# Patient Record
Sex: Female | Born: 1960
Health system: Southern US, Community
[De-identification: ages and names within clinical notes are randomized; demographics above are authoritative.]

## PROBLEM LIST (undated history)

## (undated) DIAGNOSIS — T7840XA Allergy, unspecified, initial encounter: Secondary | ICD-10-CM

## (undated) DIAGNOSIS — K589 Irritable bowel syndrome without diarrhea: Secondary | ICD-10-CM

## (undated) DIAGNOSIS — J3089 Other allergic rhinitis: Secondary | ICD-10-CM

## (undated) DIAGNOSIS — E039 Hypothyroidism, unspecified: Secondary | ICD-10-CM

## (undated) DIAGNOSIS — R002 Palpitations: Secondary | ICD-10-CM

## (undated) DIAGNOSIS — K219 Gastro-esophageal reflux disease without esophagitis: Secondary | ICD-10-CM

## (undated) DIAGNOSIS — E785 Hyperlipidemia, unspecified: Secondary | ICD-10-CM

## (undated) DIAGNOSIS — I2510 Atherosclerotic heart disease of native coronary artery without angina pectoris: Secondary | ICD-10-CM

## (undated) DIAGNOSIS — I10 Essential (primary) hypertension: Secondary | ICD-10-CM

## (undated) DIAGNOSIS — I201 Angina pectoris with documented spasm: Secondary | ICD-10-CM

## (undated) DIAGNOSIS — N83201 Unspecified ovarian cyst, right side: Secondary | ICD-10-CM

## (undated) HISTORY — DX: Hyperlipidemia, unspecified: E78.5

## (undated) HISTORY — PX: BREAST EXCISIONAL BIOPSY: SUR124

## (undated) HISTORY — DX: Palpitations: R00.2

## (undated) HISTORY — DX: Atherosclerotic heart disease of native coronary artery without angina pectoris: I25.10

## (undated) HISTORY — DX: Other allergic rhinitis: J30.89

## (undated) HISTORY — DX: Gastro-esophageal reflux disease without esophagitis: K21.9

## (undated) HISTORY — DX: Allergy, unspecified, initial encounter: T78.40XA

## (undated) HISTORY — PX: INTRACAPSULAR CATARACT EXTRACTION: SHX361

---
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
## 1980-08-30 HISTORY — PX: CHOLECYSTECTOMY: SHX55

## 2000-08-30 HISTORY — PX: BREAST SURGERY: SHX581

## 2008-09-23 LAB — HM PAP SMEAR: HM Pap smear: NEGATIVE

## 2009-08-30 HISTORY — PX: CARDIAC CATHETERIZATION: SHX172

## 2010-02-25 LAB — HM COLONOSCOPY: HM Colonoscopy: NORMAL

## 2010-03-27 LAB — HM MAMMOGRAPHY: HM Mammogram: NEGATIVE

## 2010-06-03 ENCOUNTER — Ambulatory Visit: Payer: Self-pay | Admitting: Cardiology

## 2010-06-03 ENCOUNTER — Encounter: Admission: RE | Admit: 2010-06-03 | Discharge: 2010-06-03 | Payer: Self-pay | Admitting: Cardiology

## 2010-10-14 ENCOUNTER — Ambulatory Visit (INDEPENDENT_AMBULATORY_CARE_PROVIDER_SITE_OTHER): Payer: PRIVATE HEALTH INSURANCE | Admitting: Cardiology

## 2010-10-14 DIAGNOSIS — R059 Cough, unspecified: Secondary | ICD-10-CM

## 2010-10-14 DIAGNOSIS — Z79899 Other long term (current) drug therapy: Secondary | ICD-10-CM

## 2010-10-14 DIAGNOSIS — R05 Cough: Secondary | ICD-10-CM

## 2010-10-14 DIAGNOSIS — E7800 Pure hypercholesterolemia, unspecified: Secondary | ICD-10-CM

## 2010-11-26 ENCOUNTER — Encounter: Payer: Self-pay | Admitting: Family Medicine

## 2010-11-26 ENCOUNTER — Ambulatory Visit (INDEPENDENT_AMBULATORY_CARE_PROVIDER_SITE_OTHER): Payer: PRIVATE HEALTH INSURANCE | Admitting: Family Medicine

## 2010-11-26 VITALS — BP 120/80 | HR 100 | Temp 98.6°F | Resp 12 | Ht 63.0 in | Wt 163.0 lb

## 2010-11-26 DIAGNOSIS — R1013 Epigastric pain: Secondary | ICD-10-CM

## 2010-11-26 NOTE — Progress Notes (Signed)
  Subjective:    Patient ID: Caroline [name], female    DOB: [birth year], 50 y.o.   MRN: [id number]  HPI New patient to establish care. The family just moved here from [place], [place] area. Past medical history significant for GERD and hyperlipidemia. She has already established with local cardiologist.  She had atypical type chest discomfort last summer and ended up with heart catheterization which showed no critical obstructive lesions. Initiated on Crestor but apparently had side effects. Recently placed on Lipitor. Recent lipids reportedly by cardiologist normal. Patient has ongoing nicotine use. Her mother reportedly had heart disease.  Patient complains of some chronic dyspepsia and intermittent midepigastric pains. She was placed on 2 concommitant proton pump inhibitors by previous primary care.  Has some continued midepigastric pain and substernal discomfort with omeprazole and Protonix. This is nonexertional. Location is substernal. No radiation. Symptoms occasionally exacerbated by spicy foods or acidic foods. She has not had any melena, appetite, or weight changes. No odynophagia or dysphagia. She gives some vague history of possible remote peptic ulcer many years ago. She's never had any gastrointestinal evaluation previously.  She has had previous cholecystectomy.  Patient complains of some intermittent myalgias. These preceded her being placed on statin medication. She has not had a complete physical in some time and needs to schedule.   Review of Systems  Constitutional: Positive for fatigue. Negative for fever, chills, activity change, appetite change and unexpected weight change.  Respiratory: Negative for cough, shortness of breath and wheezing.   Cardiovascular: Negative for chest pain.  Gastrointestinal: Positive for abdominal pain and abdominal distention. Negative for vomiting, diarrhea, constipation and blood in stool.  Genitourinary: Negative for dysuria.    Musculoskeletal: Negative for back pain.  Skin: Negative for rash.  Neurological: Negative for dizziness and syncope.  Psychiatric/Behavioral: Negative for dysphoric mood.       Objective:   Physical Exam  Constitutional: She is oriented to person, place, and time. She appears well-developed and well-nourished. No distress.  HENT:  Head: Normocephalic and atraumatic.  Right Ear: External ear normal.  Left Ear: External ear normal.  Mouth/Throat: No oropharyngeal exudate.  Eyes: Pupils are equal, round, and reactive to light.  Neck: Neck supple.  Cardiovascular: Normal rate, regular rhythm and normal heart sounds.   No murmur heard. Pulmonary/Chest: Effort normal and breath sounds normal. She has no wheezes. She has no rales.  Abdominal: Soft. Bowel sounds are normal. There is tenderness.       Minimal midepigastric tenderness to palpation. No hepatosplenomegaly noted no guarding or rebound. No masses.  Musculoskeletal: She exhibits no edema.  Lymphadenopathy:    She has no cervical adenopathy.  Neurological: She is alert and oriented to person, place, and time.  Skin: No rash noted.  Psychiatric: She has a normal mood and affect.          Assessment & Plan:  #1 chronic dyspepsia and intermittent midepigastric pains in a patient on 2 separate proton pump inhibitors. Recommend gastroenterology assessment. She does not take any nonsteroidals other than baby aspirin which she was apparently placed on by cardiologist #2 hyperlipidemia #3 history of reported GERD #4 ongoing nicotine use

## 2010-11-27 ENCOUNTER — Encounter (INDEPENDENT_AMBULATORY_CARE_PROVIDER_SITE_OTHER): Payer: Self-pay | Admitting: *Deleted

## 2010-12-01 NOTE — Letter (Signed)
Summary: New Patient letter  [facility]  [address]   [place], [place] [address]   Phone: [phone]  Fax: [phone]       11/27/2010 MRN: [id number]  [facility] [address] [place], [place]  [address]  [place]  Dear Ms. Osentoski,  Welcome to the Gastroenterology Division at [facility].    You are scheduled to see Dr.  Leone Payor on 12/03/2010 at 2:45 on the 3rd floor at [facility], [address]  We ask that you try to arrive at our office 15 minutes prior to your appointment time to allow for check-in.  We would like you to complete the enclosed self-administered evaluation form prior to your visit and bring it with you on the day of your appointment.  We will review it with you.  Also, please bring a complete list of all your medications or, if you prefer, bring the medication bottles and we will list them.  Please bring your insurance card so that we may make a copy of it.  If your insurance requires a referral to see a specialist, please bring your referral form from your primary care physician.  Co-payments are due at the time of your visit and may be paid by cash, check or credit card.     Your office visit will consist of a consult with your physician (includes a physical exam), any laboratory testing he/she may order, scheduling of any necessary diagnostic testing (e.g. x-ray, ultrasound, CT-scan), and scheduling of a procedure (e.g. Endoscopy, Colonoscopy) if required.  Please allow enough time on your schedule to allow for any/all of these possibilities.    If you cannot keep your appointment, please call [phone] to cancel or reschedule prior to your appointment date.  This allows [place] the opportunity to schedule an appointment for another patient in need of care.  If you do not cancel or reschedule by 5 p.m. the business day prior to your appointment date, you will be charged a $50.00 late cancellation/no-show fee.    Thank you for  choosing [facility] for your medical needs.  We appreciate the opportunity to care for you.  Please visit [place] at our website  to learn more about our practice.                     Sincerely,                                                             The Gastroenterology Division

## 2010-12-03 ENCOUNTER — Other Ambulatory Visit (INDEPENDENT_AMBULATORY_CARE_PROVIDER_SITE_OTHER): Payer: PRIVATE HEALTH INSURANCE

## 2010-12-03 ENCOUNTER — Ambulatory Visit (INDEPENDENT_AMBULATORY_CARE_PROVIDER_SITE_OTHER): Payer: PRIVATE HEALTH INSURANCE | Admitting: Internal Medicine

## 2010-12-03 ENCOUNTER — Encounter: Payer: Self-pay | Admitting: Internal Medicine

## 2010-12-03 VITALS — BP 112/68 | HR 100 | Ht 64.0 in | Wt 164.0 lb

## 2010-12-03 DIAGNOSIS — R1013 Epigastric pain: Secondary | ICD-10-CM

## 2010-12-03 DIAGNOSIS — R197 Diarrhea, unspecified: Secondary | ICD-10-CM

## 2010-12-03 LAB — CBC WITH DIFFERENTIAL/PLATELET
Basophils Relative: 0.6 % (ref 0.0–3.0)
Eosinophils Relative: 6 % — ABNORMAL HIGH (ref 0.0–5.0)
Hemoglobin: 14.9 g/dL (ref 12.0–15.0)
Lymphocytes Relative: 33.9 % (ref 12.0–46.0)
MCHC: 34.2 g/dL (ref 30.0–36.0)
Neutro Abs: 4.2 10*3/uL (ref 1.4–7.7)
Neutrophils Relative %: 51.5 % (ref 43.0–77.0)
RBC: 4.48 Mil/uL (ref 3.87–5.11)
WBC: 8.2 10*3/uL (ref 4.5–10.5)

## 2010-12-03 LAB — COMPREHENSIVE METABOLIC PANEL
ALT: 28 U/L (ref 0–35)
AST: 26 U/L (ref 0–37)
BUN: 20 mg/dL (ref 6–23)
CO2: 30 mEq/L (ref 19–32)
Calcium: 8.7 mg/dL (ref 8.4–10.5)
Chloride: 102 mEq/L (ref 96–112)
Creatinine, Ser: 0.7 mg/dL (ref 0.4–1.2)
GFR: 98.5 mL/min (ref >60.00)
Total Bilirubin: 0.7 mg/dL (ref 0.3–1.2)

## 2010-12-03 NOTE — Patient Instructions (Signed)
You have been scheduled for a CT scan of the abdomen. Separate instruction sheet given. Get your labs drawn today in the basement.  We will obtain records from [facility].

## 2010-12-04 ENCOUNTER — Encounter: Payer: Self-pay | Admitting: Internal Medicine

## 2010-12-04 DIAGNOSIS — R197 Diarrhea, unspecified: Secondary | ICD-10-CM | POA: Insufficient documentation

## 2010-12-04 DIAGNOSIS — R1013 Epigastric pain: Secondary | ICD-10-CM | POA: Insufficient documentation

## 2010-12-04 NOTE — Assessment & Plan Note (Signed)
This is not seen chronic but relatively transient. Alcohol could do this. She had some limited rectal bleeding after multiple loose stools. On rectal exam today there were no blue is no blood though there were some obvious external hemorrhoids seen. Suspect she had some hemorrhoidal bleeding. We will monitor the diarrhea and await the lab and CT studies and review the colonoscopy report.

## 2010-12-04 NOTE — Progress Notes (Signed)
50 year old married white woman with intermittent but persistent and now frequent epigastric pain for about a year. He describes a very intense pain that may occur after eating with an overall dull pain in the background. She has taken PPI therapy with pantoprazole and omeprazole taking twice a day and 3 times a day dosing regimens without relief. She previously lived in [place] and was seen by her PCP there and underwent cardiac workup with negative cardiac catheterization and was placed on lipid therapy. She has had persistent problems since moving to this area. She has never had an endoscopy that she relates a colonoscopy for screening in 2010 there was apparently unremarkable.  She also describes a fair amount of bloating and gas and belching but she is not vomiting. She thinks she might have IBS. Lately she's had some spells of loose stools and after a protracted episode started to see some rectal bleeding on the toilet paper and in the bowl. Ingestion of salads and some fatty foods will cause diarrhea. She notes no incontinence no weight loss. She is not describing classic heartburn necessarily is more of an epigastric pain as described above. He has used TUMS without relief as well. The remainder of her GI review of systems is negative. She uses Aleve perhaps a couple times a month but no regular NSAID or aspirin use outside the 81 mg dose of aspirin. Past Medical History  Diagnosis Date  . GERD (gastroesophageal reflux disease)   . Hyperlipidemia    Past Surgical History  Procedure Date  . Cholecystectomy 1982  . Appendectomy 1976  . Breast surgery 2002    biopsy  . Cardiac catheterization 2011    ok, in [place]    reports that she has been smoking Cigarettes.  She has a 15 pack-year smoking history. She has never used smokeless tobacco. She reports that she drinks alcohol. She reports that she does not use illicit drugs. family history includes Breast cancer (age of onset:36) in her  mother; Colon cancer in an unspecified family member; Diabetes in her mother; Heart disease in her father and mother; Hyperlipidemia in her father; and Prostate cancer in her father. No Known Allergies    Current outpatient prescriptions:aspirin 81 MG tablet, Take 81 mg by mouth daily.  , Disp: , Rfl: ;  atorvastatin (LIPITOR) 10 MG tablet, Take 10 mg by mouth daily.  , Disp: , Rfl: ;  naproxen sodium (ALEVE) 220 MG tablet, Take 220 mg by mouth. Once a week as needed for headaches and will take 400 mg , Disp: , Rfl: ;  omeprazole (PRILOSEC) 20 MG capsule, Take 20 mg by mouth daily. Will take once a day and sometimes twice, Disp: , Rfl:  pantoprazole (PROTONIX) 40 MG tablet, Take 40 mg by mouth daily.  , Disp: , Rfl:   ROS: Insomnia night sweats fatigue are noted all other systems negative  PE: Overweight middle-age white woman no acute distress Eyes are anicteric pupils round and reactive to light  mouth posterior pharynx are free of lesions Neck is supple without thyromegaly or mass Lungs are clear Heart S1-S2 no rubs murmurs or gallops are heard no jugular venous distention The abdomen is soft mildly obese bowel sounds are present, there is moderate tenderness with some guarding in the epigastric area without fullness or mass no hepatosplenomegaly or hernia detected Extremities free of edema no cyanosis or clubbing Skin warm and dry no acute rash Neuro alert and oriented x3 grossly nonfocal Psych talkative somewhat anxious perhaps  otherwise appropriate mood and affect   Lab Results  Component Value Date   WBC 8.2 12/03/2010   HGB 14.9 12/03/2010   HCT 43.6 12/03/2010   MCV 97.3 12/03/2010   PLT 209.0 12/03/2010   Lab Results  Component Value Date   AMYLASE 53 12/03/2010   Lab Results  Component Value Date   LIPASE 43.0 12/03/2010     Chemistry      Component Value Date/Time   NA 141 12/03/2010 1614   K 4.7 12/03/2010 1614   CL 102 12/03/2010 1614   CO2 30 12/03/2010 1614   BUN 20 12/03/2010  1614   CREATININE 0.7 12/03/2010 1614      Component Value Date/Time   CALCIUM 8.7 12/03/2010 1614   ALKPHOS 66 12/03/2010 1614   AST 26 12/03/2010 1614   ALT 28 12/03/2010 1614   BILITOT 0.7 12/03/2010 1614

## 2010-12-04 NOTE — Assessment & Plan Note (Signed)
One-year history of epigastric pain not responding to PPI therapy. Etiology not clear, but it does not seem to be cardiac. She is tender in the epigastrium as well. Given all that we'll start with CT abdomen and laboratory studies. My staff indicated the patient smelled of alcohol but I did not appreciate today. She reported 4-5 drinks per week. This in mind as we reviewed things. Depending upon what the CT shows, upper GI endoscopy could be needed. The patient is aware of this including the risks benefits indications.

## 2010-12-04 NOTE — Progress Notes (Signed)
Quick Note:  Labs are fine, will report when CT results in ______

## 2010-12-09 ENCOUNTER — Ambulatory Visit (INDEPENDENT_AMBULATORY_CARE_PROVIDER_SITE_OTHER)
Admission: RE | Admit: 2010-12-09 | Discharge: 2010-12-09 | Disposition: A | Discharge status: Home / Self Care | Payer: PRIVATE HEALTH INSURANCE | Source: Ambulatory Visit | Attending: Internal Medicine | Admitting: Internal Medicine

## 2010-12-09 DIAGNOSIS — R1013 Epigastric pain: Secondary | ICD-10-CM

## 2010-12-09 MED ORDER — IOHEXOL 300 MG/ML  SOLN
80.0000 mL | Freq: Once | INTRAMUSCULAR | Status: AC | PRN
  Administered 2010-12-09: 80 mL via INTRAVENOUS

## 2010-12-10 ENCOUNTER — Other Ambulatory Visit (INDEPENDENT_AMBULATORY_CARE_PROVIDER_SITE_OTHER): Payer: PRIVATE HEALTH INSURANCE | Admitting: Family Medicine

## 2010-12-10 DIAGNOSIS — Z0000 Encounter for general adult medical examination without abnormal findings: Secondary | ICD-10-CM

## 2010-12-10 LAB — LIPID PANEL
Cholesterol: 192 mg/dL (ref 0–200)
HDL: 80.3 mg/dL (ref >39.00)
LDL Cholesterol: 101 mg/dL — ABNORMAL HIGH (ref 0–99)
Total CHOL/HDL Ratio: 2
Triglycerides: 55 mg/dL (ref 0.0–149.0)
VLDL: 11 mg/dL (ref 0.0–40.0)

## 2010-12-10 LAB — POCT URINALYSIS DIPSTICK
Blood, UA: NEGATIVE
Nitrite, UA: NEGATIVE
Protein, UA: NEGATIVE
Spec Grav, UA: 1.02
Urobilinogen, UA: 0.2
pH, UA: 6.5

## 2010-12-11 ENCOUNTER — Telehealth: Payer: Self-pay | Admitting: Internal Medicine

## 2010-12-11 NOTE — Telephone Encounter (Signed)
Patient calling for CT scan results from 12/09/10.

## 2010-12-12 NOTE — Progress Notes (Signed)
Quick Note:  Ct is ok Will arrange egd See telephone note ______

## 2010-12-12 NOTE — Telephone Encounter (Signed)
Ct is normal Next step is egd for the epigastric pain

## 2010-12-14 ENCOUNTER — Other Ambulatory Visit: Payer: Self-pay | Admitting: *Deleted

## 2010-12-14 ENCOUNTER — Encounter: Payer: Self-pay | Admitting: Internal Medicine

## 2010-12-14 ENCOUNTER — Encounter: Payer: Self-pay | Admitting: *Deleted

## 2010-12-14 DIAGNOSIS — R1013 Epigastric pain: Secondary | ICD-10-CM

## 2010-12-14 NOTE — Telephone Encounter (Signed)
Patient notified of results. Scheduled EGD on 5/412 at 2:30 PM with 1:30 PM arrival. Pre visit scheduled on 12/22/10 at 8:30 AM.

## 2010-12-22 ENCOUNTER — Ambulatory Visit (AMBULATORY_SURGERY_CENTER): Payer: PRIVATE HEALTH INSURANCE | Admitting: *Deleted

## 2010-12-22 VITALS — Ht 64.5 in | Wt 167.6 lb

## 2010-12-22 DIAGNOSIS — R1013 Epigastric pain: Secondary | ICD-10-CM

## 2010-12-23 ENCOUNTER — Encounter: Payer: Self-pay | Admitting: Family Medicine

## 2010-12-23 ENCOUNTER — Ambulatory Visit (INDEPENDENT_AMBULATORY_CARE_PROVIDER_SITE_OTHER): Payer: PRIVATE HEALTH INSURANCE | Admitting: Family Medicine

## 2010-12-23 VITALS — BP 130/78 | HR 80 | Temp 97.9°F | Resp 12 | Ht 62.5 in | Wt 166.0 lb

## 2010-12-23 DIAGNOSIS — F17200 Nicotine dependence, unspecified, uncomplicated: Secondary | ICD-10-CM

## 2010-12-23 DIAGNOSIS — Z559 Problems related to education and literacy, unspecified: Secondary | ICD-10-CM

## 2010-12-23 DIAGNOSIS — Z0000 Encounter for general adult medical examination without abnormal findings: Secondary | ICD-10-CM

## 2010-12-23 MED ORDER — TETANUS-DIPHTH-ACELL PERTUSSIS 5-2.5-18.5 LF-MCG/0.5 IM SUSP: 0.5000 mL | Freq: Once | INTRAMUSCULAR | Status: DC

## 2010-12-23 NOTE — Patient Instructions (Signed)
Smoking Cessation This document explains the best ways for you to quit smoking and new treatments to help. It lists new medicines that can double or triple your chances of quitting and quitting for good. It also considers ways to avoid relapses and concerns you may have about quitting, including weight gain. NICOTINE: A POWERFUL ADDICTION If you have tried to quit smoking, you know how hard it can be. It is hard because nicotine is a very addictive drug. For some people, it can be as addictive as heroin or cocaine. Usually, people make 2 or 3 tries, or more, before finally being able to quit. Each time you try to quit, you can learn about what helps and what hurts. Quitting takes hard work and a lot of effort, but you can quit smoking. QUITTING SMOKING IS ONE OF THE MOST IMPORTANT THINGS YOU WILL EVER DO:  You will live longer, feel better, and live better.   The impact on your body of quitting smoking is felt almost immediately:   Within 20 minutes, blood pressure decreases. Pulse returns to its normal level.   After 8 hours, carbon monoxide levels in the blood return to normal. Oxygen level increases.   After 24 hours, chance of heart attack starts to decrease. Breath, hair, and body stop smelling like smoke.   After 48 hours, damaged nerve endings begin to recover. Sense of taste and smell improve.   After 72 hours, the body is virtually free of nicotine. Bronchial tubes relax and breathing becomes easier.   After 2 to 12 weeks, lungs can hold more air. Exercise becomes easier and circulation improves.   Quitting will lower your chance of having a heart attack, stroke, cancer, or lung disease:   After 1 year, the risk of coronary heart disease is cut in half.   After 5 years, the risk of stroke falls to the same as a nonsmoker.   After 10 years, the risk of lung cancer is cut in half and the risk of other cancers decreases significantly.   After 15 years, the risk of coronary heart  disease drops, usually to the level of a nonsmoker.   If you are pregnant, quitting smoking will improve your chances of having a healthy baby.   The people you live with, especially your children, will be healthier.   You will have extra money to spend on things other than cigarettes.  FIVE KEYS TO QUITTING Studies have shown that these 5 steps will help you quit smoking and quit for good. You have the best chances of quitting if you use them together: 1. Get ready.  2. Get support and encouragement.  3. Learn new skills and behaviors.  4. Get medicine to reduce your nicotine addiction and use it correctly.  5. Be prepared for relapse or difficult situations. Be determined to continue trying to quit, even if you do not succeed at first.  1. GET READY  Set a quit date.   Change your environment.   Get rid of ALL cigarettes, ashtrays, matches, and lighters in your home, car, and place of work.   Do not let people smoke in your home.   Review your past attempts to quit. Think about what worked and what did not.   Once you quit, do not smoke. NOT EVEN A PUFF!  2. GET SUPPORT AND ENCOURAGEMENT Studies have shown that you have a better chance of being successful if you have help. You can get support in many ways.  Tell  your family, friends, and coworkers that you are going to quit and need their support. Ask them not to smoke around you.   Talk to your caregivers (doctor, dentist, nurse, pharmacist, psychologist, and/or smoking counselor).   Get individual, group, or telephone counseling and support. The more counseling you have, the better your chances are of quitting. Programs are available at local hospitals and health centers. Call your local health department for information about programs in your area.   Spiritual beliefs and practices may help some smokers quit.   Quit meters are small computer programs online or downloadable that keep track of quit statistics, such as amount  of "quit-time," cigarettes not smoked, and money saved.   Many smokers find one or more of the many self-help books available useful in helping them quit and stay off tobacco.  3. LEARN NEW SKILLS AND BEHAVIORS  Try to distract yourself from urges to smoke. Talk to someone, go for a walk, or occupy your time with a task.   When you first try to quit, change your routine. Take a different route to work. Drink tea instead of coffee. Eat breakfast in a different place.   Do something to reduce your stress. Take a hot bath, exercise, or read a book.   Plan something enjoyable to do every day. Reward yourself for not smoking.   Explore interactive web-based programs that specialize in helping you quit.  4. GET MEDICINE AND USE IT CORRECTLY Medicines can help you stop smoking and decrease the urge to smoke. Combining medicine with the above behavioral methods and support can quadruple your chances of successfully quitting smoking. The U.S. Food and Drug Administration (FDA) has approved 7 medicines to help you quit smoking. These medicines fall into 3 categories.  Nicotine replacement therapy (delivers nicotine to your body without the negative effects and risks of smoking):   Nicotine gum: Available over-the-counter.   Nicotine lozenges: Available over-the-counter.   Nicotine inhaler: Available by prescription.   Nicotine nasal spray: Available by prescription.   Nicotine skin patches (transdermal): Available by prescription and over-the-counter.   Antidepressant medicine (helps people abstain from smoking, but how this works is unknown):   Bupropion sustained-release (SR) tablets: Available by prescription.   Nicotinic receptor partial agonist (simulates the effect of nicotine in your brain):   Varenicline tartrate tablets: Available by prescription.   Ask your caregiver for advice about which medicines to use and how to use them. Carefully read the information on the package.    Everyone who is trying to quit may benefit from using a medicine. If you are pregnant or trying to become pregnant, nursing an infant, you are under age 18, or you smoke fewer than 10 cigarettes per day, talk to your caregiver before taking any nicotine replacement medicines.   You should stop using a nicotine replacement product and call your caregiver if you experience nausea, dizziness, weakness, vomiting, fast or irregular heartbeat, mouth problems with the lozenge or gum, or redness or swelling of the skin around the patch that does not go away.   Do not use any other product containing nicotine while using a nicotine replacement product.   Talk to your caregiver before using these products if you have diabetes, heart disease, asthma, stomach ulcers, you had a recent heart attack, you have high blood pressure that is not controlled with medicine, a history of irregular heartbeat, or you have been prescribed medicine to help you quit smoking.  5. BE PREPARED FOR RELAPSE OR   DIFFICULT SITUATIONS  Most relapses occur within the first 3 months after quitting. Do not be discouraged if you start smoking again. Remember, most people try several times before they finally quit.   You may have symptoms of withdrawal because your body is used to nicotine. You may crave cigarettes, be irritable, feel very hungry, cough often, get headaches, or have difficulty concentrating.   The withdrawal symptoms are only temporary. They are strongest when you first quit, but they will go away within 10 to 14 days.  Here are some difficult situations to watch for:  Alcohol. Avoid drinking alcohol. Drinking lowers your chances of successfully quitting.   Caffeine. Try to reduce the amount of caffeine you consume. It also lowers your chances of successfully quitting.   Other smokers. Being around smoking can make you want to smoke. Avoid smokers.   Weight gain. Many smokers will gain weight when they quit, usually  less than 10 pounds. Eat a healthy diet and stay active. Do not let weight gain distract you from your main goal, quitting smoking. Some medicines that help you quit smoking may also help delay weight gain. You can always lose the weight gained after you quit.   Bad mood or depression. There are a lot of ways to improve your mood other than smoking.  If you are having problems with any of these situations, talk to your caregiver. SPECIAL SITUATIONS OR CONDITIONS Studies suggest that everyone can quit smoking. Your situation or condition can give you a special reason to quit.  Pregnant women/New mothers: By quitting, you protect your baby's health and your own.   Hospitalized patients: By quitting, you reduce health problems and help healing.   Heart attack patients: By quitting, you reduce your risk of a second heart attack.   Lung, head, and neck cancer patients: By quitting, you reduce your chance of a second cancer.   Parents of children and adolescents: By quitting, you protect your children from illnesses caused by secondhand smoke.  QUESTIONS TO THINK ABOUT Think about the following questions before you try to stop smoking. You may want to talk about your answers with your caregiver.  Why do you want to quit?   If you tried to quit in the past, what helped and what did not?   What will be the most difficult situations for you after you quit? How will you plan to handle them?   Who can help you through the tough times? Your family? Friends? Caregiver?   What pleasures do you get from smoking? What ways can you still get pleasure if you quit?  Here are some questions to ask your caregiver:  How can you help me to be successful at quitting?   What medicine do you think would be best for me and how should I take it?   What should I do if I need more help?   What is smoking withdrawal like? How can I get information on withdrawal?  Quitting takes hard work and a lot of effort,  but you can quit smoking. FOR MORE INFORMATION Smokefree.gov (http://www.davis-sullivan.com/) provides free, accurate, evidence-based information and professional assistance to help support the immediate and long-term needs of people trying to quit smoking. Document Released: 08/10/2001 Document Re-Released: 02/03/2010 [facility] Patient Information 2011 [place], [place].  Remember yearly mammogram and set up with Gyn for pap smear.

## 2010-12-23 NOTE — Progress Notes (Signed)
  Subjective:    Patient ID: Caroline [name], female    DOB: 02-Feb-1961, 50 y.o.   MRN: [id number]  HPI Pt here for CPE.  She plans to set up appt with gyn. Last tetanus unknown. Patient had colonoscopy last year. Has been scheduled for EGD given her continued epigastric pains off and on. Recent CT scan unremarkable. Patient still smokes but is trying to quit and has fairly high motivation for quitting   Review of Systems  Constitutional: Positive for appetite change. Negative for fever, activity change and fatigue.  HENT: Negative for hearing loss, ear pain, sore throat and trouble swallowing.   Eyes: Negative for visual disturbance.  Respiratory: Negative for cough and shortness of breath.   Cardiovascular: Negative for chest pain and palpitations.  Gastrointestinal: Positive for abdominal pain and blood in stool. Negative for nausea, vomiting, diarrhea, constipation and rectal pain.  Genitourinary: Negative for dysuria and hematuria.  Musculoskeletal: Negative for myalgias, back pain and arthralgias.  Skin: Negative for rash.  Neurological: Negative for dizziness, syncope and headaches.  Hematological: Negative for adenopathy.  Psychiatric/Behavioral: Negative for confusion and dysphoric mood.       Objective:   Physical Exam  Constitutional: She is oriented to person, place, and time. She appears well-developed and well-nourished.  HENT:  Head: Normocephalic and atraumatic.  Eyes: EOM are normal. Pupils are equal, round, and reactive to light.  Neck: Normal range of motion. Neck supple. No thyromegaly present.  Cardiovascular: Normal rate, regular rhythm and normal heart sounds.   No murmur heard. Pulmonary/Chest: Breath sounds normal. No respiratory distress. She has no wheezes. She has no rales.  Abdominal: Soft. Bowel sounds are normal. She exhibits no distension and no mass. There is no tenderness. There is no rebound and no guarding.  Musculoskeletal: Normal range of motion.  She exhibits no edema.  Lymphadenopathy:    She has no cervical adenopathy.  Neurological: She is alert and oriented to person, place, and time. She displays normal reflexes. No cranial nerve deficit.  Skin: No rash noted.  Psychiatric: She has a normal mood and affect. Her behavior is normal. Judgment and thought content normal.          Assessment & Plan:  Complete physical examination. Labs reviewed with patient. Tetanus booster given. She'll establish with local gynecologist. Continue yearly mammogram. Discussed smoking cessation strategies. She plans to quit on her own at this time.

## 2011-01-01 ENCOUNTER — Other Ambulatory Visit: Payer: PRIVATE HEALTH INSURANCE | Admitting: Internal Medicine

## 2011-01-08 ENCOUNTER — Encounter: Payer: Self-pay | Admitting: Internal Medicine

## 2011-01-11 ENCOUNTER — Ambulatory Visit (AMBULATORY_SURGERY_CENTER): Payer: PRIVATE HEALTH INSURANCE | Admitting: Internal Medicine

## 2011-01-11 ENCOUNTER — Encounter: Payer: Self-pay | Admitting: Internal Medicine

## 2011-01-11 DIAGNOSIS — D131 Benign neoplasm of stomach: Secondary | ICD-10-CM

## 2011-01-11 DIAGNOSIS — K209 Esophagitis, unspecified without bleeding: Secondary | ICD-10-CM

## 2011-01-11 DIAGNOSIS — R933 Abnormal findings on diagnostic imaging of other parts of digestive tract: Secondary | ICD-10-CM

## 2011-01-11 DIAGNOSIS — K219 Gastro-esophageal reflux disease without esophagitis: Secondary | ICD-10-CM

## 2011-01-11 DIAGNOSIS — K2970 Gastritis, unspecified, without bleeding: Secondary | ICD-10-CM

## 2011-01-11 DIAGNOSIS — R1013 Epigastric pain: Secondary | ICD-10-CM

## 2011-01-11 HISTORY — PX: UPPER GASTROINTESTINAL ENDOSCOPY: SHX188

## 2011-01-11 MED ORDER — SODIUM CHLORIDE 0.9 % IV SOLN: 500.0000 mL | INTRAVENOUS | Status: DC

## 2011-01-11 MED ORDER — DEXLANSOPRAZOLE 60 MG PO CPDR: 60.0000 mg | DELAYED_RELEASE_CAPSULE | Freq: Every day | ORAL | Status: DC

## 2011-01-11 NOTE — Patient Instructions (Signed)
[employer] reviewed with patient and care partner.  [employer] discharge instructions signed by care partner.  Impressions/Findings:  Esophagitis (handouts given)  Gastritis (handouts given)  Stop pantoprazole and Prilosec and try Dexilant 60 mg every am 30 minutes before breakfast.

## 2011-01-12 ENCOUNTER — Telehealth: Payer: Self-pay

## 2011-01-12 NOTE — Telephone Encounter (Signed)
I called the pt's home # which was the same # for her cell #.  No message left, no ID on the recording. MAW

## 2011-01-19 NOTE — Progress Notes (Signed)
Quick Note:  Stay on Dexilant for GERD (esophagitis but no gastritis on bxs) and see me July 2012 No recall Letter created ______

## 2011-01-20 ENCOUNTER — Telehealth: Payer: Self-pay | Admitting: Internal Medicine

## 2011-01-20 NOTE — Telephone Encounter (Signed)
Patient says Dexilant is too expensive.  I have sent her some discount cards in the mail.  She should be able to get for a $20 co pay.  She will call back for any further problems.

## 2011-05-01 HISTORY — PX: CARDIAC CATHETERIZATION: SHX172

## 2011-05-13 ENCOUNTER — Emergency Department (HOSPITAL_COMMUNITY): Payer: PRIVATE HEALTH INSURANCE

## 2011-05-13 ENCOUNTER — Observation Stay (HOSPITAL_COMMUNITY)
Admission: EM | Admit: 2011-05-13 | Discharge: 2011-05-14 | Disposition: A | Discharge status: Home / Self Care | Payer: PRIVATE HEALTH INSURANCE | Source: Ambulatory Visit | Attending: Cardiology | Admitting: Cardiology

## 2011-05-13 DIAGNOSIS — I2510 Atherosclerotic heart disease of native coronary artery without angina pectoris: Principal | ICD-10-CM | POA: Insufficient documentation

## 2011-05-13 DIAGNOSIS — I200 Unstable angina: Secondary | ICD-10-CM | POA: Insufficient documentation

## 2011-05-13 DIAGNOSIS — R110 Nausea: Secondary | ICD-10-CM | POA: Insufficient documentation

## 2011-05-13 DIAGNOSIS — R079 Chest pain, unspecified: Secondary | ICD-10-CM

## 2011-05-13 DIAGNOSIS — R61 Generalized hyperhidrosis: Secondary | ICD-10-CM | POA: Insufficient documentation

## 2011-05-13 DIAGNOSIS — E785 Hyperlipidemia, unspecified: Secondary | ICD-10-CM | POA: Insufficient documentation

## 2011-05-13 DIAGNOSIS — F17200 Nicotine dependence, unspecified, uncomplicated: Secondary | ICD-10-CM | POA: Insufficient documentation

## 2011-05-13 DIAGNOSIS — R0602 Shortness of breath: Secondary | ICD-10-CM | POA: Insufficient documentation

## 2011-05-13 LAB — CBC
Hemoglobin: 15.1 g/dL — ABNORMAL HIGH (ref 12.0–15.0)
MCH: 34.2 pg — ABNORMAL HIGH (ref 26.0–34.0)
MCH: 33.5 pg (ref 26.0–34.0)
MCHC: 35.3 g/dL (ref 30.0–36.0)
MCV: 95.1 fL (ref 78.0–100.0)
MCV: 94.9 fL (ref 78.0–100.0)
Platelets: 170 10*3/uL (ref 150–400)
Platelets: 167 10*3/uL (ref 150–400)
RBC: 4.51 MIL/uL (ref 3.87–5.11)
RDW: 13.1 % (ref 11.5–15.5)

## 2011-05-13 LAB — DIFFERENTIAL
Eosinophils Relative: 11 % — ABNORMAL HIGH (ref 0–5)
Lymphocytes Relative: 49 % — ABNORMAL HIGH (ref 12–46)
Neutro Abs: 2.2 10*3/uL (ref 1.7–7.7)

## 2011-05-13 LAB — HEMOGLOBIN A1C
Hgb A1c MFr Bld: 5.7 % — ABNORMAL HIGH (ref <5.7)
Mean Plasma Glucose: 117 mg/dL — ABNORMAL HIGH (ref <117)

## 2011-05-13 LAB — CARDIAC PANEL(CRET KIN+CKTOT+MB+TROPI)
CK, MB: 1.6 ng/mL (ref 0.3–4.0)
Total CK: 86 U/L (ref 7–177)
Troponin I: <0.3 ng/mL (ref <0.30)

## 2011-05-13 LAB — LIPID PANEL
Cholesterol: 201 mg/dL — ABNORMAL HIGH (ref 0–200)
HDL: 69 mg/dL (ref >39)
Total CHOL/HDL Ratio: 2.9 RATIO
VLDL: 18 mg/dL (ref 0–40)

## 2011-05-13 LAB — COMPREHENSIVE METABOLIC PANEL
ALT: 25 U/L (ref 0–35)
CO2: 27 mEq/L (ref 19–32)
Calcium: 9.2 mg/dL (ref 8.4–10.5)
Creatinine, Ser: 0.66 mg/dL (ref 0.50–1.10)
GFR calc Af Amer: >60 mL/min (ref >60)
GFR calc non Af Amer: >60 mL/min (ref >60)
Glucose, Bld: 116 mg/dL — ABNORMAL HIGH (ref 70–99)

## 2011-05-13 LAB — MAGNESIUM: Magnesium: 2 mg/dL (ref 1.5–2.5)

## 2011-05-14 DIAGNOSIS — I200 Unstable angina: Secondary | ICD-10-CM

## 2011-05-14 LAB — CBC
HCT: 38.9 % (ref 36.0–46.0)
Hemoglobin: 13.5 g/dL (ref 12.0–15.0)
MCH: 33.5 pg (ref 26.0–34.0)
MCV: 96.5 fL (ref 78.0–100.0)
RBC: 4.03 MIL/uL (ref 3.87–5.11)

## 2011-05-14 LAB — BASIC METABOLIC PANEL
BUN: 13 mg/dL (ref 6–23)
CO2: 25 mEq/L (ref 19–32)
Calcium: 8.1 mg/dL — ABNORMAL LOW (ref 8.4–10.5)
Creatinine, Ser: 0.66 mg/dL (ref 0.50–1.10)
Glucose, Bld: 93 mg/dL (ref 70–99)
Sodium: 139 mEq/L (ref 135–145)

## 2011-05-15 NOTE — H&P (Signed)
NAME:  Caroline [name] NO.:  [id number]  MEDICAL RECORD NO.:  [id number]  LOCATION:  [facility]                         FACILITY:  [facility]  PHYSICIAN:  Lenon Oms, MD  DATE OF BIRTH:  [birth year]  DATE OF ADMISSION:  05/13/2011 DATE OF DISCHARGE:                             HISTORY & PHYSICAL   CARDIOLOGIST:  Cassell Clement, M.D.  CHIEF COMPLAINT:  Chest pain.  HISTORY OF PRESENT ILLNESS:  Caroline [name] is a 50 year old Caucasian female with past medical history significant for nonobstructive coronary artery disease who presents to the emergency department with a chief complaint of chest pain.  The patient stated that her chest pain began suddenly around 10:00 p.m. at night while getting ready for bed.  The patient reported her pain was a sharp 8-9/10 midsternal pain radiating into her left arm and shoulder.  Chest pain was associated with shortness of breath, diaphoresis, and nausea.  The patient did have nitroglycerin.  However, did not take any at home.  As her symptoms did not improve and was significantly worse than prior episodes of chest pain, she had her husband bring her into the emergency department for further evaluation and management.  She otherwise reports that she has had recent dyspnea on exertion with walking up stairs over the last several weeks, and she did have an appointment to follow-up with Dr. Patty Sermons in July of this year; however, did miss that appointment.  She does continue to smoke tobacco apparently.  PAST MEDICAL HISTORY:  Reported coronary angiography in July 2011 at [facility] by Dr.  Angeline Slim.  I do not have report of this at this time. No angioplasty or stenting was done at that time.  ALLERGIES:  NO KNOWN DRUG ALLERGIES.  MEDICATIONS:  The patient reports that she currently takes: 1. Aspirin 81 mg daily. 2. Lipitor 40 mg daily.  SOCIAL HISTORY:  The patient lives in [place] with her husband and two children.   She works in [employer].  She smokes one half- pack per day for the past 30 years.  She does drink six alcoholic drinks a week.  She denies any illicit drug use.  FAMILY HISTORY:  Her mother died at 27, she had COPD.  Father had coronary disease and CABG.  Her siblings, has a brother who is 43, who had his first coronary disease at 35.  REVIEW OF SYSTEMS:  Other than stated in HPI, all other systems are reviewed and is negative.  PHYSICAL EXAMINATION:  VITAL SIGNS: Temperature 97.8, blood pressure 112/76, pulse 89, respiratory rate 16,  the patient was 97% on room air. GENERAL: She is a pleasant Caucasian female in no acute distress. HEENT: Normocephalic, atraumatic.  Pupils equally round and reactive to light.  Extraocular movements intact.  Anicteric sclera.  NECK:  Supple. No JVD. LUNGS:  Clear to auscultation bilaterally. HEART:  Regular rate and rhythm.  Normal S1-S2. ABDOMEN:  Normoactive bowel sounds, soft, nontender, nondistended. NEUROLOGIC:  Awake, alert, oriented x3.  No focal deficits.  RADIOLOGY:  The patient had a chest x-ray, which revealed bibasilar interstitial prominence without focal consolidation or effusion or pneumothorax.    EKG revealed sinus rhythm with heart  rate of 83, cannot rule out anterior septal infarct.  LABORATORY DATA:  The patient had a CBC profile, which revealed a white count of 7.4, hemoglobin of 16.1, hematocrit of 44.8, platelets of 170. Comprehensive metabolic profile revealed a sodium of 141, potassium 3.6, BUN of 4, creatinine is 0.66, AST is 24, ALT of 25, alk. phos is 75. Troponin 0.  ASSESSMENT/PLAN:  This is a 50 year old Caucasian female with past medical history significant for reported nonobstructive coronary artery disease who presents to emergency department with a chief complaint of chest pain x1 days.  At this time, the patient has a negative troponin. We will admit to telemetry unit and we will monitor serial  cardiac enzymes and rule out for acute myocardial infarction.  We will make n.p.o. at midnight.  Medical management will include aspirin 325 of beta- blocker, statin, and nitro p.r.n.  We will check her lipids, hemoglobin A1c, and TSH.  I have counseled regarding tobacco cessation.  At this time, I did not have report of her prior cardiac catheterization done 1 year prior, however, the patient reports that Dr. Patty Sermons does have these records and is familiar with the patient.  Further management likely to be determined after reassessment in the a.m.  I have discussed this plan in detail with the patient.  I have answered all her questions at this time.          ______________________________ Lenon Oms, MD     PB/MEDQ  D:  05/13/2011  T:  05/13/2011  Job:  [id number]  Electronically Signed by Lenon Oms MD on 05/15/2011 06:01:34 PM

## 2011-05-17 ENCOUNTER — Ambulatory Visit (INDEPENDENT_AMBULATORY_CARE_PROVIDER_SITE_OTHER): Payer: PRIVATE HEALTH INSURANCE | Admitting: Family Medicine

## 2011-05-17 ENCOUNTER — Encounter: Payer: Self-pay | Admitting: Family Medicine

## 2011-05-17 VITALS — BP 140/70 | Temp 98.1°F | Wt 166.0 lb

## 2011-05-17 DIAGNOSIS — E785 Hyperlipidemia, unspecified: Secondary | ICD-10-CM

## 2011-05-17 DIAGNOSIS — H60549 Acute eczematoid otitis externa, unspecified ear: Secondary | ICD-10-CM

## 2011-05-17 DIAGNOSIS — H60509 Unspecified acute noninfective otitis externa, unspecified ear: Secondary | ICD-10-CM

## 2011-05-17 DIAGNOSIS — R079 Chest pain, unspecified: Secondary | ICD-10-CM

## 2011-05-17 MED ORDER — MOMETASONE FUROATE 0.1 % EX SOLN: Freq: Every day | CUTANEOUS | Status: DC

## 2011-05-17 NOTE — Progress Notes (Signed)
  Subjective:    Patient ID: Caroline [name], female    DOB: 1961/06/12, 50 y.o.   MRN: [id number]  HPI Patient seen for hospital followup. She was admitted last Wednesday with chest discomfort. She related substernal chest pressure and had nausea shortness of breath and radiation of pain to the left arm. Symptoms lasted about 15 minutes. Previous catheterization last year in [facility] which showed some plaque but no critical obstructive lesions. Patient ruled out for MI. Her catheterization reportedly did not show any progressive lesions. Consideration of possible coronary vasospasm. Addition of diltiazem and isosorbide.  No chest pain at this time. She has had some headaches since discharge but these are relatively mild. She also complains of some intermittent right hand numbness and no weakness. No neck pain. Mild ache right knee but no leg swelling and no hematoma from catheterization.    Intermittent eczematous rash right external ear canal. Pruritic. No drainage. No hearing changes. Has not tried any prescription medications   Review of Systems  Constitutional: Negative for appetite change and unexpected weight change.  Respiratory: Negative for cough and shortness of breath.   Cardiovascular: Negative for chest pain, palpitations and leg swelling.  Gastrointestinal: Negative for abdominal pain.  Neurological: Positive for headaches. Negative for dizziness and weakness.       Objective:   Physical Exam  Constitutional: She is oriented to person, place, and time. She appears well-developed and well-nourished.  HENT:  Mouth/Throat: Oropharynx is clear and moist.       Left external ear canal is clear. She has dry scaly eczematous rash right canal. Eardrums are normal  Eyes: Pupils are equal, round, and reactive to light.  Neck: Neck supple. No thyromegaly present.  Cardiovascular: Normal rate and regular rhythm.   Pulmonary/Chest: Effort normal and breath sounds normal. No  respiratory distress. She has no wheezes. She has no rales.  Musculoskeletal: She exhibits no edema.  Lymphadenopathy:    She has no cervical adenopathy.  Neurological: She is alert and oriented to person, place, and time.  Psychiatric: She has a normal mood and affect. Her behavior is normal.          Assessment & Plan:  #1 recent chest pain with catheterization. Question of coronary vasospasm. Patient has mild headache with isosorbide and we explained this may reduce after a few days. She has scheduled followup with cardiologist.  She will notify cardiologist if headache not improving next few days. #2 hyperlipidemia. Lipitor increased to 20 mg during hospitalization. Schedule followup lipids and hepatic in 2 months #3 eczema right ear canal. Elocon lotion once daily as needed

## 2011-05-21 NOTE — Discharge Summary (Signed)
NAME:  Caroline [name], CLECKLEY NO.:  [id number]  MEDICAL RECORD NO.:  [id number]  LOCATION:  3731                         FACILITY:  [facility]  PHYSICIAN:  Cassell Clement, M.D. DATE OF BIRTH:  10/05/1960  DATE OF ADMISSION:  05/13/2011 DATE OF DISCHARGE:  05/14/2011                              DISCHARGE SUMMARY   PRIMARY CARDIOLOGIST:  Cassell Clement, MD  PRIMARY CARE PROVIDER:  Evelena Peat, MD  DISCHARGE DIAGNOSIS:  Unstable angina with documented coronary artery vasospasm.  SECONDARY DIAGNOSES: 1. Nonobstructive coronary artery disease by catheterization this     admission. 2. Hyperlipidemia. 3. Ongoing tobacco abuse.  ALLERGIES:  No known drug allergies.  PROCEDURES:  Left heart cardiac catheterization initially revealing 70% proximal stenosis in the LAD, 80% proximal stenosis in the left circumflex, 70%-80% proximal stenosis in the first diagonal, 30% calcified stenoses in the right coronary artery with an EF of 60%.  The patient was treated with intracoronary nitroglycerin with reduction of proximal LAD stenosis to 40%, and reduction of proximal circumflex stenosis to 30%, no change in the diagonal or right coronary artery stenoses.  The patient was placed on medical management for coronary arterial vasospasm.  HISTORY OF PRESENT ILLNESS:  A 50 year old female with prior history of nonobstructive CAD and chest pains with catheterization performed in [facility] in July 2011, revealing nonobstructive disease.  The patient was in her usual state of health until approximately 10 p.m. on the evening of May 12, 2011, when while getting ready for bed, she had sharp 8- 9/10 midsternal chest pain radiating to her left arm and shoulder associated with dyspnea, diaphoresis, and nausea.  Once symptoms did not improve immediately, she was taken to the ED by her husband where she was treated with nitrates and made pain free.  The patient also reported some  exertional chest discomfort over the past few weeks.  She was admitted for evaluation.  HOSPITAL COURSE:  The patient ruled out for MI and her ECG remained nonacute.  Given symptoms, decision was made to pursue diagnostic cardiac catheterization which took place on May 13, 2011, initially revealing moderate obstructive disease involving the LAD diagonal and circumflex and nonobstructive calcified disease in the proximal mid right coronary artery.  EF was greater than 60%.  The patient was treated with intracoronary nitroglycerin resulting in reduction of proximal LAD and circumflex stenoses.  In the setting of documented coronary artery vasospasm, medical therapy was recommended and oral nitrates and calcium channel blocker were initiated.  Following catheterization, the patient was ambulating without recurrent symptoms or limitations.  We plan to discharge her home today in good condition and she has been counseled on importance of smoking cessation.  DISCHARGE LABORATORY DATA:  Hemoglobin 13.5, hematocrit 38.9, WBC 9.0, platelets 140.  Sodium 139, potassium 2.4, chloride 107, CO2 of 25, BUN 13, creatinine 0.66, glucose 93.  Total bilirubin 0.2, alkaline phosphatase 75, AST 24, ALT 25, total protein 7.8, albumin 4.4, calcium 8.1, magnesium 2.0.  Hemoglobin A1c 5.7, CK 81, MB 1.4, troponin I less than 0.30, total cholesterol 201, triglycerides 88, HDL 69, LDL 114.  DISPOSITION:  The patient will be discharged home today in good condition.  FOLLOWUP PLANS  AND APPOINTMENTS:  The patient will follow up with Norma Fredrickson, nurse practitioner on May 28, 2011, at 10:30 a.m.  The patient will follow up with Dr. Caryl Never as previously scheduled.  DISCHARGE MEDICATIONS: 1. Diltiazem CD 120 mg daily. 2. Imdur 30 mg daily. 3. Nitroglycerin 0.4 mg subcu p.r.n. chest pain. 4. Aspirin 81 mg daily. 5. Lipitor 20 mg at bedtime.  OUTSTANDING LABS AND STUDIES:  None.  DURATION OF  DISCHARGE ENCOUNTER:  Thirty five minutes including physician time.     [name], ANP   ______________________________ Cassell Clement, M.D.    CB/MEDQ  D:  05/14/2011  T:  05/14/2011  Job:  [id number]  cc:   Evelena Peat, M.D.  Electronically Signed by [name] ANP on 05/20/2011 03:50:08 PM Electronically Signed by Cassell Clement M.D. on 05/21/2011 12:52:53 PM

## 2011-05-28 ENCOUNTER — Encounter: Payer: Self-pay | Admitting: Nurse Practitioner

## 2011-05-28 ENCOUNTER — Ambulatory Visit (INDEPENDENT_AMBULATORY_CARE_PROVIDER_SITE_OTHER): Payer: PRIVATE HEALTH INSURANCE | Admitting: Nurse Practitioner

## 2011-05-28 DIAGNOSIS — F17200 Nicotine dependence, unspecified, uncomplicated: Secondary | ICD-10-CM

## 2011-05-28 DIAGNOSIS — I2510 Atherosclerotic heart disease of native coronary artery without angina pectoris: Secondary | ICD-10-CM | POA: Insufficient documentation

## 2011-05-28 DIAGNOSIS — Z720 Tobacco use: Secondary | ICD-10-CM

## 2011-05-28 NOTE — Progress Notes (Signed)
[name] Date of Birth: 03/09/1961   History of Present Illness: Caroline [name] is seen back today for a post hospital visit. She is seen for Dr. Patty Sermons. She has had a bout of angina and had cardiac cath. She had documented vasospasm. She was given IC NTG and still had CAD but to a lesser degree. She is managed medically. She has stopped smoking. She has developed a URI and has been using pseudophed. No recurrent chest pain. No problems with her groin.   Current Outpatient Prescriptions on File Prior to Visit  Medication Sig Dispense Refill  . aspirin 81 MG tablet Take 81 mg by mouth daily.        Marland Kitchen atorvastatin (LIPITOR) 20 MG tablet Take 20 mg by mouth daily.        Marland Kitchen diltiazem (CARDIZEM) 120 MG tablet Take 120 mg by mouth daily.        . isosorbide dinitrate (ISORDIL) 30 MG tablet Take 30 mg by mouth daily.        . naproxen sodium (ALEVE) 220 MG tablet Take 220 mg by mouth. Once a week as needed for headaches and will take 400 mg       . nitroGLYCERIN (NITROSTAT) 0.4 MG SL tablet Place 0.4 mg under the tongue every 5 (five) minutes as needed.        Marland Kitchen dexlansoprazole (DEXILANT) 60 MG capsule Take 1 capsule (60 mg total) by mouth daily.  30 capsule  2   Current Facility-Administered Medications on File Prior to Visit  Medication Dose Route Frequency Provider Last Rate Last Dose  . DISCONTD: 0.9 %  sodium chloride infusion  500 mL Intravenous Continuous Iva Boop, MD        No Known Allergies  Past Medical History  Diagnosis Date  . GERD (gastroesophageal reflux disease)   . Hyperlipidemia   . Hx of cholecystectomy   . Hx of appendectomy   . CAD (coronary artery disease)     has documented coronary vasospasm but with underlying 40% prox LAD, 30% prox LCX and 70 to 80% proximal 1st DX and 30% RCA  . S/P cardiac cath Sept 2012    Past Surgical History  Procedure Date  . Cholecystectomy 1982  . Appendectomy 1976  . Breast surgery 2002    biopsy  . Cardiac  catheterization 2011  . Upper gastrointestinal endoscopy 01/11/2011    ?esophagitis, gastritis  . Cardiac catheterization Sept 2012    With documented vasospasm but with residual disease; managed medically    History  Smoking status  . Former Smoker -- 0.2 packs/day for 30 years  . Types: Cigarettes  . Quit date: 05/12/2011  Smokeless tobacco  . Never Used    History  Alcohol Use  . 1.0 oz/week  . 2 drink(s) per week    4-5 per week     Family History  Problem Relation Age of Onset  . Breast cancer Mother 48  . Heart disease Mother   . Diabetes Mother   . Prostate cancer Father   . Hyperlipidemia Father   . Heart disease Father   . Colon cancer      First cousin on dads side     Review of Systems: The review of systems is positive for recent URI that is resolving. No chest pain She has had some mild headache with her nitrate therapy but it is improving.  All other systems were reviewed and are negative.  Physical Exam: BP 106/70  Pulse 84  Ht 5' 4.5" (1.638 m)  Wt 160 lb 12.8 oz (72.938 kg)  BMI 27.17 kg/m2 Patient is very pleasant and in no acute distress. Skin is warm and dry. Color is normal.  HEENT is unremarkable. Normocephalic/atraumatic. PERRL. Sclera are nonicteric. Neck is supple. No masses. No JVD. Lungs are clear. Cardiac exam shows a regular rate and rhythm. Abdomen is soft. Extremities are without edema. Gait and ROM are intact. No gross neurologic deficits noted.   LABORATORY DATA:   Assessment / Plan:

## 2011-05-28 NOTE — Patient Instructions (Signed)
Congratulations for not smoking No psuedophed! Go and get Coricidin HBP for any cold symptoms.  Plain Mucinex will help with your cough OK to exercise. Goal is 45 minutes per day. I want to see you in about 6 weeks.

## 2011-05-28 NOTE — Assessment & Plan Note (Signed)
She has stopped smoking as of September 12th. 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

## 2011-05-28 NOTE — Assessment & Plan Note (Signed)
She has had recent cath that demonstrated vasospasm. She was given IC NTG. She has residual CAD and is managed medically. She has stopped smoking. I have asked her to stop the pseudophed and use Coricidin HBP instead for cold symptoms. I will see her back in about 6 weeks. Regular exercise is encouraged. Patient is agreeable to this plan and will call if any problems develop in the interim.

## 2011-06-01 ENCOUNTER — Observation Stay (HOSPITAL_COMMUNITY)
Admission: EM | Admit: 2011-06-01 | Discharge: 2011-06-02 | Disposition: A | Discharge status: Home / Self Care | Payer: PRIVATE HEALTH INSURANCE | Source: Ambulatory Visit | Attending: Cardiology | Admitting: Cardiology

## 2011-06-01 ENCOUNTER — Emergency Department (HOSPITAL_COMMUNITY): Payer: PRIVATE HEALTH INSURANCE

## 2011-06-01 DIAGNOSIS — R0602 Shortness of breath: Secondary | ICD-10-CM | POA: Insufficient documentation

## 2011-06-01 DIAGNOSIS — I2510 Atherosclerotic heart disease of native coronary artery without angina pectoris: Secondary | ICD-10-CM | POA: Insufficient documentation

## 2011-06-01 DIAGNOSIS — R079 Chest pain, unspecified: Secondary | ICD-10-CM

## 2011-06-01 DIAGNOSIS — E785 Hyperlipidemia, unspecified: Secondary | ICD-10-CM | POA: Insufficient documentation

## 2011-06-01 DIAGNOSIS — Z87891 Personal history of nicotine dependence: Secondary | ICD-10-CM | POA: Insufficient documentation

## 2011-06-01 LAB — CBC
HCT: 44.6 % (ref 36.0–46.0)
Hemoglobin: 16 g/dL — ABNORMAL HIGH (ref 12.0–15.0)
MCV: 95.5 fL (ref 78.0–100.0)
Platelets: 197 10*3/uL (ref 150–400)
RBC: 4.67 MIL/uL (ref 3.87–5.11)
WBC: 8.3 10*3/uL (ref 4.0–10.5)

## 2011-06-01 LAB — DIFFERENTIAL
Basophils Relative: 1 % (ref 0–1)
Monocytes Absolute: 0.6 10*3/uL (ref 0.1–1.0)
Monocytes Relative: 7 % (ref 3–12)
Neutro Abs: 4.4 10*3/uL (ref 1.7–7.7)

## 2011-06-01 LAB — HEPATIC FUNCTION PANEL
AST: 20 U/L (ref 0–37)
Albumin: 4 g/dL (ref 3.5–5.2)
Alkaline Phosphatase: 73 U/L (ref 39–117)
Total Protein: 7.3 g/dL (ref 6.0–8.3)

## 2011-06-01 LAB — CK TOTAL AND CKMB (NOT AT ARMC)
CK, MB: 2 ng/mL (ref 0.3–4.0)
Total CK: 84 U/L (ref 7–177)

## 2011-06-01 LAB — POCT I-STAT, CHEM 8
BUN: 19 mg/dL (ref 6–23)
Calcium, Ion: 1.15 mmol/L (ref 1.12–1.32)
Chloride: 104 mEq/L (ref 96–112)
Creatinine, Ser: 0.7 mg/dL (ref 0.50–1.10)
Glucose, Bld: 110 mg/dL — ABNORMAL HIGH (ref 70–99)

## 2011-06-01 LAB — POCT I-STAT TROPONIN I: Troponin i, poc: 0 ng/mL (ref 0.00–0.08)

## 2011-06-01 LAB — TROPONIN I: Troponin I: <0.3 ng/mL (ref <0.30)

## 2011-06-02 LAB — CBC
HCT: 43.9 % (ref 36.0–46.0)
Platelets: 182 10*3/uL (ref 150–400)
RDW: 13.3 % (ref 11.5–15.5)
WBC: 6.3 10*3/uL (ref 4.0–10.5)

## 2011-06-02 LAB — BASIC METABOLIC PANEL
BUN: 15 mg/dL (ref 6–23)
Creatinine, Ser: 0.76 mg/dL (ref 0.50–1.10)
GFR calc Af Amer: >90 mL/min (ref >90)
GFR calc non Af Amer: >90 mL/min (ref >90)

## 2011-06-02 LAB — CARDIAC PANEL(CRET KIN+CKTOT+MB+TROPI)
CK, MB: 1.8 ng/mL (ref 0.3–4.0)
Troponin I: <0.3 ng/mL (ref <0.30)

## 2011-06-02 NOTE — H&P (Addendum)
NAMETENEA, SENS NO.:  [id number]  MEDICAL RECORD NO.:  [id number]  LOCATION:  2037                         FACILITY:  [facility]  PHYSICIAN:  Arturo Morton. Riley Kill, MD, FACCDATE OF BIRTH:  16-Dec-1960  DATE OF ADMISSION:  06/01/2011 DATE OF DISCHARGE:                             HISTORY & PHYSICAL   PRIMARY CARDIOLOGIST:  Cassell Clement, MD  PRIMARY MEDICAL DOCTOR:  Evelena Peat, MD  CHIEF COMPLAINT:  Chest pain.  HISTORY OF PRESENT ILLNESS:  Ms. Oetken is a 50 year old lady with a history of hyperlipidemia, tobacco abuse who recently quit, and documented coronary vasospasm by cath May 13, 2011, who presents with chest pain.  She was at work making coffee, when she felt to abrupt stabbing chest pains, back to back, followed by cold sweat, questionable sensation, palpitations, and nausea.  Her coworker brought her purse. At that point in time, she was no longer having chest pain since it lasted only seconds.  She took one sublingual nitro under tongue and began shaking.  She may have had some shortness of breath as well, reported as mild.  She sent from the ER, she has had two episodes where she has felt palpitations, shortness of breath, and feeling somewhat disoriented.  No loss of consciousness.  One of the episodes, she called for the nurse who noted no altered mental status but did happen to see that on telemetry the patient's heart rate was up in the 90s.  The second time it showed on x-ray.  She is currently feeling well without symptoms.  Troponin is negative x1 and EKG is without acute changes from prior.  PAST MEDICAL HISTORY: 1. Nonobstructive CAD by cath May 13, 2011, but with documented     coronary vasospasm in the setting of unstable angina. 2. A catheterization initially revealed 70% proximal LAD, 80%     circumflex, 70%-80% first diagonal, 30% RCA.  After IV     nitroglycerin, she had a reduction and LAD stenosis of 40%,    reduction and circumflex stenosis 30%, and first diagonal RCA     remain the same. 3. Hyperlipidemia. 4. Tobacco abuse. 5. Normal LV function by cath May 13, 2011.  SURGICAL HISTORY:  Appendectomy, cholecystectomy.  MEDICATIONS: 1. Diltiazem 120 mg daily. 2. Imdur 30 mg daily. 3. Nitro sublingual p.r.n. 4. Aspirin 81 mg daily. 5. Lipitor 20 mg daily.  ALLERGIES:  No known drug allergies.  SOCIAL HISTORY:  Ms. Rossa lives in [place].  She is married with 2 children.  She works for [employer].  She has a positive history of tobacco abuse, but quit after her last admission.  She was congragulated on that.  She drinks approximately 6 alcohol drinks per week.  She denies any illicit drug use.  FAMILY HISTORY:  Mother is died at 66 of COPD.  Father had CAD and CABG. She has one brother who is 39 who was diagnosed with CAD of 70.  REVIEW OF SYSTEMS:  No fevers, chills, edema, cough, syncope, melena, hematemesis.  All other system has been, otherwise negative.  LABORATORY DATA:  WBC 8.3, hemoglobin 16, hematocrit 44, platelet count 197.  Sodium 137, potassium 4.2, chloride  104, glucose 110, BUN 19, creatinine 0.7.  Troponin negative x1.  EKG:  Normal sinus rhythm without acute changes.  T-wave inversion aVL, unchanged from prior.  RADIOLOGIC STUDIES:  Chest x-ray showed stable mild chronic lung disease.  No acute cardiopulmonary disease.  PHYSICAL EXAMINATION:  VITAL SIGNS:  Temperature 98.1, pulse 81, respirations 16, blood pressure 126/80, 99% on room air. GENERAL: A pleasant white female, in no acute distress, who is comfortable. HEENT:  Normocephalic, atraumatic with extraocular movements intact. Clear sclerae.  Nares are without discharge. NECK:  Supple without carotid bruit or JVD. HEART:  Auscultation of the heart reveals regular rate and rhythm with S1-S2 without murmurs, rubs, or gallops.  LUNGS: Auscultation bilaterally without wheezes,  rales or rhonchi. ABDOMEN:  Soft, nontender, and nondistended.  Positive bowel sounds. EXTREMITIES: Warm, dry and without edema. NEUROLOGIC:  She is alert and oriented x3, responds questions appropriately with a normal affect.  ASSESSMENT:  The patient was seen and examined by Dr. Riley Kill and myself.  This is a 50 year old lady with a history of prior tobacco abuse, nonobstructive coronary artery disease with documented coronary vasospasm by cath May 13, 2011, who presents with 2 brief episodes of stabbing chest pain associated diaphoresis, nausea.  She has also had episodes of elevated heart rate, palpitations, sensation of disorientation while in the ED.  Telemetry has revealed only increase in his heart rates to the 90s, sinus rhythm with no ventricular arrhythmias.  EKG is unchanged and prior enzymes were negative x1.  At that time, we will admit the patient to the hospital and cycle cardiac enzymes.  We will continue her current home medicines, with the exception of increasing her diltiazem in the event that this is coronary vasospasm.  We will check a D-dimer given her recent procedure and if this is positive, we will proceed with CT angio. We will give her clear liquid breakfast in the morning, then n.p.o. after midnight, Dr. Patty Sermons was seen her in the morning to determine if further invasive studies are needed.     Dayna Dunn, P.A.C.   ______________________________ Arturo Morton Riley Kill, MD, [facility]    DD/MEDQ  D:  06/01/2011  T:  06/02/2011  Job:  [id number]  cc:   Cassell Clement, M.D. Evelena Peat, M.D.  Electronically Signed by Ronie Spies  on 06/02/2011 09:32:53 PM Electronically Signed by Shawnie Pons MD [facility] on 06/10/2011 05:42:35 AM

## 2011-06-09 NOTE — Discharge Summary (Signed)
NAMEMarland Kitchen  KARRAH, MANGINI NO.:  [id number]  MEDICAL RECORD NO.:  [id number]  LOCATION:  2037                         FACILITY:  [facility]  PHYSICIAN:  Cassell Clement, M.D. DATE OF BIRTH:  07/13/1961  DATE OF ADMISSION:  06/01/2011 DATE OF DISCHARGE:  06/02/2011                              DISCHARGE SUMMARY   PRIMARY CARDIOLOGIST:  Cassell Clement, MD  PRIMARY CARE PROVIDER:  Evelena Peat, MD  DISCHARGE DIAGNOSIS:  Chest pain ruling out for myocardial infarction this admission, felt to be secondary to coronary spasms, which were documented on May 13, 2011 versus esophageal spasms.  SECONDARY DIAGNOSES: 1. Nonobstructive coronary artery disease by cardiac catheterization     on May 13, 2011. 2. Hyperlipidemia. 3. Recent tobacco cessation.  ALLERGIES:  No known drug allergies.  PROCEDURE/DIAGNOSTICS PERFORMED DURING HOSPITALIZATION: 1. Chest x-ray on June 01, 2011:  Stable mild chronic lung disease     with no acute cardiopulmonary process. 2. EKG showing normal sinus rhythm without acute changes.  HISTORY OF PRESENT ILLNESS:  This is a 50 year old female with the above- stated problem list and is specifically nonobstructive coronary artery disease by cardiac catheterization on May 13, 2011 with documented coronary vasospasm, presented to the emergency department with acute onset of stabbing chest pain followed by cold sweat as well as nausea. Chest pain lasted only seconds.  After this episode, she took a sublingual nitroglycerin where she began shaking and had right complaint of palpitations and questionable disorientation, but no loss consciousness.  In the emergency department, the patient's troponin was negative x1 and EKG was without acute changes.  The patient was admitted for further observation.  HOSPITAL COURSE:  The patient was admitted to the telemetry unit where she had no further complaints of chest pain.  Her rhythm  remained stable without episodes on telemetry.  She ruled out for myocardial infarction with cardiac enzymes being negative x3.  The D-dimer was checked to rule out for pulmonary embolus and this was negative.  This patient has documented coronary vasospasm, her diltiazem was increased during hospitalization, she tolerated this well.  She will be continued on this at discharge.  Dr. Patty Sermons felt the patient's chest pain after ruling out for myocardial infarction was secondary to her known coronary spasm versus gastroesophageal reflux disease versus esophageal spasm; therefore, he added empiric omeprazole, a prescription has been given. She has had some complaints of cough.  Therefore, we have also recommended trying Mucinex.  On day of discharge, Dr. Patty Sermons evaluated the patient and noted her stable for home.  She was able to ambulate in the halls without difficulty.  Again, she had no further complaints of chest pain.  She had ruled out for myocardial infarction.  DISCHARGE LABS:  Cardiac enzymes negative x3.  Sodium 139, potassium 4.3, BUN 15, creatinine 0.76.  Hemoglobin 15, hematocrit 43.9.  DISCHARGE MEDICATIONS: 1. Diltiazem 120 mg CD/ER two tablets daily. 2. Omeprazole 40 mg one tablet daily. 3. Guaifenesin 600 mg one tablet twice daily. 4. Aspirin 81 mg one tablet daily. 5. Imdur 30 mg one tablet daily. 6. Lipitor 20 mg one tablet daily. 7. Nitroglycerin sublingual 0.4 mg one tablet under the tongue as  the     onset of chest pain, may repeat every 5 minutes up to three doses     as needed.  FOLLOWUP PLANS AND INSTRUCTIONS: 1. The patient will follow up with Norma Fredrickson, nurse practitioner     for Dr. Patty Sermons on July 07, 2011 at 3:30 p.m. 2. The patient is to call the office in the interim for any problems     or concerns. 3. The patient is to increase activities as tolerated. 4. The patient is to continue low-sodium and heart-healthy diet. 5. The patient is  to stop any acute that causes chest pain or     shortness of breath.  DURATION OF DISCHARGE:  Greater than 30 minutes with physician and physician extender time.     Leonette Monarch, PA-C   ______________________________ Cassell Clement, M.D.    NB/MEDQ  D:  06/02/2011  T:  06/02/2011  Job:  [id number]  cc:   Evelena Peat, M.D. Cassell Clement, M.D.  Electronically Signed by Alen Blew P.A. on 06/03/2011 03:11:28 PM Electronically Signed by Cassell Clement M.D. on 06/09/2011 09:00:48 AM

## 2011-06-10 NOTE — Cardiovascular Report (Signed)
[name], [name] NO.:  [id number]  MEDICAL RECORD NO.:  [id number]  LOCATION:  3731                         FACILITY:  [facility]  PHYSICIAN:  Arturo Morton. Riley Kill, MD, FACCDATE OF BIRTH:  Sep 05, 1960  DATE OF PROCEDURE:  05/13/2011 DATE OF DISCHARGE:                           CARDIAC CATHETERIZATION   INDICATIONS:  Caroline [name] was admitted with chest pain.  The patient has a prior history of undergoing intravascular ultrasound for proximal LAD disease in [place] about a year ago.  Her enzymes are negative and she has no electrocardiographic abnormalities, but her symptoms are worrisome for unstable angina.  The patient is a smoker.  She was seen by Dr. Shirlee Latch and then Dr. Patty Sermons who recommended cardiac catheterization.  We discussed the risks with her in the catheterization laboratory.  PROCEDURE: 1. Left heart catheterization. 2. Selective coronary arteriography. 3. Selective left ventriculography. 4. Intracoronary nitroglycerin administration.  DESCRIPTION OF PROCEDURE:  The patient was brought to the cath lab and prepped and draped in usual fashion.  Allen test was attempted on several occasions but they could never really get a good signal question due to spasm.  Following this, we elected to use the femoral area.  The right femoral artery was used and 4-French sheath was utilized.  Views of the left and right coronary arteries were obtained in multiple angiographic projections.  Intracoronary nitroglycerin was administered into the left coronary artery, there was a dramatic change based on the intracoronary administration.  Following this, central aortic and left ventricular pressures were measured with pigtail and ventriculography was performed in the RAO projection.  I then showed the pictures to the patient.  I subsequently showed them to her husband and reviewed them in detail.  She tolerated the procedure well and there were no  major complications.  She was taken to the holding area for sheath removal.  HEMODYNAMIC FINDINGS: 1. Central aortic pressure 125/75, mean 97. 2. LV pressure 127/17. 3. No gradient or pullback across the aortic valve.  ANGIOGRAPHIC DATA: 1. It is of note that there was marked change in vessel diameter after     the intracoronary administration of 100 and 200 mcg of     nitroglycerin. 2. There is diffuse calcification of coronary vessels. 3. Left ventriculography in the RAO projection reveals preserved     global systolic function with ejection fraction in excess of 60%. 4. The left main is free of critical disease. 5. The LAD has about 70% ostial narrowing best seen both in the LAO     cranials and the LAO caudal views.  Importantly, after the     administration of intracoronary nitroglycerin, there was dramatic     increase in the vessel diameter coursing all the way down to the     apex.  There did remain an eccentric shelf of plaque, and this is     consistent with the underlying calcium, however, in most views it     did not appear to be high-grade or significant.  There is a     diagonal branch which is modest in size which has an ostial lesion     which remains after the administration of  nitroglycerin. 6. The circumflex is a very large-caliber vessel.  Importantly, there     was a change in diameter throughout the case.  The proximal portion     demonstrated up to 80% narrowing in the LAO caudal views.  After     intracoronary nitroglycerin administration, there was dramatic     increase in the vessel diameter throughout.  There was less than     30% narrowing after the administration of nitroglycerin. 7. The right coronary artery was injected after the intracoronary     nitroglycerin have been given into the left system.  The right     coronary artery was a large-caliber vessel with some calcified     plaque.  There were tandem areas of about 30% narrowing but no high-      grade areas of focal stenosis.  There was an RV branch, acute     marginal branch, posterior descending, and posterolateral system     and the vessel distribution is fairly large.  CONCLUSIONS: 1. Preserved left ventricular systolic function. 2. Diffuse coronary calcification. 3. High-grade stenoses of the ostium of the left anterior descending     artery and proximal circumflex vessel which were relieved     substantially with intracoronary nitroglycerin administration. 4. Continued residual high-grade disease of the small to moderate     diagonal branch.  DISPOSITION:  I reviewed the films with the patient and subsequently with her husband.  She absolutely must stop smoking.  We will add nitrates and calcium channel antagonists to her regimen and continue her aspirin as well as statins.  I have called Dr. Patty Sermons, and we will continue to keep her in the hospital perhaps for another 36 hours in which she can be stabilized.  She absolutely must stop smoking, however.     Arturo Morton. Riley Kill, MD, [facility]     TDS/MEDQ  D:  05/13/2011  T:  05/14/2011  Job:  [id number]  cc:   CV Laboratory Cassell Clement, M.D.  Electronically Signed by Shawnie Pons MD [facility] on 06/10/2011 05:42:32 AM

## 2011-06-24 ENCOUNTER — Telehealth: Payer: Self-pay | Admitting: Cardiology

## 2011-06-24 MED ORDER — DILTIAZEM HCL 120 MG PO TABS: 120.0000 mg | ORAL_TABLET | Freq: Two times a day (BID) | ORAL | Status: DC

## 2011-06-24 NOTE — Telephone Encounter (Signed)
This is Dr Yevonne Pax patient. I will forward message to [facility].

## 2011-06-24 NOTE — Telephone Encounter (Signed)
Sent Rx to pharmacy.  Did review last hospital discharge and diltiazem was increased to twice daily

## 2011-06-24 NOTE — Telephone Encounter (Signed)
Pt's diltiazem was increased from 120 mg to 220mg  so is taking twice as many, when she went to refill med she had to pay full price because refill was not due yet, filled yesterday, so will need authorization for new refills with new dosage called to [facility] and would like refills

## 2011-07-07 ENCOUNTER — Ambulatory Visit: Payer: PRIVATE HEALTH INSURANCE | Admitting: Nurse Practitioner

## 2011-07-10 ENCOUNTER — Telehealth: Payer: Self-pay | Admitting: Adult Health

## 2011-07-10 NOTE — Telephone Encounter (Signed)
agree with advice given

## 2011-07-10 NOTE — Telephone Encounter (Signed)
Patient is confused about dosing of medication. She is on Cardizem CD 120 mg BID instead of taking it as 2 tablets once daily.  She is feeling bad with BID dosing. I have recommended that she take 2 tablets in the am as this has been better tolerated for her.

## 2011-07-12 ENCOUNTER — Ambulatory Visit (INDEPENDENT_AMBULATORY_CARE_PROVIDER_SITE_OTHER)
Admission: RE | Admit: 2011-07-12 | Discharge: 2011-07-12 | Disposition: A | Discharge status: Home / Self Care | Payer: PRIVATE HEALTH INSURANCE | Source: Ambulatory Visit | Attending: Nurse Practitioner | Admitting: Nurse Practitioner

## 2011-07-12 ENCOUNTER — Encounter: Payer: Self-pay | Admitting: Nurse Practitioner

## 2011-07-12 ENCOUNTER — Telehealth: Payer: Self-pay | Admitting: *Deleted

## 2011-07-12 ENCOUNTER — Telehealth: Payer: Self-pay | Admitting: Cardiology

## 2011-07-12 ENCOUNTER — Ambulatory Visit (INDEPENDENT_AMBULATORY_CARE_PROVIDER_SITE_OTHER): Payer: PRIVATE HEALTH INSURANCE | Admitting: Nurse Practitioner

## 2011-07-12 DIAGNOSIS — I2510 Atherosclerotic heart disease of native coronary artery without angina pectoris: Secondary | ICD-10-CM

## 2011-07-12 DIAGNOSIS — R002 Palpitations: Secondary | ICD-10-CM

## 2011-07-12 DIAGNOSIS — F17200 Nicotine dependence, unspecified, uncomplicated: Secondary | ICD-10-CM

## 2011-07-12 DIAGNOSIS — Z720 Tobacco use: Secondary | ICD-10-CM

## 2011-07-12 DIAGNOSIS — M549 Dorsalgia, unspecified: Secondary | ICD-10-CM

## 2011-07-12 DIAGNOSIS — R079 Chest pain, unspecified: Secondary | ICD-10-CM

## 2011-07-12 MED ORDER — METOPROLOL TARTRATE 25 MG PO TABS: 25.0000 mg | ORAL_TABLET | Freq: Two times a day (BID) | ORAL | Status: DC

## 2011-07-12 MED ORDER — IOHEXOL 300 MG/ML  SOLN
80.0000 mL | Freq: Once | INTRAMUSCULAR | Status: AC | PRN
  Administered 2011-07-12: 80 mL via INTRAVENOUS

## 2011-07-12 NOTE — Telephone Encounter (Signed)
Patient states her heart has bee racing, sweating , shills, dizzy, also a feeling sensation like she is going to pass out. She does not know if it is her medication. Diltiazem dose was changed to 120 mg twice a day instead of taken the medication 2 tablet in the AM, like she was taken it before.

## 2011-07-12 NOTE — Assessment & Plan Note (Signed)
She does have nonobstructive CAD with documented vasospasm. Her medicines are adjusted today. Lopressor 25 mg BID was added. She is not smoking.

## 2011-07-12 NOTE — Progress Notes (Signed)
Caroline [name] Date of Birth: [birth year] Medical Record #[id number]  History of Present Illness: Caroline [name] is seen today for a work in visit. She is seen for Dr. Patty Sermons. She has had recent cardiac cath back in September of this year for chest pain. Had documented vasospasm. Was given IC NTG during the cath and had residual CAD but to a lesser degree. She is managed medically.   She returns today for a work in appointment. Since her last visit, she has been back to the hospital for an overnight stay at the first of October. It was felt that her symptoms were either from coronary vasospasm or esophageal spasm. Her Cardizem was doubled and Omeprazole was added.   She is now having spells of back pain radiating to her left arm that makes her feel presyncopal and sweaty. She has had multiple spells over the past 3 days. It occurs with and without exertion. She feels like her heart is racing. These spells make her feel sweaty. She is very anxious and upset. Blood pressure remains up at home. She is not smoking.   Current Outpatient Prescriptions on File Prior to Visit  Medication Sig Dispense Refill  . aspirin 81 MG tablet Take 81 mg by mouth daily.        Marland Kitchen atorvastatin (LIPITOR) 20 MG tablet Take 20 mg by mouth daily.        Marland Kitchen dexlansoprazole (DEXILANT) 60 MG capsule Take 60 mg by mouth daily.        Marland Kitchen diltiazem (CARDIZEM) 120 MG tablet Take 1 tablet (120 mg total) by mouth 2 (two) times daily.  180 tablet  3  . isosorbide dinitrate (ISORDIL) 30 MG tablet Take 30 mg by mouth daily.        . naproxen sodium (ALEVE) 220 MG tablet Take 220 mg by mouth. Once a week as needed for headaches and will take 400 mg       . nitroGLYCERIN (NITROSTAT) 0.4 MG SL tablet Place 0.4 mg under the tongue every 5 (five) minutes as needed.        Marland Kitchen dexlansoprazole (DEXILANT) 60 MG capsule Take 1 capsule (60 mg total) by mouth daily.  30 capsule  2   No current facility-administered medications on file prior to visit.      No Known Allergies  Past Medical History  Diagnosis Date  . GERD (gastroesophageal reflux disease)   . Hyperlipidemia   . Hx of cholecystectomy   . Hx of appendectomy   . CAD (coronary artery disease)     has documented coronary vasospasm but with underlying 40% prox LAD, 30% prox LCX and 70 to 80% proximal 1st DX and 30% RCA  . S/P cardiac cath Sept 2012    has CAD with documented vasospasm. CAD was nonobstructive.     Past Surgical History  Procedure Date  . Cholecystectomy 1982  . Appendectomy 1976  . Breast surgery 2002    biopsy  . Cardiac catheterization 2011  . Upper gastrointestinal endoscopy 01/11/2011    ?esophagitis, gastritis  . Cardiac catheterization Sept 2012    With documented vasospasm but with residual disease; managed medically    History  Smoking status  . Former Smoker -- 0.2 packs/day for 30 years  . Types: Cigarettes  . Quit date: 05/12/2011  Smokeless tobacco  . Never Used    History  Alcohol Use  . 1.0 oz/week  . 2 drink(s) per week    4-5 per week  Family History  Problem Relation Age of Onset  . Breast cancer Mother 35  . Heart disease Mother   . Diabetes Mother   . Prostate cancer Father   . Hyperlipidemia Father   . Heart disease Father   . Colon cancer      First cousin on dads side     Review of Systems: The review of systems is per the HPI.  All other systems were reviewed and are negative.  Physical Exam: BP 158/88  Pulse 75  Ht 5\' 4"  (1.626 m)  Wt 168 lb 12.8 oz (76.567 kg)  BMI 28.97 kg/m2 Patient is quite anxious, tearful but in no acute distress. Skin is warm and dry. Color is normal.  HEENT is unremarkable. Normocephalic/atraumatic. PERRL. Sclera are nonicteric. Neck is supple. No masses. No JVD. Lungs are clear. Cardiac exam shows a regular rate and rhythm. Abdomen is soft. Extremities are without edema. Gait and ROM are intact. No gross neurologic deficits noted.   LABORATORY DATA: EKG shows sinus  rhythm, 1st degree AV block, septal Q's. Tracing is unchanged from prior EKG and is reviewed with Dr. Patty Sermons.   Assessment / Plan:

## 2011-07-12 NOTE — Telephone Encounter (Signed)
Advised of results

## 2011-07-12 NOTE — Telephone Encounter (Signed)
Message copied by Burnell Blanks on Mon Jul 12, 2011  6:15 PM ------      Message from: Rosalio Macadamia      Created: Mon Jul 12, 2011  4:20 PM       Ok to report. CT is ok. She has a 24 hour monitor in place. Lopressor 25 mg BID was added at her visit today.

## 2011-07-12 NOTE — Patient Instructions (Signed)
We are going to put a monitor on you to look at your rhythm for the next 24 hours.  We are going to get a CT scan of your chest   We are adding Lopressor 25 mg two times a day to help with your blood pressure, heart racing and blood pressure.   Keep next appointment.

## 2011-07-12 NOTE — Assessment & Plan Note (Signed)
Her case is reviewed with Dr. Patty Sermons. We will proceed on with CT angiogram to rule out dissection as well as other abnormalities. We will place a 24 Holter to rule out arrhythmia as the etiology for her presyncope. Lopressor 25 mg BID is added to her current regimen. Will need to monitor her since she does have this first degree AV block. She is reassured. We will see her back at her regular appointment later this month. Patient is agreeable to this plan and will call if any problems develop in the interim.

## 2011-07-12 NOTE — Assessment & Plan Note (Signed)
She is not smoking. She is congratulated.  

## 2011-07-12 NOTE — Telephone Encounter (Signed)
Spoke with patient and she has been scheduled to see Lawson Fiscal NP today

## 2011-07-12 NOTE — Telephone Encounter (Signed)
Pt calling re having heart racing, "PINCHING" FEELING IN CHEST, SWEATING, FEELS SENSATION OF POSSIBLY GOING TO PASS OUT

## 2011-07-13 ENCOUNTER — Ambulatory Visit: Payer: PRIVATE HEALTH INSURANCE | Admitting: Nurse Practitioner

## 2011-07-20 ENCOUNTER — Other Ambulatory Visit: Payer: PRIVATE HEALTH INSURANCE

## 2011-07-21 ENCOUNTER — Telehealth: Payer: Self-pay | Admitting: *Deleted

## 2011-07-21 ENCOUNTER — Other Ambulatory Visit: Payer: Self-pay | Admitting: Cardiology

## 2011-07-21 NOTE — Telephone Encounter (Signed)
Refilled generic lipitor 

## 2011-07-21 NOTE — Telephone Encounter (Signed)
Adv patient of monitor results

## 2011-07-28 ENCOUNTER — Encounter: Payer: Self-pay | Admitting: Nurse Practitioner

## 2011-07-28 ENCOUNTER — Ambulatory Visit (INDEPENDENT_AMBULATORY_CARE_PROVIDER_SITE_OTHER): Payer: PRIVATE HEALTH INSURANCE | Admitting: Family Medicine

## 2011-07-28 ENCOUNTER — Ambulatory Visit (INDEPENDENT_AMBULATORY_CARE_PROVIDER_SITE_OTHER): Payer: PRIVATE HEALTH INSURANCE | Admitting: Nurse Practitioner

## 2011-07-28 ENCOUNTER — Encounter: Payer: Self-pay | Admitting: Family Medicine

## 2011-07-28 VITALS — BP 112/70 | HR 70 | Ht 64.0 in | Wt 169.8 lb

## 2011-07-28 DIAGNOSIS — R079 Chest pain, unspecified: Secondary | ICD-10-CM

## 2011-07-28 DIAGNOSIS — F17200 Nicotine dependence, unspecified, uncomplicated: Secondary | ICD-10-CM

## 2011-07-28 DIAGNOSIS — I2510 Atherosclerotic heart disease of native coronary artery without angina pectoris: Secondary | ICD-10-CM

## 2011-07-28 DIAGNOSIS — Z720 Tobacco use: Secondary | ICD-10-CM

## 2011-07-28 DIAGNOSIS — Z8719 Personal history of other diseases of the digestive system: Secondary | ICD-10-CM | POA: Insufficient documentation

## 2011-07-28 DIAGNOSIS — R000 Tachycardia, unspecified: Secondary | ICD-10-CM

## 2011-07-28 NOTE — Patient Instructions (Signed)
Use only plain Mucinex or Coricidin HBP for your cold symptoms.  We will see you back in 4 months.  Here are my tips to lose weight:  1. Drink only water. You do not need milk, juice, tea, soda or diet soda.  2. Do not eat anything "white". This includes white bread, potatoes, rice or mayo  3. Stay away from fried foods and sweets  4. Your portion should be the size of the palm of your hand.  5. Know what your weaknesses are and avoid.  6. Find an exercise you like and do it every day for 45 to 60 minutes.  Call the [facility] office at [phone] if you have any questions, problems or concerns.

## 2011-07-28 NOTE — Assessment & Plan Note (Addendum)
She is doing very well. No change in her medicines. She is not smoking. We will see her back in 4 months. She is encouraged to work on her diet and weight.  Patient is agreeable to this plan and will call if any problems develop in the interim.

## 2011-07-28 NOTE — Progress Notes (Signed)
Subjective:    Patient ID: Caroline [name], female    DOB: [birth year], 50 y.o.   MRN: [id number]  HPI  Medical followup. Seen by cardiology earlier today. Recent atypical chest pain and palpitations. Patient has had 2 recent hospital evaluations and extensive workup. Back in September cardiac catheterization with no critical coronary obstruction. Subsequently admitted in October with recurrent chest pain and CT scan revealing no aneurysm. Holter monitor no significant arrhythmias. Addition of low-dose beta blocker and no further palpitations since then.  History of gastritis and esophagitis by EGD last in May. Has been on Protonix and omeprazole but still has some intermittent epigastric discomfort and reflux symptoms. Has previously been on Dexilant which seemed to be more effective but she had problems with insurance coverage. No dysphagia. No appetite or weight changes. No exertional chest pain. Recently quit smoking.  Tries to stick with bland diet.  Desires to lose weight. Has made some dietary changes but no consistent exercise. Recent labs done for additional health insurance and she is asked that those be forwarded to [place].    Past Medical History  Diagnosis Date  . GERD (gastroesophageal reflux disease)   . Hyperlipidemia   . Hx of cholecystectomy   . Hx of appendectomy   . CAD (coronary artery disease)     has documented coronary vasospasm but with underlying 40% prox LAD, 30% prox LCX and 70 to 80% proximal 1st DX and 30% RCA  . S/P cardiac cath Sept 2012    has CAD with documented vasospasm. CAD was nonobstructive.   . Palpitations     tachycardia on holter; treated with beta blockers   Past Surgical History  Procedure Date  . Cholecystectomy 1982  . Appendectomy 1976  . Breast surgery 2002    biopsy  . Cardiac catheterization 2011  . Upper gastrointestinal endoscopy 01/11/2011    ?esophagitis, gastritis  . Cardiac catheterization Sept 2012    With documented vasospasm but  with residual disease; managed medically    reports that she quit smoking about 2 months ago. Her smoking use included Cigarettes. She has a 7.5 pack-year smoking history. She has never used smokeless tobacco. She reports that she drinks about one ounce of alcohol per week. She reports that she does not use illicit drugs. family history includes Breast cancer (age of onset:36) in her mother; Colon cancer in an unspecified family member; Diabetes in her mother; Heart disease in her father and mother; Hyperlipidemia in her father; and Prostate cancer in her father. No Known Allergies    Review of Systems  Constitutional: Negative for appetite change and unexpected weight change.  HENT: Negative for trouble swallowing.   Respiratory: Negative for cough and shortness of breath.   Cardiovascular: Negative for chest pain, palpitations and leg swelling.  Gastrointestinal: Negative for nausea, vomiting, diarrhea and blood in stool.  Neurological: Negative for weakness.       Objective:   Physical Exam  Constitutional: She appears well-developed and well-nourished. No distress.  Neck: Neck supple. No thyromegaly present.  Cardiovascular: Normal rate, regular rhythm and normal heart sounds.   Pulmonary/Chest: Effort normal and breath sounds normal. No respiratory distress. She has no wheezes. She has no rales.  Abdominal: Soft. She exhibits no distension and no mass. There is no rebound and no guarding.       Minimally tender epigastric region  Lymphadenopathy:    She has no cervical adenopathy.          Assessment & Plan:  #  1 history of GERD and gastritis. ? Whether this may have contributed to recent atypical chest pain symptoms. Get back on Dexilant with samples given and if not any additional improvement or resolution of symptoms in one month followup with gastroenterology. Dietary factors discussed. She uses minimal caffeine. No consistent alcohol. #2 recent chest pain resolved with  extensive workup as above. Palpitations improved with Lopressor #3 history of nicotine use. Still smokes occasional cigarette. She is encouraged to quit altogether

## 2011-07-28 NOTE — Assessment & Plan Note (Signed)
No further episodes since the addition of Lopressor.

## 2011-07-28 NOTE — Progress Notes (Signed)
[name] Date of Birth: 11-Nov-1960 Medical Record #[id number]  History of Present Illness: Caroline [name] is seen back today for a follow up visit. It is a 6 week visit. She is seen for Dr. Patty Sermons. She had recent cardiac cath in September and had documented vasospasm. With IC NTG her spasm was resolved but she does have residual CAD but to a lesser degree. She is managed medically.   6 weeks ago she had had back pain and palpitations. We did a CT here in the office which was negative for dissection. A holter was placed. She did have some tachycardia but no significant arrhythmia. I added some beta blocker.  She comes in today. She is doing great! Not smoking. No chest pain. Trying to walk and work on her weight. Has had some cold symptoms.   Current Outpatient Prescriptions on File Prior to Visit  Medication Sig Dispense Refill  . aspirin 81 MG tablet Take 81 mg by mouth daily.        Marland Kitchen atorvastatin (LIPITOR) 20 MG tablet Take 1 tablet (20 mg total) by mouth daily.  30 tablet  11  . diltiazem (CARDIZEM) 120 MG tablet Take 1 tablet (120 mg total) by mouth 2 (two) times daily.  180 tablet  3  . isosorbide dinitrate (ISORDIL) 30 MG tablet Take 30 mg by mouth daily.        . metoprolol tartrate (LOPRESSOR) 25 MG tablet Take 1 tablet (25 mg total) by mouth 2 (two) times daily.  60 tablet  11  . naproxen sodium (ALEVE) 220 MG tablet Take 220 mg by mouth. Once a week as needed for headaches and will take 400 mg       . nitroGLYCERIN (NITROSTAT) 0.4 MG SL tablet Place 0.4 mg under the tongue every 5 (five) minutes as needed.        Marland Kitchen dexlansoprazole (DEXILANT) 60 MG capsule Take 1 capsule (60 mg total) by mouth daily.  30 capsule  2  . DISCONTD: atorvastatin (LIPITOR) 20 MG tablet Take 20 mg by mouth daily.        Marland Kitchen DISCONTD: dexlansoprazole (DEXILANT) 60 MG capsule Take 60 mg by mouth daily.          No Known Allergies  Past Medical History  Diagnosis Date  . GERD (gastroesophageal reflux  disease)   . Hyperlipidemia   . Hx of cholecystectomy   . Hx of appendectomy   . CAD (coronary artery disease)     has documented coronary vasospasm but with underlying 40% prox LAD, 30% prox LCX and 70 to 80% proximal 1st DX and 30% RCA  . S/P cardiac cath Sept 2012    has CAD with documented vasospasm. CAD was nonobstructive.     Past Surgical History  Procedure Date  . Cholecystectomy 1982  . Appendectomy 1976  . Breast surgery 2002    biopsy  . Cardiac catheterization 2011  . Upper gastrointestinal endoscopy 01/11/2011    ?esophagitis, gastritis  . Cardiac catheterization Sept 2012    With documented vasospasm but with residual disease; managed medically    History  Smoking status  . Former Smoker -- 0.2 packs/day for 30 years  . Types: Cigarettes  . Quit date: 05/12/2011  Smokeless tobacco  . Never Used    History  Alcohol Use  . 1.0 oz/week  . 2 drink(s) per week    4-5 per week     Family History  Problem Relation Age of Onset  . Breast  cancer Mother 65  . Heart disease Mother   . Diabetes Mother   . Prostate cancer Father   . Hyperlipidemia Father   . Heart disease Father   . Colon cancer      First cousin on dads side     Review of Systems: The review of systems is per the HPI.  All other systems were reviewed and are negative.  Physical Exam: BP 112/70  Pulse 70  Ht 5\' 4"  (1.626 m)  Wt 169 lb 12.8 oz (77.021 kg)  BMI 29.15 kg/m2 Patient is very pleasant and in no acute distress. She looks like she is feeling better. Skin is warm and dry. Color is normal.  HEENT is unremarkable. Normocephalic/atraumatic. PERRL. Sclera are nonicteric. Neck is supple. No masses. No JVD. Lungs are clear. Cardiac exam shows a regular rate and rhythm. Abdomen is soft. Extremities are without edema. Gait and ROM are intact. No gross neurologic deficits noted.   LABORATORY DATA:   Assessment / Plan:

## 2011-07-28 NOTE — Assessment & Plan Note (Signed)
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

## 2011-08-04 ENCOUNTER — Encounter: Payer: Self-pay | Admitting: Family Medicine

## 2011-08-04 ENCOUNTER — Telehealth: Payer: Self-pay | Admitting: Family Medicine

## 2011-08-04 ENCOUNTER — Ambulatory Visit (INDEPENDENT_AMBULATORY_CARE_PROVIDER_SITE_OTHER): Payer: PRIVATE HEALTH INSURANCE | Admitting: Family Medicine

## 2011-08-04 VITALS — BP 120/70 | Temp 98.9°F | Wt 168.0 lb

## 2011-08-04 DIAGNOSIS — B349 Viral infection, unspecified: Secondary | ICD-10-CM

## 2011-08-04 DIAGNOSIS — J029 Acute pharyngitis, unspecified: Secondary | ICD-10-CM

## 2011-08-04 DIAGNOSIS — B9789 Other viral agents as the cause of diseases classified elsewhere: Secondary | ICD-10-CM

## 2011-08-04 MED ORDER — AZITHROMYCIN 250 MG PO TABS: ORAL_TABLET | ORAL | Status: AC

## 2011-08-04 NOTE — Progress Notes (Signed)
  Subjective:    Patient ID: Caroline [name], female    DOB: 1961/05/14, 50 y.o.   MRN: [id number]  HPI  Acute visit. Son recently diagnosed strep pharyngitis. Patient presents with couple day history of probable fever, chills, sore throat, headaches. Minimal nasal congestion and rare cough. No skin rash. Denies any nausea, vomiting, or diarrhea. Temperature not taken   Review of Systems  Constitutional: Positive for chills.  HENT: Positive for sore throat. Negative for ear pain, congestion and neck stiffness.   Respiratory: Positive for cough. Negative for shortness of breath and wheezing.        Objective:   Physical Exam  Constitutional: She appears well-developed and well-nourished.  HENT:  Right Ear: External ear normal.  Left Ear: External ear normal.       Minimal posterior pharynx erythema. No exudate  Neck: Neck supple.  Cardiovascular: Normal rate and regular rhythm.   Pulmonary/Chest: Effort normal and breath sounds normal. No respiratory distress. She has no wheezes. She has no rales.  Lymphadenopathy:    She has no cervical adenopathy.          Assessment & Plan:  Probable viral syndrome. Rapid strep negative. Recommend observation for now. Start Zithromax if she has any persistent fever and absence of cough and nasal congestion with recent strep exposure

## 2011-08-04 NOTE — Telephone Encounter (Signed)
Pt experiencing Sore throat, stiff neck,fever, chills, loss of appetite, head ache and body ache. Pt is hoping to either get worked in at the end of the day or have an rx called in please contact pt

## 2011-08-04 NOTE — Patient Instructions (Signed)
Consider starting Zithromax if you have any persistent fever, especially in absence of nasal symptoms or cough

## 2011-08-04 NOTE — Telephone Encounter (Signed)
OK to double book 1:15, have her come around 1pm.  Thanks

## 2011-09-06 ENCOUNTER — Telehealth: Payer: Self-pay | Admitting: Family Medicine

## 2011-09-06 ENCOUNTER — Encounter: Payer: Self-pay | Admitting: Internal Medicine

## 2011-09-06 NOTE — Telephone Encounter (Signed)
Terri, Will you see if this pt can be seen sooner.  Thanks

## 2011-09-06 NOTE — Telephone Encounter (Signed)
She really needs to see GI and see if they can get her worked in sooner.

## 2011-09-06 NOTE — Telephone Encounter (Signed)
Pt is still having gi issues and is unable to get in with Dr Leone Payor until next month. Dr Caryl Never put her on Dexilant samples. They are no longer working. She is passing bright red blood on/off for 3 weeks, but stated not every bowel moment causes the blood. I recommended her coming in, she refused my offer. Please advise patient, because she is unable to eat. Thanks.

## 2011-09-13 ENCOUNTER — Telehealth: Payer: Self-pay | Admitting: Internal Medicine

## 2011-09-13 NOTE — Telephone Encounter (Signed)
I have left a message for Caroline [name] to call back

## 2011-09-13 NOTE — Telephone Encounter (Signed)
Patient is rescheduled to see Willette Cluster RNP tomorrow at 9:00

## 2011-09-14 ENCOUNTER — Other Ambulatory Visit: Payer: Self-pay

## 2011-09-14 ENCOUNTER — Inpatient Hospital Stay (HOSPITAL_COMMUNITY)
Admission: EM | Admit: 2011-09-14 | Discharge: 2011-09-18 | DRG: 282 | Disposition: A | Discharge status: Home / Self Care | Payer: Managed Care, Other (non HMO) | Source: Ambulatory Visit | Attending: Cardiovascular Disease | Admitting: Cardiovascular Disease

## 2011-09-14 ENCOUNTER — Encounter (HOSPITAL_COMMUNITY): Payer: Self-pay | Admitting: Emergency Medicine

## 2011-09-14 ENCOUNTER — Encounter (HOSPITAL_COMMUNITY)
Admission: EM | Disposition: A | Discharge status: Home / Self Care | Payer: Self-pay | Source: Ambulatory Visit | Attending: Cardiovascular Disease

## 2011-09-14 ENCOUNTER — Ambulatory Visit: Payer: PRIVATE HEALTH INSURANCE | Admitting: Nurse Practitioner

## 2011-09-14 DIAGNOSIS — Z803 Family history of malignant neoplasm of breast: Secondary | ICD-10-CM

## 2011-09-14 DIAGNOSIS — Z800 Family history of malignant neoplasm of digestive organs: Secondary | ICD-10-CM

## 2011-09-14 DIAGNOSIS — E785 Hyperlipidemia, unspecified: Secondary | ICD-10-CM | POA: Diagnosis present

## 2011-09-14 DIAGNOSIS — Z87891 Personal history of nicotine dependence: Secondary | ICD-10-CM

## 2011-09-14 DIAGNOSIS — Z8719 Personal history of other diseases of the digestive system: Secondary | ICD-10-CM | POA: Insufficient documentation

## 2011-09-14 DIAGNOSIS — I2129 ST elevation (STEMI) myocardial infarction involving other sites: Secondary | ICD-10-CM

## 2011-09-14 DIAGNOSIS — Z8249 Family history of ischemic heart disease and other diseases of the circulatory system: Secondary | ICD-10-CM

## 2011-09-14 DIAGNOSIS — E669 Obesity, unspecified: Secondary | ICD-10-CM | POA: Diagnosis present

## 2011-09-14 DIAGNOSIS — I2510 Atherosclerotic heart disease of native coronary artery without angina pectoris: Secondary | ICD-10-CM | POA: Insufficient documentation

## 2011-09-14 DIAGNOSIS — Z8042 Family history of malignant neoplasm of prostate: Secondary | ICD-10-CM

## 2011-09-14 DIAGNOSIS — K589 Irritable bowel syndrome without diarrhea: Secondary | ICD-10-CM | POA: Diagnosis present

## 2011-09-14 DIAGNOSIS — I201 Angina pectoris with documented spasm: Principal | ICD-10-CM | POA: Insufficient documentation

## 2011-09-14 DIAGNOSIS — K5900 Constipation, unspecified: Secondary | ICD-10-CM | POA: Diagnosis not present

## 2011-09-14 DIAGNOSIS — R7401 Elevation of levels of liver transaminase levels: Secondary | ICD-10-CM

## 2011-09-14 DIAGNOSIS — R1013 Epigastric pain: Secondary | ICD-10-CM | POA: Insufficient documentation

## 2011-09-14 DIAGNOSIS — R079 Chest pain, unspecified: Secondary | ICD-10-CM

## 2011-09-14 DIAGNOSIS — Z23 Encounter for immunization: Secondary | ICD-10-CM

## 2011-09-14 DIAGNOSIS — Z720 Tobacco use: Secondary | ICD-10-CM | POA: Insufficient documentation

## 2011-09-14 DIAGNOSIS — Z7982 Long term (current) use of aspirin: Secondary | ICD-10-CM

## 2011-09-14 DIAGNOSIS — I214 Non-ST elevation (NSTEMI) myocardial infarction: Secondary | ICD-10-CM | POA: Diagnosis present

## 2011-09-14 DIAGNOSIS — Z833 Family history of diabetes mellitus: Secondary | ICD-10-CM

## 2011-09-14 DIAGNOSIS — R7402 Elevation of levels of lactic acid dehydrogenase (LDH): Secondary | ICD-10-CM | POA: Diagnosis not present

## 2011-09-14 DIAGNOSIS — Z79899 Other long term (current) drug therapy: Secondary | ICD-10-CM

## 2011-09-14 DIAGNOSIS — K219 Gastro-esophageal reflux disease without esophagitis: Secondary | ICD-10-CM | POA: Diagnosis present

## 2011-09-14 HISTORY — DX: Angina pectoris with documented spasm: I20.1

## 2011-09-14 HISTORY — DX: Irritable bowel syndrome, unspecified: K58.9

## 2011-09-14 HISTORY — PX: LEFT HEART CATHETERIZATION WITH CORONARY ANGIOGRAM: SHX5451

## 2011-09-14 LAB — PROTIME-INR
INR: 1.04 (ref 0.00–1.49)
Prothrombin Time: 13.8 seconds (ref 11.6–15.2)

## 2011-09-14 LAB — CARDIAC PANEL(CRET KIN+CKTOT+MB+TROPI)
CK, MB: 5.1 ng/mL — ABNORMAL HIGH (ref 0.3–4.0)
CK, MB: 8.9 ng/mL (ref 0.3–4.0)
Relative Index: 5.9 — ABNORMAL HIGH (ref 0.0–2.5)
Troponin I: 1.33 ng/mL (ref <0.30)

## 2011-09-14 LAB — COMPREHENSIVE METABOLIC PANEL
Alkaline Phosphatase: 80 U/L (ref 39–117)
BUN: 20 mg/dL (ref 6–23)
Calcium: 8.6 mg/dL (ref 8.4–10.5)
GFR calc Af Amer: >90 mL/min (ref >90)
Glucose, Bld: 86 mg/dL (ref 70–99)
Total Protein: 7 g/dL (ref 6.0–8.3)

## 2011-09-14 LAB — CBC
HCT: 41.8 % (ref 36.0–46.0)
Hemoglobin: 13.7 g/dL (ref 12.0–15.0)
MCH: 31.6 pg (ref 26.0–34.0)
MCHC: 32.8 g/dL (ref 30.0–36.0)
MCV: 96.3 fL (ref 78.0–100.0)

## 2011-09-14 SURGERY — LEFT HEART CATHETERIZATION WITH CORONARY ANGIOGRAM
Anesthesia: LOCAL

## 2011-09-14 MED ORDER — DILTIAZEM HCL ER COATED BEADS 240 MG PO CP24
240.0000 mg | ORAL_CAPSULE | Freq: Every day | ORAL | Status: DC
  Administered 2011-09-14: 240 mg via ORAL
  Filled 2011-09-14 (×2): qty 1

## 2011-09-14 MED ORDER — ASPIRIN 81 MG PO CHEW
324.0000 mg | CHEWABLE_TABLET | Freq: Once | ORAL | Status: AC
  Administered 2011-09-14: 324 mg via ORAL
  Filled 2011-09-14: qty 4

## 2011-09-14 MED ORDER — MORPHINE SULFATE 4 MG/ML IJ SOLN
INTRAMUSCULAR | Status: AC
  Filled 2011-09-14: qty 1

## 2011-09-14 MED ORDER — SODIUM CHLORIDE 0.9 % IJ SOLN
3.0000 mL | Freq: Two times a day (BID) | INTRAMUSCULAR | Status: DC
  Administered 2011-09-14 – 2011-09-15 (×2): 3 mL via INTRAVENOUS
  Administered 2011-09-16: 10:00:00 via INTRAVENOUS
  Administered 2011-09-16 – 2011-09-17 (×3): 3 mL via INTRAVENOUS

## 2011-09-14 MED ORDER — NITROGLYCERIN 0.4 MG SL SUBL
0.4000 mg | SUBLINGUAL_TABLET | SUBLINGUAL | Status: DC | PRN
  Administered 2011-09-17 – 2011-09-18 (×2): 0.4 mg via SUBLINGUAL
  Filled 2011-09-14: qty 25

## 2011-09-14 MED ORDER — MORPHINE SULFATE 4 MG/ML IJ SOLN
4.0000 mg | INTRAMUSCULAR | Status: DC | PRN
  Administered 2011-09-14: 4 mg via INTRAVENOUS

## 2011-09-14 MED ORDER — SODIUM CHLORIDE 0.9 % IV SOLN
INTRAVENOUS | Status: AC
  Administered 2011-09-14: 10 mL/h via INTRAVENOUS

## 2011-09-14 MED ORDER — LIDOCAINE HCL (PF) 1 % IJ SOLN
INTRAMUSCULAR | Status: AC
  Filled 2011-09-14: qty 30

## 2011-09-14 MED ORDER — NITROGLYCERIN 0.2 MG/ML ON CALL CATH LAB
INTRAVENOUS | Status: AC
  Filled 2011-09-14: qty 1

## 2011-09-14 MED ORDER — ACETAMINOPHEN 325 MG PO TABS: 650.0000 mg | ORAL_TABLET | ORAL | Status: DC | PRN

## 2011-09-14 MED ORDER — PANTOPRAZOLE SODIUM 40 MG PO TBEC
40.0000 mg | DELAYED_RELEASE_TABLET | Freq: Every day | ORAL | Status: DC
  Administered 2011-09-14 – 2011-09-18 (×5): 40 mg via ORAL
  Filled 2011-09-14 (×5): qty 1

## 2011-09-14 MED ORDER — MORPHINE SULFATE 2 MG/ML IJ SOLN
2.0000 mg | INTRAMUSCULAR | Status: DC | PRN
  Administered 2011-09-14 – 2011-09-18 (×3): 2 mg via INTRAVENOUS
  Filled 2011-09-14 (×3): qty 1

## 2011-09-14 MED ORDER — ONDANSETRON HCL 4 MG/2ML IJ SOLN
INTRAMUSCULAR | Status: AC
  Filled 2011-09-14: qty 2

## 2011-09-14 MED ORDER — ALPRAZOLAM 0.25 MG PO TABS: 0.2500 mg | ORAL_TABLET | Freq: Two times a day (BID) | ORAL | Status: DC | PRN

## 2011-09-14 MED ORDER — HEPARIN (PORCINE) IN NACL 2-0.9 UNIT/ML-% IJ SOLN
INTRAMUSCULAR | Status: AC
  Filled 2011-09-14: qty 2000

## 2011-09-14 MED ORDER — ACETAMINOPHEN 325 MG PO TABS
650.0000 mg | ORAL_TABLET | ORAL | Status: DC | PRN
  Administered 2011-09-15: 650 mg via ORAL
  Filled 2011-09-14: qty 2

## 2011-09-14 MED ORDER — ONDANSETRON HCL 4 MG/2ML IJ SOLN
INTRAMUSCULAR | Status: AC
  Administered 2011-09-14: 06:00:00
  Filled 2011-09-14: qty 2

## 2011-09-14 MED ORDER — SIMVASTATIN 40 MG PO TABS: 40.0000 mg | ORAL_TABLET | Freq: Every day | ORAL | Status: DC

## 2011-09-14 MED ORDER — NITROGLYCERIN IN D5W 200-5 MCG/ML-% IV SOLN: 5.0000 ug/min | INTRAVENOUS | Status: DC

## 2011-09-14 MED ORDER — SODIUM CHLORIDE 0.9 % IV SOLN: 250.0000 mL | INTRAVENOUS | Status: DC | PRN

## 2011-09-14 MED ORDER — NITROGLYCERIN IN D5W 200-5 MCG/ML-% IV SOLN
INTRAVENOUS | Status: AC
  Filled 2011-09-14: qty 250

## 2011-09-14 MED ORDER — FENTANYL CITRATE 0.05 MG/ML IJ SOLN
INTRAMUSCULAR | Status: AC
  Filled 2011-09-14: qty 2

## 2011-09-14 MED ORDER — ZOLPIDEM TARTRATE 5 MG PO TABS
10.0000 mg | ORAL_TABLET | Freq: Every evening | ORAL | Status: DC | PRN
  Administered 2011-09-16: 10 mg via ORAL
  Filled 2011-09-14: qty 2

## 2011-09-14 MED ORDER — NITROGLYCERIN 0.4 MG SL SUBL: 0.4000 mg | SUBLINGUAL_TABLET | SUBLINGUAL | Status: DC | PRN

## 2011-09-14 MED ORDER — ONDANSETRON HCL 4 MG/2ML IJ SOLN
4.0000 mg | Freq: Four times a day (QID) | INTRAMUSCULAR | Status: DC | PRN
  Administered 2011-09-14: 4 mg via INTRAVENOUS
  Filled 2011-09-14: qty 2

## 2011-09-14 MED ORDER — MORPHINE SULFATE 4 MG/ML IJ SOLN
4.0000 mg | Freq: Once | INTRAMUSCULAR | Status: AC
  Administered 2011-09-14: 4 mg via INTRAVENOUS
  Filled 2011-09-14: qty 1

## 2011-09-14 MED ORDER — SODIUM CHLORIDE 0.9 % IJ SOLN: 3.0000 mL | INTRAMUSCULAR | Status: DC | PRN

## 2011-09-14 MED ORDER — MORPHINE SULFATE 2 MG/ML IJ SOLN
INTRAMUSCULAR | Status: AC
  Administered 2011-09-14: 2 mg via INTRAVENOUS
  Filled 2011-09-14: qty 1

## 2011-09-14 MED ORDER — INFLUENZA VIRUS VACC SPLIT PF IM SUSP
0.5000 mL | INTRAMUSCULAR | Status: AC
  Administered 2011-09-15: 0.5 mL via INTRAMUSCULAR
  Filled 2011-09-14: qty 0.5

## 2011-09-14 MED ORDER — OXYCODONE-ACETAMINOPHEN 5-325 MG PO TABS
2.0000 | ORAL_TABLET | ORAL | Status: DC | PRN
  Administered 2011-09-14 – 2011-09-16 (×8): 2 via ORAL
  Filled 2011-09-14 (×4): qty 2
  Filled 2011-09-14: qty 1
  Filled 2011-09-14 (×4): qty 2

## 2011-09-14 MED ORDER — ROSUVASTATIN CALCIUM 10 MG PO TABS
10.0000 mg | ORAL_TABLET | Freq: Every day | ORAL | Status: DC
  Administered 2011-09-14 – 2011-09-17 (×4): 10 mg via ORAL
  Filled 2011-09-14 (×6): qty 1

## 2011-09-14 MED ORDER — ONDANSETRON HCL 4 MG/2ML IJ SOLN: 4.0000 mg | Freq: Four times a day (QID) | INTRAMUSCULAR | Status: DC | PRN

## 2011-09-14 MED ORDER — ASPIRIN EC 81 MG PO TBEC
81.0000 mg | DELAYED_RELEASE_TABLET | Freq: Every day | ORAL | Status: DC
  Administered 2011-09-14 – 2011-09-18 (×5): 81 mg via ORAL
  Filled 2011-09-14 (×5): qty 1

## 2011-09-14 MED ORDER — MIDAZOLAM HCL 2 MG/2ML IJ SOLN
INTRAMUSCULAR | Status: AC
  Filled 2011-09-14: qty 2

## 2011-09-14 NOTE — ED Notes (Signed)
Patient received 4mg  zofran prior to arrival  And approx 400cc NS

## 2011-09-14 NOTE — Progress Notes (Signed)
Chaplain Note. Paged for Code STEMI. Spoke w/ Cath Lab staff. Pt in treatment. Spoke w/ pt husband in short stay waiting. Refer to day chaplain.

## 2011-09-14 NOTE — Progress Notes (Signed)
Lab reported with critical value CK-MB 8.9. Consistent with previously reported cardiac enzymes. Pt stable post-cath. Caroline [name]

## 2011-09-14 NOTE — H&P (Signed)
Patient ID: Caroline [name] MRN: [id number], DOB/AGE: Apr 13, 1961   Admit date: 09/14/2011   Primary Physician: Kristian Covey, MD Primary Cardiologist: Wylene Simmer  Pt. Profile:   51 y/o female w/ h/o coronary vasospasm on chronic nitrates and ccb who presented to the ED w/ epigastric pain, nausea, vomiting and later developed chest and bilat arm pain w/ intermittent st elevation.  Problem List: Past Medical History  Diagnosis Date  . GERD (gastroesophageal reflux disease)   . Hyperlipidemia   . Hx of cholecystectomy   . Hx of appendectomy   . CAD (coronary artery disease)     has documented coronary vasospasm but with underlying 40% prox LAD, 30% prox LCX and 70 to 80% proximal 1st DX and 30% RCA  . Palpitations     tachycardia on holter; treated with beta blockers  . Coronary vasospasm   . Gastritis   . Esophagitis     Past Surgical History  Procedure Date  . Cholecystectomy 1982  . Appendectomy 1976  . Breast surgery 2002    biopsy  . Cardiac catheterization 2011  . Upper gastrointestinal endoscopy 01/11/2011    ?esophagitis, gastritis  . Cardiac catheterization Sept 2012    With documented vasospasm but with residual disease; managed medically     Allergies: No Known Allergies  HPI:   51 y/o female with the above problem list.  Pt has a h/o coronary vasospasm as outlined above, which was well documented on cath in 05/2011.  She has been on oral nitrate and ccb since.  Recently, she has been experiencing a fair amt of epigastric discomfort and had been scheduled to see Dr. Leone Payor today.  Unfortunately, pt awoke this am (3a) with severe epigastric discomfort, n, and v.  She did not initially have chest pain or dyspnea.  After about 2 hrs of discomfort, pt presented to the ED and was initially worked up for GI etiology of Ss.  Around 7a, pt began to c/o chest pain and bilat arm pain, similar to prior angina.  ECG was performed, during c/p, @ 7:23 am and this showed  inflat st elevation along with ant st dep and twi.  Repeat ECG 3 mins later showed improvement of these changes and Ss had also improved.  We were called to eval and upon interview, pt reported recurrent c/p.  Decision was made to pursue cath.   Home Medications Prior to Admission medications   Medication Sig Start Date End Date Taking? Authorizing Provider  aspirin 81 MG tablet Take 81 mg by mouth daily.     Yes Historical Provider, MD  atorvastatin (LIPITOR) 20 MG tablet Take 1 tablet (20 mg total) by mouth daily. 07/21/11 07/20/12 Yes Cassell Clement, MD  diltiazem (CARDIZEM) 120 MG tablet Take 1 tablet (120 mg total) by mouth 2 (two) times daily. 06/24/11  Yes Cassell Clement, MD  isosorbide dinitrate (ISORDIL) 30 MG tablet Take 30 mg by mouth daily.     Yes Historical Provider, MD  metoprolol tartrate (LOPRESSOR) 25 MG tablet Take 1 tablet (25 mg total) by mouth 2 (two) times daily. 07/12/11 07/11/12 Yes Rosalio Macadamia, NP  naproxen sodium (ALEVE) 220 MG tablet Take 220 mg by mouth as needed.    Yes Historical Provider, MD  nitroGLYCERIN (NITROSTAT) 0.4 MG SL tablet Place 0.4 mg under the tongue every 5 (five) minutes as needed.     Yes Historical Provider, MD  pantoprazole (PROTONIX) 40 MG tablet Take 40 mg by mouth daily.  Yes Historical Provider, MD     Family History  Problem Relation Age of Onset  . Breast cancer Mother 27  . Heart disease Mother     died CHF 17  . Diabetes Mother   . Prostate cancer Father   . Hyperlipidemia Father   . Heart disease Father     s/p cabg - alive  . Colon cancer      First cousin on dads side      History   Social History  . Marital Status: Married    Spouse Name: N/A    Number of Children: N/A  . Years of Education: N/A   Occupational History  . Not on file.   Social History Main Topics  . Smoking status: Former Smoker -- 0.2 packs/day for 30 years    Types: Cigarettes    Quit date: 05/12/2011  . Smokeless tobacco: Never  Used  . Alcohol Use: No     prev 4-5 per week.  Currently not drinking (08/2011)  . Drug Use: No  . Sexually Active: Not on file   Other Topics Concern  . Not on file   Social History Narrative   Caffeine drinks 2 daily      Review of Systems: General: negative for chills, fever, night sweats or weight changes.  Cardiovascular: +++ for chest pain.  No dyspnea on exertion, edema, orthopnea, palpitations, paroxysmal nocturnal dyspnea. Dermatological: negative for rash Respiratory: negative for cough or wheezing Urologic: negative for hematuria Abdominal: +++ for nausea, vomiting.  Also epigastric tenderness.  No diarrhea, bright red blood per rectum, melena, or hematemesis Neurologic: negative for visual changes, syncope, or dizziness All other systems reviewed and are otherwise negative except as noted above.  Physical Exam: Blood pressure 102/75, pulse 76, temperature 97.6 F (36.4 C), temperature source Oral, resp. rate 19, SpO2 99.00%.  General: Well developed, well nourished, in no acute distress. Head: Normocephalic, atraumatic, sclera non-icteric, no xanthomas, nares are without discharge.  Neck: Supple without bruits or JVD. Lungs:  Resp regular and unlabored, diminished breath sounds bilat. Heart: RRR no s3, s4, or murmurs. Abdomen: Soft with epigastric tenderness, non-distended, BS + x 4.  Msk:  Strength and tone appears normal for age. Extremities: No clubbing, cyanosis or edema. DP/PT/Radials 2+ and equal bilaterally. Neuro: Alert and oriented X 3. Moves all extremities spontaneously. Psych: Normal affect.   Labs:   Results for orders placed during the hospital encounter of 09/14/11 (from the past 72 hour(s))  CBC     Status: Abnormal   Collection Time   09/14/11  7:30 AM      Component Value Range Comment   WBC 10.7 (*) 4.0 - 10.5 (K/uL)    RBC 4.34  3.87 - 5.11 (MIL/uL)    Hemoglobin 13.7  12.0 - 15.0 (g/dL)    HCT 16.1  09.6 - 04.5 (%)    MCV 96.3  78.0 -  100.0 (fL)    MCH 31.6  26.0 - 34.0 (pg)    MCHC 32.8  30.0 - 36.0 (g/dL)    RDW 40.9  81.1 - 91.4 (%)    Platelets 180  150 - 400 (K/uL)      Radiology/Studies: No results found.  EKG:  RSR, 87, 1-1.80mm ST elevation in III, aVF, V4-V6.  Deep st dep and twi V1-V3.  ASSESSMENT AND PLAN:   1.  Vasospastic Angina/Coronary Vasospasm:  Urgent cath has again demonstrated significant coronary vasospasm involving the entire left coronary tree - responsive to  IV and IC NTG.  Otw, nonobs dzs.  Will admit to stepdown.  Cycle CE.  Cont IV NTG with plan to titrate oral dose. Cont CCB - titrate.  Of note, according to outpt medlist in epic, pt was taking short acting isordil once daily and short acting dilt twice daily.  Will consolidate both to long-acting daily meds (was d/c on long-acting meds in 05/2011 and again in 05/2011 - not clear if office documentation is accurate).  D/C BB given soft BP and clear indication for titration of other meds.  2.  Tob. Abuse:  Quit in Sept.  Continued cessation encouraged.  3.  GERD/gastritis/esophagitis:  Followed by GI.  Cont PPI.  4.  HL:  Cont statin.  Check lipids/lft's.   Signed, [name], NP 09/14/2011, 8:03 AM  I have personally seen and examined this patient with Ward Givens, NP. I agree with the assessment and plan as outlined above. She presents with chest pain, nausea, epigastric pain, diarrhea. EKG with changes concerning for ischemia. Plans for emergent cath with ongoing chest pain. Pt with h/o vasospasm by cath 9/12. Pt agrees to proceed with procedure.   MCALHANY,CHRISTOPHER 10:44 AM 09/14/2011

## 2011-09-14 NOTE — ED Provider Notes (Signed)
Medical screening examination/treatment/procedure(s) were conducted as a shared visit with non-physician practitioner(s) and myself.  I personally evaluated the patient during the encounter  Flint Melter, MD 09/14/11 [phone]

## 2011-09-14 NOTE — Op Note (Signed)
Cardiac Catheterization Operative Report  Caroline [name] [id number] 1/15/20138:34 AM Kristian Covey, MD  Procedure Performed:  1. Left Heart Catheterization 2. Selective Coronary Angiography 3. Left ventricular angiogram  Operator: Verne Carrow, MD  Indication:    EKG changes concerning for coronary ischemia with ongoing chest pain. History of coronary vasospasm with cath September 2012 with spasm in LAD and circumflex.                                    Procedure Details: The risks, benefits, complications, treatment options, and expected outcomes were discussed with the patient. The patient and/or family concurred with the proposed plan, giving informed consent. The patient was brought to the cath lab after IV hydration was begun and oral premedication was given. The patient was further sedated with Versed and Fentanyl. The right groin was prepped and draped in the usual manner. Using the modified Seldinger access technique, a 6 French sheath was placed in the right femoral artery. Standard diagnostic catheters were used to perform selective coronary angiography. A pigtail catheter was used to perform a left ventricular angiogram. During the case, the patient was found to have severe lesions in the left main, Circumflex and LAD. After IV and IC Nitroglycerin, there was minimal CAD. All of the stenoses responded to NTG. These were large caliber vessels. An Angioseal femoral artery closure device was placed in the right femoral artery.  There were no immediate complications. The patient was taken to the recovery area in stable condition.   Hemodynamic Findings: Central aortic pressure: 98/64 Left ventricular pressure: 98/12/17  Angiographic Findings:  Left main: No obstructive disease. As noted above, pt with intense vasospasm at beginning of case.   Left Anterior Descending Artery: Ostial spasm however after NTG given, there is minimal obstructive disease in the ostium with  mild calcification. The diagonal has ostial 50% stenosis, unchanged from previous cath.   Circumflex Artery: During initial injections, there is a 99% mid stenosis, however, after NTG given this area responded well with minimal 20% stenosis. The proximal portion of the OM 1 also had 30% stenosis.   Right Coronary Artery: Large, dominant vessel with 30% mid stenosis.   Left Ventricular Angiogram: LVEF=55%.   Impression: 1. Coronary vasospasm 2. Mild CAD 3. Preserved LV systolic function  Recommendations: Based on her presentation and intense coronary vasospasm, will adjust medications accordingly. Will use NTG IV today. Change calcium channel blocker to long acting and start Imdur BID tonight.        Complications:  None. The patient tolerated the procedure well.

## 2011-09-14 NOTE — ED Provider Notes (Signed)
History     CSN: [id number]  Arrival date & time 09/14/11  0543   First MD Initiated Contact with Patient 09/14/11 0701      Chief Complaint  Patient presents with  . Emesis    (Consider location/radiation/quality/duration/timing/severity/associated sxs/prior treatment) Patient is a 51 y.o. female presenting with vomiting. The history is provided by the patient.  Emesis  This is a new problem. Episode onset: 3:30 am. The problem occurs continuously. The problem has been gradually worsening. The emesis has an appearance of stomach contents. Associated symptoms include abdominal pain and diarrhea. Pertinent negatives include no chills, no cough, no fever and no headaches.   patient with a history of reflux (for which she has seen Dr Leone Payor) and coronary artery disease (for which she has seen Dr Patty Sermons and Dr Riley Kill) who complains of worsening almost daily morning epigastric pain presents with a sudden onset today at 3:30 AM of similar epigastric pain that woke her from sleep. Pain is waxing and waning, severe, burning. Radiates to the back, assoc with heaviness in bilateral arms. There has been assoc. intermittent lightheadedness as well as some recent diarrhea. Denies fever, chills, syncope, chest pain, shortness of breath. She has taken her protonix with no change in the pain.   Past Medical History  Diagnosis Date  . GERD (gastroesophageal reflux disease)   . Hyperlipidemia   . Hx of cholecystectomy   . Hx of appendectomy   . CAD (coronary artery disease)     has documented coronary vasospasm but with underlying 40% prox LAD, 30% prox LCX and 70 to 80% proximal 1st DX and 30% RCA  . S/P cardiac cath Sept 2012    has CAD with documented vasospasm. CAD was nonobstructive.   . Palpitations     tachycardia on holter; treated with beta blockers    Past Surgical History  Procedure Date  . Cholecystectomy 1982  . Appendectomy 1976  . Breast surgery 2002    biopsy  . Cardiac  catheterization 2011  . Upper gastrointestinal endoscopy 01/11/2011    ?esophagitis, gastritis  . Cardiac catheterization Sept 2012    With documented vasospasm but with residual disease; managed medically    Family History  Problem Relation Age of Onset  . Breast cancer Mother 82  . Heart disease Mother   . Diabetes Mother   . Prostate cancer Father   . Hyperlipidemia Father   . Heart disease Father   . Colon cancer      First cousin on dads side     History  Substance Use Topics  . Smoking status: Former Smoker -- 0.2 packs/day for 30 years    Types: Cigarettes    Quit date: 05/12/2011  . Smokeless tobacco: Never Used  . Alcohol Use: 1.0 oz/week    2 drink(s) per week     4-5 per week      Review of Systems  Constitutional: Positive for diaphoresis. Negative for fever and chills.  HENT: Negative for congestion, sore throat, neck pain and neck stiffness.   Eyes: Negative for pain and visual disturbance.  Respiratory: Negative for cough, chest tightness and shortness of breath.   Cardiovascular: Negative for chest pain and palpitations.  Gastrointestinal: Positive for nausea, vomiting, abdominal pain and diarrhea.  Genitourinary: Negative for dysuria, hematuria and flank pain.  Musculoskeletal: Positive for back pain. Negative for gait problem.  Skin: Negative for rash and wound.  Neurological: Positive for light-headedness. Negative for syncope and headaches.  Psychiatric/Behavioral: Negative for  confusion.    Allergies  Review of patient's allergies indicates no known allergies.  Home Medications   Current Outpatient Rx  Name Route Sig Dispense Refill  . ASPIRIN 81 MG PO TABS Oral Take 81 mg by mouth daily.      . ATORVASTATIN CALCIUM 20 MG PO TABS Oral Take 1 tablet (20 mg total) by mouth daily. 30 tablet 11  . DILTIAZEM HCL 120 MG PO TABS Oral Take 1 tablet (120 mg total) by mouth 2 (two) times daily. 180 tablet 3  . ISOSORBIDE DINITRATE 30 MG PO TABS Oral  Take 30 mg by mouth daily.      Marland Kitchen METOPROLOL TARTRATE 25 MG PO TABS Oral Take 1 tablet (25 mg total) by mouth 2 (two) times daily. 60 tablet 11  . NAPROXEN SODIUM 220 MG PO TABS Oral Take 220 mg by mouth as needed.     Marland Kitchen NITROGLYCERIN 0.4 MG SL SUBL Sublingual Place 0.4 mg under the tongue every 5 (five) minutes as needed.      Marland Kitchen PANTOPRAZOLE SODIUM 40 MG PO TBEC Oral Take 40 mg by mouth daily.        BP 107/75  Pulse 79  Temp(Src) 97.6 F (36.4 C) (Oral)  Resp 17  SpO2 98%  Physical Exam  Nursing note and vitals reviewed. Constitutional: She is oriented to person, place, and time. She appears well-developed and well-nourished.       Uncomfortable appearing  HENT:  Head: Normocephalic and atraumatic.  Right Ear: External ear normal.  Left Ear: External ear normal.  Mouth/Throat: Oropharynx is clear and moist.       Mucus membranes moist  Eyes: Conjunctivae are normal. Pupils are equal, round, and reactive to light.  Neck: Normal range of motion. Neck supple.  Cardiovascular: Normal rate, regular rhythm, normal heart sounds and intact distal pulses.   No murmur heard. Pulmonary/Chest: Effort normal and breath sounds normal. No respiratory distress. She has no wheezes. She exhibits no tenderness.  Abdominal: Soft. Bowel sounds are normal. She exhibits no distension.       Moderate epigastric TTP  Musculoskeletal: She exhibits no edema and no tenderness.  Neurological: She is alert and oriented to person, place, and time. No cranial nerve deficit.  Skin: Skin is warm and dry. No rash noted.  Psychiatric: She has a normal mood and affect.    ED Course  Procedures (including critical care time)  Labs Reviewed  CBC - Abnormal; Notable for the following:    WBC 10.7 (*)    All other components within normal limits  COMPREHENSIVE METABOLIC PANEL  URINALYSIS, ROUTINE W REFLEX MICROSCOPIC  TROPONIN I  PROTIME-INR   No results found.   Dx 1: Posterior MI    MDM  7:03 Pt  seen and evaluated. Epigastric pain, emesis. Labs ordered. Nausea medication given previously. Pain medication also to be ordered.  ECG and Troponin ordered to eval for atypical ACS (pt anginal pain is described as located in the left chest and left shoulder/arm)   7:15 ECG c/w posterior MI. Cardiology paged by attending MD.   7:30 Pt given ASA. Additional labs have been ordered, Cardiology in to see pt.         [address] [place], [place] 09/14/11 [phone]

## 2011-09-14 NOTE — ED Provider Notes (Signed)
Medical screening examination/treatment/procedure(s) were performed by non-physician practitioner and as supervising physician I was immediately available for consultation/collaboration.  Patient presents with epigastric pain and vomiting. EKG was ordered and 0705 hrs. It was consistent with a posterior MI. Code STEMI was called. Posteriorly, EKG was obtained that showed ST depression in Leads 7,8,9. Case was discussed personally with Dr. Excell Seltzer, 07:30, in the emergency department. She will be stabilized in the ED prior to definitive treatment.  CRITICAL CARE Performed by: Flint Melter   Total critical care time: 40  Critical care time was exclusive of separately billable procedures and treating other patients.  Critical care was necessary to treat or prevent imminent or life-threatening deterioration.  Critical care was time spent personally by me on the following activities: development of treatment plan with patient and/or surrogate as well as nursing, discussions with consultants, evaluation of patient's response to treatment, examination of patient, obtaining history from patient or surrogate, ordering and performing treatments and interventions, ordering and review of laboratory studies, ordering and review of radiographic studies, pulse oximetry and re-evaluation of patient's condition.   Date: 09/14/2011- 07:05  Rate: 72  Rhythm: normal sinus rhythm  QRS Axis: normal  Intervals: normal  ST/T Wave abnormalities: ST depressions anteriorly  Conduction Disutrbances:none  Narrative Interpretation: Posterior STEMI  Old EKG Reviewed: changes noted   Date: 09/14/2011- 07:24- modified posterior chest leads (7,8,9 replace 4,5,6)  Rate: 74   Rhythm: normal sinus rhythm  QRS Axis: normal  Intervals: normal  ST/T Wave abnormalities: ST depression in posterior chest leads (7,8,9)  Conduction Disutrbances:none  Narrative Interpretation: Resolved anterior depression  Old EKG Reviewed:  changes noted  Pt sent from ED to Cath Lab.   Flint Melter, MD 09/14/11 [phone]

## 2011-09-15 ENCOUNTER — Encounter (HOSPITAL_COMMUNITY): Payer: Self-pay | Admitting: Internal Medicine

## 2011-09-15 DIAGNOSIS — R7401 Elevation of levels of liver transaminase levels: Secondary | ICD-10-CM | POA: Diagnosis not present

## 2011-09-15 DIAGNOSIS — I2129 ST elevation (STEMI) myocardial infarction involving other sites: Secondary | ICD-10-CM

## 2011-09-15 DIAGNOSIS — K589 Irritable bowel syndrome without diarrhea: Secondary | ICD-10-CM | POA: Diagnosis present

## 2011-09-15 DIAGNOSIS — I201 Angina pectoris with documented spasm: Principal | ICD-10-CM

## 2011-09-15 DIAGNOSIS — Z8719 Personal history of other diseases of the digestive system: Secondary | ICD-10-CM

## 2011-09-15 DIAGNOSIS — R1013 Epigastric pain: Secondary | ICD-10-CM

## 2011-09-15 LAB — URINALYSIS, ROUTINE W REFLEX MICROSCOPIC
Ketones, ur: NEGATIVE mg/dL
Leukocytes, UA: NEGATIVE
Nitrite: NEGATIVE
Specific Gravity, Urine: 1.01 (ref 1.005–1.030)
pH: 6 (ref 5.0–8.0)

## 2011-09-15 LAB — CBC
Hemoglobin: 11.4 g/dL — ABNORMAL LOW (ref 12.0–15.0)
Platelets: 147 10*3/uL — ABNORMAL LOW (ref 150–400)
RBC: 3.54 MIL/uL — ABNORMAL LOW (ref 3.87–5.11)

## 2011-09-15 LAB — LIPID PANEL
Cholesterol: 151 mg/dL (ref 0–200)
HDL: 58 mg/dL (ref >39)
LDL Cholesterol: 78 mg/dL (ref 0–99)
Triglycerides: 76 mg/dL (ref <150)
VLDL: 15 mg/dL (ref 0–40)

## 2011-09-15 LAB — CARDIAC PANEL(CRET KIN+CKTOT+MB+TROPI)
CK, MB: 7.6 ng/mL (ref 0.3–4.0)
Troponin I: 1.3 ng/mL (ref <0.30)

## 2011-09-15 LAB — BASIC METABOLIC PANEL
CO2: 24 mEq/L (ref 19–32)
GFR calc non Af Amer: >90 mL/min (ref >90)
Glucose, Bld: 112 mg/dL — ABNORMAL HIGH (ref 70–99)
Potassium: 3.6 mEq/L (ref 3.5–5.1)
Sodium: 139 mEq/L (ref 135–145)

## 2011-09-15 MED ORDER — ISOSORBIDE MONONITRATE ER 60 MG PO TB24
60.0000 mg | ORAL_TABLET | Freq: Every day | ORAL | Status: DC
  Administered 2011-09-15 – 2011-09-17 (×3): 60 mg via ORAL
  Filled 2011-09-15 (×4): qty 1

## 2011-09-15 MED ORDER — DILTIAZEM HCL ER COATED BEADS 360 MG PO CP24
360.0000 mg | ORAL_CAPSULE | Freq: Every day | ORAL | Status: DC
  Administered 2011-09-15 – 2011-09-18 (×4): 360 mg via ORAL
  Filled 2011-09-15 (×4): qty 1

## 2011-09-15 NOTE — Progress Notes (Signed)
Subjective:  Still slight chest pain.  Cath yesterday showed intense vasospasm as the cause of her chest pain and EKG changes. Also has been having a lot of GI issues and had an appt with Dr. Leone Payor yesterday which she missed because of her acute admission. Objective:  Vital Signs in the last 24 hours: Temp:  [97.9 F (36.6 C)-98.6 F (37 C)] 98.1 F (36.7 C) (01/16 0750) Pulse Rate:  [67-85] 71  (01/16 0750) Resp:  [14-20] 19  (01/16 0750) BP: (100-120)/(60-75) 100/60 mmHg (01/16 0750) SpO2:  [94 %-99 %] 95 % (01/16 0750) Weight:  [175 lb 11.3 oz (79.7 kg)-183 lb 3.2 oz (83.1 kg)] 183 lb 3.2 oz (83.1 kg) (01/16 0600)  Intake/Output from previous day: 01/15 0701 - 01/16 0700 In: 1286 [P.O.:920; I.V.:366] Out: 1850 [Urine:1850] Intake/Output from this shift: Total I/O In: 16 [I.V.:16] Out: -      . aspirin EC  81 mg Oral Daily  . diltiazem  240 mg Oral Daily  . influenza  inactive virus vaccine  0.5 mL Intramuscular Tomorrow-1000  . pantoprazole  40 mg Oral Daily  . rosuvastatin  10 mg Oral q1800  . sodium chloride  3 mL Intravenous Q12H  . DISCONTD: simvastatin  40 mg Oral Daily      . sodium chloride 10 mL/hr (09/14/11 2206)  . nitroGLYCERIN 20 mcg/min (09/14/11 1900)    Physical Exam: The patient appears to be in no distress.  Head and neck exam reveals that the pupils are equal and reactive.  The extraocular movements are full.  There is no scleral icterus.  Mouth and pharynx are benign.  No lymphadenopathy.  No carotid bruits.  The jugular venous pressure is normal.  Thyroid is not enlarged or tender.  Chest is clear to percussion and auscultation.  No rales or rhonchi.  Expansion of the chest is symmetrical.  Heart reveals no abnormal lift or heave.  First and second heart sounds are normal.  There is no murmur gallop rub or click.  The abdomen is soft and nontender.  Bowel sounds are normoactive.  There is no hepatosplenomegaly or mass.  There are no  abdominal bruits.  Extremities reveal no phlebitis or edema.  Pedal pulses are good.  There is no cyanosis or clubbing.  Neurologic exam is normal strength and no lateralizing weakness.  No sensory deficits.  Integument reveals no rash  Lab Results:  Basename 09/15/11 0530 09/14/11 0730  WBC 6.7 10.7*  HGB 11.4* 13.7  PLT 147* 180    Basename 09/15/11 0530 09/14/11 0730  NA 139 142  K 3.6 4.1  CL 107 108  CO2 24 23  GLUCOSE 112* 86  BUN 11 20  CREATININE 0.65 0.74    Basename 09/15/11 0530 09/14/11 2212  TROPONINI 1.30* 1.33*   Hepatic Function Panel  Basename 09/14/11 0730  PROT 7.0  ALBUMIN 3.6  AST 193*  ALT 202*  ALKPHOS 80  BILITOT 0.3  BILIDIR --  IBILI --    Basename 09/15/11 0530  CHOL 151   No results found for this basename: PROTIME in the last 72 hours  Imaging: Imaging results have been reviewed  Cardiac Studies:  Assessment/Plan:  Patient Active Hospital Problem List: Coronary vasospasm ()   Assessment: will stop IV NTG and start Imdur. Will increase calcium blocker dose.   CAD (coronary artery disease) (05/28/2011)   Assessment: Enzymes are slightly elevated.  EKG today shows no ischemic changes.  Tobacco abuse (05/28/2011)   Assessment:  She quit smoking in September 2012   Plan:    History of esophagitis (07/28/2011)   Assessment:Will ask Dr. Leone Payor to see    LOS: 1 day    Caroline [name] 09/15/2011, 8:45 AM

## 2011-09-15 NOTE — Consult Note (Addendum)
[facility] Consultation  Referring Provider: Dr. Patty Sermons Primary Care Physician:  Kristian Covey, MD, MD Primary Gastroenterologist:   Stan Head, MD Reason for Consultation:  GI issues  HPI: Caroline [name] is a 51 y.o. female seen by Dr. Leone Payor in April of this year for epigastric pain, history of GERD, and   diarrhea. A CT scan of abdomen with contrast was ordered and unremarkable. An EGD was pertinent for distal esophagitis and moderate gastritis (normal gastric biopsies). Patient called our office a few days ago, was worked in for appointment but was instead admitted through the ED yesterday by cardiology for epigastric pain, nausea, vomiting, bilateral arm pain and intermittent ST elevation. Patient a history of coronary vasospasm for which she takes nitrates and calcium channel blockers.  An urgent cath yesterday showed significant coronary vasospasm accounting for her chest pain and EKG changes. Since patient missed her appointment with [place] yesterday and complains of ongoing GI problems we were called to see her inpatient. Patient describes intermittent but frequent episodes of epigastric pain radiating through to back and around left side. Pain is not necessarily related to eating, it can occur at anytime of day for unclear reasons. Episodes last anywhere from 15 minutes to an hour. Sometime pain is so intense that is causes nausea. This is same pain for which she saw Dr. Leone Payor in May though it has gotten progressively worse despite daily, and sometimes twice daily PPI . She complains of abdominal bloating as well. Her symptoms upon admission were very much like her chronic symptoms though ten times worse. Pain apparently was so severe patient lost control of her bowels and became hypotensive.  Generally speaking her bowels movements are unchanged and vary depending on diet.  Does have chronic and episodic alternating bowel pattern consistent w/IBS. Was taking Dexilant,  pantoprazole and omeprazole daily in past few weeks Sleeping up right also - to reduce pain attacks Pain not always related to food Iva Boop, MD, [facility]   Past Medical History  Diagnosis Date  . GERD (gastroesophageal reflux disease)   . Hyperlipidemia   . Hx of cholecystectomy   . Hx of appendectomy   . CAD (coronary artery disease)     has documented coronary vasospasm but with underlying 40% prox LAD, 30% prox LCX and 70 to 80% proximal 1st DX and 30% RCA  . Palpitations     tachycardia on holter; treated with beta blockers  . Coronary vasospasm       . Esophagitis     Past Surgical History  Procedure Date  . Cholecystectomy 1982  . Appendectomy 1976  . Breast surgery 2002    biopsy  . Cardiac catheterization 2011  . Upper gastrointestinal endoscopy 01/11/2011    ?esophagitis, gastritis  . Cardiac catheterization Sept 2012    With documented vasospasm but with residual disease; managed medically    Prior to Admission medications   Medication Sig Start Date End Date Taking? Authorizing Provider  aspirin 81 MG tablet Take 81 mg by mouth daily.     Yes Historical Provider, MD  atorvastatin (LIPITOR) 20 MG tablet Take 1 tablet (20 mg total) by mouth daily. 07/21/11 07/20/12 Yes Cassell Clement, MD  diltiazem (CARDIZEM) 120 MG tablet Take 1 tablet (120 mg total) by mouth 2 (two) times daily. 06/24/11  Yes Cassell Clement, MD  isosorbide dinitrate (ISORDIL) 30 MG tablet Take 30 mg by mouth daily.     Yes Historical Provider, MD  metoprolol tartrate (LOPRESSOR) 25 MG tablet  Take 1 tablet (25 mg total) by mouth 2 (two) times daily. 07/12/11 07/11/12 Yes Rosalio Macadamia, NP  naproxen sodium (ALEVE) 220 MG tablet Take 220 mg by mouth as needed.    Yes Historical Provider, MD  nitroGLYCERIN (NITROSTAT) 0.4 MG SL tablet Place 0.4 mg under the tongue every 5 (five) minutes as needed.     Yes Historical Provider, MD  pantoprazole (PROTONIX) 40 MG tablet Take 40 mg by mouth  daily.     Yes Historical Provider, MD    Current Facility-Administered Medications  Medication Dose Route Frequency Provider Last Rate Last Dose  . 0.9 %  sodium chloride infusion  250 mL Intravenous PRN Ok Anis, NP 10 mL/hr at 09/15/11 0100 10 mL at 09/15/11 0100  . 0.9 %  sodium chloride infusion   Intravenous Continuous Verne Carrow, MD 10 mL/hr at 09/14/11 2206 10 mL/hr at 09/14/11 2206  . acetaminophen (TYLENOL) tablet 650 mg  650 mg Oral Q4H PRN Ok Anis, NP      . ALPRAZolam Prudy Feeler) tablet 0.25 mg  0.25 mg Oral BID PRN Ok Anis, NP      . aspirin EC tablet 81 mg  81 mg Oral Daily Ok Anis, NP   81 mg at 09/15/11 1006  . diltiazem (CARDIZEM CD) 24 hr capsule 360 mg  360 mg Oral Daily Cassell Clement, MD   360 mg at 09/15/11 1005  . influenza  inactive virus vaccine (FLUZONE/FLUARIX) injection 0.5 mL  0.5 mL Intramuscular Tomorrow-1000 Micheline Chapman, MD      . isosorbide mononitrate (IMDUR) 24 hr tablet 60 mg  60 mg Oral Daily Cassell Clement, MD   60 mg at 09/15/11 1005  . morphine 2 MG/ML injection 2 mg  2 mg Intravenous Q1H PRN Micheline Chapman, MD   2 mg at 09/15/11 0035  . nitroGLYCERIN (NITROSTAT) SL tablet 0.4 mg  0.4 mg Sublingual Q5 min PRN Ok Anis, NP      . ondansetron [facility]) injection 4 mg  4 mg Intravenous Q6H PRN Ok Anis, NP   4 mg at 09/14/11 1121  . oxyCODONE-acetaminophen (PERCOCET) 5-325 MG per tablet 2 tablet  2 tablet Oral Q4H PRN Micheline Chapman, MD   2 tablet at 09/15/11 [phone]  . pantoprazole (PROTONIX) EC tablet 40 mg  40 mg Oral Daily Ok Anis, NP   40 mg at 09/15/11 1005  . rosuvastatin (CRESTOR) tablet 10 mg  10 mg Oral q1800 Kendra P Hiatt, PHARMD   10 mg at 09/14/11 1707  . sodium chloride 0.9 % injection 3 mL  3 mL Intravenous Q12H Ok Anis, NP   3 mL at 09/14/11 2156  . sodium chloride 0.9 % injection 3 mL  3 mL Intravenous PRN Ok Anis,  NP      . zolpidem (AMBIEN) tablet 10 mg  10 mg Oral QHS PRN Ok Anis, NP      . DISCONTD: acetaminophen (TYLENOL) tablet 650 mg  650 mg Oral Q4H PRN Verne Carrow, MD      . DISCONTD: diltiazem (CARDIZEM CD) 24 hr capsule 240 mg  240 mg Oral Daily Ok Anis, NP   240 mg at 09/14/11 1707  . DISCONTD: morphine 4 MG/ML injection 4 mg  4 mg Intravenous Q4H PRN Verne Carrow, MD   4 mg at 09/14/11 1126  . DISCONTD: nitroGLYCERIN (NITROSTAT) SL tablet 0.4 mg  0.4 mg Sublingual Q5 min PRN Cristal Deer  Rockwell [place], NP      . DISCONTD: nitroGLYCERIN 0.2 mg/mL in dextrose 5 % infusion  5 mcg/min Intravenous Titrated Ok Anis, NP 6 mL/hr at 09/14/11 1900 20 mcg/min at 09/14/11 1900  . DISCONTD: ondansetron (ZOFRAN) injection 4 mg  4 mg Intravenous Q6H PRN Verne Carrow, MD      . DISCONTD: simvastatin (ZOCOR) tablet 40 mg  40 mg Oral Daily Ok Anis, NP        Allergies as of 09/14/2011  . (No Known Allergies)    Family History  Problem Relation Age of Onset  . Breast cancer Mother 8  . Heart disease Mother     died CHF 72  . Diabetes Mother   . Prostate cancer Father   . Hyperlipidemia Father   . Heart disease Father     s/p cabg - alive  . Colon cancer      First cousin on dads side     History   Social History  . Marital Status: Married    Spouse Name: N/A    Number of Children: N/A  . Years of Education: N/A   Occupational History  . Not on file.   Social History Main Topics  . Smoking status: Former Smoker -- 0.2 packs/day for 30 years    Types: Cigarettes    Quit date: 05/12/2011  . Smokeless tobacco: Never Used  . Alcohol Use: No     prev 4-5 per week.  Currently not drinking (08/2011)  . Drug Use: No  . Sexually Active: Not on file    Social History Narrative   Caffeine drinks 2 daily     Review of Systems: All systems reviewed and negative except where noted in HPI  PHYSICAL EXAM: Vital signs in  last 24 hours: Temp:  [97.9 F (36.6 C)-98.6 F (37 C)] 98.1 F (36.7 C) (01/16 0750) Pulse Rate:  [67-85] 71  (01/16 0750) Resp:  [14-20] 19  (01/16 0750) BP: (100-120)/(60-75) 100/60 mmHg (01/16 0750) SpO2:  [94 %-99 %] 95 % (01/16 0750) Weight:  [79.7 kg (175 lb 11.3 oz)-83.1 kg (183 lb 3.2 oz)] 83.1 kg (183 lb 3.2 oz) (01/16 0600)   General:   white female in NAD Head:  Normocephalic and atraumatic. Eyes:   No icterus.   Conjunctiva pink. Ears:  Normal auditory acuity. Neck:  Supple; no masses felt Lungs:  Respirations even and unlabored. Lungs clear to auscultation bilaterally.   No wheezes, crackles, or rhonchi.  Heart:  Regular rate and rhythm Abdomen:  Soft, nondistended, nontender. Normal bowel sounds. No appreciable masses or hepatomegaly.  Rectal:  Not performed.  Msk:  Symmetrical without gross deformities.  Extremities:  Without edema. Neurologic:  Alert and  oriented x4;  grossly normal neurologically. Skin:  Intact without significant lesions or rashes. Cervical Nodes:  No significant cervical adenopathy. Psych:  Alert and cooperative. Normal affect.  No chest wall tenderness, no CVAT, mildly tender mid back to deep palpation Iva Boop, MD, [facility]   LAB RESULTS:  Basename 09/15/11 0530 09/14/11 0730  WBC 6.7 10.7*  HGB 11.4* 13.7  HCT 34.4* 41.8  PLT 147* 180   BMET  Basename 09/15/11 0530 09/14/11 0730  NA 139 142  K 3.6 4.1  CL 107 108  CO2 24 23  GLUCOSE 112* 86  BUN 11 20  CREATININE 0.65 0.74  CALCIUM 8.3* 8.6   LFT  Basename 09/14/11 0730  PROT 7.0  ALBUMIN 3.6  AST 193*  ALT 202*  ALKPHOS 80  BILITOT 0.3  BILIDIR --  IBILI --   PT/INR  Basename 09/14/11 0730  LABPROT 13.8  INR 1.04    PREVIOUS ENDOSCOPIES: EGD May 2012 - see under HPI  IMPRESSION / PLAN: 1. Acute on chronic upper abdominal pain, bloating. PPI doesn't seem to help. CTscan and EGD negative in May 2012 for these same symptoms. Patient takes NSAIDs as  needed but was taking them when we evaluated her in May so doubt PUD. Lipase not done this admit but normal in past and CTscan in past didn't show any pancreatic abnormalities in setting of same symptoms. Pain not necessarily related to meals so ischemia doesn't seem likely. Patient has had a cholecystectomy. She did have elevated transaminases this time but that is probably unrelated to her symptoms as LFTs normal in past with same type pain. See #2.  2.Transaminitis, transaminases were normal in September and October of this year.  Patient reports significant hypotensive episode at home prior to admission but would expect transaminases to be higher if this were shock liver. Pattern atypical for ETOH. Will recheck LFTs. in am.  3. GERD, documented esophagitis on EGD. No significant pyrosis on daily PPI.     Thanks   LOS: 1 day   Willette Cluster  09/15/2011, 12:22 PM   ATTENDING: I have al;so seen and evaluated the patient with hx and PE as noted above  1) LUQ pain - I believe most or all of this is cardiac from vasospasm in origin - she has been on PPI's > 1 year and pain was unresponsive to Dexilant Omeprazole and Pantoprazole (all taken daily lately). EGD did not show much except a few erosions and chest and abd/pelvic CT's in last year are unrevealing. She responded to NTG. She does have some symptoms that are out of proportion to exam and some anxiety associated with this. I think she could taper off the PPI over time. Could have GERDbut the sxs that brought her in seem highly unlikely to be that given what I wrote above. I reassured her that nothing serious going on as far as GI tract though understand symptoms are severe. She denies significant anxiety.  She did not have gastritis on gastric bx - removed from PMH  2) IBS - she has this and not so much of a problem - needs higher fiber diet once through this and as calcium channel blocker is increased.  3) abnormal transaminases - probably a  result of vasospasm as has not had before and says no alcohol lately - will recheck but would not image   So, we know she has vasospasm and I think it could explain most of this. Would adjust meds as you are. Consider adding an intermittent anti-spasmodic agent like Levsin or Bentyl if cardiology thinks it would be ok to try but probably best to adjust the vasospasm treatment and see what that does and get back to only 1 PP daily.   Will see her again.  Iva Boop, MD, [facility] [phone] (pager) 09/15/2011 4:28 PM

## 2011-09-15 NOTE — Progress Notes (Signed)
CARDIAC REHAB PHASE I   PRE:  Rate/Rhythm: 77 SR  BP:  Supine: 104/55  Sitting:   Standing:    SaO2:   MODE:  Ambulation: 250 ft   POST:  Rate/Rhythem: 96 SR  BP:  Supine:   Sitting: 104/54  Standing:    SaO2:  1420-1520  Assisted X 1 to ambulate. Pt became dizzy during walk, had to sit. She felt better and was able to walk back to room. VS stable BP after walk same as before walk. Started discharge education with pt and husband. She agrees to [employer]. [facility] in [place], will send referral.  Beatrix Fetters

## 2011-09-16 DIAGNOSIS — I214 Non-ST elevation (NSTEMI) myocardial infarction: Secondary | ICD-10-CM

## 2011-09-16 LAB — HEPATIC FUNCTION PANEL
AST: 30 U/L (ref 0–37)
Alkaline Phosphatase: 71 U/L (ref 39–117)
Bilirubin, Direct: <0.1 mg/dL (ref 0.0–0.3)
Total Bilirubin: 0.3 mg/dL (ref 0.3–1.2)

## 2011-09-16 MED ORDER — OXYCODONE HCL 5 MG PO TABS
5.0000 mg | ORAL_TABLET | ORAL | Status: DC | PRN
  Administered 2011-09-16 – 2011-09-17 (×4): 5 mg via ORAL
  Filled 2011-09-16 (×4): qty 1

## 2011-09-16 NOTE — Progress Notes (Signed)
Subjective:  Mild, dull pain left lateral chest. Overall feeling better. No dyspnea or other complaints.  Objective:  Vital Signs in the last 24 hours: Temp:  [98.1 F (36.7 C)-99.5 F (37.5 C)] 98.7 F (37.1 C) (01/17 0354) Pulse Rate:  [71-87] 86  (01/16 2006) Resp:  [18-19] 18  (01/17 0012) BP: (100-118)/(53-71) 101/53 mmHg (01/17 0354) SpO2:  [91 %-100 %] 91 % (01/17 0354) Weight:  [80.1 kg (176 lb 9.4 oz)] 80.1 kg (176 lb 9.4 oz) (01/17 0500)  Intake/Output from previous day: 01/16 0701 - 01/17 0700 In: 888 [P.O.:840; I.V.:48] Out: 850 [Urine:850]  Physical Exam: Pt is alert and oriented, overweight woman in NAD HEENT: normal Neck: JVP - normal, carotids 2+= without bruits Lungs: CTA bilaterally CV: RRR without murmur or gallop Abd: soft, NT, Positive BS, no hepatomegaly Ext: no C/C/E, distal pulses intact and equal Skin: warm/dry no rash   Lab Results:  Basename 09/15/11 0530 09/14/11 0730  WBC 6.7 10.7*  HGB 11.4* 13.7  PLT 147* 180    Basename 09/15/11 0530 09/14/11 0730  NA 139 142  K 3.6 4.1  CL 107 108  CO2 24 23  GLUCOSE 112* 86  BUN 11 20  CREATININE 0.65 0.74    Basename 09/15/11 0530 09/14/11 2212  TROPONINI 1.30* 1.33*   Tele: Sinus rhythm  Assessment/Plan:  1. NSTEMI - prinzmetal's angina. Continue current med Rx with ASA, statin, nitrate, and Ca blocker. Transfer tele bed and mobilize today. Suspect residual discomfort is noncardiac.   2. Tobacco - quit Sept 2012. Reviewed importance of continued abstinence from cigarettes in setting vasospasm.  Tonny Bollman, M.D. 09/16/2011, 7:24 AM

## 2011-09-16 NOTE — Progress Notes (Signed)
Pt states she quit her tobacco use back in September of 2012. Congratulated and encouraged pt to remain tobacco free. Discussed relapse prevention strategies. Referred pt to 1-800-quit-now for f/u. Reviewed and gave pt education/contact information as well as discussing the e- cigarette.

## 2011-09-16 NOTE — Progress Notes (Signed)
CARDIAC REHAB PHASE I   PRE:  Rate/Rhythm: 71 SR    BP: sitting 100/52    SaO2:   MODE:  Ambulation: 420 ft   POST:  Rate/Rhythm: 90 SR    BP: sitting 110/60     SaO2:   Upon entering pt sts she just awoke with chest tightness. Sts it was 6/10 and went away within 30 sec. Happened again during walk. Also developed HA and lightheadedness during walk. O/w did fine. Gave ex gl for home and discussed NTG. Wants to do [facility]. [id number]  Caroline [name] CES, ACSM

## 2011-09-16 NOTE — Progress Notes (Signed)
     Caroline [name] Daily Rounding Note 09/16/2011, 9:59 AM  SUBJECTIVE: Overall better but still with left chest and upper abdominal soreness, mid back soreness. C/o no bowel movement in 2 days but no abdominal pain, bloating    OBJECTIVE:   General: calmer, NAD  Vital signs in last 24 hours: Temp:  [98.1 F (36.7 C)-99.5 F (37.5 C)] 98.4 F (36.9 C) (01/17 0748) Pulse Rate:  [69-87] 69  (01/17 0748) Resp:  [18-19] 18  (01/17 0748) BP: (101-118)/(53-71) 104/56 mmHg (01/17 0748) SpO2:  [91 %-100 %] 91 % (01/17 0748) Weight:  [176 lb 9.4 oz (80.1 kg)] 176 lb 9.4 oz (80.1 kg) (01/17 0500)    Heart: S1S2 no murmur Chest: clear Abdomen: soft and nontender, obese  Extremities: no edema Neuro/Psych:  Calmer, alert, appropriate affect  Intake/Output from previous day: 01/16 0701 - 01/17 0700 In: 888 [P.O.:840; I.V.:48] Out: 850 [Urine:850]   Lab Results:  Basename 09/15/11 0530 09/14/11 0730  WBC 6.7 10.7*  HGB 11.4* 13.7  HCT 34.4* 41.8  PLT 147* 180   BMET  Basename 09/15/11 0530 09/14/11 0730  NA 139 142  K 3.6 4.1  CL 107 108  CO2 24 23  GLUCOSE 112* 86  BUN 11 20  CREATININE 0.65 0.74  CALCIUM 8.3* 8.6   LFT  Basename 09/16/11 0525  PROT 6.5  ALBUMIN 3.4*  AST 30  ALT 81*  ALKPHOS 71  BILITOT 0.3  BILIDIR <0.1  IBILI NOT CALCULATED   PT/INR  Basename 09/14/11 0730  LABPROT 13.8  INR 1.04     ASSESMENT:  1) Upper abdominal pain - I still think that most of this is/was her vasospastic angina. Some residual symptoms are partly musculoskeletal. She was not taking her meds for angina correctly - took 2 120 mg diltiazem at once in AM instead of bid, claiming that she felt worse at night if she took it in evening (second dose). ? Some side effects mixed in with her angina overall.    2) Abnormal transaminases - improved and I think related to angina/ischemia - not significant   3) IBS - always in the background of things    PLAN:  1) Stay  on daily PPI for now as probably has some GERD and see how she does with new cardiac meds 2) Follow-up LFT's in a few days or as outpatient ok 3) I can see her as an outpatient also - we will check her again   LOS: 2 days   Stan Head  09/16/2011, 9:59 AM  Pager [phone]

## 2011-09-17 ENCOUNTER — Other Ambulatory Visit: Payer: Self-pay

## 2011-09-17 DIAGNOSIS — I214 Non-ST elevation (NSTEMI) myocardial infarction: Secondary | ICD-10-CM

## 2011-09-17 DIAGNOSIS — K5900 Constipation, unspecified: Secondary | ICD-10-CM

## 2011-09-17 LAB — CARDIAC PANEL(CRET KIN+CKTOT+MB+TROPI): CK, MB: 1.5 ng/mL (ref 0.3–4.0)

## 2011-09-17 MED ORDER — KETOROLAC TROMETHAMINE 30 MG/ML IJ SOLN
INTRAMUSCULAR | Status: AC
  Administered 2011-09-17: 30 mg via INTRAVENOUS
  Filled 2011-09-17: qty 1

## 2011-09-17 MED ORDER — KETOROLAC TROMETHAMINE 30 MG/ML IJ SOLN
30.0000 mg | Freq: Three times a day (TID) | INTRAMUSCULAR | Status: AC
  Administered 2011-09-17 – 2011-09-18 (×3): 30 mg via INTRAVENOUS
  Filled 2011-09-17 (×2): qty 1

## 2011-09-17 MED ORDER — MAGNESIUM HYDROXIDE 400 MG/5ML PO SUSP
30.0000 mL | Freq: Once | ORAL | Status: AC
  Administered 2011-09-17: 30 mL via ORAL
  Filled 2011-09-17: qty 30

## 2011-09-17 MED ORDER — NAPROXEN 375 MG PO TABS
375.0000 mg | ORAL_TABLET | Freq: Two times a day (BID) | ORAL | Status: DC
  Administered 2011-09-17 – 2011-09-18 (×3): 375 mg via ORAL
  Filled 2011-09-17 (×6): qty 1

## 2011-09-17 NOTE — Progress Notes (Addendum)
     [facility] Daily Rounding Note 09/17/2011, 10:02 AM  SUBJECTIVE: Last BM was Monday, 5 days ago.  Normally daily BMs.  Left chest, radiating to left axilla pain continues, Naproxen started this AM.  No nausea.  Appetite decreased.  OBJECTIVE:   General: Looks tired and unwell.  Vital signs in last 24 hours: Temp:  [97.9 F (36.6 C)-98.6 F (37 C)] 98.6 F (37 C) (01/18 0500) Pulse Rate:  [68-86] 86  (01/18 0500) Resp:  [16-19] 18  (01/18 0500) BP: (107-127)/(57-77) 110/70 mmHg (01/18 0940) SpO2:  [94 %-96 %] 94 % (01/18 0500) Weight:  [79.153 kg (174 lb 8 oz)] 79.153 kg (174 lb 8 oz) (01/18 0500) Last BM Date: 09/13/11  Heart: RRR Chest: Clear.  Not SOB.  Tender in left chest. Abdomen: Soft, NT, ND.  Active BS  Extremities: no edema Neuro/Psych:  Looks depressed.  Affect flat.  Not confused.  Agree - same Iva Boop, MD, FACG   Intake/Output from previous day: 01/17 0701 - 01/18 0700 In: 240 [P.O.:240] Out: 400 [Urine:400]  ASSESMENT: 1.  Left chest/axillary/back pain.  Musculoskeletal.   2.  Coronary vasospasm with rise in cardiac enzymes.  3.   Elevated LFTs, normalizing.  Likely secondary to ischemia of vasospasm. GB is out. 4.  Constipation secondary to narcotics, inactivity, hospital environment.  Also has IBS  PLAN: 1.  Give milk of mag as she wants med for the constipation. 2.  Ok to discharge home today.  GI follow up PRN.  Continue daily Protonix at home and while inpt.   LOS: 3 days   Jennye Moccasin  09/17/2011, 10:02 AM Pager: [phone]   I have also seen and evaluated the patient. Agree with history (reviewed) and physical as above. Milk of magnesia "starting to work" Naproxen started for musculoskeletal symptoms left chest and axilla.  I am going to add some IV Toradol also - may work faster than naproxen - ? Short term "boost" to NSAID.  Call us if further ?'s  Thanks  Iva Boop, MD, [facility] [phone]  (pager) 09/17/2011 2:30 PM

## 2011-09-17 NOTE — Progress Notes (Signed)
At 21:00, patient began complaining of left sided chest pain, sharp, radiating across left chest from above to below breast.  Chest pain was associated with shortness of breath, light-headedness and nausea.  Vitals obtained were:  P 67, BP 139/72, R 18, SPO2 98% on room air.  EKG obtained; no ST-elevation or depression (sinus rhythm with first-degree AV block, otherwise normal EKG).  Nitroglycerin 0.4mg  SL was given x 1 with relief of chest pain and symptoms at 5 minutes.  Post-event vitals were:  P 68, BP 107/67, R 16, SPO2 98% on room air.  Noted for MD follow-up.

## 2011-09-17 NOTE — Progress Notes (Signed)
Patient Name: Caroline [name] Date of Encounter: 09/17/2011  Principal Problem:  *Coronary vasospasm Active Problems:  Abdominal pain, epigastric  CAD (coronary artery disease)  Tobacco abuse  Chest pain  History of esophagitis  IBS (irritable bowel syndrome)  Nonspecific elevation of levels of transaminase or lactic acid dehydrogenase (LDH)    SUBJECTIVE: Bad night, had left axillary pain and tenderness, still going on, very uncomfortable with it, no BM in 5 days. Generally feels bad.   OBJECTIVE  Filed Vitals:   09/16/11 1023 09/16/11 1700 09/16/11 2100 09/17/11 0500  BP:  107/57 117/66 127/77  Pulse: 68 82 79 86  Temp: 97.9 F (36.6 C) 98.2 F (36.8 C) 98.3 F (36.8 C) 98.6 F (37 C)  TempSrc:   Oral Oral  Resp: 16 16 19 18   Height:      Weight:    174 lb 8 oz (79.153 kg)  SpO2: 95% 96% 94% 94%    Intake/Output Summary (Last 24 hours) at 09/17/11 1610 Last data filed at 09/17/11 0700  Gross per 24 hour  Intake    240 ml  Output    400 ml  Net   -160 ml   Weight change: -2 lb 1.4 oz (-0.947 kg)  PHYSICAL EXAM  General: Well developed, well nourished, in mild distress. Head: Normocephalic, atraumatic.  Neck: Supple without bruits, no JVD. Lungs:  Resp regular and unlabored, few rales, no crackles. Heart: RRR no s3, s4, or murmurs. Abdomen: Soft, non-tender, non-distended, BS + x 4.  Extremities: No clubbing, cyanosis, edema.  MS: chest wall tenderness upper, lateral area near axilla Neuro: Alert and oriented X 3. Moves all extremities spontaneously. Psych: Normal affect.  LABS:  CBC: Basename 09/15/11 0530  WBC 6.7  NEUTROABS --  HGB 11.4*  HCT 34.4*  MCV 97.2  PLT 147*   Basic Metabolic Panel:  Basename 09/15/11 0530  NA 139  K 3.6  CL 107  CO2 24  GLUCOSE 112*  BUN 11  CREATININE 0.65  CALCIUM 8.3*  MG --  PHOS --   Liver Function Tests:  [facility] 09/16/11 0525  AST 30  ALT 81*  ALKPHOS 71  BILITOT 0.3  PROT 6.5  ALBUMIN  3.4*    Cardiac Enzymes:  Basename 09/17/11 0510 09/15/11 0530 09/14/11 2212  CKTOTAL 92 152 152  CKMB 1.5 7.6* 8.9*  CKMBINDEX -- -- --  TROPONINI 0.50* 1.30* 1.33*   Fasting Lipid Panel: Basename 09/15/11 0530  CHOL 151  HDL 58  LDLCALC 78  TRIG 76  CHOLHDL 2.6  LDLDIRECT --    TELE: SR Current Medications:   . aspirin EC  81 mg Oral Daily  . diltiazem  360 mg Oral Daily  . isosorbide mononitrate  60 mg Oral Daily  . pantoprazole  40 mg Oral Daily  . rosuvastatin  10 mg Oral q1800  . sodium chloride  3 mL Intravenous Q12H    ASSESSMENT AND PLAN: Patient Active Problem List  Diagnoses  . Abdominal pain, epigastric  . NSTEMI - ? Secondary to spasm, no ischemic sx, continue current Rx  . CAD (coronary artery disease)  . Tobacco abuse  . Chest pain - ? Costochondritis - NSAIDS.  Marland Kitchen Tachycardia  . History of esophagitis  . Coronary vasospasm  . IBS (irritable bowel syndrome)  . Nonspecific elevation of levels of transaminase or lactic acid dehydrogenase (LDH)       Signed, Theodore Demark , PA-C  Patient seen, examined. Available data reviewed. Agree with  findings, assessment, and plan as outlined by Theodore Demark, PA-C.  The patient continues to have pain over the left axillary region and chest. This pain is constant despite cardiac markers trending down. She also has chest wall tenderness reproducing her pain. Will try an NSAID and I suspect she will need a short course on discharge. Continue diltiazem and isosorbide for coronary vasospasm.  Tonny Bollman, M.D.

## 2011-09-17 NOTE — Progress Notes (Signed)
CARDIAC REHAB PHASE I   PRE:  Rate/Rhythm: 74 SR    BP: sitting 110/60    SaO2: 95 RA  MODE:  Ambulation: 760 ft   POST:  Rate/Rhythm: 76    BP: sitting 112/76     SaO2: 96 RA  C/o slight lightheadedness which she sts is much improved from 2 days ago. No pain while walking except due to constipation.  [id number]  Harriet Masson CES, ACSM

## 2011-09-18 DIAGNOSIS — R079 Chest pain, unspecified: Secondary | ICD-10-CM

## 2011-09-18 MED ORDER — ISOSORBIDE DINITRATE 30 MG PO TABS: 90.0000 mg | ORAL_TABLET | Freq: Every day | ORAL | Status: DC

## 2011-09-18 MED ORDER — DILTIAZEM HCL ER COATED BEADS 360 MG PO CP24: 360.0000 mg | ORAL_CAPSULE | Freq: Every day | ORAL | Status: DC

## 2011-09-18 MED ORDER — ISOSORBIDE MONONITRATE ER 60 MG PO TB24
90.0000 mg | ORAL_TABLET | Freq: Every day | ORAL | Status: DC
  Administered 2011-09-18: 90 mg via ORAL
  Filled 2011-09-18: qty 1

## 2011-09-18 NOTE — Progress Notes (Signed)
   SUBJECTIVE:  Chest pain last night.  Nursing notes reviewed. Currently no acute pain but a dull ache mild left upper chest  PHYSICAL EXAM Filed Vitals:   09/17/11 0940 09/17/11 1300 09/17/11 1500 09/18/11 0651  BP: 110/70 96/60 101/64 123/82  Pulse:  77  65  Temp:  97.7 F (36.5 C)  97.8 F (36.6 C)  TempSrc:    Oral  Resp:  16  20  Height:      Weight:    78.3 kg (172 lb 9.9 oz)  SpO2:  95%  96%   General:  No distress Lungs:  Clear Heart:  RRR, no rub, no murmur Abdomen:  Positive bowel sounds, no rebound no guarding Extremities:  No edema.  LABS: Lab Results  Component Value Date   CKTOTAL 92 09/17/2011   CKMB 1.5 09/17/2011   TROPONINI 0.50* 09/17/2011   No results found for this or any previous visit (from the past 24 hour(s)).  Intake/Output Summary (Last 24 hours) at 09/18/11 0942 Last data filed at 09/18/11 0900  Gross per 24 hour  Intake    600 ml  Output      0 ml  Net    600 ml    EKG:  09/17/11  21:06:22  NSR, rate 74, no acute change from previous.  ASSESSMENT AND PLAN:   1)  Coronary vasospasm:  Pain last night but enzymes continue to trend down.  I will increase the Imdur.  She might want to go home later this afternoon if she is ambulating and feeling OK.  She should have the SL NTG to take at home.   2)   Abdominal pain, epigastric/IBS/Constipation:  The patient was seen by Dr. Leone Payor.  No further in patient work up is planned they have signed off.     Fayrene Fearing [facility] 09/18/2011 9:42 AM

## 2011-09-18 NOTE — Discharge Summary (Signed)
Discharge Summary   Patient ID: Caroline [name] MRN: [id number], DOB/AGE: Jun 02, 1961 51 y.o. Admit date: 09/14/2011 D/C date:     09/18/2011   Primary Discharge Diagnoses:  1) Coronary vasospasm      - Imdur increased 2) NSTEMI       - secondary to #1      - troponin trending down  3) Abdominal pain/Gerd      - Continue protonix 4) Abnormal transaminases     - resolving prior to discharge  Secondary Discharge Diagnoses:  1) History of tobacco abuse, quit Sept 2012 2) Hyperlipidemia   Diagnostics: (please see reports for full details) 1)  Left Heart Catheterization: Coronary vasospasm, Mild CAD, Preserved LV systolic function  Hospital Course:   Caroline [name] is a 50 y.o. female with history of coronary vasospasm documented on cath in 05/2011, HTN, and HL.  She presented to the ER with abdominal discomfort that awoke her from sleep, N and V.  In the ER she began to experience chest pain associated with bilateral arm pain that was similar to prior angina.  ECG showed infralateral ST elevation along with anterior ST depression and TWI.  With continued chest pain she was taken for urgent catheterization.  This showed 99% mid stenosis in the circumflex, however, after NTG given this area responded well with minimal 20% stenosis. She had mild CAD otherwise.  Her medications have been altered, metoprolol was discontinued and Imdur increased as well as diltiazem increased.  Troponin did peak to 1.33 but was on a downward trend by discharge.  She tolerated these changes.  She was ambulating in the hall on day of discharge without chest discomfort.    She has had intermittent epigastric pain that she had been doubling up on her PPI at home.  Dr. Leone Payor was consulted while she was in the hospital he felt most of her abdominal pain was secondary to her vasospasms.  LFTs were noted to be elevated on initial labs but Dr. Leone Payor felt this was related to vasospastic angina and they were resolving by  discharge.  (AST: 193 to 30 and ALT 202 to 81) He recommended daily PPI with further follow up in the outpatient setting.    On day of discharge, Dr. Antoine Poche evaluated the patient and noted her stable for home if she had no further chest pain.  She was ambulating in the halls without difficulty.  Her medications changes were discussed in full with her and her husband.   Discharge Vitals: Blood pressure 99/62, pulse 77, temperature 98.1 F (36.7 C), temperature source Oral, resp. rate 18, height 5\' 4"  (1.626 m), weight 172 lb 9.9 oz (78.3 kg), SpO2 97.00%.  Labs: Lab Results  Component Value Date   WBC 6.7 09/15/2011   HGB 11.4* 09/15/2011   HCT 34.4* 09/15/2011   MCV 97.2 09/15/2011   PLT 147* 09/15/2011     Lab 09/16/11 0525 09/15/11 0530  NA -- 139  K -- 3.6  CL -- 107  CO2 -- 24  BUN -- 11  CREATININE -- 0.65  CALCIUM -- 8.3*  PROT 6.5 --  BILITOT 0.3 --  ALKPHOS 71 --  ALT 81* --  AST 30 --  GLUCOSE -- 112*    Basename 09/17/11 0510  CKTOTAL 92  CKMB 1.5  TROPONINI 0.50*   Lab Results  Component Value Date   CHOL 151 09/15/2011   HDL 58 09/15/2011   LDLCALC 78 09/15/2011   TRIG 76 09/15/2011   Lab  Results  Component Value Date   DDIMER 0.35 06/01/2011    Discharge Medications   Current Discharge Medication List    START taking these medications   Details  diltiazem (CARDIZEM CD) 360 MG 24 hr capsule Take 1 capsule (360 mg total) by mouth daily. Qty: 30 capsule, Refills: 6      CONTINUE these medications which have CHANGED   Details  isosorbide dinitrate (ISORDIL) 30 MG tablet Take 3 tablets (90 mg total) by mouth daily. Qty: 90 tablet, Refills: 6      CONTINUE these medications which have NOT CHANGED   Details  aspirin 81 MG tablet Take 81 mg by mouth daily.      atorvastatin (LIPITOR) 20 MG tablet Take 1 tablet (20 mg total) by mouth daily. Qty: 30 tablet, Refills: 11    naproxen sodium (ALEVE) 220 MG tablet Take 220 mg by mouth as needed.       nitroGLYCERIN (NITROSTAT) 0.4 MG SL tablet Place 0.4 mg under the tongue every 5 (five) minutes as needed.      pantoprazole (PROTONIX) 40 MG tablet Take 40 mg by mouth daily.        STOP taking these medications     diltiazem (CARDIZEM) 120 MG tablet      metoprolol tartrate (LOPRESSOR) 25 MG tablet         Disposition   The patient will be discharged in stable condition to home. Discharge Orders    Future Appointments: Provider: Department: Dept Phone: Center:   10/01/2011 9:00 AM Rosalio Macadamia, NP [facility] [phone] None   11/18/2011 3:30 PM Cassell Clement, MD [facility] [phone] None     Future Orders Please Complete By Expires   Diet - low sodium heart healthy      Increase activity slowly      Call MD for:  persistant dizziness or light-headedness        Follow-up Information    Follow up with Kristian Covey, MD. (as scheduled)       Follow up with Stan Head, MD. (As needed)    Contact information:   [address] [facility] [address] [place] [address] [phone]       Follow up with Norma Fredrickson, NP on 10/01/2011. (9:00)    Contact information:   [address] [place] [address] [phone]            Duration of Discharge Encounter: Greater than 30 minutes including physician and PA time.  Signed, Robbi Garter PA-C 09/18/2011, 4:17 PM  Patient seen and examined.  Plan as discussed in my rounding note for today and outlined above. Fayrene Fearing [facility]  09/20/2011  9:54 AM

## 2011-09-18 NOTE — Progress Notes (Signed)
Patient awoke from sleep with 7/10 chest pain radiating to back and left shoulder.  Called RN to room.  Vitals obtained were:  P 73, BP 112/79, SPO2 94% on room air.  Gave NTG 0.4mg  SL x 1 and Morphine 2mg  IV with relief at 5 minutes.  Post-event vitals were:  P 70, BP 107/67, SPO2 93% on room air.  Paged Dr. Margo Aye, cardiology on-call for suggestions given that patient is continuing to experience chest pain despite current prescribed medications and interventions.  MD chart review in process.  Continuing to monitor closely.

## 2011-09-18 NOTE — Progress Notes (Signed)
CARDIAC REHAB PHASE I   PRE:  Rate/Rhythm:   BP:  Supine:   Sitting:   Standing:    SaO2:   MODE:  Ambulation:  ft   POST:  Rate/Rhythem:   BP:  Supine:   Sitting:   Standing:    SaO2:   1500  Patient ambulating in hallway independently  Jackey Loge

## 2011-09-28 ENCOUNTER — Telehealth: Payer: Self-pay | Admitting: Cardiology

## 2011-09-28 DIAGNOSIS — IMO0001 Reserved for inherently not codable concepts without codable children: Secondary | ICD-10-CM

## 2011-09-28 MED ORDER — AZITHROMYCIN 250 MG PO TABS: ORAL_TABLET | ORAL | Status: AC

## 2011-09-28 NOTE — Telephone Encounter (Signed)
Went to pharmacy and did get plain Mucinex but she states the cough is very intense.  States that it actually triggered chest pains and had to use NTG.  Is coughing up colored sputum at times and in am blowing out colored as well.  Should just use Mucinex or Rx antibiotic?

## 2011-09-28 NOTE — Telephone Encounter (Signed)
New Problem:     Patient called in wondering what she could take, that won't conflict with her current medications, to help with the cough and congestion that she has. Please call back.

## 2011-09-28 NOTE — Telephone Encounter (Signed)
F/U   Patient had heart attack a couple weeks ago, currently has upper respiratory infection and wants to know what she can take.  Patient needs recommendation so she can pick up something on her lunch hour, she can be reached on  [phone] mobile #

## 2011-09-28 NOTE — Telephone Encounter (Signed)
Continue Mucinex and add Z-Pak

## 2011-09-28 NOTE — Telephone Encounter (Signed)
Advised patient

## 2011-09-29 ENCOUNTER — Encounter: Payer: Self-pay | Admitting: Cardiology

## 2011-09-29 ENCOUNTER — Ambulatory Visit (INDEPENDENT_AMBULATORY_CARE_PROVIDER_SITE_OTHER): Payer: Managed Care, Other (non HMO) | Admitting: Cardiology

## 2011-09-29 ENCOUNTER — Encounter: Payer: Self-pay | Admitting: *Deleted

## 2011-09-29 ENCOUNTER — Telehealth: Payer: Self-pay | Admitting: Cardiology

## 2011-09-29 VITALS — BP 110/70 | HR 88 | Ht 64.0 in | Wt 165.0 lb

## 2011-09-29 DIAGNOSIS — R51 Headache: Secondary | ICD-10-CM

## 2011-09-29 DIAGNOSIS — I201 Angina pectoris with documented spasm: Secondary | ICD-10-CM

## 2011-09-29 DIAGNOSIS — J40 Bronchitis, not specified as acute or chronic: Secondary | ICD-10-CM

## 2011-09-29 NOTE — Assessment & Plan Note (Signed)
He continues to have intermittent substernal chest pressure relieved by sublingual nitroglycerin consistent with her known coronary vasospasm.  Will continue same medication.  She is still a nonsmoker.

## 2011-09-29 NOTE — Telephone Encounter (Signed)
New Msg: pt calling wanting to speak with MD/nurse regarding pt current health condition. Pt c/o severe headache and sweating when pt gets up. Pt said she was sick starting Monday and pt got a z-pak called in; pt said she is also taking musinex. Pt said when she woke up this morning she was in a cold sweat. Pt said every time she stands up she breaks out in a sweat like she is going to pass out. Pt wants to know if she should see MD. Please return pt call to discuss further.

## 2011-09-29 NOTE — Telephone Encounter (Signed)
Scheduled appointment to see  Dr. Patty Sermons today

## 2011-09-29 NOTE — Telephone Encounter (Signed)
Seen for office visit and check orthostatic BPs and an EKG.

## 2011-09-29 NOTE — Patient Instructions (Addendum)
Rx for Tussionex called into [facility] pharmacy See Lawson Fiscal for follow up in 2 weeks  IF WORSE GO TO THE EMERGENCY ROOM

## 2011-09-29 NOTE — Assessment & Plan Note (Signed)
The patient has been running a low-grade fever until today.  Her sputum has been dark but has lightened up today.  She's had a lot of chest wall soreness from hard coughing.  Her cough is keeping her awake at night and we will give her a prescription for Tussionex.  She should finish out her course of Zithromax and continue on Mucinex.

## 2011-09-29 NOTE — Progress Notes (Signed)
[name] Date of Birth:  1961/02/25 [facility] [address] [place], [place]  [address] [phone]  Fax   [phone]  HPI: This pleasant 51 year old woman is seen for a work in the office visit.  She has had a severe head and chest cold for the past several days.  She has been coughing up yellow sputum.  She has been on Zithromax for 2 days.  She is been having severe headaches and also chest pain secondary to heart coughing.  She has a past history of Prinzmetal angina and did have to take a sublingual nitroglycerin yesterday which helped.  Her cough is severe and has been keeping her awake at night.  She has felt dizzy and has felt like she is about to black out when she stands up.  Current Outpatient Prescriptions  Medication Sig Dispense Refill  . aspirin 81 MG tablet Take 81 mg by mouth daily.        Marland Kitchen atorvastatin (LIPITOR) 20 MG tablet Take 1 tablet (20 mg total) by mouth daily.  30 tablet  11  . azithromycin (ZITHROMAX) 250 MG tablet Take 2 tablets (500 mg) on  Day 1,  followed by 1 tablet (250 mg) once daily on Days 2 through 5.  6 each  0  . diltiazem (CARDIZEM CD) 360 MG 24 hr capsule Take 1 capsule (360 mg total) by mouth daily.  30 capsule  6  . isosorbide dinitrate (ISORDIL) 30 MG tablet Take 3 tablets (90 mg total) by mouth daily.  90 tablet  6  . naproxen sodium (ALEVE) 220 MG tablet Take 220 mg by mouth as needed.       . nitroGLYCERIN (NITROSTAT) 0.4 MG SL tablet Place 0.4 mg under the tongue every 5 (five) minutes as needed.        . pantoprazole (PROTONIX) 40 MG tablet Take 40 mg by mouth daily.          No Known Allergies  Patient Active Problem List  Diagnoses  . Abdominal pain, epigastric  . Diarrhea  . CAD (coronary artery disease)  . Tobacco abuse  . Chest pain  . Tachycardia  . History of esophagitis  . Coronary vasospasm  . IBS (irritable bowel syndrome)  . Nonspecific elevation of levels of transaminase or lactic acid  dehydrogenase (LDH)  . Constipation, acute    History  Smoking status  . Former Smoker -- 0.2 packs/day for 30 years  . Types: Cigarettes  . Quit date: 05/12/2011  Smokeless tobacco  . Never Used    History  Alcohol Use No    prev 4-5 per week.  Currently not drinking (08/2011)    Family History  Problem Relation Age of Onset  . Breast cancer Mother 109  . Heart disease Mother     died CHF 30  . Diabetes Mother   . Prostate cancer Father   . Hyperlipidemia Father   . Heart disease Father     s/p cabg - alive  . Colon cancer      First cousin on dads side     Review of Systems: The patient denies any heat or cold intolerance.  No weight gain or weight loss.  The patient denies headaches or blurry vision.  There is no cough or sputum production.  The patient denies dizziness.  There is no hematuria or hematochezia.  The patient denies any muscle aches or arthritis.  The patient denies any rash.  The patient denies frequent falling  or instability.  There is no history of depression or anxiety.  All other systems were reviewed and are negative.   Physical Exam: Filed Vitals:   09/29/11 1547  BP: 110/70  Pulse: 88   General appearance reveals a well-developed well-nourished woman who is in moderate distress and is coughing repeatedly.Pupils equal and reactive.   Extraocular Movements are full.  There is no scleral icterus.  The mouth and pharynx are normal.  The neck is supple.  The carotids reveal no bruits.  The jugular venous pressure is normal.  The thyroid is not enlarged.  There is no lymphadenopathy.  Her blood pressure is 100/60 sitting and standing.  The chest reveals scattered rhonchi and expiratory wheezes throughout both lung fields.The precordium is quiet.  The first heart sound is normal.  The second heart sound is physiologically split.  There is no murmur gallop rub or click.  There is no abnormal lift or heave.  The abdomen is soft and nontender. Bowel sounds  are normal. The liver and spleen are not enlarged. There Are no abdominal masses. There are no bruits.  The pedal pulses are good.  There is no phlebitis or edema.  There is no cyanosis or clubbing. Strength is normal and symmetrical in all extremities.  There is no lateralizing weakness.  There are no sensory deficits.     Assessment / Plan: Patient is to continue her Zithromax and her Mucinex.  We are adding Tussionex 5 cc every 12 hours when necessary.  She is to stay out of work the rest of this week and return to work on Monday, 10/04/2011.  She will cancel her appointment with Lawson Fiscal  February 1 and we plan to have pleurisy her in about 2 weeks

## 2011-10-01 ENCOUNTER — Ambulatory Visit: Payer: Managed Care, Other (non HMO) | Admitting: Nurse Practitioner

## 2011-10-05 ENCOUNTER — Ambulatory Visit: Payer: PRIVATE HEALTH INSURANCE | Admitting: Internal Medicine

## 2011-10-13 ENCOUNTER — Encounter: Payer: Self-pay | Admitting: Nurse Practitioner

## 2011-10-13 ENCOUNTER — Ambulatory Visit (INDEPENDENT_AMBULATORY_CARE_PROVIDER_SITE_OTHER): Payer: Managed Care, Other (non HMO) | Admitting: Nurse Practitioner

## 2011-10-13 VITALS — BP 90/72 | HR 107 | Ht 64.0 in | Wt 164.0 lb

## 2011-10-13 DIAGNOSIS — R7401 Elevation of levels of liver transaminase levels: Secondary | ICD-10-CM

## 2011-10-13 DIAGNOSIS — F17200 Nicotine dependence, unspecified, uncomplicated: Secondary | ICD-10-CM

## 2011-10-13 DIAGNOSIS — I201 Angina pectoris with documented spasm: Secondary | ICD-10-CM

## 2011-10-13 DIAGNOSIS — Z720 Tobacco use: Secondary | ICD-10-CM

## 2011-10-13 DIAGNOSIS — R7402 Elevation of levels of lactic acid dehydrogenase (LDH): Secondary | ICD-10-CM

## 2011-10-13 DIAGNOSIS — R079 Chest pain, unspecified: Secondary | ICD-10-CM

## 2011-10-13 MED ORDER — ISOSORBIDE DINITRATE 30 MG PO TABS: 30.0000 mg | ORAL_TABLET | Freq: Three times a day (TID) | ORAL | Status: DC

## 2011-10-13 NOTE — Assessment & Plan Note (Signed)
Will need to recheck on return if not done by GI.  

## 2011-10-13 NOTE — Patient Instructions (Signed)
Take the Isordil 3 times a day.  I will see you in about 1 week.  Call the [facility] office at [phone] if you have any questions, problems or concerns.

## 2011-10-13 NOTE — Assessment & Plan Note (Signed)
She has documented vasospasm. She continues with early morning chest discomfort responsive to NTG. I have changed the Isordil to 30 mg TID. I will see her back in a week. May need to consider Ranexa and will get Dr. Yevonne Pax input. She is not smoking. She is not using caffeine. Patient is agreeable to this plan and will call if any problems develop in the interim.

## 2011-10-13 NOTE — Progress Notes (Signed)
Caroline [name] Date of Birth: [birth year] Medical Record #[id number]  History of Present Illness: Caroline [name] is seen back today for a post hospital visit. She is seen for Caroline [name]. She has documented coronary vasospasm. She has had another significant spell last month. She had positive troponin. Emergent cath showed 99% LCX stenosis. She ws given IC NTG this resolved and was noted to only have a 20% narrowing. Her medicines were changed. Metoprolol was stopped. Imdur and diltiazem were increased. She is not smoking.   She comes in today. She is doing ok. Still with chest discomfort in the early morning hours. She is actually on 90 mg of Isordil and taking it once a day. She is using NTG almost every morning. Blood pressure is on the low side. Still recovering from a URI.   Current Outpatient Prescriptions on File Prior to Visit  Medication Sig Dispense Refill  . aspirin 81 MG tablet Take 81 mg by mouth daily.        Marland Kitchen atorvastatin (LIPITOR) 20 MG tablet Take 1 tablet (20 mg total) by mouth daily.  30 tablet  11  . diltiazem (CARDIZEM CD) 360 MG 24 hr capsule Take 1 capsule (360 mg total) by mouth daily.  30 capsule  6  . naproxen sodium (ALEVE) 220 MG tablet Take 220 mg by mouth as needed.       . nitroGLYCERIN (NITROSTAT) 0.4 MG SL tablet Place 0.4 mg under the tongue every 5 (five) minutes as needed.        . pantoprazole (PROTONIX) 40 MG tablet Take 40 mg by mouth daily.        Marland Kitchen DISCONTD: isosorbide dinitrate (ISORDIL) 30 MG tablet Take 3 tablets (90 mg total) by mouth daily.  90 tablet  6    No Known Allergies  Past Medical History  Diagnosis Date  . GERD (gastroesophageal reflux disease)   . Hyperlipidemia   . Hx of cholecystectomy   . Hx of appendectomy   . CAD (coronary artery disease)     has documented coronary vasospasm but with underlying 40% prox LAD, 30% prox LCX and 70 to 80% proximal 1st DX and 30% RCA; recurrent vasospasm with subsequent cath in Jan 2013.   Marland Kitchen  Palpitations     tachycardia on holter; treated with beta blockers  . Coronary vasospasm   . Esophagitis   . IBS (irritable bowel syndrome)     Past Surgical History  Procedure Date  . Cholecystectomy 1982  . Appendectomy 1976  . Breast surgery 2002    biopsy  . Cardiac catheterization 2011  . Upper gastrointestinal endoscopy 01/11/2011    esophagitis  . Cardiac catheterization Sept 2012    With documented vasospasm but with residual disease; managed medically    History  Smoking status  . Former Smoker -- 0.2 packs/day for 30 years  . Types: Cigarettes  . Quit date: 05/12/2011  Smokeless tobacco  . Never Used    History  Alcohol Use No    prev 4-5 per week.  Currently not drinking (08/2011)    Family History  Problem Relation Age of Onset  . Breast cancer Mother 72  . Heart disease Mother     died CHF 40  . Diabetes Mother   . Prostate cancer Father   . Hyperlipidemia Father   . Heart disease Father     s/p cabg - alive  . Colon cancer      First cousin on dads side  Review of Systems: The review of systems is positive for early morning angina that is responsive to NTG.  All other systems were reviewed and are negative.  Physical Exam: BP 90/72  Pulse 107  Ht 5\' 4"  (1.626 m)  Wt 164 lb (74.39 kg)  BMI 28.15 kg/m2 Patient is very pleasant and in no acute distress. Skin is warm and dry. Color is normal.  HEENT is unremarkable. Normocephalic/atraumatic. PERRL. Sclera are nonicteric. Neck is supple. No masses. No JVD. Lungs are clear. Cardiac exam shows a regular rate and rhythm. Abdomen is soft. Extremities are without edema. Gait and ROM are intact. No gross neurologic deficits noted.  LABORATORY DATA: EKG with sinus tachycardia   Assessment / Plan:

## 2011-10-13 NOTE — Assessment & Plan Note (Signed)
Not smoking.  

## 2011-10-20 ENCOUNTER — Ambulatory Visit (INDEPENDENT_AMBULATORY_CARE_PROVIDER_SITE_OTHER): Payer: Managed Care, Other (non HMO) | Admitting: Nurse Practitioner

## 2011-10-20 ENCOUNTER — Encounter: Payer: Self-pay | Admitting: Nurse Practitioner

## 2011-10-20 VITALS — BP 110/78 | HR 82 | Ht 64.0 in | Wt 164.0 lb

## 2011-10-20 DIAGNOSIS — Z720 Tobacco use: Secondary | ICD-10-CM

## 2011-10-20 DIAGNOSIS — R7402 Elevation of levels of lactic acid dehydrogenase (LDH): Secondary | ICD-10-CM

## 2011-10-20 DIAGNOSIS — R7401 Elevation of levels of liver transaminase levels: Secondary | ICD-10-CM

## 2011-10-20 DIAGNOSIS — I201 Angina pectoris with documented spasm: Secondary | ICD-10-CM

## 2011-10-20 DIAGNOSIS — F17200 Nicotine dependence, unspecified, uncomplicated: Secondary | ICD-10-CM

## 2011-10-20 NOTE — Assessment & Plan Note (Signed)
Not smoking.  

## 2011-10-20 NOTE — Patient Instructions (Addendum)
Stay on your current medicines.  We will see you next month.   Call the [facility] office at [phone] if you have any questions, problems or concerns.

## 2011-10-20 NOTE — Progress Notes (Signed)
[name] Date of Birth: 1961-06-06 Medical Record #[id number]  History of Present Illness: Caroline [name] is seen back today for a one week check. She is seen for Dr. Patty Sermons. She has documented coronary vasospasm and has had recurrent hospitalization with emergent cath. She continues with medical management. She had been taking all of her Isordil and one time and was still waking up early each morning with chest pain. We split dosed her medicine.   She comes back today. She is doing much better. Has only used sl NTG x 2 which is a significant improvement. She feels much better on the TID dosing of her Isordil.   Current Outpatient Prescriptions on File Prior to Visit  Medication Sig Dispense Refill  . aspirin 81 MG tablet Take 81 mg by mouth daily.        Marland Kitchen atorvastatin (LIPITOR) 20 MG tablet Take 1 tablet (20 mg total) by mouth daily.  30 tablet  11  . diltiazem (CARDIZEM CD) 360 MG 24 hr capsule Take 1 capsule (360 mg total) by mouth daily.  30 capsule  6  . isosorbide dinitrate (ISORDIL) 30 MG tablet Take 1 tablet (30 mg total) by mouth 3 (three) times daily.  90 tablet  6  . naproxen sodium (ALEVE) 220 MG tablet Take 220 mg by mouth as needed.       . nitroGLYCERIN (NITROSTAT) 0.4 MG SL tablet Place 0.4 mg under the tongue every 5 (five) minutes as needed.        . pantoprazole (PROTONIX) 40 MG tablet Take 40 mg by mouth daily.          No Known Allergies  Past Medical History  Diagnosis Date  . GERD (gastroesophageal reflux disease)   . Hyperlipidemia   . Hx of cholecystectomy   . Hx of appendectomy   . CAD (coronary artery disease)     has documented coronary vasospasm but with underlying 40% prox LAD, 30% prox LCX and 70 to 80% proximal 1st DX and 30% RCA; recurrent vasospasm with subsequent cath in Jan 2013.   Marland Kitchen Palpitations     tachycardia on holter; treated with beta blockers  . Coronary vasospasm   . Esophagitis   . IBS (irritable bowel syndrome)     Past Surgical  History  Procedure Date  . Cholecystectomy 1982  . Appendectomy 1976  . Breast surgery 2002    biopsy  . Cardiac catheterization 2011  . Upper gastrointestinal endoscopy 01/11/2011    esophagitis  . Cardiac catheterization Sept 2012    With documented vasospasm but with residual disease; managed medically    History  Smoking status  . Former Smoker -- 0.2 packs/day for 30 years  . Types: Cigarettes  . Quit date: 05/12/2011  Smokeless tobacco  . Never Used    History  Alcohol Use No    prev 4-5 per week.  Currently not drinking (08/2011)    Family History  Problem Relation Age of Onset  . Breast cancer Mother 33  . Heart disease Mother     died CHF 42  . Diabetes Mother   . Prostate cancer Father   . Hyperlipidemia Father   . Heart disease Father     s/p cabg - alive  . Colon cancer      First cousin on dads side     Review of Systems: The review of systems is per the HPI.  All other systems were reviewed and are negative.  Physical Exam: There were  no vitals taken for this visit. Patient is very pleasant and in no acute distress. Skin is warm and dry. Color is normal.  HEENT is unremarkable. Normocephalic/atraumatic. PERRL. Sclera are nonicteric. Neck is supple. No masses. No JVD. Lungs are clear. Cardiac exam shows a regular rate and rhythm. Abdomen is soft. Extremities are without edema. Gait and ROM are intact. No gross neurologic deficits noted.  LABORATORY DATA: N/A   Assessment / Plan:

## 2011-10-20 NOTE — Assessment & Plan Note (Signed)
She is much better clinically with the TID dosing of her Isordil. Could use Ranexa in the future if needed. Will see her back next month as scheduled. Patient is agreeable to this plan and will call if any problems develop in the interim.

## 2011-10-20 NOTE — Assessment & Plan Note (Signed)
Will need to recheck on return if not done by GI.

## 2011-11-03 ENCOUNTER — Other Ambulatory Visit: Payer: Self-pay | Admitting: Cardiology

## 2011-11-05 ENCOUNTER — Telehealth: Payer: Self-pay | Admitting: Cardiology

## 2011-11-05 MED ORDER — RANOLAZINE ER 500 MG PO TB12: 500.0000 mg | ORAL_TABLET | Freq: Two times a day (BID) | ORAL | Status: DC

## 2011-11-05 NOTE — Telephone Encounter (Signed)
NA  -  Lm for pt to call back about chest pain.

## 2011-11-05 NOTE — Telephone Encounter (Signed)
Pt is reporting sharp left sided chest pain radiating into left arm more often during the past two days.  She had it 6-7 times yesterday and 4 times today.  She gets it both with exertion and with rest.  The pain is accompanied by sob and sweating.  It is relieved by 1-3 nitro.  She had an uri a month ago and was treated with a zpack.

## 2011-11-05 NOTE — Telephone Encounter (Signed)
Pt to start Ranexa per Dr Patty Sermons, she was notified and rx was called to [facility] on [address] per her request.

## 2011-11-05 NOTE — Telephone Encounter (Signed)
Pt has been having off and on chest pain and nausea since yesterday and since it has started she has taken about 5 nitro and she needs to talk to someone

## 2011-11-05 NOTE — Telephone Encounter (Signed)
This has been taken care of by Dr. Patty Sermons. To start Ranexa.

## 2011-11-08 ENCOUNTER — Telehealth: Payer: Self-pay | Admitting: Cardiology

## 2011-11-08 ENCOUNTER — Encounter: Payer: Self-pay | Admitting: Cardiology

## 2011-11-08 ENCOUNTER — Ambulatory Visit: Payer: Managed Care, Other (non HMO) | Admitting: Nurse Practitioner

## 2011-11-08 ENCOUNTER — Ambulatory Visit (INDEPENDENT_AMBULATORY_CARE_PROVIDER_SITE_OTHER): Payer: Managed Care, Other (non HMO) | Admitting: Cardiology

## 2011-11-08 DIAGNOSIS — I2510 Atherosclerotic heart disease of native coronary artery without angina pectoris: Secondary | ICD-10-CM

## 2011-11-08 DIAGNOSIS — R079 Chest pain, unspecified: Secondary | ICD-10-CM

## 2011-11-08 DIAGNOSIS — I201 Angina pectoris with documented spasm: Secondary | ICD-10-CM

## 2011-11-08 NOTE — Telephone Encounter (Signed)
Pt states she is still having the dull pain radiating down left arm and into back.  She started the Ranexa on Friday which helped a little.  The pain is about the same but her sob is worse.

## 2011-11-08 NOTE — Patient Instructions (Signed)
FOLLOW UP WITH DR BRACKBILL AS SCHEDULED

## 2011-11-08 NOTE — Assessment & Plan Note (Signed)
Patient presents with complaints of left upper chest pain. This pain is different than her symptoms when she experienced vasospasm. I am not convinced it is cardiac. Her electrocardiogram shows no ST changes. We will continue with her present dose of Cardizem and nitrates. Ranexa was also recently added to her medical regimen. Hopefully this will help improve symptoms. She will followup with Dr. Patty Sermons as scheduled on March 21.

## 2011-11-08 NOTE — Telephone Encounter (Signed)
Spoke with patient and she is seeing Lawson Fiscal NP this afternoon.  Advise to go to ED if worse

## 2011-11-08 NOTE — Progress Notes (Signed)
HPI: Patient of Dr. Yevonne Pax for FU of CAD. She has documented coronary vasospasm.  Cardiac catheterization in January of 2013 showed a normal left main but intense vasospasm. There was also spasm in the LAD. The diagonal had an ostial 50% lesion unchanged from previous cath. There was a 99% mid circumflex that responded well to nitroglycerin improving to 20%. There was a 30% OM1 and a 30% RCA. Ejection fraction was 55%. Patient is being managed medically. She had Ranexa added to her medical regimen on March 8. She presents today describing pain in her left upper chest. It is described as a dull sensation lasting 5-8 seconds at a time. Nonexertional, pleuritic or positional. Different then her pain in the emergency room when she had ST elevation followed by catheterization demonstrating vasospasm. There is no dyspnea on exertion.  Current Outpatient Prescriptions  Medication Sig Dispense Refill  . aspirin 81 MG tablet Take 81 mg by mouth daily.        Marland Kitchen atorvastatin (LIPITOR) 20 MG tablet Take 1 tablet (20 mg total) by mouth daily.  30 tablet  11  . diltiazem (CARDIZEM CD) 360 MG 24 hr capsule Take 1 capsule (360 mg total) by mouth daily.  30 capsule  6  . isosorbide dinitrate (ISORDIL) 30 MG tablet Take 1 tablet (30 mg total) by mouth 3 (three) times daily.  90 tablet  6  . naproxen sodium (ALEVE) 220 MG tablet Take 220 mg by mouth as needed.       . nitroGLYCERIN (NITROSTAT) 0.4 MG SL tablet Place 0.4 mg under the tongue every 5 (five) minutes as needed.        . pantoprazole (PROTONIX) 40 MG tablet TAKE ONE TABLET BY MOUTH EVERY DAY  30 tablet  8  . ranolazine (RANEXA) 500 MG 12 hr tablet Take 1 tablet (500 mg total) by mouth 2 (two) times daily.  28 tablet  1     Past Medical History  Diagnosis Date  . GERD (gastroesophageal reflux disease)   . Hyperlipidemia   . Hx of cholecystectomy   . Hx of appendectomy   . CAD (coronary artery disease)     has documented coronary vasospasm but  with underlying 40% prox LAD, 30% prox LCX and 70 to 80% proximal 1st DX and 30% RCA; recurrent vasospasm with subsequent cath in Jan 2013.   Marland Kitchen Palpitations     tachycardia on holter; treated with beta blockers  . Coronary vasospasm   . Esophagitis   . IBS (irritable bowel syndrome)     Past Surgical History  Procedure Date  . Cholecystectomy 1982  . Appendectomy 1976  . Breast surgery 2002    biopsy  . Cardiac catheterization 2011  . Upper gastrointestinal endoscopy 01/11/2011    esophagitis  . Cardiac catheterization Sept 2012    With documented vasospasm but with residual disease; managed medically    History   Social History  . Marital Status: Married    Spouse Name: N/A    Number of Children: N/A  . Years of Education: N/A   Occupational History  . Not on file.   Social History Main Topics  . Smoking status: Former Smoker -- 0.2 packs/day for 30 years    Types: Cigarettes    Quit date: 05/12/2011  . Smokeless tobacco: Never Used  . Alcohol Use: No     prev 4-5 per week.  Currently not drinking (08/2011)  . Drug Use: No  . Sexually Active: Yes  Other Topics Concern  . Not on file   Social History Narrative   Caffeine drinks 2 daily     ROS: no fevers or chills, productive cough, hemoptysis, dysphasia, odynophagia, melena, hematochezia, dysuria, hematuria, rash, seizure activity, orthopnea, PND, pedal edema, claudication. Remaining systems are negative.  Physical Exam: Well-developed well-nourished in no acute distress.  Skin is warm and dry.  HEENT is normal.  Neck is supple. No thyromegaly.  Chest is clear to auscultation with normal expansion.  Cardiovascular exam is regular rate and rhythm.  Abdominal exam nontender or distended. No masses palpated. Extremities show no edema. neuro grossly intact  ECG sinus rhythm at a rate of 87. First degree AV block. Cannot rule out prior septal infarct.

## 2011-11-08 NOTE — Telephone Encounter (Signed)
New msg Pt said she is having chest pain/back pain

## 2011-11-08 NOTE — Telephone Encounter (Signed)
I spoke with the patient. She states she was started on Ranexa on Friday and today she had left sided CP that radiates under her left arm. She also reports dull back pain, SOB, and numbness to her finger tips. She is quite concerned over her symptoms. I explained to her I would review with Dr. Patty Sermons and call her back. Sherri Rad, RN, BSN   Reviewed with Dr. Patty Sermons. He would like the patient to see Lawson Fiscal, NP this afternoon. Per Juliette Alcide, she will call the patient back. I will forward the message to [place]. Sherri Rad, RN, BSN

## 2011-11-08 NOTE — Assessment & Plan Note (Signed)
Continue aspirin and statin. 

## 2011-11-18 ENCOUNTER — Ambulatory Visit (INDEPENDENT_AMBULATORY_CARE_PROVIDER_SITE_OTHER): Payer: Managed Care, Other (non HMO) | Admitting: Cardiology

## 2011-11-18 ENCOUNTER — Encounter: Payer: Self-pay | Admitting: Cardiology

## 2011-11-18 ENCOUNTER — Encounter: Payer: Self-pay | Admitting: *Deleted

## 2011-11-18 DIAGNOSIS — R079 Chest pain, unspecified: Secondary | ICD-10-CM

## 2011-11-18 DIAGNOSIS — I201 Angina pectoris with documented spasm: Secondary | ICD-10-CM

## 2011-11-18 DIAGNOSIS — J40 Bronchitis, not specified as acute or chronic: Secondary | ICD-10-CM

## 2011-11-18 DIAGNOSIS — E7800 Pure hypercholesterolemia, unspecified: Secondary | ICD-10-CM

## 2011-11-18 NOTE — Assessment & Plan Note (Addendum)
The patient has had a good response to the combination of aspirin, diltiazem, Isordil, and Ranexa.  She has been able to cut way back on her use of sublingual nitroglycerin.  He is starting to embark on a regular exercise program in an effort to lose weight.  Since last visit she has actually gained 4 pounds but hopes to lose the weight soon.

## 2011-11-18 NOTE — Patient Instructions (Signed)
Your physician recommends that you continue on your current medications as directed. Please refer to the Current Medication list given to you today. Your physician recommends that you schedule a follow-up appointment in: 3 months for office visit and EKG  

## 2011-11-18 NOTE — Progress Notes (Signed)
[name] Date of Birth:  Dec 19, 1960 [facility] [address] [place], [place]  [address] [phone]  Fax   [phone]  HPI: This pleasant 51 year old woman is seen for a scheduled followup office visit.  She has a history of Prinzmetal angina.  Since last visit she has been feeling better.  She has felt a beneficial response since we added Ranexa to her regimen.  He is having only occasional chest pain now and has to take an occasional sublingual nitroglycerin.  She has known nonobstructive coronary artery disease by catheterization.  She has previous tobacco abuse and she has a history of hyperlipidemia.  Current Outpatient Prescriptions  Medication Sig Dispense Refill  . aspirin 81 MG tablet Take 81 mg by mouth daily.        Marland Kitchen atorvastatin (LIPITOR) 20 MG tablet Take 1 tablet (20 mg total) by mouth daily.  30 tablet  11  . diltiazem (CARDIZEM CD) 360 MG 24 hr capsule Take 1 capsule (360 mg total) by mouth daily.  30 capsule  6  . isosorbide dinitrate (ISORDIL) 30 MG tablet Take 1 tablet (30 mg total) by mouth 3 (three) times daily.  90 tablet  6  . naproxen sodium (ALEVE) 220 MG tablet Take 220 mg by mouth as needed.       . nitroGLYCERIN (NITROSTAT) 0.4 MG SL tablet Place 0.4 mg under the tongue every 5 (five) minutes as needed.        . pantoprazole (PROTONIX) 40 MG tablet TAKE ONE TABLET BY MOUTH EVERY DAY  30 tablet  8  . ranolazine (RANEXA) 500 MG 12 hr tablet Take 1 tablet (500 mg total) by mouth 2 (two) times daily.  28 tablet  1    No Known Allergies  Patient Active Problem List  Diagnoses  . Abdominal pain, epigastric  . Diarrhea  . CAD (coronary artery disease)  . Tobacco abuse  . Chest pain  . Tachycardia  . History of esophagitis  . Coronary vasospasm  . IBS (irritable bowel syndrome)  . Nonspecific elevation of levels of transaminase or lactic acid dehydrogenase (LDH)  . Constipation, acute  . Bronchitis    History  Smoking  status  . Former Smoker -- 0.2 packs/day for 30 years  . Types: Cigarettes  . Quit date: 05/12/2011  Smokeless tobacco  . Never Used    History  Alcohol Use No    prev 4-5 per week.  Currently not drinking (08/2011)    Family History  Problem Relation Age of Onset  . Breast cancer Mother 24  . Heart disease Mother     died CHF 7  . Diabetes Mother   . Prostate cancer Father   . Hyperlipidemia Father   . Heart disease Father     s/p cabg - alive  . Colon cancer      First cousin on dads side     Review of Systems: The patient denies any heat or cold intolerance.  No weight gain or weight loss.  The patient denies headaches or blurry vision.  There is no cough or sputum production.  The patient denies dizziness.  There is no hematuria or hematochezia.  The patient denies any muscle aches or arthritis.  The patient denies any rash.  The patient denies frequent falling or instability.  There is no history of depression or anxiety.  All other systems were reviewed and are negative.   Physical Exam: Filed Vitals:   11/18/11 1547  BP:  118/70  Pulse: 80   the general appearance reveals a well-developed well-nourished woman in no distress.The head and neck exam reveals pupils equal and reactive.  Extraocular movements are full.  There is no scleral icterus.  The mouth and pharynx are normal.  The neck is supple.  The carotids reveal no bruits.  The jugular venous pressure is normal.  The  thyroid is not enlarged.  There is no lymphadenopathy.  The chest is clear to percussion and auscultation.  There are no rales or rhonchi.  Expansion of the chest is symmetrical.  The precordium is quiet.  The first heart sound is normal.  The second heart sound is physiologically split.  There is no murmur gallop rub or click.  There is no abnormal lift or heave.  The abdomen is soft and nontender.  The bowel sounds are normal.  The liver and spleen are not enlarged.  There are no abdominal masses.   There are no abdominal bruits.  Extremities reveal good pedal pulses.  There is no phlebitis or edema.  There is no cyanosis or clubbing.  Strength is normal and symmetrical in all extremities.  There is no lateralizing weakness.  There are no sensory deficits.  The skin is warm and dry.  There is no rash.      Assessment / Plan: Continue same medication.  Recheck in 3 months for followup office visit and EKG.  Continue low-cholesterol diet and increase exercise as tolerated.

## 2011-11-18 NOTE — Assessment & Plan Note (Signed)
The patient had a recent bronchitis which responded to Zithromax.  She still has a dry nonproductive cough and she will continue to take plain Mucinex.  She is not on an ACE inhibitor

## 2011-11-29 ENCOUNTER — Other Ambulatory Visit: Payer: Self-pay | Admitting: Cardiology

## 2011-11-30 ENCOUNTER — Telehealth: Payer: Self-pay | Admitting: Cardiology

## 2011-11-30 MED ORDER — RANOLAZINE ER 500 MG PO TB12: 500.0000 mg | ORAL_TABLET | Freq: Two times a day (BID) | ORAL | Status: DC

## 2011-11-30 NOTE — Telephone Encounter (Signed)
Pt going out of town and only has three pills left needs refill of renexa called in asap, she didn't know that she had no refills left, uses [facility], pls call when done [phone]

## 2012-02-04 ENCOUNTER — Ambulatory Visit: Payer: Managed Care, Other (non HMO) | Admitting: Cardiology

## 2012-02-07 ENCOUNTER — Ambulatory Visit: Payer: Managed Care, Other (non HMO) | Admitting: Cardiology

## 2012-02-11 ENCOUNTER — Encounter: Payer: Self-pay | Admitting: Cardiology

## 2012-02-11 ENCOUNTER — Ambulatory Visit (INDEPENDENT_AMBULATORY_CARE_PROVIDER_SITE_OTHER): Payer: Managed Care, Other (non HMO) | Admitting: Cardiology

## 2012-02-11 VITALS — BP 116/78 | Ht 64.0 in | Wt 174.0 lb

## 2012-02-11 DIAGNOSIS — R000 Tachycardia, unspecified: Secondary | ICD-10-CM

## 2012-02-11 DIAGNOSIS — E7800 Pure hypercholesterolemia, unspecified: Secondary | ICD-10-CM

## 2012-02-11 DIAGNOSIS — I2510 Atherosclerotic heart disease of native coronary artery without angina pectoris: Secondary | ICD-10-CM

## 2012-02-11 NOTE — Progress Notes (Signed)
[name] Date of Birth:  Dec 26, 1960 [facility] [address] [place], [place]  [address] [phone]  Fax   [phone]  HPI: This pleasant 51 year old woman is seen for a scheduled followup office visit.  She has a history of Prinzmetal angina.  She has been feeling better since we added Ranexa to her regimen.  He has not been having a recent severe chest pain.  She has been concerned about weight gain.  She is trying to cut back on calories and also avoid added salt.  He had noted some mild ankle edema and some increase in the spider veins in her legs the patient has known nonobstructive coronary artery disease by cardiac catheterization.  Her chest discomfort is responsive to sublingual nitroglycerin which she has to use very infrequently now. Current Outpatient Prescriptions  Medication Sig Dispense Refill  . aspirin 81 MG tablet Take 81 mg by mouth daily.        Marland Kitchen atorvastatin (LIPITOR) 20 MG tablet Take 1 tablet (20 mg total) by mouth daily.  30 tablet  11  . diltiazem (CARDIZEM CD) 360 MG 24 hr capsule Take 1 capsule (360 mg total) by mouth daily.  30 capsule  6  . isosorbide dinitrate (ISORDIL) 30 MG tablet Take 1 tablet (30 mg total) by mouth 3 (three) times daily.  90 tablet  6  . naproxen sodium (ALEVE) 220 MG tablet Take 220 mg by mouth as needed.       . nitroGLYCERIN (NITROSTAT) 0.4 MG SL tablet Place 0.4 mg under the tongue every 5 (five) minutes as needed.        . pantoprazole (PROTONIX) 40 MG tablet TAKE ONE TABLET BY MOUTH EVERY DAY  30 tablet  8  . ranolazine (RANEXA) 500 MG 12 hr tablet Take 1 tablet (500 mg total) by mouth 2 (two) times daily.  60 tablet  7    No Known Allergies  Patient Active Problem List  Diagnosis  . Abdominal pain, epigastric  . Diarrhea  . CAD (coronary artery disease)  . Tobacco abuse  . Chest pain  . Tachycardia  . History of esophagitis  . Coronary vasospasm  . IBS (irritable bowel syndrome)  .  Nonspecific elevation of levels of transaminase or lactic acid dehydrogenase (LDH)  . Constipation, acute  . Bronchitis    History  Smoking status  . Former Smoker -- 0.2 packs/day for 30 years  . Types: Cigarettes  . Quit date: 05/12/2011  Smokeless tobacco  . Never Used    History  Alcohol Use No    prev 4-5 per week.  Currently not drinking (08/2011)    Family History  Problem Relation Age of Onset  . Breast cancer Mother 71  . Heart disease Mother     died CHF 4  . Diabetes Mother   . Prostate cancer Father   . Hyperlipidemia Father   . Heart disease Father     s/p cabg - alive  . Colon cancer      First cousin on dads side     Review of Systems: The patient denies any heat or cold intolerance.  No weight gain or weight loss.  The patient denies headaches or blurry vision.  There is no cough or sputum production.  The patient denies dizziness.  There is no hematuria or hematochezia.  The patient denies any muscle aches or arthritis.  The patient denies any rash.  The patient denies frequent falling or instability.  There  is no history of depression or anxiety.  All other systems were reviewed and are negative.   Physical Exam: Filed Vitals:   02/11/12 1618  BP: 116/78   the general appearance reveals a well-developed well-nourished woman in no distress.The head and neck exam reveals pupils equal and reactive.  Extraocular movements are full.  There is no scleral icterus.  The mouth and pharynx are normal.  The neck is supple.  The carotids reveal no bruits.  The jugular venous pressure is normal.  The  thyroid is not enlarged.  There is no lymphadenopathy.  The chest is clear to percussion and auscultation.  There are no rales or rhonchi.  Expansion of the chest is symmetrical.  The precordium is quiet.  The first heart sound is normal.  The second heart sound is physiologically split.  There is no murmur gallop rub or click.  There is no abnormal lift or heave.  The  abdomen is soft and nontender.  The bowel sounds are normal.  The liver and spleen are not enlarged.  There are no abdominal masses.  There are no abdominal bruits.  Extremities reveal good pedal pulses.  There is no phlebitis but there is mild ankle edema. There is no cyanosis or clubbing.  Strength is normal and symmetrical in all extremities.  There is no lateralizing weakness.  There are no sensory deficits.  The skin is warm and dry.  There is no rash.      Assessment / Plan: Continue same medication.  Recheck in 4 months for followup office visit and check fasting lipids than.  She is on statin therapy for her hypercholesterolemia

## 2012-02-11 NOTE — Assessment & Plan Note (Signed)
The patient has not been aware of any recent tachycardia or palpitations

## 2012-02-11 NOTE — Patient Instructions (Addendum)
Your physician wants you to follow-up in: 4 months with fasting labs You will receive a reminder letter in the mail two months in advance. If you don't receive a letter, please call our office to schedule the follow-up appointment.  Your physician recommends that you continue on your current medications as directed. Please refer to the Current Medication list given to you today.

## 2012-02-11 NOTE — Assessment & Plan Note (Signed)
The patient has not been expressing any recent severe chest pain.  She has been pleased with her current medical regimen.  The right axilla is expensive for her in terms of its co-pay but we were able to find her some samples

## 2012-03-22 ENCOUNTER — Telehealth: Payer: Self-pay | Admitting: Cardiology

## 2012-03-22 ENCOUNTER — Telehealth: Payer: Self-pay | Admitting: Family Medicine

## 2012-03-22 NOTE — Telephone Encounter (Signed)
Caller: Caroline [name]/Patient; PCP: Evelena Peat; CB#: [phone]; ; ; Call regarding Swelling to both ankles getting worse.  Onset :approx. one month ago and has gotten worse and becoming painful to walk. Some SOB noted today when shopping this pm- no SOB presently . All emergent sxs. ruled out per Edema, Atraumatic with exception " new onset or worsening SOB or diff. breathing". Advised caller should be seen in ED for sxs. described and heart disease hx.  Caller declined and states she will call her Cariologist to advise further.

## 2012-03-22 NOTE — Telephone Encounter (Signed)
Swelling in ankles and shortness of breath. Has been using NTG recently. Scheduled appointment for tomorrow to see  Dr. Patty Sermons

## 2012-03-22 NOTE — Telephone Encounter (Signed)
Please return call to patient at [phone] regarding swelling

## 2012-03-23 ENCOUNTER — Encounter: Payer: Self-pay | Admitting: Cardiology

## 2012-03-23 ENCOUNTER — Ambulatory Visit (INDEPENDENT_AMBULATORY_CARE_PROVIDER_SITE_OTHER): Payer: Managed Care, Other (non HMO) | Admitting: Cardiology

## 2012-03-23 VITALS — BP 128/80 | HR 77 | Ht 64.0 in | Wt 178.0 lb

## 2012-03-23 DIAGNOSIS — R609 Edema, unspecified: Secondary | ICD-10-CM

## 2012-03-23 DIAGNOSIS — R0789 Other chest pain: Secondary | ICD-10-CM

## 2012-03-23 MED ORDER — FUROSEMIDE 20 MG PO TABS: ORAL_TABLET | ORAL | Status: DC

## 2012-03-23 NOTE — Patient Instructions (Signed)
Add Lasix 20 mg every other day as needed, Rx sent to [facility]  Will schedule your October recall appointment

## 2012-03-23 NOTE — Progress Notes (Signed)
Caroline [name] Date of Birth:  03-19-1961 [facility] [address] [place], [place]  [address] [phone]  Fax   [phone]  HPI: This pleasant 51 year old woman is seen for a work in the office visit.  She has a history of Prinzmetal angina.  She has been doing better since we added Ranexa to her regimen.  Over the past several weeks she has had a little more shortness of breath.  She has noted some intermittent peripheral edema.  Yesterday her feet were very swollen in the hot weather and she had difficulty putting her shoes on.  Today her edema is improved.  However her weight is up 4 pounds since last visit.  She did have some dull left-sided chest discomfort with radiation behind the left scapula yesterday and did try a sublingual nitroglycerin which helped.  He also took a 500 mg Tylenol.  He has not been taking any recent nonsteroidal anti-inflammatories.  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  She sits at a desk but is up and about the office intermittently during the day.  She has not been experiencing any calf pain to suggest deep vein thrombosis.  Current Outpatient Prescriptions  Medication Sig Dispense Refill  . aspirin 81 MG tablet Take 81 mg by mouth daily.        Marland Kitchen atorvastatin (LIPITOR) 20 MG tablet Take 1 tablet (20 mg total) by mouth daily.  30 tablet  11  . diltiazem (CARDIZEM CD) 360 MG 24 hr capsule Take 1 capsule (360 mg total) by mouth daily.  30 capsule  6  . isosorbide dinitrate (ISORDIL) 30 MG tablet Take 1 tablet (30 mg total) by mouth 3 (three) times daily.  90 tablet  6  . naproxen sodium (ALEVE) 220 MG tablet Take 220 mg by mouth as needed.       . nitroGLYCERIN (NITROSTAT) 0.4 MG SL tablet Place 0.4 mg under the tongue every 5 (five) minutes as needed.        . pantoprazole (PROTONIX) 40 MG tablet TAKE ONE TABLET BY MOUTH EVERY DAY  30 tablet  8  . ranolazine (RANEXA) 500 MG 12 hr tablet Take 1  tablet (500 mg total) by mouth 2 (two) times daily.  60 tablet  7  . furosemide (LASIX) 20 MG tablet Every other day as needed  90 tablet  1    No Known Allergies  Patient Active Problem List  Diagnosis  . Abdominal pain, epigastric  . Diarrhea  . CAD (coronary artery disease)  . Tobacco abuse  . Chest pain  . Tachycardia  . History of esophagitis  . Coronary vasospasm  . IBS (irritable bowel syndrome)  . Nonspecific elevation of levels of transaminase or lactic acid dehydrogenase (LDH)  . Constipation, acute  . Bronchitis    History  Smoking status  . Former Smoker -- 0.2 packs/day for 30 years  . Types: Cigarettes  . Quit date: 05/12/2011  Smokeless tobacco  . Never Used    History  Alcohol Use No    prev 4-5 per week.  Currently not drinking (08/2011)    Family History  Problem Relation Age of Onset  . Breast cancer Mother 13  . Heart disease Mother     died CHF 71  . Diabetes Mother   . Prostate cancer Father   . Hyperlipidemia Father   . Heart disease Father     s/p cabg - alive  .  Colon cancer      First cousin on dads side     Review of Systems: The patient denies any heat or cold intolerance.  No weight gain or weight loss.  The patient denies headaches or blurry vision.  There is no cough or sputum production.  The patient denies dizziness.  There is no hematuria or hematochezia.  The patient denies any muscle aches or arthritis.  The patient denies any rash.  The patient denies frequent falling or instability.  There is no history of depression or anxiety.  All other systems were reviewed and are negative.   Physical Exam: Filed Vitals:   03/23/12 1436  BP: 128/80  Pulse: 77   the general appearance reveals a well-developed well-nourished woman in no distress.The head and neck exam reveals pupils equal and reactive.  Extraocular movements are full.  There is no scleral icterus.  The mouth and pharynx are normal.  The neck is supple.  The carotids  reveal no bruits.  The jugular venous pressure is normal.  The  thyroid is not enlarged.  There is no lymphadenopathy.  The chest is clear to percussion and auscultation.  There are no rales or rhonchi.  Expansion of the chest is symmetrical.  The precordium is quiet.  The first heart sound is normal.  The second heart sound is physiologically split.  There is no murmur gallop rub or click.  There is no abnormal lift or heave.  The abdomen is soft and nontender.  The bowel sounds are normal.  The liver and spleen are not enlarged.  There are no abdominal masses.  There are no abdominal bruits.  Extremities reveal good pedal pulses.  There is no phlebitis but there is trace ankle edema today. There is no cyanosis or clubbing.  Strength is normal and symmetrical in all extremities.  There is no lateralizing weakness.  There are no sensory deficits.  The skin is warm and dry.  There is no rash.  EKG shows normal sinus rhythm with first degree AV block and no ischemic ST-T wave changes   Assessment / Plan:  Impression is that the patient is having symptoms of dyspnea and fluid retention and edema.  Her Prinzmetal angina does not appear to be any worse.  We will give her a prescription for furosemide 20 mg which she will take for the next 2 days each day and after that just every other day when necessary for symptomatic fluid retention.  I expect that she will need to use it more often in the very hot weather.  I've encouraged her to continue to get regular aerobic exercise.  Recheck in October for her regularly scheduled visit.

## 2012-03-23 NOTE — Assessment & Plan Note (Signed)
Generally the patient has been having less angina pectoris since the addition of Ranexa.  Yesterday's chest discomfort may have been related to excessive fluid retention.  The very hot weather maybe also playing a role

## 2012-04-05 ENCOUNTER — Emergency Department (HOSPITAL_COMMUNITY): Payer: Managed Care, Other (non HMO)

## 2012-04-05 ENCOUNTER — Encounter (HOSPITAL_COMMUNITY): Payer: Self-pay

## 2012-04-05 ENCOUNTER — Emergency Department (HOSPITAL_COMMUNITY)
Admission: EM | Admit: 2012-04-05 | Discharge: 2012-04-05 | Disposition: A | Discharge status: Home / Self Care | Payer: Managed Care, Other (non HMO) | Attending: Emergency Medicine | Admitting: Emergency Medicine

## 2012-04-05 DIAGNOSIS — R079 Chest pain, unspecified: Secondary | ICD-10-CM

## 2012-04-05 DIAGNOSIS — Z7982 Long term (current) use of aspirin: Secondary | ICD-10-CM | POA: Insufficient documentation

## 2012-04-05 DIAGNOSIS — K219 Gastro-esophageal reflux disease without esophagitis: Secondary | ICD-10-CM | POA: Insufficient documentation

## 2012-04-05 DIAGNOSIS — E785 Hyperlipidemia, unspecified: Secondary | ICD-10-CM | POA: Insufficient documentation

## 2012-04-05 DIAGNOSIS — K589 Irritable bowel syndrome without diarrhea: Secondary | ICD-10-CM | POA: Insufficient documentation

## 2012-04-05 DIAGNOSIS — Z87891 Personal history of nicotine dependence: Secondary | ICD-10-CM | POA: Insufficient documentation

## 2012-04-05 DIAGNOSIS — I2510 Atherosclerotic heart disease of native coronary artery without angina pectoris: Secondary | ICD-10-CM | POA: Insufficient documentation

## 2012-04-05 DIAGNOSIS — Z9089 Acquired absence of other organs: Secondary | ICD-10-CM | POA: Insufficient documentation

## 2012-04-05 DIAGNOSIS — Z79899 Other long term (current) drug therapy: Secondary | ICD-10-CM | POA: Insufficient documentation

## 2012-04-05 LAB — CBC WITH DIFFERENTIAL/PLATELET
Basophils Absolute: 0 10*3/uL (ref 0.0–0.1)
Basophils Relative: 0 % (ref 0–1)
Eosinophils Absolute: 0.5 10*3/uL (ref 0.0–0.7)
Eosinophils Relative: 6 % — ABNORMAL HIGH (ref 0–5)
MCH: 33.6 pg (ref 26.0–34.0)
MCV: 95.8 fL (ref 78.0–100.0)
Neutrophils Relative %: 57 % (ref 43–77)
Platelets: 197 10*3/uL (ref 150–400)
RBC: 4.53 MIL/uL (ref 3.87–5.11)
RDW: 13.9 % (ref 11.5–15.5)

## 2012-04-05 LAB — COMPREHENSIVE METABOLIC PANEL
ALT: 22 U/L (ref 0–35)
AST: 23 U/L (ref 0–37)
Albumin: 4.2 g/dL (ref 3.5–5.2)
Alkaline Phosphatase: 90 U/L (ref 39–117)
Calcium: 9.5 mg/dL (ref 8.4–10.5)
GFR calc Af Amer: >90 mL/min (ref >90)
Potassium: 3.8 mEq/L (ref 3.5–5.1)
Sodium: 139 mEq/L (ref 135–145)
Total Protein: 8.1 g/dL (ref 6.0–8.3)

## 2012-04-05 MED ORDER — ASPIRIN 81 MG PO CHEW
324.0000 mg | CHEWABLE_TABLET | Freq: Once | ORAL | Status: AC
  Administered 2012-04-05: 324 mg via ORAL
  Filled 2012-04-05: qty 4

## 2012-04-05 MED ORDER — NITROGLYCERIN 2 % TD OINT
1.0000 [in_us] | TOPICAL_OINTMENT | Freq: Once | TRANSDERMAL | Status: AC
  Administered 2012-04-05: 1 [in_us] via TOPICAL
  Filled 2012-04-05: qty 1

## 2012-04-05 NOTE — ED Provider Notes (Signed)
See prior note   Ward Givens, MD 04/05/12 4098

## 2012-04-05 NOTE — ED Notes (Signed)
Pt reports sudden onset of (L)side chest pain radiating to (L) shoulder and down to back of (L) arm, pt reports taking nitro sl @ 1245 w/decrease in pain and 1330 w/no change, pt also reports lightheadedness and nausea

## 2012-04-05 NOTE — ED Provider Notes (Signed)
Patient reports she was diagnosed with Prinzmetal's angina earlier this year when she was admitted for NSTEMI and had spasms seen during her cath which showed no blockages, she relates today she started having chest pain off and on but was not exertional. She relates she's taken a total of 2 nitroglycerin sublingual which helped however the pain returned. She reports pain got worse about 12:30 this afternoon with nausea and feeling lightheaded. She relates her pain is more in the left upper chest and left shoulder region rather than a central area of her chest and is in the same location as to when she had the pain before.  Medical screening examination/treatment/procedure(s) were conducted as a shared visit with non-physician practitioner(s) and myself.  I personally evaluated the patient during the encounter  Devoria Albe, MD, Armando GangWard Givens, MD 04/05/12 [phone]

## 2012-04-05 NOTE — ED Notes (Signed)
Patient in radiology at present.  Family at bedside.

## 2012-04-05 NOTE — Consult Note (Signed)
Reason for Consult:Chest pain Referring Physician: Dr. Lynelle Doctor, ER  Caroline [name] is an 51 y.o. female.  HPI: This patient has known Prinzmetal angina and comes in today with chest pain. Pain is similar to her prior episodes.  She is on calcium channel blockers, nitrates, and Ranexa.  In the ER her cardiac enzymes are normal and her EKG is normal and shows no ischemia. Chest xray also normal except for bronchitic changes which are chronic.  Past Medical History  Diagnosis Date  . GERD (gastroesophageal reflux disease)   . Hyperlipidemia   . Hx of cholecystectomy   . Hx of appendectomy   . CAD (coronary artery disease)     has documented coronary vasospasm but with underlying 40% prox LAD, 30% prox LCX and 70 to 80% proximal 1st DX and 30% RCA; recurrent vasospasm with subsequent cath in Jan 2013.   Marland Kitchen Palpitations     tachycardia on holter; treated with beta blockers  . Coronary vasospasm   . Esophagitis   . IBS (irritable bowel syndrome)     Past Surgical History  Procedure Date  . Cholecystectomy 1982  . Appendectomy 1976  . Breast surgery 2002    biopsy  . Cardiac catheterization 2011  . Upper gastrointestinal endoscopy 01/11/2011    esophagitis  . Cardiac catheterization Sept 2012    With documented vasospasm but with residual disease; managed medically    Family History  Problem Relation Age of Onset  . Breast cancer Mother 95  . Heart disease Mother     died CHF 87  . Diabetes Mother   . Prostate cancer Father   . Hyperlipidemia Father   . Heart disease Father     s/p cabg - alive  . Colon cancer      First cousin on dads side     Social History:  reports that she quit smoking about 10 months ago. Her smoking use included Cigarettes. She has a 7.5 pack-year smoking history. She has never used smokeless tobacco. She reports that she does not drink alcohol or use illicit drugs.  Allergies: No Known Allergies  Medications:  Outpatient meds have been reviewed  .  aspirin  324 mg Oral Once  . nitroGLYCERIN  1 inch Topical Once    Results for orders placed during the hospital encounter of 04/05/12 (from the past 48 hour(s))  CBC WITH DIFFERENTIAL     Status: Abnormal   Collection Time   04/05/12  2:49 PM      Component Value Range Comment   WBC 8.4  4.0 - 10.5 K/uL    RBC 4.53  3.87 - 5.11 MIL/uL    Hemoglobin 15.2 (*) 12.0 - 15.0 g/dL    HCT 86.5  78.4 - 69.6 %    MCV 95.8  78.0 - 100.0 fL    MCH 33.6  26.0 - 34.0 pg    MCHC 35.0  30.0 - 36.0 g/dL    RDW 29.5  28.4 - 13.2 %    Platelets 197  150 - 400 K/uL    Neutrophils Relative 57  43 - 77 %    Neutro Abs 4.8  1.7 - 7.7 K/uL    Lymphocytes Relative 29  12 - 46 %    Lymphs Abs 2.5  0.7 - 4.0 K/uL    Monocytes Relative 8  3 - 12 %    Monocytes Absolute 0.7  0.1 - 1.0 K/uL    Eosinophils Relative 6 (*) 0 - 5 %  Eosinophils Absolute 0.5  0.0 - 0.7 K/uL    Basophils Relative 0  0 - 1 %    Basophils Absolute 0.0  0.0 - 0.1 K/uL   COMPREHENSIVE METABOLIC PANEL     Status: Abnormal   Collection Time   04/05/12  2:49 PM      Component Value Range Comment   Sodium 139  135 - 145 mEq/L    Potassium 3.8  3.5 - 5.1 mEq/L    Chloride 100  96 - 112 mEq/L    CO2 26  19 - 32 mEq/L    Glucose, Bld 116 (*) 70 - 99 mg/dL    BUN 14  6 - 23 mg/dL    Creatinine, Ser 1.61  0.50 - 1.10 mg/dL    Calcium 9.5  8.4 - 09.6 mg/dL    Total Protein 8.1  6.0 - 8.3 g/dL    Albumin 4.2  3.5 - 5.2 g/dL    AST 23  0 - 37 U/L    ALT 22  0 - 35 U/L    Alkaline Phosphatase 90  39 - 117 U/L    Total Bilirubin 0.4  0.3 - 1.2 mg/dL    GFR calc non Af Amer >90  >90 mL/min    GFR calc Af Amer >90  >90 mL/min   POCT I-STAT TROPONIN I     Status: Normal   Collection Time   04/05/12  3:48 PM      Component Value Range Comment   Troponin i, poc 0.00  0.00 - 0.08 ng/mL    Comment 3              Dg Chest 2 View  04/05/2012  *RADIOLOGY REPORT*  Clinical Data: Chest pain  CHEST - 2 VIEW  Comparison: 06/01/2011  Findings: Mild  chronic central bronchitic changes.  No focal pneumonia, edema, collapse, consolidation, effusion or pneumothorax.  Trachea midline.  Normal heart size and vascularity. No significant interval change.  IMPRESSION: Stable mild bronchitic changes.  No superimposed acute process  Original Report Authenticated By: Judie Petit. Ruel Favors, M.D.    ROS: She has been under more stress recently regarding her aging father who lives 2 hours away. Blood pressure 119/70, pulse 70, temperature 97.8 F (36.6 C), temperature source Oral, resp. rate 16, SpO2 95.00%. The patient appears to be in no distress.  Head and neck exam reveals that the pupils are equal and reactive.  The extraocular movements are full.  There is no scleral icterus.  Mouth and pharynx are benign.  No lymphadenopathy.  No carotid bruits.  The jugular venous pressure is normal.  Thyroid is not enlarged or tender.  Chest is clear to percussion and auscultation.  No rales or rhonchi.  Expansion of the chest is symmetrical.  Heart reveals no abnormal lift or heave.  First and second heart sounds are normal.  There is no murmur gallop rub or click.  The abdomen is soft and nontender.  Bowel sounds are normoactive.  There is no hepatosplenomegaly or mass.  There are no abdominal bruits.  Extremities reveal no phlebitis or edema.  Pedal pulses are good.  There is no cyanosis or clubbing.  Neurologic exam is normal strength and no lateralizing weakness.  No sensory deficits.  Integument reveals no rash  EKG is normal.  Labs all okay.  Xray okay  Assessment/Plan: Chest pain secondary to coronary spasm, resolved.  Plan:  Ok to be discharged home from ER tonight.  We are  going to have her increase her Ranexa to 1000 mg BID and she will continue other meds as is. May also benefit from mild anxiolytic agent.  She will call my office tomorrow and speak to [facility] about med changes.  Cassell Clement 04/05/2012, 6:48 PM

## 2012-04-05 NOTE — ED Provider Notes (Signed)
History     CSN: [id number]  Arrival date & time 04/05/12  1436   First MD Initiated Contact with Patient 04/05/12 1535      Chief Complaint  Patient presents with  . Chest Pain    (Consider location/radiation/quality/duration/timing/severity/associated sxs/prior treatment) HPI Comments: Caroline [name] is a 51 y.o. Female, with history of Prinzmetal Angina, here with chest pain. Pt states pain today, about 3 episodes, each one associated with nausea, light headiness, shortness of breath. States pain mainly over left shoulder and scapula. States took nitro each time, with pain relief. Pt is currently CP free. Pain not associated with anything. Pain is described as pressure. Pt states "the feeling I had scares me."  No fever, chills, abdominal pain. Mild swelling in LE but has not changed in last few days. No cough.    Past Medical History  Diagnosis Date  . GERD (gastroesophageal reflux disease)   . Hyperlipidemia   . Hx of cholecystectomy   . Hx of appendectomy   . CAD (coronary artery disease)     has documented coronary vasospasm but with underlying 40% prox LAD, 30% prox LCX and 70 to 80% proximal 1st DX and 30% RCA; recurrent vasospasm with subsequent cath in Jan 2013.   Marland Kitchen Palpitations     tachycardia on holter; treated with beta blockers  . Coronary vasospasm   . Esophagitis   . IBS (irritable bowel syndrome)     Past Surgical History  Procedure Date  . Cholecystectomy 1982  . Appendectomy 1976  . Breast surgery 2002    biopsy  . Cardiac catheterization 2011  . Upper gastrointestinal endoscopy 01/11/2011    esophagitis  . Cardiac catheterization Sept 2012    With documented vasospasm but with residual disease; managed medically    Family History  Problem Relation Age of Onset  . Breast cancer Mother 54  . Heart disease Mother     died CHF 45  . Diabetes Mother   . Prostate cancer Father   . Hyperlipidemia Father   . Heart disease Father     s/p cabg - alive    . Colon cancer      First cousin on dads side     History  Substance Use Topics  . Smoking status: Former Smoker -- 0.2 packs/day for 30 years    Types: Cigarettes    Quit date: 05/12/2011  . Smokeless tobacco: Never Used  . Alcohol Use: No     prev 4-5 per week.  Currently not drinking (08/2011)    OB History    Grav Para Term Preterm Abortions TAB SAB Ect Mult Living                  Review of Systems  Constitutional: Negative for fever and chills.  HENT: Negative for neck pain and neck stiffness.   Respiratory: Positive for chest tightness and shortness of breath. Negative for cough.   Cardiovascular: Positive for chest pain and leg swelling. Negative for palpitations.  Gastrointestinal: Positive for nausea. Negative for vomiting and abdominal pain.  Genitourinary: Negative for dysuria, flank pain, vaginal bleeding and vaginal discharge.  Musculoskeletal: Negative for back pain.  Skin: Negative.   Neurological: Positive for dizziness and light-headedness. Negative for weakness and headaches.    Allergies  Review of patient's allergies indicates no known allergies.  Home Medications   Current Outpatient Rx  Name Route Sig Dispense Refill  . ACETAMINOPHEN 500 MG PO TABS Oral Take 500 mg by mouth  every 6 (six) hours as needed. For pain    . ASPIRIN 81 MG PO TABS Oral Take 81 mg by mouth daily.      . ATORVASTATIN CALCIUM 20 MG PO TABS Oral Take 1 tablet (20 mg total) by mouth daily. 30 tablet 11  . DILTIAZEM HCL ER COATED BEADS 360 MG PO CP24 Oral Take 1 capsule (360 mg total) by mouth daily. 30 capsule 6  . FUROSEMIDE 20 MG PO TABS Oral Take 20 mg by mouth every other day.    . ISOSORBIDE DINITRATE 30 MG PO TABS Oral Take 1 tablet (30 mg total) by mouth 3 (three) times daily. 90 tablet 6  . NITROGLYCERIN 0.4 MG SL SUBL Sublingual Place 0.4 mg under the tongue every 5 (five) minutes as needed. For chest pain    . PANTOPRAZOLE SODIUM 40 MG PO TBEC  TAKE ONE TABLET BY  MOUTH EVERY DAY 30 tablet 8  . RANOLAZINE ER 500 MG PO TB12 Oral Take 1 tablet (500 mg total) by mouth 2 (two) times daily. 60 tablet 7  . TETRAHYDROZOLINE HCL 0.05 % OP SOLN Both Eyes Place 1 drop into both eyes 4 (four) times daily.      BP 153/79  Pulse 88  Temp 97.8 F (36.6 C) (Oral)  Resp 18  SpO2 97%  Physical Exam  Nursing note and vitals reviewed. Constitutional: She is oriented to person, place, and time. She appears well-developed and well-nourished. No distress.  HENT:  Head: Normocephalic.  Eyes: Conjunctivae are normal.  Neck: Neck supple.  Cardiovascular: Normal rate, regular rhythm and normal heart sounds.   Pulmonary/Chest: Effort normal and breath sounds normal. No respiratory distress. She has no wheezes.  Abdominal: Soft. Bowel sounds are normal. She exhibits no distension. There is no tenderness. There is no rebound.  Musculoskeletal:       Trace LE edema bilaterally   Neurological: She is alert and oriented to person, place, and time.  Skin: Skin is warm and dry.  Psychiatric: She has a normal mood and affect.    ED Course  Procedures (including critical care time)   Date: 04/05/2012  Rate: 89  Rhythm: normal sinus rhythm  QRS Axis: normal  Intervals: normal  ST/T Wave abnormalities: normal  Conduction Disutrbances:none  Narrative Interpretation: septal q waves  Old EKG Reviewed: unchanged compared to 09/17/11  Pt currently cp free. Labs and asa and nitro ordered. ECG unremarkable.   Results for orders placed during the hospital encounter of 04/05/12  CBC WITH DIFFERENTIAL      Component Value Range   WBC 8.4  4.0 - 10.5 K/uL   RBC 4.53  3.87 - 5.11 MIL/uL   Hemoglobin 15.2 (*) 12.0 - 15.0 g/dL   HCT 84.6  96.2 - 95.2 %   MCV 95.8  78.0 - 100.0 fL   MCH 33.6  26.0 - 34.0 pg   MCHC 35.0  30.0 - 36.0 g/dL   RDW 84.1  32.4 - 40.1 %   Platelets 197  150 - 400 K/uL   Neutrophils Relative 57  43 - 77 %   Neutro Abs 4.8  1.7 - 7.7 K/uL    Lymphocytes Relative 29  12 - 46 %   Lymphs Abs 2.5  0.7 - 4.0 K/uL   Monocytes Relative 8  3 - 12 %   Monocytes Absolute 0.7  0.1 - 1.0 K/uL   Eosinophils Relative 6 (*) 0 - 5 %   Eosinophils Absolute 0.5  0.0 -  0.7 K/uL   Basophils Relative 0  0 - 1 %   Basophils Absolute 0.0  0.0 - 0.1 K/uL  COMPREHENSIVE METABOLIC PANEL      Component Value Range   Sodium 139  135 - 145 mEq/L   Potassium 3.8  3.5 - 5.1 mEq/L   Chloride 100  96 - 112 mEq/L   CO2 26  19 - 32 mEq/L   Glucose, Bld 116 (*) 70 - 99 mg/dL   BUN 14  6 - 23 mg/dL   Creatinine, Ser 1.61  0.50 - 1.10 mg/dL   Calcium 9.5  8.4 - 09.6 mg/dL   Total Protein 8.1  6.0 - 8.3 g/dL   Albumin 4.2  3.5 - 5.2 g/dL   AST 23  0 - 37 U/L   ALT 22  0 - 35 U/L   Alkaline Phosphatase 90  39 - 117 U/L   Total Bilirubin 0.4  0.3 - 1.2 mg/dL   GFR calc non Af Amer >90  >90 mL/min   GFR calc Af Amer >90  >90 mL/min  POCT I-STAT TROPONIN I      Component Value Range   Troponin i, poc 0.00  0.00 - 0.08 ng/mL   Comment 3            5:21 PM Troponin negative. Pt continues to have no symptoms. I spoke with Dr. Tedra Senegal, will come see pt.   7:08 PM Pt seen and evaluated by cardiology. Pt OK to be discharged home. Pt's Ranexa was doubled by them, close follow up given. Will d/c out of the system.      1. Chest pain       MDM          Lottie Mussel, PA 04/05/12 1909

## 2012-04-06 ENCOUNTER — Other Ambulatory Visit: Payer: Self-pay | Admitting: Cardiology

## 2012-04-06 DIAGNOSIS — F419 Anxiety disorder, unspecified: Secondary | ICD-10-CM

## 2012-04-06 DIAGNOSIS — R079 Chest pain, unspecified: Secondary | ICD-10-CM

## 2012-04-06 MED ORDER — ALPRAZOLAM 0.25 MG PO TABS: 0.2500 mg | ORAL_TABLET | Freq: Every day | ORAL | Status: DC | PRN

## 2012-04-06 MED ORDER — NITROGLYCERIN 0.4 MG SL SUBL: 0.4000 mg | SUBLINGUAL_TABLET | SUBLINGUAL | Status: DC | PRN

## 2012-04-06 MED ORDER — RANOLAZINE ER 1000 MG PO TB12: 1000.0000 mg | ORAL_TABLET | Freq: Two times a day (BID) | ORAL | Status: DC

## 2012-04-06 NOTE — Telephone Encounter (Signed)
Please return call to patient at [phone] to discuss medication changes

## 2012-04-06 NOTE — Telephone Encounter (Signed)
Discussed with  Dr. Patty Sermons, patient in hospital and changing some medications.  Per  Dr. Patty Sermons increase Ranexa to 1000 mg twice a day, add NTG 0.04 mg SL as needed for chest pains, and Xanax 0.25 mg daily as needed.  Advised patient and called to [facility].

## 2012-04-11 ENCOUNTER — Other Ambulatory Visit (HOSPITAL_COMMUNITY): Payer: Self-pay | Admitting: Physician Assistant

## 2012-04-21 ENCOUNTER — Other Ambulatory Visit (HOSPITAL_COMMUNITY): Payer: Self-pay | Admitting: Physician Assistant

## 2012-05-18 ENCOUNTER — Ambulatory Visit (INDEPENDENT_AMBULATORY_CARE_PROVIDER_SITE_OTHER): Payer: Managed Care, Other (non HMO) | Admitting: Family Medicine

## 2012-05-18 ENCOUNTER — Telehealth: Payer: Self-pay | Admitting: Cardiology

## 2012-05-18 ENCOUNTER — Encounter: Payer: Self-pay | Admitting: Family Medicine

## 2012-05-18 VITALS — BP 118/72 | Temp 98.0°F | Wt 175.0 lb

## 2012-05-18 DIAGNOSIS — J209 Acute bronchitis, unspecified: Secondary | ICD-10-CM

## 2012-05-18 MED ORDER — AZITHROMYCIN 250 MG PO TABS: ORAL_TABLET | ORAL | Status: DC

## 2012-05-18 NOTE — Patient Instructions (Addendum)

## 2012-05-18 NOTE — Telephone Encounter (Signed)
New problem:  Can a x-pack be called in .  Haven't seen PCP over a year.   [facility]  on [address].

## 2012-05-18 NOTE — Telephone Encounter (Signed)
Spoke with patient and she requested Zpak be called in.  Did explain these issues should go to PCP and should contact them ( Dr. Patty Sermons out of the office). Stated she would call them and if they couldn't see her she would go to urgent care

## 2012-05-18 NOTE — Progress Notes (Signed)
  Subjective:    Patient ID: Caroline [name], female    DOB: [birth year], 51 y.o.   MRN: [id number]  HPI  Acute illness. Patient presents with URI symptoms of cough head congestion malaise low-grade fever. Cough increasingly productive of green sputum past couple days. Some of similar symptoms. Patient quit smoking September 2012. Denies any headache, nausea, or vomiting.  Patient has history of coronary vasospasm. No recent chest pains. Medications reviewed and compliant with all. Last lipids were done last January.  Past Medical History  Diagnosis Date  . GERD (gastroesophageal reflux disease)   . Hyperlipidemia   . Hx of cholecystectomy   . Hx of appendectomy   . CAD (coronary artery disease)     has documented coronary vasospasm but with underlying 40% prox LAD, 30% prox LCX and 70 to 80% proximal 1st DX and 30% RCA; recurrent vasospasm with subsequent cath in Jan 2013.   Marland Kitchen Palpitations     tachycardia on holter; treated with beta blockers  . Coronary vasospasm   . Esophagitis   . IBS (irritable bowel syndrome)    Past Surgical History  Procedure Date  . Cholecystectomy 1982  . Appendectomy 1976  . Breast surgery 2002    biopsy  . Cardiac catheterization 2011  . Upper gastrointestinal endoscopy 01/11/2011    esophagitis  . Cardiac catheterization Sept 2012    With documented vasospasm but with residual disease; managed medically    reports that she quit smoking about a year ago. Her smoking use included Cigarettes. She has a 7.5 pack-year smoking history. She has never used smokeless tobacco. She reports that she does not drink alcohol or use illicit drugs. family history includes Breast cancer (age of onset:36) in her mother; Colon cancer in an unspecified family member; Diabetes in her mother; Heart disease in her father and mother; Hyperlipidemia in her father; and Prostate cancer in her father. No Known Allergies    Review of Systems  Constitutional: Positive for fever,  chills and fatigue.  HENT: Positive for congestion. Negative for sore throat.   Respiratory: Positive for cough.   Gastrointestinal: Negative for nausea, vomiting and diarrhea.  Genitourinary: Negative for dysuria.       Objective:   Physical Exam  Constitutional: She appears well-developed and well-nourished.  HENT:  Right Ear: External ear normal.  Left Ear: External ear normal.  Mouth/Throat: Oropharynx is clear and moist.  Neck: Neck supple. No thyromegaly present.  Cardiovascular: Normal rate and regular rhythm.   Pulmonary/Chest: Effort normal and breath sounds normal. No respiratory distress. She has no wheezes. She has no rales.  Lymphadenopathy:    She has no cervical adenopathy.          Assessment & Plan:  Acute bronchitis. ?viral though patient's had worsening symptoms past few days. Former smoker. Start Zithromax. Touch base if cough not improving next couple weeks and sooner as needed. Patient will schedule in January for followup and repeat labs

## 2012-05-29 ENCOUNTER — Ambulatory Visit (INDEPENDENT_AMBULATORY_CARE_PROVIDER_SITE_OTHER): Payer: Managed Care, Other (non HMO) | Admitting: Family Medicine

## 2012-05-29 ENCOUNTER — Encounter: Payer: Self-pay | Admitting: Family Medicine

## 2012-05-29 VITALS — BP 120/80 | Temp 98.0°F | Wt 176.0 lb

## 2012-05-29 DIAGNOSIS — R062 Wheezing: Secondary | ICD-10-CM

## 2012-05-29 MED ORDER — HYDROCOD POLST-CHLORPHEN POLST 10-8 MG/5ML PO LQCR: 5.0000 mL | Freq: Two times a day (BID) | ORAL | Status: DC | PRN

## 2012-05-29 MED ORDER — PREDNISONE 10 MG PO TABS: ORAL_TABLET | ORAL | Status: DC

## 2012-05-29 NOTE — Patient Instructions (Addendum)
ProAir 2 puffs every 4 hours as needed.   Touch base by next week if no better.

## 2012-05-29 NOTE — Progress Notes (Signed)
  Subjective:    Patient ID: Caroline [name], female    DOB: 1960/09/08, 51 y.o.   MRN: [id number]  HPI  Persistent coughing. Patient quit smoking last year. Treated with Zithromax on 05/18/2012. Her cough at this point mostly dry. Cough and increased wheezing-worse at night. No dyspnea. No hemoptysis. No pleuritic pain. She had some leftover Tussionex which helps her nighttime coughing. Denies any recent chest pains. No appetite or weight changes. She has no history of asthma.   Review of Systems  Constitutional: Negative for fever and chills.  HENT: Negative for congestion and sinus pressure.   Respiratory: Positive for cough and wheezing. Negative for shortness of breath.   Cardiovascular: Negative for chest pain.       Objective:   Physical Exam  Constitutional: She appears well-developed and well-nourished.  HENT:  Right Ear: External ear normal.  Left Ear: External ear normal.  Mouth/Throat: Oropharynx is clear and moist.  Neck: Neck supple. No thyromegaly present.  Cardiovascular: Normal rate and regular rhythm.   Pulmonary/Chest:       Patient has some diffuse expiratory wheezes. No rales. No retractions.  Musculoskeletal: She exhibits no edema.          Assessment & Plan:  Persistent cough. Reactive airway component. Pro-air inhaler 2 puffs every 4 hours when necessary for wheezing-sample given.. Prednisone taper starting at 40 mg daily. No indication for further antibiotics at this time. Refill Tussionex 1 teaspoon each bedtime when necessary cough 120 mL's with no refill. CXR if no better in 1 week.

## 2012-06-12 ENCOUNTER — Other Ambulatory Visit (INDEPENDENT_AMBULATORY_CARE_PROVIDER_SITE_OTHER): Payer: Managed Care, Other (non HMO)

## 2012-06-12 DIAGNOSIS — I2510 Atherosclerotic heart disease of native coronary artery without angina pectoris: Secondary | ICD-10-CM

## 2012-06-12 DIAGNOSIS — E7800 Pure hypercholesterolemia, unspecified: Secondary | ICD-10-CM

## 2012-06-12 LAB — BASIC METABOLIC PANEL
Calcium: 8.5 mg/dL (ref 8.4–10.5)
Chloride: 104 mEq/L (ref 96–112)
Creatinine, Ser: 0.7 mg/dL (ref 0.4–1.2)
Sodium: 139 mEq/L (ref 135–145)

## 2012-06-12 LAB — LIPID PANEL
Cholesterol: 155 mg/dL (ref 0–200)
HDL: 81.2 mg/dL (ref >39.00)
LDL Cholesterol: 62 mg/dL (ref 0–99)
Total CHOL/HDL Ratio: 2
Triglycerides: 61 mg/dL (ref 0.0–149.0)

## 2012-06-12 LAB — HEPATIC FUNCTION PANEL
Albumin: 3.7 g/dL (ref 3.5–5.2)
Alkaline Phosphatase: 71 U/L (ref 39–117)
Total Protein: 7.1 g/dL (ref 6.0–8.3)

## 2012-06-12 NOTE — Progress Notes (Signed)
Quick Note:  Please make copy of labs for patient visit. ______ 

## 2012-06-15 ENCOUNTER — Other Ambulatory Visit: Payer: Managed Care, Other (non HMO)

## 2012-06-19 ENCOUNTER — Ambulatory Visit (INDEPENDENT_AMBULATORY_CARE_PROVIDER_SITE_OTHER): Payer: Managed Care, Other (non HMO) | Admitting: Cardiology

## 2012-06-19 ENCOUNTER — Encounter: Payer: Self-pay | Admitting: *Deleted

## 2012-06-19 ENCOUNTER — Encounter: Payer: Self-pay | Admitting: Cardiology

## 2012-06-19 VITALS — BP 124/80 | HR 76 | Ht 64.0 in | Wt 178.0 lb

## 2012-06-19 DIAGNOSIS — I201 Angina pectoris with documented spasm: Secondary | ICD-10-CM

## 2012-06-19 DIAGNOSIS — I2510 Atherosclerotic heart disease of native coronary artery without angina pectoris: Secondary | ICD-10-CM

## 2012-06-19 DIAGNOSIS — I259 Chronic ischemic heart disease, unspecified: Secondary | ICD-10-CM

## 2012-06-19 NOTE — Progress Notes (Signed)
Caroline [name] Date of Birth:  1961-05-02 [facility] [address] [place], [place]  [address] [phone]  Fax   [phone]  HPI: This pleasant 51 year old woman is seen for a followup office visit. She has a history of Prinzmetal angina. 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 She sits at a desk but is up and about the office intermittently during the day. She has not been experiencing any calf pain to suggest deep vein thrombosis.  The previous edema that she experienced in the hot weather has resolved.  Since we last saw her she has had a upper respiratory infection treated by her primary care physician with Zithromax and subsequently with a course of prednisone.   Current Outpatient Prescriptions  Medication Sig Dispense Refill  . acetaminophen (TYLENOL) 500 MG tablet Take 500 mg by mouth every 6 (six) hours as needed. For pain      . ALPRAZolam (XANAX) 0.25 MG tablet Take 1 tablet (0.25 mg total) by mouth daily as needed.  30 tablet  3  . aspirin 81 MG tablet Take 81 mg by mouth daily.        Marland Kitchen atorvastatin (LIPITOR) 20 MG tablet Take 1 tablet (20 mg total) by mouth daily.  30 tablet  11  . chlorpheniramine-HYDROcodone (TUSSIONEX PENNKINETIC ER) 10-8 MG/5ML LQCR Take 5 mLs by mouth every 12 (twelve) hours as needed.  120 mL  0  . furosemide (LASIX) 20 MG tablet Take 1 tab if needed      . isosorbide dinitrate (ISORDIL) 30 MG tablet Take 1 tablet (30 mg total) by mouth 3 (three) times daily.  90 tablet  6  . nitroGLYCERIN (NITROSTAT) 0.4 MG SL tablet Place 1 tablet (0.4 mg total) under the tongue every 5 (five) minutes as needed. For chest pain  25 tablet  prn  . pantoprazole (PROTONIX) 40 MG tablet TAKE ONE TABLET BY MOUTH EVERY DAY  30 tablet  8  . ranolazine (RANEXA) 1000 MG SR tablet Take 1 tablet (1,000 mg total) by mouth 2 (two) times daily.  60 tablet  11    . TAZTIA XT 360 MG 24 hr capsule TAKE ONE CAPSULE BY MOUTH EVERY DAY  30 each  6  . DISCONTD: diltiazem (CARDIZEM CD) 360 MG 24 hr capsule Take 1 capsule (360 mg total) by mouth daily.  30 capsule  6  . DISCONTD: isosorbide dinitrate (ISORDIL) 30 MG tablet TAKE THREE TABLETS BY MOUTH EVERY DAY  90 tablet  5    No Known Allergies  Patient Active Problem List  Diagnosis  . Abdominal pain, epigastric  . Diarrhea  . CAD (coronary artery disease)  . Tobacco abuse  . Chest pain  . Tachycardia  . History of esophagitis  . Coronary vasospasm  . IBS (irritable bowel syndrome)  . Nonspecific elevation of levels of transaminase or lactic acid dehydrogenase (LDH)  . Constipation, acute  . Bronchitis    History  Smoking status  . Former Smoker -- 0.2 packs/day for 30 years  . Types: Cigarettes  . Quit date: 05/12/2011  Smokeless tobacco  . Never Used    History  Alcohol Use No    prev 4-5 per week.  Currently not drinking (08/2011)    Family History  Problem Relation Age of Onset  . Breast cancer Mother 34  . Heart disease Mother  died CHF 18  . Diabetes Mother   . Prostate cancer Father   . Hyperlipidemia Father   . Heart disease Father     s/p cabg - alive  . Colon cancer      First cousin on dads side     Review of Systems: The patient denies any heat or cold intolerance.  No weight gain or weight loss.  The patient denies headaches or blurry vision.  There is no cough or sputum production.  The patient denies dizziness.  There is no hematuria or hematochezia.  The patient denies any muscle aches or arthritis.  The patient denies any rash.  The patient denies frequent falling or instability.  There is no history of depression or anxiety.  All other systems were reviewed and are negative.   Physical Exam: Filed Vitals:   06/19/12 1615  BP: 124/80  Pulse: 76   the general appearance reveals a well-developed well-nourished woman in no distress.The head and neck exam  reveals pupils equal and reactive.  Extraocular movements are full.  There is no scleral icterus.  The mouth and pharynx are normal.  The neck is supple.  The carotids reveal no bruits.  The jugular venous pressure is normal.  The  thyroid is not enlarged.  There is no lymphadenopathy.  The chest is clear to percussion and auscultation.  There are no rales or rhonchi.  When she lies down there is mild expiratory wheezing as a residual of her recent bronchitis. Expansion of the chest is symmetrical.  The precordium is quiet.  The first heart sound is normal.  The second heart sound is physiologically split.  There is no murmur gallop rub or click.  There is no abnormal lift or heave.  The abdomen is soft and nontender.  The bowel sounds are normal.  The liver and spleen are not enlarged.  There are no abdominal masses.  There are no abdominal bruits.  Extremities reveal good pedal pulses.  There is no phlebitis or edema.  There is no cyanosis or clubbing.  Strength is normal and symmetrical in all extremities.  There is no lateralizing weakness.  There are no sensory deficits.  The skin is warm and dry.  There is no rash.      Assessment / Plan:  Continue same medication.  Recheck in 4 months for followup office visit and EKG

## 2012-06-19 NOTE — Assessment & Plan Note (Signed)
Patient has a history of coronary artery disease.  She has hypercholesterolemia and is on Lipitor 20 mg daily.  Her recent labs are excellent.

## 2012-06-19 NOTE — Assessment & Plan Note (Signed)
The patient has been doing well in regard to her Prinzmetal angina.  She rarely has any chest discomfort.  She rarely has to take any sublingual nitroglycerin.

## 2012-06-19 NOTE — Patient Instructions (Addendum)
Your physician recommends that you continue on your current medications as directed. Please refer to the Current Medication list given to you today.  Your physician recommends that you schedule a follow-up appointment in: 4 month ov/ekg 

## 2012-06-23 ENCOUNTER — Telehealth: Payer: Self-pay | Admitting: Cardiology

## 2012-06-23 NOTE — Telephone Encounter (Signed)
Pt needs to know if ok to use flonase nasal spray with the meds she is on? Ok to leave message

## 2012-06-25 NOTE — Telephone Encounter (Signed)
Okay to use flonase

## 2012-06-27 NOTE — Telephone Encounter (Signed)
Advised ok to use 

## 2012-06-28 ENCOUNTER — Ambulatory Visit (INDEPENDENT_AMBULATORY_CARE_PROVIDER_SITE_OTHER): Payer: Managed Care, Other (non HMO) | Admitting: Family Medicine

## 2012-06-28 ENCOUNTER — Encounter: Payer: Self-pay | Admitting: Family Medicine

## 2012-06-28 VITALS — BP 130/80 | Temp 98.1°F | Wt 181.0 lb

## 2012-06-28 DIAGNOSIS — R05 Cough: Secondary | ICD-10-CM

## 2012-06-28 DIAGNOSIS — R059 Cough, unspecified: Secondary | ICD-10-CM

## 2012-06-28 MED ORDER — BENZONATATE 200 MG PO CAPS: 200.0000 mg | ORAL_CAPSULE | Freq: Three times a day (TID) | ORAL | Status: DC | PRN

## 2012-06-28 MED ORDER — HYDROCODONE-HOMATROPINE 5-1.5 MG/5ML PO SYRP: 5.0000 mL | ORAL_SOLUTION | Freq: Four times a day (QID) | ORAL | Status: AC | PRN

## 2012-06-28 NOTE — Progress Notes (Signed)
  Subjective:    Patient ID: Caroline [name], female    DOB: 1961-03-28, 51 y.o.   MRN: [id number]  HPI  Recurrent cough. This is separate episode from when she was seen in September. She did improve after that episode. She's had about one week history of cough which is mostly nonproductive. Occasionally productive clear sputum. No dyspnea. No hemoptysis. No fever. Has some mild nasal and ear congestion. Not sleeping secondary to cough. Quit smoking about one year ago. No recent chest pains.   Review of Systems  Constitutional: Positive for fatigue. Negative for fever and chills.  HENT: Positive for congestion.   Respiratory: Positive for cough. Negative for shortness of breath, wheezing and stridor.   Cardiovascular: Negative for chest pain.       Objective:   Physical Exam  Constitutional: She appears well-developed and well-nourished.  HENT:  Right Ear: External ear normal.  Left Ear: External ear normal.  Mouth/Throat: Oropharynx is clear and moist.  Neck: Neck supple.  Cardiovascular: Normal rate and regular rhythm.   Pulmonary/Chest: Effort normal and breath sounds normal. No respiratory distress. She has no wheezes. She has no rales.  Lymphadenopathy:    She has no cervical adenopathy.          Assessment & Plan:  Cough probably secondary to acute viral bronchitis. Hycodan cough syrup for nighttime cough. Tessalon Perles 200 mg every 8 hours as needed for daytime cough. No indication for antibiotics at this time. Followup if symptoms worsen or persist

## 2012-06-28 NOTE — Patient Instructions (Addendum)
Call or follow up for any fever or increased shortness of breath. 

## 2012-07-12 ENCOUNTER — Other Ambulatory Visit: Payer: Self-pay | Admitting: Obstetrics

## 2012-07-12 DIAGNOSIS — N644 Mastodynia: Secondary | ICD-10-CM

## 2012-07-18 ENCOUNTER — Ambulatory Visit
Admission: RE | Admit: 2012-07-18 | Discharge: 2012-07-18 | Disposition: A | Discharge status: Home / Self Care | Payer: Managed Care, Other (non HMO) | Source: Ambulatory Visit | Attending: Obstetrics | Admitting: Obstetrics

## 2012-07-18 DIAGNOSIS — N644 Mastodynia: Secondary | ICD-10-CM

## 2012-07-20 ENCOUNTER — Telehealth: Payer: Self-pay | Admitting: Cardiology

## 2012-07-20 NOTE — Telephone Encounter (Signed)
Plz return call to pt regarding chest discomfort at time, Nitro taken at  1:15 pm today. She can be reached at hm# [phone]

## 2012-07-20 NOTE — Telephone Encounter (Signed)
Patient has had to use NTG for the last several days. Will have her come for office visit tomorrow.  Advised if she gets worse to emergency room

## 2012-07-21 ENCOUNTER — Ambulatory Visit (INDEPENDENT_AMBULATORY_CARE_PROVIDER_SITE_OTHER): Payer: Managed Care, Other (non HMO) | Admitting: Cardiology

## 2012-07-21 ENCOUNTER — Encounter: Payer: Self-pay | Admitting: Cardiology

## 2012-07-21 VITALS — BP 130/70 | HR 84 | Resp 18 | Ht 66.0 in | Wt 179.6 lb

## 2012-07-21 DIAGNOSIS — I2510 Atherosclerotic heart disease of native coronary artery without angina pectoris: Secondary | ICD-10-CM

## 2012-07-21 DIAGNOSIS — J40 Bronchitis, not specified as acute or chronic: Secondary | ICD-10-CM

## 2012-07-21 DIAGNOSIS — R1013 Epigastric pain: Secondary | ICD-10-CM

## 2012-07-21 DIAGNOSIS — I201 Angina pectoris with documented spasm: Secondary | ICD-10-CM

## 2012-07-21 NOTE — Patient Instructions (Signed)
Will get you referred to cardiac rehab, they will call you.  If you do not hear from them call us back  Your physician recommends that you continue on your current medications as directed. Please refer to the Current Medication list given to you today.  Keep your regularly scheduled appointment.

## 2012-07-21 NOTE — Assessment & Plan Note (Signed)
The patient has a mild chronic cough.  She is a former smoker.  She is not running any fever or producing any purulent sputum

## 2012-07-21 NOTE — Progress Notes (Signed)
[name] Date of Birth:  March 10, 1961 [facility] [address] [place], [place]  [address] [phone]         Fax   [phone]  History of Present Illness: This pleasant 51 year old woman is seen for a followup office visit. She has a history of Prinzmetal angina. 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 She sits at a desk but is up and about the office intermittently during the day. She has not been experiencing any calf pain to suggest deep vein thrombosis. The previous edema that she experienced in the hot weather has resolved. Since we last saw her she has had a upper respiratory infection treated by her primary care physician with Zithromax and subsequently with a course of prednisone.  She comes in today for a work in the office visit.  She was planning to go out of town for Thanksgiving and wanted to be checked ahead of time.  She has had some increased angina pectoralis over the past several days.  It does respond to sublingual nitroglycerin   Current Outpatient Prescriptions  Medication Sig Dispense Refill  . acetaminophen (TYLENOL) 500 MG tablet Take 500 mg by mouth every 6 (six) hours as needed. For pain      . aspirin 81 MG tablet Take 81 mg by mouth daily.        . isosorbide dinitrate (ISORDIL) 30 MG tablet Take 1 tablet (30 mg total) by mouth 3 (three) times daily.  90 tablet  6  . nitroGLYCERIN (NITROSTAT) 0.4 MG SL tablet Place 1 tablet (0.4 mg total) under the tongue every 5 (five) minutes as needed. For chest pain  25 tablet  prn  . pantoprazole (PROTONIX) 40 MG tablet TAKE ONE TABLET BY MOUTH EVERY DAY  30 tablet  8  . ranolazine (RANEXA) 1000 MG SR tablet Take 1 tablet (1,000 mg total) by mouth 2 (two) times daily.  60 tablet  11  . TAZTIA XT 360 MG 24 hr capsule TAKE ONE CAPSULE BY MOUTH EVERY DAY  30 each  6  . ALPRAZolam (XANAX) 0.25 MG tablet  Take 1 tablet (0.25 mg total) by mouth daily as needed.  30 tablet  3  . atorvastatin (LIPITOR) 20 MG tablet Take 1 tablet (20 mg total) by mouth daily.  30 tablet  11  . benzonatate (TESSALON) 200 MG capsule Take 1 capsule (200 mg total) by mouth 3 (three) times daily as needed for cough.  30 capsule  0  . furosemide (LASIX) 20 MG tablet Take 1 tab if needed      . HYDROcodone-homatropine (HYCODAN) 5-1.5 MG/5ML syrup       . [DISCONTINUED] diltiazem (CARDIZEM CD) 360 MG 24 hr capsule Take 1 capsule (360 mg total) by mouth daily.  30 capsule  6    No Known Allergies  Patient Active Problem List  Diagnosis  . Abdominal pain, epigastric  . Diarrhea  . CAD (coronary artery disease)  . Tobacco abuse  . Chest pain  . Tachycardia  . History of esophagitis  . Coronary vasospasm  . IBS (irritable bowel syndrome)  . Nonspecific elevation of levels of transaminase or lactic acid dehydrogenase (LDH)  . Constipation, acute  . Bronchitis    History  Smoking status  . Former Smoker -- 0.2 packs/day for 30 years  . Types: Cigarettes  . Quit date: 05/12/2011  Smokeless tobacco  . Never Used    History  Alcohol Use No    Comment: prev 4-5 per week.  Currently not drinking (08/2011)    Family History  Problem Relation Age of Onset  . Breast cancer Mother 58  . Heart disease Mother     died CHF 47  . Diabetes Mother   . Prostate cancer Father   . Hyperlipidemia Father   . Heart disease Father     s/p cabg - alive  . Colon cancer      First cousin on dads side     Review of Systems: Constitutional: no fever chills diaphoresis or fatigue or change in weight.  Head and neck: no hearing loss, no epistaxis, no photophobia or visual disturbance. Respiratory: No cough, shortness of breath or wheezing. Cardiovascular: No chest pain peripheral edema, palpitations. Gastrointestinal: No abdominal distention, no abdominal pain, no change in bowel habits hematochezia or  melena. Genitourinary: No dysuria, no frequency, no urgency, no nocturia. Musculoskeletal:No arthralgias, no back pain, no gait disturbance or myalgias. Neurological: No dizziness, no headaches, no numbness, no seizures, no syncope, no weakness, no tremors. Hematologic: No lymphadenopathy, no easy bruising. Psychiatric: No confusion, no hallucinations, no sleep disturbance.    Physical Exam: Filed Vitals:   07/21/12 1359  BP: 130/70  Pulse: 84  Resp: 18   the general appearance reveals a well-developed well-nourished woman in no distress.The head and neck exam reveals pupils equal and reactive.  Extraocular movements are full.  There is no scleral icterus.  The mouth and pharynx are normal.  The neck is supple.  The carotids reveal no bruits.  The jugular venous pressure is normal.  The  thyroid is not enlarged.  There is no lymphadenopathy.  The chest is clear to percussion and auscultation.  There are no rales or rhonchi.  Expansion of the chest is symmetrical.  The precordium is quiet.  The first heart sound is normal.  The second heart sound is physiologically split.  There is no murmur gallop rub or click.  There is no abnormal lift or heave.  The abdomen is soft and nontender.  The bowel sounds are normal.  The liver and spleen are not enlarged.  There are no abdominal masses.  There are no abdominal bruits.  Extremities reveal good pedal pulses.  There is no phlebitis or edema.  There is no cyanosis or clubbing.  Strength is normal and symmetrical in all extremities.  There is no lateralizing weakness.  There are no sensory deficits.  The skin is warm and dry.  There is no rash.  EKG shows normal sinus rhythm with first degree AV block and no ischemic changes.   Assessment / Plan: The patient is stable with her Prinzmetal angina.  It is okay for her to make the Thanksgiving trip to [place].  She will need to be sure to stay warm since cold weather it could exacerbate her vasospastic  angina.  recheck at her regular visit in February or sooner when necessary

## 2012-07-21 NOTE — Assessment & Plan Note (Signed)
The patient has been experiencing some occasional left upper quadrant discomfort which radiates through to her back.  She wondered if this was related to her heart and I do not think that it is.  She may be having gas pains from splenic flexure syndrome

## 2012-07-21 NOTE — Assessment & Plan Note (Signed)
Patient has a past history of ischemic heart disease with coronary vasospasm and Prinzmetal angina.  Her electrocardiogram today shows no ischemic changes.  She is on Isordil, ranexa, and diltiazem.

## 2012-08-08 ENCOUNTER — Telehealth: Payer: Self-pay | Admitting: *Deleted

## 2012-08-08 DIAGNOSIS — R079 Chest pain, unspecified: Secondary | ICD-10-CM

## 2012-08-08 MED ORDER — RANOLAZINE ER 500 MG PO TB12: 500.0000 mg | ORAL_TABLET | Freq: Two times a day (BID) | ORAL | Status: DC

## 2012-08-08 NOTE — Telephone Encounter (Signed)
Patient taking Ranexa 1000 mg twice a day and received notice recommendation that maximum dose should be 500 mg twice a day while taking Diltiazem.  Discussed with  Dr. Patty Sermons and will have patient decrease her dose to Ranexa 500 mg twice a day. Advised patient and sent new Rx to [facility].

## 2012-08-14 ENCOUNTER — Other Ambulatory Visit: Payer: Self-pay | Admitting: Cardiology

## 2012-08-14 MED ORDER — ATORVASTATIN CALCIUM 20 MG PO TABS: 20.0000 mg | ORAL_TABLET | Freq: Every day | ORAL | Status: DC

## 2012-08-14 MED ORDER — PANTOPRAZOLE SODIUM 40 MG PO TBEC: 40.0000 mg | DELAYED_RELEASE_TABLET | Freq: Every day | ORAL | Status: DC

## 2012-08-15 ENCOUNTER — Other Ambulatory Visit: Payer: Self-pay | Admitting: Obstetrics

## 2012-08-15 DIAGNOSIS — Z803 Family history of malignant neoplasm of breast: Secondary | ICD-10-CM

## 2012-08-29 ENCOUNTER — Telehealth: Payer: Self-pay | Admitting: Family Medicine

## 2012-08-29 NOTE — Telephone Encounter (Signed)
Pt's son was diagnosied positive for the flu (at [facility]) They suggested pt get on Tamaflu. Can you send script or does she need office visit?

## 2012-08-29 NOTE — Telephone Encounter (Signed)
Left voice message, it is not recommended to do the preventative measure at this time. Advised to wash hands good and also wipe down the house.

## 2012-09-07 ENCOUNTER — Encounter: Payer: Self-pay | Admitting: Family Medicine

## 2012-09-07 ENCOUNTER — Ambulatory Visit (INDEPENDENT_AMBULATORY_CARE_PROVIDER_SITE_OTHER): Payer: Managed Care, Other (non HMO) | Admitting: Family Medicine

## 2012-09-07 VITALS — BP 120/70 | Temp 98.6°F | Wt 179.0 lb

## 2012-09-07 DIAGNOSIS — R52 Pain, unspecified: Secondary | ICD-10-CM

## 2012-09-07 DIAGNOSIS — B349 Viral infection, unspecified: Secondary | ICD-10-CM

## 2012-09-07 DIAGNOSIS — B9789 Other viral agents as the cause of diseases classified elsewhere: Secondary | ICD-10-CM

## 2012-09-07 NOTE — Patient Instructions (Addendum)
Viral Syndrome  You or your child has Viral Syndrome. It is the most common infection causing "colds" and infections in the nose, throat, sinuses, and breathing tubes. Sometimes the infection causes nausea, vomiting, or diarrhea. The germ that causes the infection is a virus. No antibiotic or other medicine will kill it. There are medicines that you can take to make you or your child more comfortable.   HOME CARE INSTRUCTIONS    Rest in bed until you start to feel better.   If you have diarrhea or vomiting, eat small amounts of crackers and toast. Soup is helpful.   Do not give aspirin or medicine that contains aspirin to children.   Only take over-the-counter or prescription medicines for pain, discomfort, or fever as directed by your caregiver.  SEEK IMMEDIATE MEDICAL CARE IF:    You or your child has not improved within one week.   You or your child has pain that is not at least partially relieved by over-the-counter medicine.   Thick, colored mucus or blood is coughed up.   Discharge from the nose becomes thick yellow or green.   Diarrhea or vomiting gets worse.   There is any major change in your or your child's condition.   You or your child develops a skin rash, stiff neck, severe headache, or are unable to hold down food or fluid.   You or your child has an oral temperature above 102 F (38.9 C), not controlled by medicine.   Your baby is older than 3 months with a rectal temperature of 102 F (38.9 C) or higher.   Your baby is 3 months old or younger with a rectal temperature of 100.4 F (38 C) or higher.  Document Released: 08/01/2006 Document Revised: 11/08/2011 Document Reviewed: 08/02/2007  [employer] Patient Information 2013 [employer].

## 2012-09-07 NOTE — Progress Notes (Signed)
  Subjective:    Patient ID: Caroline [name], female    DOB: [birth year], 52 y.o.   MRN: [id number]  HPI Acute visit. Patient had onset yesterday of chills, body aches, and fatigue. No sore throat. Rare cough. No significant nasal congestion. Denies any nausea, vomiting, or diarrhea. Does have decreased appetite. Son had flu but this was almost a month ago. Her father was diagnosed with influenza last week. Patient had flu vaccine last Friday.   Review of Systems As per history of present illness    Objective:   Physical Exam  Constitutional: She appears well-developed and well-nourished.  HENT:  Right Ear: External ear normal.  Left Ear: External ear normal.  Mouth/Throat: Oropharynx is clear and moist.  Neck: Neck supple.  Cardiovascular: Normal rate and regular rhythm.   Pulmonary/Chest: Effort normal and breath sounds normal. No respiratory distress. She has no wheezes. She has no rales.  Lymphadenopathy:    She has no cervical adenopathy.  Skin: No rash noted.          Assessment & Plan:  Viral syndrome. Influenza screen negative. Suspect non-influenza virus. Reassurance. Work note written for yesterday and today

## 2012-09-13 ENCOUNTER — Ambulatory Visit: Payer: Managed Care, Other (non HMO) | Admitting: Family Medicine

## 2012-09-18 ENCOUNTER — Telehealth: Payer: Self-pay | Admitting: Cardiology

## 2012-09-18 NOTE — Telephone Encounter (Signed)
Pt rtn call to melinda °

## 2012-09-18 NOTE — Telephone Encounter (Signed)
Lightheaded, short of breath, nausea, a then a little discomfort left shoulder blade.  Feels like heart is pounding NTG did help some.

## 2012-09-18 NOTE — Telephone Encounter (Signed)
When spoke with patient she was trying to eat something to see if she would feel better. She stated that a couple of people in the office had been out with a stomach virus.  Patient at work and no way of checking blood pressure.  Discussed with Lawson Fiscal NP and will just have have patient continue same dose of medications of go to ED if no better.  When called patient back she stated she felt like she was trying to fight off something and again talked about her coworkers having stomach virus.  Patient concerned if she should start to vomit and unable to keep medications down what should she do.  Discussed this with Lawson Fiscal and ok to call in Zofran if she should start with vomiting.  Advised patient.  Did advised if any other problems to call back, verbalized understanding. Did state she thought she was feeling a little better.

## 2012-09-18 NOTE — Telephone Encounter (Signed)
New problem:    C/O  The last hour episode break out in sweaty, nausea, sob,  No chest pain.  Took nitro @ 10:45 .

## 2012-09-18 NOTE — Telephone Encounter (Signed)
Left message to call back  

## 2012-10-20 ENCOUNTER — Ambulatory Visit (INDEPENDENT_AMBULATORY_CARE_PROVIDER_SITE_OTHER): Payer: Managed Care, Other (non HMO) | Admitting: Cardiology

## 2012-10-20 VITALS — BP 112/73 | HR 82 | Ht 64.0 in | Wt 180.0 lb

## 2012-10-20 DIAGNOSIS — R000 Tachycardia, unspecified: Secondary | ICD-10-CM

## 2012-10-20 DIAGNOSIS — I2510 Atherosclerotic heart disease of native coronary artery without angina pectoris: Secondary | ICD-10-CM

## 2012-10-20 DIAGNOSIS — J40 Bronchitis, not specified as acute or chronic: Secondary | ICD-10-CM

## 2012-10-20 DIAGNOSIS — R079 Chest pain, unspecified: Secondary | ICD-10-CM

## 2012-10-20 MED ORDER — ISOSORBIDE DINITRATE 30 MG PO TABS: 30.0000 mg | ORAL_TABLET | Freq: Three times a day (TID) | ORAL | Status: DC

## 2012-10-20 MED ORDER — NITROGLYCERIN 0.4 MG SL SUBL: 0.4000 mg | SUBLINGUAL_TABLET | SUBLINGUAL | Status: DC | PRN

## 2012-10-20 NOTE — Patient Instructions (Signed)
Your physician recommends that you continue on your current medications as directed. Please refer to the Current Medication list given to you today.  Your physician recommends that you schedule a follow-up appointment in: 4 month ov/ekg 

## 2012-10-20 NOTE — Assessment & Plan Note (Signed)
The patient has not been aware of any recent tachycardia or palpitations.  She was trying to walk for exercise when the weather permits.

## 2012-10-20 NOTE — Progress Notes (Signed)
[name] Date of Birth:  27-Jul-1961 [facility] [address] [place], [place]  [address] [phone]         Fax   [phone]  History of Present Illness: This pleasant 52year-old woman is seen for a followup office visit. She has a history of Prinzmetal angina. 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 She sits at a desk but is up and about the office intermittently during the day. She has not been experiencing any calf pain to suggest deep vein thrombosis. The previous edema that she experienced in the hot weather has resolved. Since we last saw her she has had a upper respiratory infection treated by her primary care physician with Zithromax and subsequently with a course of prednisone.  Since last visit she's been doing as well from the cardiac standpoint.   Current Outpatient Prescriptions  Medication Sig Dispense Refill  . ALPRAZolam (XANAX) 0.25 MG tablet Take 1 tablet (0.25 mg total) by mouth daily as needed.  30 tablet  3  . aspirin 81 MG tablet Take 81 mg by mouth daily.        Marland Kitchen atorvastatin (LIPITOR) 20 MG tablet Take 1 tablet (20 mg total) by mouth daily.  30 tablet  11  . benzonatate (TESSALON) 200 MG capsule Take 1 capsule (200 mg total) by mouth 3 (three) times daily as needed for cough.  30 capsule  0  . furosemide (LASIX) 20 MG tablet Take 1 tab if needed      . HYDROcodone-homatropine (HYCODAN) 5-1.5 MG/5ML syrup       . isosorbide dinitrate (ISORDIL) 30 MG tablet Take 1 tablet (30 mg total) by mouth 3 (three) times daily.  90 tablet  11  . nitroGLYCERIN (NITROSTAT) 0.4 MG SL tablet Place 1 tablet (0.4 mg total) under the tongue every 5 (five) minutes as needed. For chest pain  25 tablet  prn  . pantoprazole (PROTONIX) 40 MG tablet Take 1 tablet (40 mg total) by mouth daily.  30 tablet  11  . ranolazine (RANEXA) 500 MG 12 hr tablet Take 1 tablet  (500 mg total) by mouth 2 (two) times daily.  60 tablet  11  . TAZTIA XT 360 MG 24 hr capsule TAKE ONE CAPSULE BY MOUTH EVERY DAY  30 each  6  . acetaminophen (TYLENOL) 500 MG tablet Take 500 mg by mouth every 6 (six) hours as needed. For pain      . [DISCONTINUED] diltiazem (CARDIZEM CD) 360 MG 24 hr capsule Take 1 capsule (360 mg total) by mouth daily.  30 capsule  6   No current facility-administered medications for this visit.    No Known Allergies  Patient Active Problem List  Diagnosis  . Abdominal pain, epigastric  . Diarrhea  . CAD (coronary artery disease)  . Tobacco abuse  . Chest pain  . Tachycardia  . History of esophagitis  . Coronary vasospasm  . IBS (irritable bowel syndrome)  . Nonspecific elevation of levels of transaminase or lactic acid dehydrogenase (LDH)  . Constipation, acute  . Bronchitis    History  Smoking status  . Former Smoker -- 0.25 packs/day for 30 years  . Types: Cigarettes  . Quit date: 05/12/2011  Smokeless tobacco  . Never Used    History  Alcohol Use No    Comment: prev 4-5 per week.  Currently not drinking (  08/2011)    Family History  Problem Relation Age of Onset  . Breast cancer Mother 47  . Heart disease Mother     died CHF 43  . Diabetes Mother   . Prostate cancer Father   . Hyperlipidemia Father   . Heart disease Father     s/p cabg - alive  . Colon cancer      First cousin on dads side     Review of Systems: Constitutional: no fever chills diaphoresis or fatigue or change in weight.  Head and neck: no hearing loss, no epistaxis, no photophobia or visual disturbance. Respiratory: No cough, shortness of breath or wheezing. Cardiovascular: No chest pain peripheral edema, palpitations. Gastrointestinal: No abdominal distention, no abdominal pain, no change in bowel habits hematochezia or melena. Genitourinary: No dysuria, no frequency, no urgency, no nocturia. Musculoskeletal:No arthralgias, no back pain, no gait  disturbance or myalgias. Neurological: No dizziness, no headaches, no numbness, no seizures, no syncope, no weakness, no tremors. Hematologic: No lymphadenopathy, no easy bruising. Psychiatric: No confusion, no hallucinations, no sleep disturbance.    Physical Exam: Filed Vitals:   10/20/12 1419  BP: 112/73  Pulse: 82   the general appearance reveals a well-developed well-nourished woman in no distress.The head and neck exam reveals pupils equal and reactive.  Extraocular movements are full.  There is no scleral icterus.  The mouth and pharynx are normal.  The neck is supple.  The carotids reveal no bruits.  The jugular venous pressure is normal.  The  thyroid is not enlarged.  There is no lymphadenopathy.    There are mild expiratory wheezes bilaterally.  Expansion of the chest is symmetrical.  The precordium is quiet.  The first heart sound is normal.  The second heart sound is physiologically split.  There is no murmur gallop rub or click.  There is no abnormal lift or heave.  The abdomen is soft and nontender.  The bowel sounds are normal.  The liver and spleen are not enlarged.  There are no abdominal masses.  There are no abdominal bruits.  Extremities reveal good pedal pulses.  There is no phlebitis or edema.  There is no cyanosis or clubbing.  Strength is normal and symmetrical in all extremities.  There is no lateralizing weakness.  There are no sensory deficits.  The skin is warm and dry.  There is no rash.     Assessment / Plan: Continue same medication.  We refilled her sublingual nitroglycerin and her isosorbide dinitrate today.  Recheck in 4 months for office visit and EKG

## 2012-10-20 NOTE — Assessment & Plan Note (Signed)
The patient has Prinzmetal angina which has responded to a combination of nitrates and Ranexa.  She rarely has to take any sublingual nitroglycerin

## 2012-10-20 NOTE — Assessment & Plan Note (Signed)
The patient still has had some recent wheezing and cough related to a recent episode of bronchitis which she states is now on the wane.  No recent fever or chills

## 2012-11-03 ENCOUNTER — Emergency Department (HOSPITAL_BASED_OUTPATIENT_CLINIC_OR_DEPARTMENT_OTHER): Payer: Managed Care, Other (non HMO)

## 2012-11-03 ENCOUNTER — Emergency Department (HOSPITAL_BASED_OUTPATIENT_CLINIC_OR_DEPARTMENT_OTHER)
Admission: EM | Admit: 2012-11-03 | Discharge: 2012-11-03 | Disposition: A | Discharge status: Home / Self Care | Payer: Managed Care, Other (non HMO) | Attending: Emergency Medicine | Admitting: Emergency Medicine

## 2012-11-03 ENCOUNTER — Encounter (HOSPITAL_BASED_OUTPATIENT_CLINIC_OR_DEPARTMENT_OTHER): Payer: Self-pay | Admitting: *Deleted

## 2012-11-03 DIAGNOSIS — S42301A Unspecified fracture of shaft of humerus, right arm, initial encounter for closed fracture: Secondary | ICD-10-CM

## 2012-11-03 DIAGNOSIS — Z79899 Other long term (current) drug therapy: Secondary | ICD-10-CM | POA: Insufficient documentation

## 2012-11-03 DIAGNOSIS — Z8679 Personal history of other diseases of the circulatory system: Secondary | ICD-10-CM | POA: Insufficient documentation

## 2012-11-03 DIAGNOSIS — Y929 Unspecified place or not applicable: Secondary | ICD-10-CM | POA: Insufficient documentation

## 2012-11-03 DIAGNOSIS — K219 Gastro-esophageal reflux disease without esophagitis: Secondary | ICD-10-CM | POA: Insufficient documentation

## 2012-11-03 DIAGNOSIS — S42309A Unspecified fracture of shaft of humerus, unspecified arm, initial encounter for closed fracture: Secondary | ICD-10-CM | POA: Insufficient documentation

## 2012-11-03 DIAGNOSIS — W19XXXA Unspecified fall, initial encounter: Secondary | ICD-10-CM

## 2012-11-03 DIAGNOSIS — Z9861 Coronary angioplasty status: Secondary | ICD-10-CM | POA: Insufficient documentation

## 2012-11-03 DIAGNOSIS — Z7982 Long term (current) use of aspirin: Secondary | ICD-10-CM | POA: Insufficient documentation

## 2012-11-03 DIAGNOSIS — W010XXA Fall on same level from slipping, tripping and stumbling without subsequent striking against object, initial encounter: Secondary | ICD-10-CM | POA: Insufficient documentation

## 2012-11-03 DIAGNOSIS — I2510 Atherosclerotic heart disease of native coronary artery without angina pectoris: Secondary | ICD-10-CM | POA: Insufficient documentation

## 2012-11-03 DIAGNOSIS — Y939 Activity, unspecified: Secondary | ICD-10-CM | POA: Insufficient documentation

## 2012-11-03 DIAGNOSIS — IMO0002 Reserved for concepts with insufficient information to code with codable children: Secondary | ICD-10-CM | POA: Insufficient documentation

## 2012-11-03 DIAGNOSIS — Z8719 Personal history of other diseases of the digestive system: Secondary | ICD-10-CM | POA: Insufficient documentation

## 2012-11-03 DIAGNOSIS — E785 Hyperlipidemia, unspecified: Secondary | ICD-10-CM | POA: Insufficient documentation

## 2012-11-03 DIAGNOSIS — Z87891 Personal history of nicotine dependence: Secondary | ICD-10-CM | POA: Insufficient documentation

## 2012-11-03 LAB — CBC
HCT: 42.6 % (ref 36.0–46.0)
Hemoglobin: 14.4 g/dL (ref 12.0–15.0)
MCHC: 33.8 g/dL (ref 30.0–36.0)
MCV: 95.9 fL (ref 78.0–100.0)

## 2012-11-03 LAB — BASIC METABOLIC PANEL
BUN: 19 mg/dL (ref 6–23)
Creatinine, Ser: 0.8 mg/dL (ref 0.50–1.10)
GFR calc non Af Amer: 83 mL/min — ABNORMAL LOW (ref >90)
Glucose, Bld: 134 mg/dL — ABNORMAL HIGH (ref 70–99)
Potassium: 4.4 mEq/L (ref 3.5–5.1)

## 2012-11-03 LAB — PROTIME-INR: INR: 1.03 (ref 0.00–1.49)

## 2012-11-03 MED ORDER — HYDROMORPHONE HCL PF 1 MG/ML IJ SOLN
1.0000 mg | Freq: Once | INTRAMUSCULAR | Status: AC
  Administered 2012-11-03: 1 mg via INTRAMUSCULAR
  Filled 2012-11-03: qty 1

## 2012-11-03 MED ORDER — HYDROCODONE-ACETAMINOPHEN 10-325 MG PO TABS: 1.0000 | ORAL_TABLET | Freq: Four times a day (QID) | ORAL | Status: DC | PRN

## 2012-11-03 MED ORDER — HYDROMORPHONE HCL PF 1 MG/ML IJ SOLN
0.5000 mg | Freq: Once | INTRAMUSCULAR | Status: AC
  Administered 2012-11-03: 0.5 mg via INTRAVENOUS
  Filled 2012-11-03: qty 1

## 2012-11-03 NOTE — ED Provider Notes (Addendum)
History     CSN: [id number]  Arrival date & time 11/03/12  1100   First MD Initiated Contact with Patient 11/03/12 1103      Chief Complaint  Patient presents with  . Arm Pain    (Consider location/radiation/quality/duration/timing/severity/associated sxs/prior treatment) HPI The patient presents immediately after sustaining a fall with significant resultant right arm pain.  She states that she slipped, struck her arm against a wall.  She denies other significant trauma, any loss of consciousness, any subsequent confusion, disorientation, chest pain, dyspnea, or other focal complaints.  She has not been ambulatory secondary to pain in the arm.  No relief with 250 mcg of fentanyl in route.  No dysesthesia or weakness, the patient is unwilling to move the elbow or wrist secondary to pain in the arm. Past Medical History  Diagnosis Date  . GERD (gastroesophageal reflux disease)   . Hyperlipidemia   . Hx of cholecystectomy   . Hx of appendectomy   . CAD (coronary artery disease)     has documented coronary vasospasm but with underlying 40% prox LAD, 30% prox LCX and 70 to 80% proximal 1st DX and 30% RCA; recurrent vasospasm with subsequent cath in Jan 2013.   Marland Kitchen Palpitations     tachycardia on holter; treated with beta blockers  . Coronary vasospasm   . Esophagitis   . IBS (irritable bowel syndrome)     Past Surgical History  Procedure Laterality Date  . Cholecystectomy  1982  . Appendectomy  1976  . Breast surgery  2002    biopsy  . Cardiac catheterization  2011  . Upper gastrointestinal endoscopy  01/11/2011    esophagitis  . Cardiac catheterization  Sept 2012    With documented vasospasm but with residual disease; managed medically    Family History  Problem Relation Age of Onset  . Breast cancer Mother 38  . Heart disease Mother     died CHF 43  . Diabetes Mother   . Prostate cancer Father   . Hyperlipidemia Father   . Heart disease Father     s/p cabg - alive  .  Colon cancer      First cousin on dads side     History  Substance Use Topics  . Smoking status: Former Smoker -- 0.25 packs/day for 30 years    Types: Cigarettes    Quit date: 05/12/2011  . Smokeless tobacco: Never Used  . Alcohol Use: No     Comment: prev 4-5 per week.  Currently not drinking (08/2011)    OB History   Grav Para Term Preterm Abortions TAB SAB Ect Mult Living                  Review of Systems  Constitutional:       Per HPI, otherwise negative  HENT:       Per HPI, otherwise negative  Respiratory:       Per HPI, otherwise negative  Cardiovascular:       Per HPI, otherwise negative  Gastrointestinal: Negative for vomiting.  Endocrine:       Negative aside from HPI  Genitourinary:       Neg aside from HPI   Musculoskeletal:       Per HPI, otherwise negative  Skin: Negative.   Neurological: Negative for syncope.    Allergies  Review of patient's allergies indicates no known allergies.  Home Medications   Current Outpatient Rx  Name  Route  Sig  Dispense  Refill  . acetaminophen (TYLENOL) 500 MG tablet   Oral   Take 500 mg by mouth every 6 (six) hours as needed. For pain         . ALPRAZolam (XANAX) 0.25 MG tablet   Oral   Take 1 tablet (0.25 mg total) by mouth daily as needed.   30 tablet   3   . aspirin 81 MG tablet   Oral   Take 81 mg by mouth daily.           Marland Kitchen atorvastatin (LIPITOR) 20 MG tablet   Oral   Take 1 tablet (20 mg total) by mouth daily.   30 tablet   11   . benzonatate (TESSALON) 200 MG capsule   Oral   Take 1 capsule (200 mg total) by mouth 3 (three) times daily as needed for cough.   30 capsule   0   . furosemide (LASIX) 20 MG tablet      Take 1 tab if needed         . HYDROcodone-homatropine (HYCODAN) 5-1.5 MG/5ML syrup               . isosorbide dinitrate (ISORDIL) 30 MG tablet   Oral   Take 1 tablet (30 mg total) by mouth 3 (three) times daily.   90 tablet   11   . nitroGLYCERIN (NITROSTAT)  0.4 MG SL tablet   Sublingual   Place 1 tablet (0.4 mg total) under the tongue every 5 (five) minutes as needed. For chest pain   25 tablet   prn   . pantoprazole (PROTONIX) 40 MG tablet   Oral   Take 1 tablet (40 mg total) by mouth daily.   30 tablet   11   . ranolazine (RANEXA) 500 MG 12 hr tablet   Oral   Take 1 tablet (500 mg total) by mouth 2 (two) times daily.   60 tablet   11     NEW DOSE   . TAZTIA XT 360 MG 24 hr capsule      TAKE ONE CAPSULE BY MOUTH EVERY DAY   30 each   6     BP 107/73  Pulse 71  Temp(Src) 97.4 F (36.3 C) (Oral)  Resp 20  SpO2 97%  Physical Exam  Nursing note and vitals reviewed. Constitutional: She is oriented to person, place, and time. She appears well-developed and well-nourished. She appears distressed.  HENT:  Head: Normocephalic and atraumatic.  Eyes: Conjunctivae and EOM are normal.  Cardiovascular: Normal rate and regular rhythm.   Pulmonary/Chest: Effort normal and breath sounds normal. No stridor. No respiratory distress.  Abdominal: She exhibits no distension.  Musculoskeletal: She exhibits no edema.  Patient arrived with arm immobilized in a soft splint.  On removal, there is a notable deformity of the proximal humerus, with significant tenderness to palpation. The right elbow and right wrist are grossly normal, though the patient will not move the elbow secondary to pain.  She can flex and extend the wrist minimally.  Grip strength is appropriate.  Sensation is appropriate.  Neurological: She is alert and oriented to person, place, and time. No cranial nerve deficit.  Skin: Skin is warm and dry.  Psychiatric: She has a normal mood and affect.    ED Course  ORTHOPEDIC INJURY TREATMENT Date/Time: 11/03/2012 12:30 PM Performed by: Gerhard Munch Authorized by: Gerhard Munch Consent: Verbal consent obtained. The procedure was performed in an emergent situation. Risks and benefits: risks, benefits and  alternatives  were discussed Consent given by: patient Patient understanding: patient states understanding of the procedure being performed Patient consent: the patient's understanding of the procedure matches consent given Procedure consent: procedure consent matches procedure scheduled Relevant documents: relevant documents present and verified Test results: test results available and properly labeled Site marked: the operative site was marked Imaging studies: imaging studies available Required items: required blood products, implants, devices, and special equipment available Patient identity confirmed: verbally with patient Injury location: upper arm Location details: right upper arm Injury type: fracture Pre-procedure neurovascular assessment: neurovascularly intact Pre-procedure distal perfusion: normal Pre-procedure neurological function: normal Pre-procedure range of motion: reduced Local anesthesia used: no (dilaudid) Patient sedated: no Manipulation performed: yes Skin traction used: no Skeletal traction used: yes Reduction successful: yes Immobilization: splint and sling Splint type: long arm Supplies used: Ortho-Glass Post-procedure neurovascular assessment: post-procedure neurovascularly intact Post-procedure distal perfusion: normal Post-procedure neurological function: normal Post-procedure range of motion: unchanged Patient tolerance: Patient tolerated the procedure well with no immediate complications.   (including critical care time)  Labs Reviewed  PROTIME-INR  CBC  BASIC METABOLIC PANEL   No results found.   1. Fall, initial encounter    I interpreted x-ray, which demonstrates spiral fracture, with multiple pieces.  Additional fracture evaluation required additional imaging.  I then discussed the patient's case with our orthopedist.  After he reviewed the films, the patient was thought to be stable for discharge with close outpatient followup.    Date:  11/03/2012  Rate: 74  Rhythm: normal sinus rhythm  QRS Axis: normal  Intervals: normal  ST/T Wave abnormalities: normal  Conduction Disutrbances:nonspecific intraventricular conduction delay  Narrative Interpretation:   Old EKG Reviewed: none available Unremarkable ecg  MDM  This patient presents after a mechanical fall with right arm pain and gross deformity.  The patient's distally neurovascularly intact.  The patient's x-ray demonstrates the humeral head is appropriately located, and given the patient's intact distal neurovascular status, she was splinted, and after consultation with our orthopedist was discharged with analgesics to follow up with him in the office next week.        Gerhard Munch, MD 11/03/12 1342  Gerhard Munch, MD 11/03/12 [phone]

## 2012-11-03 NOTE — ED Notes (Signed)
At home heard tree falling outside went to see what it was and slipped on hardwood floors states she hit her upper arm on door facing ems splinted arm and have given her of fentanyl pt rates pain 8/10

## 2012-11-10 ENCOUNTER — Other Ambulatory Visit: Payer: Self-pay | Admitting: *Deleted

## 2012-11-10 MED ORDER — DILTIAZEM HCL ER BEADS 360 MG PO CP24: 360.0000 mg | ORAL_CAPSULE | Freq: Every day | ORAL | Status: DC

## 2012-11-10 NOTE — Telephone Encounter (Signed)
Fax Received. Refill Completed. Octavia Chowoe (R.M.A)   

## 2012-12-01 ENCOUNTER — Telehealth: Payer: Self-pay | Admitting: Cardiology

## 2012-12-01 NOTE — Telephone Encounter (Signed)
Okay to add Vit D3   2000 u daily

## 2012-12-01 NOTE — Telephone Encounter (Signed)
New problem    Patient has Questions regarding vitamins  supplement

## 2012-12-01 NOTE — Telephone Encounter (Signed)
Pt.notified

## 2012-12-01 NOTE — Telephone Encounter (Signed)
Pt wants to take Vitamin D but wants Dr. Yevonne Pax ok and dosing instructions prior to starting.

## 2013-01-02 ENCOUNTER — Other Ambulatory Visit: Payer: Self-pay | Admitting: Obstetrics

## 2013-01-02 DIAGNOSIS — Z780 Asymptomatic menopausal state: Secondary | ICD-10-CM

## 2013-01-17 ENCOUNTER — Ambulatory Visit
Admission: RE | Admit: 2013-01-17 | Discharge: 2013-01-17 | Disposition: A | Discharge status: Home / Self Care | Payer: Managed Care, Other (non HMO) | Source: Ambulatory Visit | Attending: Obstetrics | Admitting: Obstetrics

## 2013-01-17 DIAGNOSIS — Z780 Asymptomatic menopausal state: Secondary | ICD-10-CM

## 2013-01-17 LAB — HM DEXA SCAN

## 2013-02-06 ENCOUNTER — Encounter: Payer: Self-pay | Admitting: Cardiology

## 2013-02-06 ENCOUNTER — Ambulatory Visit: Payer: Managed Care, Other (non HMO) | Admitting: Cardiology

## 2013-02-06 ENCOUNTER — Encounter: Payer: Self-pay | Admitting: *Deleted

## 2013-02-06 ENCOUNTER — Ambulatory Visit (INDEPENDENT_AMBULATORY_CARE_PROVIDER_SITE_OTHER): Payer: Managed Care, Other (non HMO) | Admitting: Cardiology

## 2013-02-06 VITALS — BP 122/70 | HR 80 | Ht 63.0 in | Wt 172.4 lb

## 2013-02-06 DIAGNOSIS — S42309D Unspecified fracture of shaft of humerus, unspecified arm, subsequent encounter for fracture with routine healing: Secondary | ICD-10-CM

## 2013-02-06 DIAGNOSIS — I201 Angina pectoris with documented spasm: Secondary | ICD-10-CM

## 2013-02-06 DIAGNOSIS — S42301D Unspecified fracture of shaft of humerus, right arm, subsequent encounter for fracture with routine healing: Secondary | ICD-10-CM

## 2013-02-06 DIAGNOSIS — E7800 Pure hypercholesterolemia, unspecified: Secondary | ICD-10-CM

## 2013-02-06 DIAGNOSIS — R000 Tachycardia, unspecified: Secondary | ICD-10-CM

## 2013-02-06 HISTORY — DX: Unspecified fracture of shaft of humerus, right arm, subsequent encounter for fracture with routine healing: S42.301D

## 2013-02-06 NOTE — Patient Instructions (Addendum)
Your physician recommends that you continue on your current medications as directed. Please refer to the Current Medication list given to you today.  Your physician wants you to follow-up in: 4 months with fasting labs (lp/bmet/hfp) You will receive a reminder letter in the mail two months in advance. If you don't receive a letter, please call our office to schedule the follow-up appointment.  

## 2013-02-06 NOTE — Progress Notes (Signed)
Caroline [name] Date of Birth:  1960/12/18 [facility] [address] [place], [place]  [address] [phone]  Fax   [phone]  HPI: This pleasant 52year-old woman is seen for a followup office visit. She has a history of Prinzmetal angina. 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 She sits at a desk but is up and about the office intermittently during the day. She has not been experiencing any calf pain to suggest deep vein thrombosis.  Since we last saw the patient she had a bad fall in her home on March 7 she shattered her right upper arm in multiple areas.  She was unable to have surgery because of the nature of the great and she has been immobilized in a sling for the past 13 weeks.  Just last week she went back to work. Since last visit the patient has begun to have hot flashes consistent with menopause.  She previously been also having night sweats.  She is going to try some black cohosh.  Current Outpatient Prescriptions  Medication Sig Dispense Refill  . acetaminophen (TYLENOL) 500 MG tablet Take 500 mg by mouth every 6 (six) hours as needed. For pain      . ALPRAZolam (XANAX) 0.25 MG tablet Take 1 tablet (0.25 mg total) by mouth daily as needed.  30 tablet  3  . aspirin 81 MG tablet Take 81 mg by mouth daily.        Marland Kitchen atorvastatin (LIPITOR) 20 MG tablet Take 1 tablet (20 mg total) by mouth daily.  30 tablet  11  . benzonatate (TESSALON) 200 MG capsule Take 1 capsule (200 mg total) by mouth 3 (three) times daily as needed for cough.  30 capsule  0  . diltiazem (TAZTIA XT) 360 MG 24 hr capsule Take 1 capsule (360 mg total) by mouth daily.  30 capsule  5  . furosemide (LASIX) 20 MG tablet Take 1 tab if needed      . HYDROcodone-acetaminophen (NORCO) 10-325 MG per tablet Take 1 tablet by mouth every 6 (six) hours as needed for pain.  20 tablet  0  . isosorbide  dinitrate (ISORDIL) 30 MG tablet Take 1 tablet (30 mg total) by mouth 3 (three) times daily.  90 tablet  11  . nitroGLYCERIN (NITROSTAT) 0.4 MG SL tablet Place 1 tablet (0.4 mg total) under the tongue every 5 (five) minutes as needed. For chest pain  25 tablet  prn  . pantoprazole (PROTONIX) 40 MG tablet Take 1 tablet (40 mg total) by mouth daily.  30 tablet  11  . ranolazine (RANEXA) 500 MG 12 hr tablet Take 1 tablet (500 mg total) by mouth 2 (two) times daily.  60 tablet  11  . [DISCONTINUED] diltiazem (CARDIZEM CD) 360 MG 24 hr capsule Take 1 capsule (360 mg total) by mouth daily.  30 capsule  6   No current facility-administered medications for this visit.    No Known Allergies  Patient Active Problem List   Diagnosis Date Noted  . Bronchitis 09/29/2011  . Constipation, acute 09/17/2011  . IBS (irritable bowel syndrome) 09/15/2011  . Nonspecific elevation of levels of transaminase or lactic acid dehydrogenase (LDH) 09/15/2011  . Coronary vasospasm   . Tachycardia 07/28/2011  . History of esophagitis 07/28/2011  . Chest pain 07/12/2011  . CAD (coronary artery disease) 05/28/2011  . Tobacco abuse 05/28/2011  .  Abdominal pain, epigastric 12/04/2010  . Diarrhea 12/04/2010    History  Smoking status  . Former Smoker -- 0.25 packs/day for 30 years  . Types: Cigarettes  . Quit date: 05/12/2011  Smokeless tobacco  . Never Used    History  Alcohol Use No    Comment: prev 4-5 per week.  Currently not drinking (08/2011)    Family History  Problem Relation Age of Onset  . Breast cancer Mother 59  . Heart disease Mother     died CHF 68  . Diabetes Mother   . Prostate cancer Father   . Hyperlipidemia Father   . Heart disease Father     s/p cabg - alive  . Colon cancer      First cousin on dads side     Review of Systems: The patient denies any heat or cold intolerance.  No weight gain or weight loss.  The patient denies headaches or blurry vision.  There is no cough or  sputum production.  The patient denies dizziness.  There is no hematuria or hematochezia.  The patient denies any muscle aches or arthritis.  The patient denies any rash.  The patient denies frequent falling or instability.  There is no history of depression or anxiety.  All other systems were reviewed and are negative.   Physical Exam: Filed Vitals:   02/06/13 1436  BP: 122/70  Pulse: 80   the general appearance reveals a well-developed well-nourished woman in no distress.The head and neck exam reveals pupils equal and reactive.  Extraocular movements are full.  There is no scleral icterus.  The mouth and pharynx are normal.  The neck is supple.  The carotids reveal no bruits.  The jugular venous pressure is normal.  The  thyroid is not enlarged.  There is no lymphadenopathy.  The chest is clear to percussion and auscultation.  There are no rales or rhonchi.  Expansion of the chest is symmetrical.  The precordium is quiet.  The first heart sound is normal.  The second heart sound is physiologically split.  There is no murmur gallop rub or click.  There is no abnormal lift or heave.  The abdomen is soft and nontender.  The bowel sounds are normal.  The liver and spleen are not enlarged.  There are no abdominal masses.  There are no abdominal bruits.  Extremities reveal good pedal pulses.  There is no phlebitis or edema.  There is no cyanosis or clubbing.  Strength is normal and symmetrical in all extremities.  There is no lateralizing weakness.  There are no sensory deficits.  The skin is warm and dry.  There is no rash.  The right upper arm is immobilized in a tight sling.  EKG shows normal sinus rhythm with first degree AV block and no ischemic changes.    Assessment / Plan:  Continue same medication.  Recheck in 4 months for followup office visit lipid panel hepatic function panel and basal metabolic panel

## 2013-02-06 NOTE — Assessment & Plan Note (Signed)
The patient is doing reasonably well with her right humerus fracture.  She has returned to work.  The fracture has limited her ability to exercise aerobically over the past several months.

## 2013-02-06 NOTE — Assessment & Plan Note (Signed)
The patient has had very infrequent chest discomfort.  She rarely has to take a sublingual nitroglycerin.

## 2013-02-06 NOTE — Assessment & Plan Note (Signed)
The patient has not been experiencing any tachycardia or palpitations

## 2013-02-27 ENCOUNTER — Other Ambulatory Visit: Payer: Self-pay | Admitting: Orthopaedic Surgery

## 2013-02-27 DIAGNOSIS — M898X2 Other specified disorders of bone, upper arm: Secondary | ICD-10-CM

## 2013-02-27 DIAGNOSIS — M79621 Pain in right upper arm: Secondary | ICD-10-CM

## 2013-03-01 ENCOUNTER — Ambulatory Visit
Admission: RE | Admit: 2013-03-01 | Discharge: 2013-03-01 | Disposition: A | Discharge status: Home / Self Care | Payer: Managed Care, Other (non HMO) | Source: Ambulatory Visit | Attending: Orthopaedic Surgery | Admitting: Orthopaedic Surgery

## 2013-03-01 DIAGNOSIS — M898X2 Other specified disorders of bone, upper arm: Secondary | ICD-10-CM

## 2013-03-01 DIAGNOSIS — M79621 Pain in right upper arm: Secondary | ICD-10-CM

## 2013-04-16 ENCOUNTER — Ambulatory Visit (INDEPENDENT_AMBULATORY_CARE_PROVIDER_SITE_OTHER): Payer: Managed Care, Other (non HMO) | Admitting: Family Medicine

## 2013-04-16 ENCOUNTER — Encounter: Payer: Self-pay | Admitting: Family Medicine

## 2013-04-16 VITALS — BP 132/82 | HR 90 | Temp 98.2°F | Wt 172.0 lb

## 2013-04-16 DIAGNOSIS — J019 Acute sinusitis, unspecified: Secondary | ICD-10-CM

## 2013-04-16 MED ORDER — BENZONATATE 200 MG PO CAPS: 200.0000 mg | ORAL_CAPSULE | Freq: Three times a day (TID) | ORAL | Status: DC | PRN

## 2013-04-16 MED ORDER — AZITHROMYCIN 250 MG PO TABS: ORAL_TABLET | ORAL | Status: AC

## 2013-04-16 NOTE — Progress Notes (Signed)
  Subjective:    Patient ID: Caroline [name], female    DOB: 12/05/1960, 52 y.o.   MRN: [id number]  HPI Acute illness. Onset last week.  Has cough, malaise, low grade fever, achy, headaches, and yellowish nasal discharge.   She's had some progressive maxillary facial pain and pressure. She is concerned about developing acute sinusitis She quit smoking back in 2012. She's been taking some Tessalon for cough and requesting refill  Patient had fall last March with a complicated comminuted right humeral fracture. She's had ongoing issues since then and is followed closely by orthopedics  Past Medical History  Diagnosis Date  . GERD (gastroesophageal reflux disease)   . Hyperlipidemia   . Hx of cholecystectomy   . Hx of appendectomy   . CAD (coronary artery disease)     has documented coronary vasospasm but with underlying 40% prox LAD, 30% prox LCX and 70 to 80% proximal 1st DX and 30% RCA; recurrent vasospasm with subsequent cath in Jan 2013.   Marland Kitchen Palpitations     tachycardia on holter; treated with beta blockers  . Coronary vasospasm   . Esophagitis   . IBS (irritable bowel syndrome)    Past Surgical History  Procedure Laterality Date  . Cholecystectomy  1982  . Appendectomy  1976  . Breast surgery  2002    biopsy  . Cardiac catheterization  2011  . Upper gastrointestinal endoscopy  01/11/2011    esophagitis  . Cardiac catheterization  Sept 2012    With documented vasospasm but with residual disease; managed medically    reports that she quit smoking about 23 months ago. Her smoking use included Cigarettes. She has a 7.5 pack-year smoking history. She has never used smokeless tobacco. She reports that she does not drink alcohol or use illicit drugs. family history includes Breast cancer (age of onset: 61) in her mother; Colon cancer in an other family member; Diabetes in her mother; Heart disease in her father and mother; Hyperlipidemia in her father; Prostate cancer in her  father. No Known Allergies    Review of Systems  Constitutional: Positive for fatigue. Negative for fever and chills.  HENT: Positive for congestion, sore throat and sinus pressure.   Respiratory: Positive for cough.        Objective:   Physical Exam  Constitutional: She appears well-developed and well-nourished.  HENT:  Right Ear: External ear normal.  Left Ear: External ear normal.  Mouth/Throat: Oropharynx is clear and moist.  Neck: Neck supple. No thyromegaly present.  Cardiovascular: Normal rate and regular rhythm.   No murmur heard. Pulmonary/Chest: Effort normal and breath sounds normal. No respiratory distress. She has no wheezes. She has no rales.  Lymphadenopathy:    She has no cervical adenopathy.          Assessment & Plan:  Acute sinusitis. Zithromax for 5 days. Refill Tessalon Perles. Followup when necessary

## 2013-04-16 NOTE — Patient Instructions (Addendum)

## 2013-05-09 ENCOUNTER — Other Ambulatory Visit: Payer: Self-pay

## 2013-05-09 MED ORDER — DILTIAZEM HCL ER BEADS 360 MG PO CP24: 360.0000 mg | ORAL_CAPSULE | Freq: Every day | ORAL | Status: DC

## 2013-06-14 ENCOUNTER — Ambulatory Visit (INDEPENDENT_AMBULATORY_CARE_PROVIDER_SITE_OTHER): Payer: Managed Care, Other (non HMO) | Admitting: Cardiology

## 2013-06-14 ENCOUNTER — Encounter: Payer: Self-pay | Admitting: Cardiology

## 2013-06-14 VITALS — BP 124/77 | HR 86 | Ht 64.0 in | Wt 173.0 lb

## 2013-06-14 DIAGNOSIS — I2510 Atherosclerotic heart disease of native coronary artery without angina pectoris: Secondary | ICD-10-CM

## 2013-06-14 DIAGNOSIS — E7800 Pure hypercholesterolemia, unspecified: Secondary | ICD-10-CM

## 2013-06-14 DIAGNOSIS — I201 Angina pectoris with documented spasm: Secondary | ICD-10-CM

## 2013-06-14 DIAGNOSIS — R079 Chest pain, unspecified: Secondary | ICD-10-CM

## 2013-06-14 DIAGNOSIS — Z720 Tobacco use: Secondary | ICD-10-CM

## 2013-06-14 DIAGNOSIS — F17200 Nicotine dependence, unspecified, uncomplicated: Secondary | ICD-10-CM

## 2013-06-14 NOTE — Progress Notes (Signed)
[name] Date of Birth:  June 03, 1961 [address] [place], [place]  [address] [phone]  Fax   [phone]  HPI: This pleasant 52year-old woman is seen for a followup office visit. She has a history of Prinzmetal angina. 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 She sits at a desk but is up and about the office intermittently during the day. She has not been experiencing any calf pain to suggest deep vein thrombosis.  The patient has occasional substernal chest pressure relieved by nitroglycerin.  Today she had to such episodes each of which responded to a single sublingual nitroglycerin and occurred while she was working at her computer.   Current Outpatient Prescriptions  Medication Sig Dispense Refill  . acetaminophen (TYLENOL) 500 MG tablet Take 500 mg by mouth every 6 (six) hours as needed. For pain      . aspirin 81 MG tablet Take 81 mg by mouth daily.        Marland Kitchen atorvastatin (LIPITOR) 20 MG tablet Take 1 tablet (20 mg total) by mouth daily.  30 tablet  11  . benzonatate (TESSALON) 200 MG capsule Take 1 capsule (200 mg total) by mouth 3 (three) times daily as needed for cough.  30 capsule  0  . diltiazem (TAZTIA XT) 360 MG 24 hr capsule Take 1 capsule (360 mg total) by mouth daily.  30 capsule  5  . furosemide (LASIX) 20 MG tablet Take 1 tab if needed      . isosorbide dinitrate (ISORDIL) 30 MG tablet Take 1 tablet (30 mg total) by mouth 3 (three) times daily.  90 tablet  11  . nitroGLYCERIN (NITROSTAT) 0.4 MG SL tablet Place 1 tablet (0.4 mg total) under the tongue every 5 (five) minutes as needed. For chest pain  25 tablet  prn  . pantoprazole (PROTONIX) 40 MG tablet Take 1 tablet (40 mg total) by mouth daily.  30 tablet  11  . ranolazine (RANEXA) 500 MG 12 hr tablet Take 1 tablet (500 mg total) by mouth 2 (two) times daily.  60 tablet  11  . [DISCONTINUED]  diltiazem (CARDIZEM CD) 360 MG 24 hr capsule Take 1 capsule (360 mg total) by mouth daily.  30 capsule  6   No current facility-administered medications for this visit.    No Known Allergies  Patient Active Problem List   Diagnosis Date Noted  . Closed fracture of right humerus with routine healing 02/06/2013  . Bronchitis 09/29/2011  . Constipation, acute 09/17/2011  . IBS (irritable bowel syndrome) 09/15/2011  . Nonspecific elevation of levels of transaminase or lactic acid dehydrogenase (LDH) 09/15/2011  . Coronary vasospasm   . Tachycardia 07/28/2011  . History of esophagitis 07/28/2011  . Chest pain 07/12/2011  . CAD (coronary artery disease) 05/28/2011  . Tobacco abuse 05/28/2011  . Abdominal pain, epigastric 12/04/2010  . Diarrhea 12/04/2010    History  Smoking status  . Former Smoker -- 0.25 packs/day for 30 years  . Types: Cigarettes  . Quit date: 05/12/2011  Smokeless tobacco  . Never Used    History  Alcohol Use No    Comment: prev 4-5 per week.  Currently not drinking (08/2011)    Family History  Problem Relation Age of Onset  . Breast cancer Mother 26  . Heart disease Mother     died CHF 79  .  Diabetes Mother   . Prostate cancer Father   . Hyperlipidemia Father   . Heart disease Father     s/p cabg - alive  . Colon cancer      First cousin on dads side     Review of Systems: The patient denies any heat or cold intolerance.  No weight gain or weight loss.  The patient denies headaches or blurry vision.  There is no cough or sputum production.  The patient denies dizziness.  There is no hematuria or hematochezia.  The patient denies any muscle aches or arthritis.  The patient denies any rash.  The patient denies frequent falling or instability.  There is no history of depression or anxiety.  All other systems were reviewed and are negative.   Physical Exam: Filed Vitals:   06/14/13 1636  BP: 124/77  Pulse: 86   the general appearance reveals a  well-developed well-nourished woman in no distress.The head and neck exam reveals pupils equal and reactive.  Extraocular movements are full.  There is no scleral icterus.  The mouth and pharynx are normal.  The neck is supple.  The carotids reveal no bruits.  The jugular venous pressure is normal.  The  thyroid is not enlarged.  There is no lymphadenopathy.  The chest is clear to percussion and auscultation.  There are no rales or rhonchi.  Expansion of the chest is symmetrical.  The precordium is quiet.  The first heart sound is normal.  The second heart sound is physiologically split.  There is no murmur gallop rub or click.  There is no abnormal lift or heave.  The abdomen is soft and nontender.  The bowel sounds are normal.  The liver and spleen are not enlarged.  There are no abdominal masses.  There are no abdominal bruits.  Extremities reveal good pedal pulses.  There is no phlebitis or edema.  There is no cyanosis or clubbing.  Strength is normal and symmetrical in all extremities.  There is no lateralizing weakness.  There are no sensory deficits.  The skin is warm and dry.  There is no rash.  The right upper arm is immobilized in a tight sling.  EKG shows normal sinus rhythm with first degree AV block and no ischemic changes.    Assessment / Plan:  Continue same medication.  Recheck in 4 months for followup office visit  Return next week for fasting lipid panel hepatic function panel and basal metabolic panel.

## 2013-06-14 NOTE — Assessment & Plan Note (Signed)
The patient has occasional episodes of chest discomfort relieved by sublingual nitroglycerin.  The chest discomfort is often occurring at rest consistent with Prinzmetal angina and not with exertion such as walking.

## 2013-06-14 NOTE — Patient Instructions (Signed)
Your physician recommends that you continue on your current medications as directed. Please refer to the Current Medication list given to you today.  Your physician wants you to follow-up in: 4 month ov You will receive a reminder letter in the mail two months in advance. If you don't receive a letter, please call our office to schedule the follow-up appointment.  Return for fasting labs

## 2013-06-14 NOTE — Assessment & Plan Note (Signed)
The patient states that she is no longer smoking cigarettes

## 2013-06-14 NOTE — Assessment & Plan Note (Signed)
The patient has been on long-term Lipitor 20 mg daily for her ischemic heart disease.  We will have her return soon for fasting lab work.. she is not having any side effects from the Lipitor at this point.

## 2013-06-22 ENCOUNTER — Other Ambulatory Visit (INDEPENDENT_AMBULATORY_CARE_PROVIDER_SITE_OTHER): Payer: Managed Care, Other (non HMO)

## 2013-06-22 DIAGNOSIS — E7800 Pure hypercholesterolemia, unspecified: Secondary | ICD-10-CM

## 2013-06-22 LAB — LIPID PANEL
HDL: 87.9 mg/dL (ref >39.00)
Total CHOL/HDL Ratio: 2
VLDL: 15 mg/dL (ref 0.0–40.0)

## 2013-06-22 LAB — BASIC METABOLIC PANEL
CO2: 28 mEq/L (ref 19–32)
Glucose, Bld: 99 mg/dL (ref 70–99)
Potassium: 3.5 mEq/L (ref 3.5–5.1)
Sodium: 139 mEq/L (ref 135–145)

## 2013-06-22 LAB — HEPATIC FUNCTION PANEL
Bilirubin, Direct: 0 mg/dL (ref 0.0–0.3)
Total Bilirubin: 0.6 mg/dL (ref 0.3–1.2)

## 2013-06-22 NOTE — Progress Notes (Signed)
Quick Note:  Please report to patient. The recent labs are stable. Continue same medication and careful diet. ______ 

## 2013-06-25 ENCOUNTER — Telehealth: Payer: Self-pay | Admitting: *Deleted

## 2013-06-25 NOTE — Telephone Encounter (Signed)
Mailed copy of labs and left message to call if any questions  

## 2013-06-25 NOTE — Telephone Encounter (Signed)
Message copied by Burnell Blanks on Mon Jun 25, 2013  4:08 PM ------      Message from: Cassell Clement      Created: Fri Jun 22, 2013  9:52 PM       Please report to patient.  The recent labs are stable. Continue same medication and careful diet. ------

## 2013-07-11 ENCOUNTER — Other Ambulatory Visit: Payer: Self-pay | Admitting: Orthopaedic Surgery

## 2013-07-11 DIAGNOSIS — M79601 Pain in right arm: Secondary | ICD-10-CM

## 2013-07-13 ENCOUNTER — Ambulatory Visit
Admission: RE | Admit: 2013-07-13 | Discharge: 2013-07-13 | Disposition: A | Discharge status: Home / Self Care | Payer: Managed Care, Other (non HMO) | Source: Ambulatory Visit | Attending: Orthopaedic Surgery | Admitting: Orthopaedic Surgery

## 2013-07-13 DIAGNOSIS — M79601 Pain in right arm: Secondary | ICD-10-CM

## 2013-07-16 ENCOUNTER — Encounter: Payer: Self-pay | Admitting: Family Medicine

## 2013-08-03 ENCOUNTER — Other Ambulatory Visit: Payer: Self-pay

## 2013-08-03 DIAGNOSIS — R079 Chest pain, unspecified: Secondary | ICD-10-CM

## 2013-08-03 MED ORDER — RANOLAZINE ER 500 MG PO TB12: 500.0000 mg | ORAL_TABLET | Freq: Two times a day (BID) | ORAL | Status: DC

## 2013-08-03 MED ORDER — ATORVASTATIN CALCIUM 20 MG PO TABS: 20.0000 mg | ORAL_TABLET | Freq: Every day | ORAL | Status: DC

## 2013-08-03 MED ORDER — PANTOPRAZOLE SODIUM 40 MG PO TBEC: 40.0000 mg | DELAYED_RELEASE_TABLET | Freq: Every day | ORAL | Status: DC

## 2013-09-06 ENCOUNTER — Other Ambulatory Visit: Payer: Self-pay

## 2013-09-06 DIAGNOSIS — Z1231 Encounter for screening mammogram for malignant neoplasm of breast: Secondary | ICD-10-CM

## 2013-09-25 ENCOUNTER — Ambulatory Visit
Admission: RE | Admit: 2013-09-25 | Discharge: 2013-09-25 | Disposition: A | Discharge status: Home / Self Care | Payer: Managed Care, Other (non HMO) | Source: Ambulatory Visit

## 2013-09-25 DIAGNOSIS — Z1231 Encounter for screening mammogram for malignant neoplasm of breast: Secondary | ICD-10-CM

## 2013-10-17 ENCOUNTER — Other Ambulatory Visit: Payer: Self-pay | Admitting: Cardiology

## 2013-10-18 ENCOUNTER — Other Ambulatory Visit: Payer: Self-pay

## 2013-10-18 DIAGNOSIS — R079 Chest pain, unspecified: Secondary | ICD-10-CM

## 2013-10-18 MED ORDER — ISOSORBIDE DINITRATE 30 MG PO TABS: 30.0000 mg | ORAL_TABLET | Freq: Three times a day (TID) | ORAL | Status: DC

## 2013-10-22 ENCOUNTER — Ambulatory Visit (INDEPENDENT_AMBULATORY_CARE_PROVIDER_SITE_OTHER): Payer: Managed Care, Other (non HMO) | Admitting: Cardiology

## 2013-10-22 ENCOUNTER — Encounter: Payer: Self-pay | Admitting: Cardiology

## 2013-10-22 VITALS — BP 126/77 | HR 82 | Ht 64.0 in | Wt 174.0 lb

## 2013-10-22 DIAGNOSIS — J40 Bronchitis, not specified as acute or chronic: Secondary | ICD-10-CM

## 2013-10-22 DIAGNOSIS — I201 Angina pectoris with documented spasm: Secondary | ICD-10-CM

## 2013-10-22 NOTE — Progress Notes (Signed)
Caroline [name] Date of Birth:  06-17-1961 [address] [place], [place]  [address] [phone]  Fax   [phone]  HPI: [name] is seen for a followup office visit. She has a history of Prinzmetal angina. 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 She sits at a desk but is up and about the office intermittently during the day. She has not been experiencing any calf pain to suggest deep vein thrombosis.  The patient has occasional substernal chest pressure relieved by nitroglycerin.  She has a history of a right shoulder injury and sometimes experiences bilateral shoulder pain radiating into the chest which masquerades as her chest pain.   Current Outpatient Prescriptions  Medication Sig Dispense Refill  . acetaminophen (TYLENOL) 500 MG tablet Take 500 mg by mouth every 6 (six) hours as needed. For pain      . aspirin 81 MG tablet Take 81 mg by mouth daily.        Marland Kitchen atorvastatin (LIPITOR) 20 MG tablet Take 1 tablet (20 mg total) by mouth daily.  30 tablet  6  . benzonatate (TESSALON) 200 MG capsule Take 1 capsule (200 mg total) by mouth 3 (three) times daily as needed for cough.  30 capsule  0  . diltiazem (TAZTIA XT) 360 MG 24 hr capsule Take 1 capsule (360 mg total) by mouth daily.  30 capsule  5  . furosemide (LASIX) 20 MG tablet Take 1 tab if needed      . isosorbide dinitrate (ISORDIL) 30 MG tablet Take 1 tablet (30 mg total) by mouth 3 (three) times daily.  90 tablet  6  . nitroGLYCERIN (NITROSTAT) 0.4 MG SL tablet Place 1 tablet (0.4 mg total) under the tongue every 5 (five) minutes as needed. For chest pain  25 tablet  prn  . pantoprazole (PROTONIX) 40 MG tablet Take 1 tablet (40 mg total) by mouth daily.  30 tablet  6  . ranolazine (RANEXA) 500 MG 12 hr tablet Take 1 tablet (500 mg total) by mouth 2 (two) times daily.  60 tablet  6  .  [DISCONTINUED] diltiazem (CARDIZEM CD) 360 MG 24 hr capsule Take 1 capsule (360 mg total) by mouth daily.  30 capsule  6   No current facility-administered medications for this visit.    No Known Allergies  Patient Active Problem List   Diagnosis Date Noted  . Closed fracture of right humerus with routine healing 02/06/2013  . Bronchitis 09/29/2011  . Constipation, acute 09/17/2011  . IBS (irritable bowel syndrome) 09/15/2011  . Nonspecific elevation of levels of transaminase or lactic acid dehydrogenase (LDH) 09/15/2011  . Coronary vasospasm   . Tachycardia 07/28/2011  . History of esophagitis 07/28/2011  . Chest pain 07/12/2011  . CAD (coronary artery disease) 05/28/2011  . Tobacco abuse 05/28/2011  . Abdominal pain, epigastric 12/04/2010  . Diarrhea 12/04/2010    History  Smoking status  . Former Smoker -- 0.25 packs/day for 30 years  . Types: Cigarettes  . Quit date: 05/12/2011  Smokeless tobacco  . Never Used    History  Alcohol Use No    Comment: prev 4-5 per week.  Currently not drinking (08/2011)    Family History  Problem Relation Age of Onset  . Breast cancer Mother 22  . Heart disease Mother     died CHF  85  . Diabetes Mother   . Prostate cancer Father   . Hyperlipidemia Father   . Heart disease Father     s/p cabg - alive  . Colon cancer      First cousin on dads side     Review of Systems: The patient denies any heat or cold intolerance.  No weight gain or weight loss.  The patient denies headaches or blurry vision.  There is no cough or sputum production.  The patient denies dizziness.  There is no hematuria or hematochezia.  The patient denies any muscle aches or arthritis.  The patient denies any rash.  The patient denies frequent falling or instability.  There is no history of depression or anxiety.  All other systems were reviewed and are negative.   Physical Exam: Filed Vitals:   10/22/13 1632  BP: 126/77  Pulse: 82   the general  appearance reveals a well-developed well-nourished woman in no distress.The head and neck exam reveals pupils equal and reactive.  Extraocular movements are full.  There is no scleral icterus.  The mouth and pharynx are normal.  The neck is supple.  The carotids reveal no bruits.  The jugular venous pressure is normal.  The  thyroid is not enlarged.  There is no lymphadenopathy.  The chest is clear to percussion and auscultation.  There are no rales or rhonchi.  Expansion of the chest is symmetrical.  The precordium is quiet.  The first heart sound is normal.  The second heart sound is physiologically split.  There is no murmur gallop rub or click.  There is no abnormal lift or heave.  The abdomen is soft and nontender.  The bowel sounds are normal.  The liver and spleen are not enlarged.  There are no abdominal masses.  There are no abdominal bruits.  Extremities reveal good pedal pulses.  There is no phlebitis or edema.  There is no cyanosis or clubbing.  Strength is normal and symmetrical in all extremities.  There is no lateralizing weakness.  There are no sensory deficits.  The skin is warm and dry.  There is no rash.  The right upper arm is immobilized in a tight sling.      Assessment / Plan:  Continue same medication.  Recheck in 4 months office visit and EKG.

## 2013-10-22 NOTE — Assessment & Plan Note (Signed)
Her coronary artery spasm is responsive to sublingual nitroglycerin.

## 2013-10-22 NOTE — Patient Instructions (Signed)
Your physician recommends that you continue on your current medications as directed. Please refer to the Current Medication list given to you today.  Your physician recommends that you schedule a follow-up appointment in: 4 month ov/ekg 

## 2013-10-22 NOTE — Assessment & Plan Note (Signed)
She is getting over a bout of sinusitis with bronchitis.  Her cough has improved

## 2013-11-10 ENCOUNTER — Other Ambulatory Visit: Payer: Self-pay | Admitting: Cardiology

## 2013-11-21 ENCOUNTER — Ambulatory Visit (INDEPENDENT_AMBULATORY_CARE_PROVIDER_SITE_OTHER): Payer: Managed Care, Other (non HMO) | Admitting: Family Medicine

## 2013-11-21 ENCOUNTER — Encounter: Payer: Self-pay | Admitting: Family Medicine

## 2013-11-21 VITALS — BP 120/72 | HR 80 | Temp 97.9°F | Wt 172.0 lb

## 2013-11-21 DIAGNOSIS — F321 Major depressive disorder, single episode, moderate: Secondary | ICD-10-CM

## 2013-11-21 MED ORDER — SERTRALINE HCL 50 MG PO TABS: 50.0000 mg | ORAL_TABLET | Freq: Every day | ORAL | Status: DC

## 2013-11-21 NOTE — Progress Notes (Signed)
   Subjective:    Patient ID: Caroline [name], female    DOB: 24-May-1961, 53 y.o.   MRN: [id number]  HPI Patient seen with increased stress issues. She has extremely stressful job has had some difficulties interacting with her boss. There's been some question of her having difficulties remembering details at work though she has not particularly noticed this. Her family has not noticed any consistent concerns. She's not having any difficulties focusing for the most part. She does feel somewhat depressed recently. Decreased mood. Early-morning awakening. Some feelings of hopelessness. Decreased energy. No suicidal ideation. No prior history of depression.  Past Medical History  Diagnosis Date  . GERD (gastroesophageal reflux disease)   . Hyperlipidemia   . Hx of cholecystectomy   . Hx of appendectomy   . CAD (coronary artery disease)     has documented coronary vasospasm but with underlying 40% prox LAD, 30% prox LCX and 70 to 80% proximal 1st DX and 30% RCA; recurrent vasospasm with subsequent cath in Jan 2013.   Marland Kitchen Palpitations     tachycardia on holter; treated with beta blockers  . Coronary vasospasm   . Esophagitis   . IBS (irritable bowel syndrome)    Past Surgical History  Procedure Laterality Date  . Cholecystectomy  1982  . Appendectomy  1976  . Breast surgery  2002    biopsy  . Cardiac catheterization  2011  . Upper gastrointestinal endoscopy  01/11/2011    esophagitis  . Cardiac catheterization  Sept 2012    With documented vasospasm but with residual disease; managed medically    reports that she quit smoking about 2 years ago. Her smoking use included Cigarettes. She has a 7.5 pack-year smoking history. She has never used smokeless tobacco. She reports that she does not drink alcohol or use illicit drugs. family history includes Breast cancer (age of onset: 77) in her mother; Colon cancer in an other family member; Diabetes in her mother; Heart disease in her father and  mother; Hyperlipidemia in her father; Prostate cancer in her father. No Known Allergies    Review of Systems  Constitutional: Positive for fatigue. Negative for appetite change and unexpected weight change.  Respiratory: Negative for cough and shortness of breath.   Cardiovascular: Negative for chest pain.  Psychiatric/Behavioral: Positive for sleep disturbance and dysphoric mood. Negative for suicidal ideas, confusion and agitation. The patient is nervous/anxious.        Objective:   Physical Exam  Constitutional: She is oriented to person, place, and time. She appears well-developed and well-nourished.  Cardiovascular: Normal rate and regular rhythm.   Pulmonary/Chest: Effort normal and breath sounds normal. No respiratory distress. She has no wheezes. She has no rales.  Neurological: She is alert and oriented to person, place, and time.  Psychiatric: Judgment and thought content normal.  Somewhat depressed mood          Assessment & Plan:   Maj. depressive episode. Patient has a score of 9 on PHQ-9. We discussed treatment options. We discussed counseling at this point she wishes to wait. Initiate sertraline 50 mg once daily. Reviewed possible side effects. Reassess one month.

## 2013-11-21 NOTE — Progress Notes (Signed)
Pre visit review using our clinic review tool, if applicable. No additional management support is needed unless otherwise documented below in the visit note. 

## 2013-11-23 ENCOUNTER — Telehealth: Payer: Self-pay

## 2013-11-23 NOTE — Telephone Encounter (Signed)
Pt called and said that sertraline (ZOLOFT) 50 MG tablet was making her sick , nausea, feels like bp is off

## 2013-11-23 NOTE — Telephone Encounter (Signed)
Pt informed

## 2013-11-23 NOTE — Telephone Encounter (Signed)
Try one half tablet daily for 3-4 days, then try to go up to one.  Nausea almost always goes away after a few days.

## 2014-01-21 ENCOUNTER — Emergency Department (HOSPITAL_BASED_OUTPATIENT_CLINIC_OR_DEPARTMENT_OTHER)
Admission: EM | Admit: 2014-01-21 | Discharge: 2014-01-22 | Disposition: A | Discharge status: Home / Self Care | Payer: Managed Care, Other (non HMO) | Attending: Emergency Medicine | Admitting: Emergency Medicine

## 2014-01-21 ENCOUNTER — Encounter (HOSPITAL_BASED_OUTPATIENT_CLINIC_OR_DEPARTMENT_OTHER): Payer: Self-pay | Admitting: Emergency Medicine

## 2014-01-21 DIAGNOSIS — Z7982 Long term (current) use of aspirin: Secondary | ICD-10-CM | POA: Insufficient documentation

## 2014-01-21 DIAGNOSIS — I2510 Atherosclerotic heart disease of native coronary artery without angina pectoris: Secondary | ICD-10-CM | POA: Insufficient documentation

## 2014-01-21 DIAGNOSIS — S01309A Unspecified open wound of unspecified ear, initial encounter: Secondary | ICD-10-CM | POA: Insufficient documentation

## 2014-01-21 DIAGNOSIS — Y929 Unspecified place or not applicable: Secondary | ICD-10-CM | POA: Insufficient documentation

## 2014-01-21 DIAGNOSIS — S01312A Laceration without foreign body of left ear, initial encounter: Secondary | ICD-10-CM

## 2014-01-21 DIAGNOSIS — Y9389 Activity, other specified: Secondary | ICD-10-CM | POA: Insufficient documentation

## 2014-01-21 DIAGNOSIS — Z9889 Other specified postprocedural states: Secondary | ICD-10-CM | POA: Insufficient documentation

## 2014-01-21 DIAGNOSIS — R296 Repeated falls: Secondary | ICD-10-CM | POA: Insufficient documentation

## 2014-01-21 DIAGNOSIS — Z79899 Other long term (current) drug therapy: Secondary | ICD-10-CM | POA: Insufficient documentation

## 2014-01-21 DIAGNOSIS — K219 Gastro-esophageal reflux disease without esophagitis: Secondary | ICD-10-CM | POA: Insufficient documentation

## 2014-01-21 DIAGNOSIS — F17200 Nicotine dependence, unspecified, uncomplicated: Secondary | ICD-10-CM | POA: Insufficient documentation

## 2014-01-21 DIAGNOSIS — E785 Hyperlipidemia, unspecified: Secondary | ICD-10-CM | POA: Insufficient documentation

## 2014-01-21 NOTE — ED Notes (Signed)
Pt reports fell in shower, unsure what she fell on but sustained a laceration to left ear.  No loc.

## 2014-01-21 NOTE — ED Provider Notes (Signed)
CSN: [id number]     Arrival date & time 01/21/14  2345 History  This chart was scribed for Wynetta Fines, MD by Ladene Artist, ED Scribe. The patient was seen in room [facility]. Patient's care was started at 11:53 PM.   Chief Complaint  Patient presents with  . Ear Laceration   The history is provided by the patient. No language interpreter was used.   HPI Comments: Caroline [name] is a 53 y.o. female who presents to the Emergency Department complaining of L ear laceration sustained approximately 1 hour ago. Pt states that she slipped and fell in the shower. Pt is not sure what she hit her ear on. She reports moderate ear pain, worse with palpation. She denies neck pain, back pain, extremity pain. Last tetanus within the last 5 years.   Past Medical History  Diagnosis Date  . GERD (gastroesophageal reflux disease)   . Hyperlipidemia   . Hx of cholecystectomy   . Hx of appendectomy   . CAD (coronary artery disease)     has documented coronary vasospasm but with underlying 40% prox LAD, 30% prox LCX and 70 to 80% proximal 1st DX and 30% RCA; recurrent vasospasm with subsequent cath in Jan 2013.   Marland Kitchen Palpitations     tachycardia on holter; treated with beta blockers  . Coronary vasospasm   . Esophagitis   . IBS (irritable bowel syndrome)    Past Surgical History  Procedure Laterality Date  . Cholecystectomy  1982  . Appendectomy  1976  . Breast surgery  2002    biopsy  . Cardiac catheterization  2011  . Upper gastrointestinal endoscopy  01/11/2011    esophagitis  . Cardiac catheterization  Sept 2012    With documented vasospasm but with residual disease; managed medically   Family History  Problem Relation Age of Onset  . Breast cancer Mother 28  . Heart disease Mother     died CHF 32  . Diabetes Mother   . Prostate cancer Father   . Hyperlipidemia Father   . Heart disease Father     s/p cabg - alive  . Colon cancer      First cousin on dads side    History  Substance Use  Topics  . Smoking status: Light Tobacco Smoker -- 0.25 packs/day for 30 years    Types: Cigarettes    Last Attempt to Quit: 05/12/2011  . Smokeless tobacco: Never Used  . Alcohol Use: 1.0 oz/week    2 drink(s) per week     Comment: occ   OB History   Grav Para Term Preterm Abortions TAB SAB Ect Mult Living                 Review of Systems  HENT: Positive for ear pain.   Musculoskeletal: Negative for back pain, myalgias and neck pain.  Skin: Positive for wound.  All other systems reviewed and are negative.  Allergies  Review of patient's allergies indicates no known allergies.  Home Medications   Prior to Admission medications   Medication Sig Start Date End Date Taking? Authorizing Provider  acetaminophen (TYLENOL) 500 MG tablet Take 500 mg by mouth every 6 (six) hours as needed. For pain    Historical Provider, MD  aspirin 81 MG tablet Take 81 mg by mouth daily.      Historical Provider, MD  atorvastatin (LIPITOR) 20 MG tablet Take 1 tablet (20 mg total) by mouth daily. 08/03/13 08/03/14  Darlin Coco, MD  furosemide (LASIX) 20 MG tablet Take 1 tab if needed    Historical Provider, MD  isosorbide dinitrate (ISORDIL) 30 MG tablet Take 1 tablet (30 mg total) by mouth 3 (three) times daily. 10/18/13   Darlin Coco, MD  nitroGLYCERIN (NITROSTAT) 0.4 MG SL tablet Place 1 tablet (0.4 mg total) under the tongue every 5 (five) minutes as needed. For chest pain 10/20/12   Darlin Coco, MD  pantoprazole (PROTONIX) 40 MG tablet Take 1 tablet (40 mg total) by mouth daily. 08/03/13   Darlin Coco, MD  ranolazine (RANEXA) 500 MG 12 hr tablet Take 1 tablet (500 mg total) by mouth 2 (two) times daily. 08/03/13 08/03/14  Darlin Coco, MD  sertraline (ZOLOFT) 50 MG tablet Take 1 tablet (50 mg total) by mouth daily. 11/21/13   Eulas Post, MD  TAZTIA XT 360 MG 24 hr capsule TAKE ONE CAPSULE BY MOUTH ONCE DAILY    Darlin Coco, MD   Triage Vitals: BP 129/77  Pulse 104   Temp(Src) 97.7 F (36.5 C) (Oral)  Resp 21  Ht 5\' 4"  (1.626 m)  Wt 158 lb (71.668 kg)  BMI 27.11 kg/m2  SpO2 96% Physical Exam  Nursing note and vitals reviewed. General: Well-developed, well-nourished female in no acute distress; appearance consistent with age of record HENT: normocephalic; full thickness laceration of the L ear from the lateral edge of the antitragus to the margin of the ear:  Eyes: pupils equal, round and reactive to light; extraocular muscles intact Neck: supple, nontender Heart: regular rate and rhythm Lungs: clear to auscultation bilaterally Abdomen: soft; nondistended; nontender; no masses or hepatosplenomegaly; bowel sounds present Extremities: No deformity; full range of motion; pulses normal Neurologic: Awake, alert and oriented; motor function intact in all extremities and symmetric; no facial droop Skin: Warm and dry Psychiatric: Normal mood and affect  ED Course  Procedures (including critical care time) DIAGNOSTIC STUDIES: Oxygen Saturation is 96% on RA, adequate by my interpretation.    COORDINATION OF CARE: 12:01 AM Discussed treatment plan with pt at bedside and pt agreed to plan.  MDM  Wound repaired by Michele Mcalpine, PA-C   Pressure dressing applied to prevent hematoma. Patient advised to keep it in place 48 hours. Sutures are to be removed in 5 days.  I personally performed the services described in this documentation, which was scribed in my presence. The recorded information has been reviewed and is accurate.    Wynetta Fines, MD 01/22/14 [phone]

## 2014-01-21 NOTE — ED Notes (Signed)
MD at bedside. 

## 2014-01-22 NOTE — ED Provider Notes (Signed)
Medical screening examination/treatment/procedure(s) were conducted as a shared visit with non-physician practitioner(s) and myself.  I personally evaluated the patient during the encounter.      Wynetta Fines, MD 01/22/14 [phone]

## 2014-01-22 NOTE — ED Provider Notes (Signed)
LACERATION REPAIR Performed by: Illene Labrador Authorized by: Illene Labrador Consent: Verbal consent obtained. Risks and benefits: risks, benefits and alternatives were discussed Consent given by: patient Patient identity confirmed: provided demographic data Prepped and Draped in normal sterile fashion Wound explored  Laceration Location: left ear  Laceration Length: 5 cm  No Foreign Bodies seen or palpated  Anesthesia: local infiltration  Local anesthetic: lidocaine 2% without epinephrine  Anesthetic total: 3 ml  Irrigation method: syringe Amount of cleaning: standard  Skin closure: 6-0 prolene  Number of sutures: 16  Technique: simple interrupted  Patient tolerance: Patient tolerated the procedure well with no immediate complications.   Illene Labrador, PA-C 01/22/14 [phone]

## 2014-01-23 ENCOUNTER — Encounter (HOSPITAL_BASED_OUTPATIENT_CLINIC_OR_DEPARTMENT_OTHER): Payer: Self-pay | Admitting: Emergency Medicine

## 2014-01-23 ENCOUNTER — Emergency Department (HOSPITAL_BASED_OUTPATIENT_CLINIC_OR_DEPARTMENT_OTHER)
Admission: EM | Admit: 2014-01-23 | Discharge: 2014-01-23 | Disposition: A | Discharge status: Home / Self Care | Payer: Managed Care, Other (non HMO) | Attending: Emergency Medicine | Admitting: Emergency Medicine

## 2014-01-23 ENCOUNTER — Emergency Department (HOSPITAL_BASED_OUTPATIENT_CLINIC_OR_DEPARTMENT_OTHER): Payer: Managed Care, Other (non HMO)

## 2014-01-23 DIAGNOSIS — F17200 Nicotine dependence, unspecified, uncomplicated: Secondary | ICD-10-CM | POA: Insufficient documentation

## 2014-01-23 DIAGNOSIS — Z9889 Other specified postprocedural states: Secondary | ICD-10-CM | POA: Insufficient documentation

## 2014-01-23 DIAGNOSIS — K209 Esophagitis, unspecified without bleeding: Secondary | ICD-10-CM | POA: Insufficient documentation

## 2014-01-23 DIAGNOSIS — R000 Tachycardia, unspecified: Secondary | ICD-10-CM | POA: Insufficient documentation

## 2014-01-23 DIAGNOSIS — Y929 Unspecified place or not applicable: Secondary | ICD-10-CM | POA: Insufficient documentation

## 2014-01-23 DIAGNOSIS — Z79899 Other long term (current) drug therapy: Secondary | ICD-10-CM | POA: Insufficient documentation

## 2014-01-23 DIAGNOSIS — Y93E1 Activity, personal bathing and showering: Secondary | ICD-10-CM | POA: Insufficient documentation

## 2014-01-23 DIAGNOSIS — Z7982 Long term (current) use of aspirin: Secondary | ICD-10-CM | POA: Insufficient documentation

## 2014-01-23 DIAGNOSIS — R296 Repeated falls: Secondary | ICD-10-CM | POA: Insufficient documentation

## 2014-01-23 DIAGNOSIS — K219 Gastro-esophageal reflux disease without esophagitis: Secondary | ICD-10-CM | POA: Insufficient documentation

## 2014-01-23 DIAGNOSIS — E785 Hyperlipidemia, unspecified: Secondary | ICD-10-CM | POA: Insufficient documentation

## 2014-01-23 DIAGNOSIS — I2510 Atherosclerotic heart disease of native coronary artery without angina pectoris: Secondary | ICD-10-CM | POA: Insufficient documentation

## 2014-01-23 DIAGNOSIS — S0990XA Unspecified injury of head, initial encounter: Secondary | ICD-10-CM

## 2014-01-23 MED ORDER — HYDROCODONE-ACETAMINOPHEN 5-325 MG PO TABS: 1.0000 | ORAL_TABLET | Freq: Four times a day (QID) | ORAL | Status: DC | PRN

## 2014-01-23 NOTE — Discharge Instructions (Signed)
Head Injury, Adult  You have received a head injury. It does not appear serious at this time. Headaches and vomiting are common following head injury. It should be easy to awaken from sleeping. Sometimes it is necessary for you to stay in the emergency department for a while for observation. Sometimes admission to the hospital may be needed. After injuries such as yours, most problems occur within the first 24 hours, but side effects may occur up to 7 10 days after the injury. It is important for you to carefully monitor your condition and contact your health care provider or seek immediate medical care if there is a change in your condition.  WHAT ARE THE TYPES OF HEAD INJURIES?  Head injuries can be as minor as a bump. Some head injuries can be more severe. More severe head injuries include:  · A jarring injury to the brain (concussion).  · A bruise of the brain (contusion). This mean there is bleeding in the brain that can cause swelling.  · A cracked skull (skull fracture).  · Bleeding in the brain that collects, clots, and forms a bump (hematoma).  WHAT CAUSES A HEAD INJURY?  A serious head injury is most likely to happen to someone who is in a car wreck and is not wearing a seat belt. Other causes of major head injuries include bicycle or motorcycle accidents, sports injuries, and falls.  HOW ARE HEAD INJURIES DIAGNOSED?  A complete history of the event leading to the injury and your current symptoms will be helpful in diagnosing head injuries. Many times, pictures of the brain, such as CT or MRI are needed to see the extent of the injury. Often, an overnight hospital stay is necessary for observation.   WHEN SHOULD I SEEK IMMEDIATE MEDICAL CARE?   You should get help right away if:  · You have confusion or drowsiness.  · You feel sick to your stomach (nauseous) or have continued, forceful vomiting.  · You have dizziness or unsteadiness that is getting worse.  · You have severe, continued headaches not  relieved by medicine. Only take over-the-counter or prescription medicines for pain, fever, or discomfort as directed by your health care provider.  · You do not have normal function of the arms or legs or are unable to walk.  · You notice changes in the black spots in the center of the colored part of your eye (pupil).  · You have a clear or bloody fluid coming from your nose or ears.  · You have a loss of vision.  During the next 24 hours after the injury, you must stay with someone who can watch you for the warning signs. This person should contact local emergency services (911 in the [place] if you have seizures, you become unconscious, or you are unable to wake up.  HOW CAN I PREVENT A HEAD INJURY IN THE FUTURE?  The most important factor for preventing major head injuries is avoiding motor vehicle accidents.  To minimize the potential for damage to your head, it is crucial to wear seat belts while riding in motor vehicles. Wearing helmets while bike riding and playing collision sports (like football) is also helpful. Also, avoiding dangerous activities around the house will further help reduce your risk of head injury.   WHEN CAN I RETURN TO NORMAL ACTIVITIES AND ATHLETICS?  You should be reevaluated by your health care provider before returning to these activities. If you have any of the following symptoms, you should not return   to activities or contact sports until 1 week after the symptoms have stopped:  · Persistent headache.  · Dizziness or vertigo.  · Poor attention and concentration.  · Confusion.  · Memory problems.  · Nausea or vomiting.  · Fatigue or tire easily.  · Irritability.  · Intolerant of bright lights or loud noises.  · Anxiety or depression.  · Disturbed sleep.  MAKE SURE YOU:   · Understand these instructions.  · Will watch your condition.  · Will get help right away if you are not doing well or get worse.  Document Released: 08/16/2005 Document Revised: 06/06/2013 Document Reviewed:  04/23/2013  [employer]® Patient Information ©2014 [employer].

## 2014-01-23 NOTE — ED Provider Notes (Signed)
Medical screening examination/treatment/procedure(s) were performed by non-physician practitioner and as supervising physician I was immediately available for consultation/collaboration.   EKG Interpretation None        Blanchie Dessert, MD 01/23/14 2326

## 2014-01-23 NOTE — ED Provider Notes (Signed)
CSN: [id number]     Arrival date & time 01/23/14  1638 History   First MD Initiated Contact with Patient 01/23/14 1816     Chief Complaint  Patient presents with  . Head Injury     (Consider location/radiation/quality/duration/timing/severity/associated sxs/prior Treatment) HPI Comments: Pt states that after a fall out of the shower 2 days ago she has continued to have a horrible headache to the left posterior scalp. No loc with fall. Not on blood thinners. Pt states that she has had blurred vision in both eyes. Denies numbness or weakness. Has taken an friends hydrocodone with relief;pt was seen in the er the night of the fall to have the laceration repaired  The history is provided by the patient. No language interpreter was used.    Past Medical History  Diagnosis Date  . GERD (gastroesophageal reflux disease)   . Hyperlipidemia   . Hx of cholecystectomy   . Hx of appendectomy   . CAD (coronary artery disease)     has documented coronary vasospasm but with underlying 40% prox LAD, 30% prox LCX and 70 to 80% proximal 1st DX and 30% RCA; recurrent vasospasm with subsequent cath in Jan 2013.   Marland Kitchen Palpitations     tachycardia on holter; treated with beta blockers  . Coronary vasospasm   . Esophagitis   . IBS (irritable bowel syndrome)    Past Surgical History  Procedure Laterality Date  . Cholecystectomy  1982  . Appendectomy  1976  . Breast surgery  2002    biopsy  . Cardiac catheterization  2011  . Upper gastrointestinal endoscopy  01/11/2011    esophagitis  . Cardiac catheterization  Sept 2012    With documented vasospasm but with residual disease; managed medically   Family History  Problem Relation Age of Onset  . Breast cancer Mother 56  . Heart disease Mother     died CHF 24  . Diabetes Mother   . Prostate cancer Father   . Hyperlipidemia Father   . Heart disease Father     s/p cabg - alive  . Colon cancer      First cousin on dads side    History  Substance  Use Topics  . Smoking status: Light Tobacco Smoker -- 0.25 packs/day for 30 years    Types: Cigarettes    Last Attempt to Quit: 05/12/2011  . Smokeless tobacco: Never Used  . Alcohol Use: 1.0 oz/week    2 drink(s) per week     Comment: occ   OB History   Grav Para Term Preterm Abortions TAB SAB Ect Mult Living                 Review of Systems  Constitutional: Negative.   Respiratory: Negative.   Cardiovascular: Negative.       Allergies  Review of patient's allergies indicates no known allergies.  Home Medications   Prior to Admission medications   Medication Sig Start Date End Date Taking? Authorizing Provider  acetaminophen (TYLENOL) 500 MG tablet Take 500 mg by mouth every 6 (six) hours as needed. For pain    Historical Provider, MD  aspirin 81 MG tablet Take 81 mg by mouth daily.      Historical Provider, MD  atorvastatin (LIPITOR) 20 MG tablet Take 1 tablet (20 mg total) by mouth daily. 08/03/13 08/03/14  Darlin Coco, MD  furosemide (LASIX) 20 MG tablet Take 1 tab if needed    Historical Provider, MD  isosorbide dinitrate (ISORDIL)  30 MG tablet Take 1 tablet (30 mg total) by mouth 3 (three) times daily. 10/18/13   Darlin Coco, MD  nitroGLYCERIN (NITROSTAT) 0.4 MG SL tablet Place 1 tablet (0.4 mg total) under the tongue every 5 (five) minutes as needed. For chest pain 10/20/12   Darlin Coco, MD  pantoprazole (PROTONIX) 40 MG tablet Take 1 tablet (40 mg total) by mouth daily. 08/03/13   Darlin Coco, MD  ranolazine (RANEXA) 500 MG 12 hr tablet Take 1 tablet (500 mg total) by mouth 2 (two) times daily. 08/03/13 08/03/14  Darlin Coco, MD  sertraline (ZOLOFT) 50 MG tablet Take 1 tablet (50 mg total) by mouth daily. 11/21/13   Eulas Post, MD  TAZTIA XT 360 MG 24 hr capsule TAKE ONE CAPSULE BY MOUTH ONCE DAILY    Darlin Coco, MD   BP 115/65  Pulse 83  Temp(Src) 97.8 F (36.6 C) (Oral)  Resp 20  Ht 5\' 4"  (1.626 m)  Wt 158 lb (71.668 kg)  BMI  27.11 kg/m2  SpO2 96% Physical Exam  Vitals reviewed. Constitutional: She is oriented to person, place, and time. She appears well-developed and well-nourished.  HENT:  Head: Normocephalic and atraumatic.  Cardiovascular: Normal rate and regular rhythm.   Pulmonary/Chest: Effort normal and breath sounds normal.  Abdominal: Soft. Bowel sounds are normal. There is no tenderness.  Musculoskeletal: Normal range of motion.       Cervical back: Normal.  Neurological: She is alert and oriented to person, place, and time. Coordination normal.  Skin:  Well healing wound to the left ear    ED Course  Procedures (including critical care time) Labs Review Labs Reviewed - No data to display  Imaging Review Ct Head Wo Contrast  01/23/2014   CLINICAL DATA:  Posterior headache after falling 2 days ago.  EXAM: CT HEAD WITHOUT CONTRAST  TECHNIQUE: Contiguous axial images were obtained from the base of the skull through the vertex without intravenous contrast.  COMPARISON:  None.  FINDINGS: There is no evidence of acute intracranial hemorrhage, mass lesion, brain edema or extra-axial fluid collection. The ventricles and subarachnoid spaces are appropriately sized for age. There is no CT evidence of acute cortical infarction.  The visualized paranasal sinuses, mastoid air cells and middle ears are clear. The calvarium is intact.  IMPRESSION: Unremarkable noncontrast head CT. No acute intracranial or calvarial findings.   Electronically Signed   By: Camie Patience M.D.   On: 01/23/2014 19:37     EKG Interpretation None      MDM   Final diagnoses:  Head injury    Ct negative. Not having neuro deficits. Will treat with hydrocodone for pain    Glendell Docker, NP 01/23/14 2010

## 2014-01-23 NOTE — ED Notes (Signed)
Patient transported to CT 

## 2014-01-23 NOTE — ED Notes (Signed)
Coban and kling removed-xeroform gauze intact to left ear

## 2014-01-23 NOTE — ED Notes (Signed)
Pt states she was seen 2 days ago for fall out of shower-states she has cont'd to have a HA-pt has coban/kling wrap to head for sutures to left ear and states she was advised to leave in place x 48 hours

## 2014-02-05 ENCOUNTER — Other Ambulatory Visit: Payer: Self-pay

## 2014-02-05 DIAGNOSIS — R079 Chest pain, unspecified: Secondary | ICD-10-CM

## 2014-02-05 MED ORDER — NITROGLYCERIN 0.4 MG SL SUBL: 0.4000 mg | SUBLINGUAL_TABLET | SUBLINGUAL | Status: DC | PRN

## 2014-02-05 MED ORDER — DILTIAZEM HCL ER BEADS 360 MG PO CP24: ORAL_CAPSULE | ORAL | Status: DC

## 2014-02-07 ENCOUNTER — Other Ambulatory Visit: Payer: Self-pay | Admitting: Cardiology

## 2014-02-13 ENCOUNTER — Encounter: Payer: Self-pay | Admitting: Cardiology

## 2014-02-13 ENCOUNTER — Ambulatory Visit (INDEPENDENT_AMBULATORY_CARE_PROVIDER_SITE_OTHER): Payer: Managed Care, Other (non HMO) | Admitting: Cardiology

## 2014-02-13 VITALS — BP 132/75 | HR 81 | Ht 64.0 in | Wt 173.0 lb

## 2014-02-13 DIAGNOSIS — R1013 Epigastric pain: Secondary | ICD-10-CM

## 2014-02-13 DIAGNOSIS — E785 Hyperlipidemia, unspecified: Secondary | ICD-10-CM

## 2014-02-13 DIAGNOSIS — I2510 Atherosclerotic heart disease of native coronary artery without angina pectoris: Secondary | ICD-10-CM

## 2014-02-13 DIAGNOSIS — I201 Angina pectoris with documented spasm: Secondary | ICD-10-CM

## 2014-02-13 DIAGNOSIS — R079 Chest pain, unspecified: Secondary | ICD-10-CM

## 2014-02-13 DIAGNOSIS — E7800 Pure hypercholesterolemia, unspecified: Secondary | ICD-10-CM

## 2014-02-13 MED ORDER — DILTIAZEM HCL ER BEADS 360 MG PO CP24: 360.0000 mg | ORAL_CAPSULE | Freq: Every day | ORAL | Status: DC

## 2014-02-13 MED ORDER — FUROSEMIDE 20 MG PO TABS: 20.0000 mg | ORAL_TABLET | Freq: Every day | ORAL | Status: DC

## 2014-02-13 MED ORDER — ISOSORBIDE DINITRATE 30 MG PO TABS: 30.0000 mg | ORAL_TABLET | Freq: Three times a day (TID) | ORAL | Status: DC

## 2014-02-13 MED ORDER — RANOLAZINE ER 500 MG PO TB12: 500.0000 mg | ORAL_TABLET | Freq: Two times a day (BID) | ORAL | Status: DC

## 2014-02-13 MED ORDER — ATORVASTATIN CALCIUM 20 MG PO TABS: 20.0000 mg | ORAL_TABLET | Freq: Every day | ORAL | Status: DC

## 2014-02-13 MED ORDER — NITROGLYCERIN 0.4 MG SL SUBL: 0.4000 mg | SUBLINGUAL_TABLET | SUBLINGUAL | Status: DC | PRN

## 2014-02-13 MED ORDER — PANTOPRAZOLE SODIUM 40 MG PO TBEC: 40.0000 mg | DELAYED_RELEASE_TABLET | Freq: Every day | ORAL | Status: DC

## 2014-02-13 NOTE — Assessment & Plan Note (Signed)
The patient has a history of dyslipidemia.  She was on Lipitor 20 mg daily.  We will plan to recheck her lipids at her next office visit

## 2014-02-13 NOTE — Assessment & Plan Note (Signed)
She has some mild epigastric and left upper quadrant discomfort intermittently which is suggestive of splenic flexure syndrome and gas trapped in her colon.

## 2014-02-13 NOTE — Progress Notes (Signed)
Caroline [name] Date of Birth:  November 25, 1960 [place] [address] [place],   [id number] [phone]        Fax   [phone]   History of Present Illness: [name] is seen for a followup office visit. She has a history of Prinzmetal angina. 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 She sits at a desk but is up and about the office intermittently during the day. She has not been experiencing any calf pain to suggest deep vein thrombosis. The patient has occasional substernal chest pressure relieved by nitroglycerin. She has a history of a right shoulder injury and sometimes experiences bilateral shoulder pain radiating into the chest which masquerades as her chest pain.  She has some occasional left upper quadrant pain which she suspects may be GI in origin.  The discomfort seems to come after she has been sitting at her computer for long periods of time.  When she gets up and walks around, the discomfort eases up.  Current Outpatient Prescriptions  Medication Sig Dispense Refill  . acetaminophen (TYLENOL) 500 MG tablet Take 500 mg by mouth every 6 (six) hours as needed. For pain      . aspirin 81 MG tablet Take 81 mg by mouth daily.        Marland Kitchen atorvastatin (LIPITOR) 20 MG tablet Take 1 tablet (20 mg total) by mouth daily.  30 tablet  6  . diltiazem (TAZTIA XT) 360 MG 24 hr capsule Take 1 capsule (360 mg total) by mouth daily.  30 capsule  3  . furosemide (LASIX) 20 MG tablet Take 1 tablet (20 mg total) by mouth daily. Take 1 tab if needed  30 tablet  3  . HYDROcodone-acetaminophen (NORCO/VICODIN) 5-325 MG per tablet Take 1-2 tablets by mouth every 6 (six) hours as needed.  15 tablet  0  . isosorbide dinitrate (ISORDIL) 30 MG tablet Take 1 tablet (30 mg total) by mouth 3 (three) times daily.  90 tablet  6  . nitroGLYCERIN (NITROSTAT) 0.4 MG SL  tablet Place 1 tablet (0.4 mg total) under the tongue every 5 (five) minutes as needed for chest pain.  25 tablet  1  . pantoprazole (PROTONIX) 40 MG tablet Take 1 tablet (40 mg total) by mouth daily.  30 tablet  6  . ranolazine (RANEXA) 500 MG 12 hr tablet Take 1 tablet (500 mg total) by mouth 2 (two) times daily.  60 tablet  6  . sertraline (ZOLOFT) 50 MG tablet Take 1 tablet (50 mg total) by mouth daily.  30 tablet  11  . [DISCONTINUED] diltiazem (CARDIZEM CD) 360 MG 24 hr capsule Take 1 capsule (360 mg total) by mouth daily.  30 capsule  6   No current facility-administered medications for this visit.    No Known Allergies  Patient Active Problem List   Diagnosis Date Noted  . Closed fracture of right humerus with routine healing 02/06/2013  . Bronchitis 09/29/2011  . Constipation, acute 09/17/2011  . IBS (irritable bowel syndrome) 09/15/2011  . Nonspecific elevation of levels of transaminase or lactic acid dehydrogenase (LDH) 09/15/2011  . Coronary vasospasm   . Tachycardia 07/28/2011  . History of esophagitis 07/28/2011  . Chest pain 07/12/2011  . CAD (coronary artery disease) 05/28/2011  . Tobacco abuse 05/28/2011  . Abdominal pain, epigastric 12/04/2010  .  Diarrhea 12/04/2010    History  Smoking status  . Light Tobacco Smoker -- 0.25 packs/day for 30 years  . Types: Cigarettes  . Last Attempt to Quit: 05/12/2011  Smokeless tobacco  . Never Used    History  Alcohol Use  . 1.0 oz/week  . 2 drink(s) per week    Comment: occ    Family History  Problem Relation Age of Onset  . Breast cancer Mother 22  . Heart disease Mother     died CHF 55  . Diabetes Mother   . Prostate cancer Father   . Hyperlipidemia Father   . Heart disease Father     s/p cabg - alive  . Colon cancer      First cousin on dads side     Review of Systems: Constitutional: no fever chills diaphoresis or fatigue or change in weight.  Head and neck: no hearing loss, no epistaxis, no  photophobia or visual disturbance. Respiratory: No cough, shortness of breath or wheezing. Cardiovascular: No chest pain peripheral edema, palpitations. Gastrointestinal: No abdominal distention, no abdominal pain, no change in bowel habits hematochezia or melena. Genitourinary: No dysuria, no frequency, no urgency, no nocturia. Musculoskeletal:No arthralgias, no back pain, no gait disturbance or myalgias. Neurological: No dizziness, no headaches, no numbness, no seizures, no syncope, no weakness, no tremors. Hematologic: No lymphadenopathy, no easy bruising. Psychiatric: No confusion, no hallucinations, no sleep disturbance.    Physical Exam: Filed Vitals:   02/13/14 1614  BP: 132/75  Pulse: 81   The general appearance reveals a well-developed well-nourished woman in no distress.The head and neck exam reveals pupils equal and reactive.  Extraocular movements are full.  There is no scleral icterus.  The mouth and pharynx are normal.  The neck is supple.  The carotids reveal no bruits.  The jugular venous pressure is normal.  The  thyroid is not enlarged.  There is no lymphadenopathy.  The chest is clear to percussion and auscultation.  There are no rales or rhonchi.  Expansion of the chest is symmetrical.  The precordium is quiet.  The first heart sound is normal.  The second heart sound is physiologically split.  There is no murmur gallop rub or click.  There is no abnormal lift or heave.  The abdomen is soft and nontender.  The bowel sounds are normal.  The liver and spleen are not enlarged.  There are no abdominal masses.  There are no abdominal bruits.  Extremities reveal good pedal pulses.  There is no phlebitis or edema.  There is no cyanosis or clubbing.  Strength is normal and symmetrical in all extremities.  There is no lateralizing weakness.  There are no sensory deficits.  The skin is warm and dry.  There is no rash.  EKG shows normal sinus rhythm with first degree AV block and is  unchanged since 06/14/13  Assessment / Plan: 1.  Prinzmetal angina 2. Dyslipidemia 3. Dyspepsia 4. 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  Recheck in 4 months for office visit lipid panel hepatic function panel and basal metabolic panel.  Her weight is down 1 pound since last visit.  Continue to work on prudent diet and weight loss.

## 2014-02-13 NOTE — Patient Instructions (Signed)
Your physician recommends that you continue on your current medications as directed. Please refer to the Current Medication list given to you today.  I refilled all medications to [facility] on [address]; Lipitor,diltiazem,lasix,isordil, nitroglycerin,protonix, and ranexa   Your physician wants you to follow-up in: 4 months  With fasting labs.  You will receive a reminder letter in the mail two months in advance. If you don't receive a letter, please call our office to schedule the follow-up appointment @ [phone].

## 2014-02-13 NOTE — Assessment & Plan Note (Signed)
She has to take occasional sublingual nitroglycerin.  She is also on Isordil 30 mg 3 times a day and on Ranexa

## 2014-03-05 ENCOUNTER — Ambulatory Visit (INDEPENDENT_AMBULATORY_CARE_PROVIDER_SITE_OTHER): Payer: Managed Care, Other (non HMO) | Admitting: Physician Assistant

## 2014-03-05 ENCOUNTER — Encounter: Payer: Self-pay | Admitting: Physician Assistant

## 2014-03-05 ENCOUNTER — Ambulatory Visit (INDEPENDENT_AMBULATORY_CARE_PROVIDER_SITE_OTHER)
Admission: RE | Admit: 2014-03-05 | Discharge: 2014-03-05 | Disposition: A | Discharge status: Home / Self Care | Payer: Managed Care, Other (non HMO) | Source: Ambulatory Visit | Attending: Physician Assistant | Admitting: Physician Assistant

## 2014-03-05 VITALS — BP 120/80 | HR 78 | Temp 97.9°F | Resp 18 | Wt 170.0 lb

## 2014-03-05 DIAGNOSIS — R079 Chest pain, unspecified: Secondary | ICD-10-CM

## 2014-03-05 DIAGNOSIS — Z5189 Encounter for other specified aftercare: Secondary | ICD-10-CM

## 2014-03-05 DIAGNOSIS — R0781 Pleurodynia: Secondary | ICD-10-CM

## 2014-03-05 DIAGNOSIS — S01309A Unspecified open wound of unspecified ear, initial encounter: Secondary | ICD-10-CM

## 2014-03-05 DIAGNOSIS — S01312D Laceration without foreign body of left ear, subsequent encounter: Secondary | ICD-10-CM

## 2014-03-05 MED ORDER — MELOXICAM 7.5 MG PO TABS: 7.5000 mg | ORAL_TABLET | Freq: Every day | ORAL | Status: DC

## 2014-03-05 NOTE — Patient Instructions (Signed)
We will obtain an x-ray series of sure ribs on the left side to make sure that there is no injury.  Mobic one pill daily for pain relief.  If emergency symptoms discussed during visit developed, seek medical attention immediately.  Followup as needed, or for worsening or persistent symptoms despite treatment.      Costochondritis Costochondritis is a condition in which the tissue (cartilage) that connects your ribs with your breastbone (sternum) becomes irritated. It causes pain in the chest and rib area. It usually goes away on its own over time. HOME CARE  Avoid activities that wear you out.  Do not strain your ribs. Avoid activities that use your:  Chest.  Belly.  Side muscles.  Put ice on the area for the first 2 days after the pain starts.  Put ice in a plastic bag.  Place a towel between your skin and the bag.  Leave the ice on for 20 minutes, 2-3 times a day.  Only take medicine as told by your doctor. GET HELP IF:  You have redness or puffiness (swelling) in the rib area.  Your pain does not go away with rest or medicine. GET HELP RIGHT AWAY IF:   Your pain gets worse.  You are very uncomfortable.  You have trouble breathing.  You cough up blood.  You start sweating or throwing up (vomiting).  You have a fever or lasting symptoms for more than 2-3 days.  You have a fever and your symptoms suddenly get worse. MAKE SURE YOU:   Understand these instructions.  Will watch your condition.  Will get help right away if you are not doing well or get worse. Document Released: 02/02/2008 Document Revised: 04/18/2013 Document Reviewed: 03/20/2013 [facility] Patient Information 2015 [place], [place]. This information is not intended to replace advice given to you by your health care provider. Make sure you discuss any questions you have with your health care provider.

## 2014-03-05 NOTE — Progress Notes (Signed)
Subjective:    Patient ID: Caroline [name], female    DOB: 1960-12-04, 53 y.o.   MRN: [id number]  HPI Patient is a 53 y.o. female presenting for pain under breast, on rib cage, and also a split in ear.  States that the pain under her breast has been going on for about 6 months. She states that it started suddenly, without any preceding events to cause tenderness. She has never had shingles. She states that the course has been gradually worsening. She states that the pain is a dull ache most of the time, but frequently worse. She states that light touch has started to really bother the area and even her bra touching the area causes pain. She constantly adjusts her clothing and bra to try and relieve the pain. She has not noticed any rash or bruising or wounds. She has not noticed any swelling, lumps or excessive warmth in the area. She states that occassionally she has taken tylenol for it, which she believes has helped some. Her last mammogram was in April and was normal. Pt has a history of angina which is managed by Dr. Mare Ferrari cardiology, which is treated well with nitroglycerin. She states this pain is totally different from her angina symptoms, however she has even tried taking nitroglycerin to relieve this pain which was unsuccessful. She denies F/C/N/V/D/SOB/HA/Syncope.   Pt is also complaining of ear pain. Pt states that she split her ear open over memorial day weekend, for which she received stitches. She states that she has already had the stitches removed. She is only wishing to have the area checked to make sure it is healing appropriately.  Review of Systems As per HPI and are otherwise negative.   Past Medical History  Diagnosis Date  . GERD (gastroesophageal reflux disease)   . Hyperlipidemia   . Hx of cholecystectomy   . Hx of appendectomy   . CAD (coronary artery disease)     has documented coronary vasospasm but with underlying 40% prox LAD, 30% prox LCX and 70 to 80%  proximal 1st DX and 30% RCA; recurrent vasospasm with subsequent cath in Jan 2013.   Marland Kitchen Palpitations     tachycardia on holter; treated with beta blockers  . Coronary vasospasm   . Esophagitis   . IBS (irritable bowel syndrome)     History   Social History  . Marital Status: Married    Spouse Name: N/A    Number of Children: N/A  . Years of Education: N/A   Occupational History  . Not on file.   Social History Main Topics  . Smoking status: Light Tobacco Smoker -- 0.25 packs/day for 30 years    Types: Cigarettes    Last Attempt to Quit: 05/12/2011  . Smokeless tobacco: Never Used  . Alcohol Use: 1.0 oz/week    2 drink(s) per week     Comment: occ  . Drug Use: No  . Sexual Activity: Yes   Other Topics Concern  . Not on file   Social History Narrative   Caffeine drinks 2 daily     Past Surgical History  Procedure Laterality Date  . Cholecystectomy  1982  . Appendectomy  1976  . Breast surgery  2002    biopsy  . Cardiac catheterization  2011  . Upper gastrointestinal endoscopy  01/11/2011    esophagitis  . Cardiac catheterization  Sept 2012    With documented vasospasm but with residual disease; managed medically    Family History  Problem Relation Age of Onset  . Breast cancer Mother 37  . Heart disease Mother     died CHF 51  . Diabetes Mother   . Prostate cancer Father   . Hyperlipidemia Father   . Heart disease Father     s/p cabg - alive  . Colon cancer      First cousin on dads side     No Known Allergies  Current Outpatient Prescriptions on File Prior to Visit  Medication Sig Dispense Refill  . acetaminophen (TYLENOL) 500 MG tablet Take 500 mg by mouth every 6 (six) hours as needed. For pain      . aspirin 81 MG tablet Take 81 mg by mouth daily.        Marland Kitchen atorvastatin (LIPITOR) 20 MG tablet Take 1 tablet (20 mg total) by mouth daily.  30 tablet  6  . diltiazem (TAZTIA XT) 360 MG 24 hr capsule Take 1 capsule (360 mg total) by mouth daily.  30  capsule  3  . furosemide (LASIX) 20 MG tablet Take 1 tablet (20 mg total) by mouth daily. Take 1 tab if needed  30 tablet  3  . HYDROcodone-acetaminophen (NORCO/VICODIN) 5-325 MG per tablet Take 1-2 tablets by mouth every 6 (six) hours as needed.  15 tablet  0  . isosorbide dinitrate (ISORDIL) 30 MG tablet Take 1 tablet (30 mg total) by mouth 3 (three) times daily.  90 tablet  6  . nitroGLYCERIN (NITROSTAT) 0.4 MG SL tablet Place 1 tablet (0.4 mg total) under the tongue every 5 (five) minutes as needed for chest pain.  25 tablet  1  . pantoprazole (PROTONIX) 40 MG tablet Take 1 tablet (40 mg total) by mouth daily.  30 tablet  6  . ranolazine (RANEXA) 500 MG 12 hr tablet Take 1 tablet (500 mg total) by mouth 2 (two) times daily.  60 tablet  6  . sertraline (ZOLOFT) 50 MG tablet Take 1 tablet (50 mg total) by mouth daily.  30 tablet  11  . [DISCONTINUED] diltiazem (CARDIZEM CD) 360 MG 24 hr capsule Take 1 capsule (360 mg total) by mouth daily.  30 capsule  6   No current facility-administered medications on file prior to visit.    EXAM: BP 120/80  Pulse 78  Temp(Src) 97.9 F (36.6 C) (Oral)  Resp 18  Wt 170 lb (77.111 kg)     Objective:   Physical Exam  Nursing note and vitals reviewed. Constitutional: She is oriented to person, place, and time. She appears well-developed and well-nourished. No distress.  HENT:  Head: Normocephalic and atraumatic.  Eyes: Conjunctivae and EOM are normal. Pupils are equal, round, and reactive to light.  Neck: Normal range of motion.  Cardiovascular: Normal rate, regular rhythm, normal heart sounds and intact distal pulses.   Pulmonary/Chest: Effort normal and breath sounds normal. No respiratory distress. She has no wheezes. She has no rales. She exhibits tenderness.  Mild ttp around the lower ribs on the left.  Musculoskeletal: Normal range of motion.  Neurological: She is alert and oriented to person, place, and time.  Skin: Skin is warm and dry. No  rash noted. She is not diaphoretic. No erythema. No pallor.  Left Chest wall: There are no visual lesions, rashes, swelling, erythema to the left chest wall.  Left Ear:There is a healing laceration to the posterior left ear. This has closed well. There is no erythema, pain to palpation, swelling, or excessive warm, or fluctuance. There is  no drainage.  Psychiatric: She has a normal mood and affect. Her behavior is normal. Judgment and thought content normal.    Lab Results  Component Value Date   WBC 10.8* 11/03/2012   HGB 14.4 11/03/2012   HCT 42.6 11/03/2012   PLT 203 11/03/2012   GLUCOSE 99 06/22/2013   CHOL 165 06/22/2013   TRIG 75.0 06/22/2013   HDL 87.90 06/22/2013   LDLCALC 62 06/22/2013   ALT 24 06/22/2013   AST 22 06/22/2013   NA 139 06/22/2013   K 3.5 06/22/2013   CL 102 06/22/2013   CREATININE 0.8 06/22/2013   BUN 14 06/22/2013   CO2 28 06/22/2013   TSH 2.60 12/10/2010   INR 1.03 11/03/2012   HGBA1C 5.7* 05/13/2011       Assessment & Plan:  Taima was seen today for place under left breast/rib cage area and split left ear.  Diagnoses and associated orders for this visit:  Rib pain on left side Comments: due to chronicty will obtain left rib series. Will do trial of daily Rx anti-inlfamatory.  - DG Ribs Unilateral Left; Future - meloxicam (MOBIC) 7.5 MG tablet; Take 1 tablet (7.5 mg total) by mouth daily.  Laceration of ear, left, subsequent encounter Comments: Has already been repaired. Healing appropriately. No intervention needed at this time.    No obvious visible abnormality to the left chest wall. Will attempt NSAID therapy.   Return precautions provided, and patient handout on costochondritis.  Plan to follow up as needed, or for worsening or persistent symptoms despite treatment.  Patient Instructions  We will obtain an x-ray series of sure ribs on the left side to make sure that there is no injury.  Mobic one pill daily for pain relief.  If emergency  symptoms discussed during visit developed, seek medical attention immediately.  Followup as needed, or for worsening or persistent symptoms despite treatment.

## 2014-03-05 NOTE — Progress Notes (Signed)
Pre visit review using our clinic review tool, if applicable. No additional management support is needed unless otherwise documented below in the visit note. 

## 2014-03-06 ENCOUNTER — Telehealth: Payer: Self-pay | Admitting: Family Medicine

## 2014-03-06 NOTE — Telephone Encounter (Signed)
Pt saw mat yesterday and inquiring about chest xray she had after her appt. pls call

## 2014-03-06 NOTE — Telephone Encounter (Signed)
Called and spoke with pt and pt is aware.  See result note.  

## 2014-04-22 ENCOUNTER — Ambulatory Visit: Payer: Managed Care, Other (non HMO) | Admitting: Family Medicine

## 2014-04-22 ENCOUNTER — Ambulatory Visit (INDEPENDENT_AMBULATORY_CARE_PROVIDER_SITE_OTHER): Payer: Managed Care, Other (non HMO) | Admitting: Family Medicine

## 2014-04-22 ENCOUNTER — Encounter: Payer: Self-pay | Admitting: Family Medicine

## 2014-04-22 VITALS — BP 132/76 | HR 91 | Temp 98.1°F | Wt 175.0 lb

## 2014-04-22 DIAGNOSIS — R1013 Epigastric pain: Secondary | ICD-10-CM

## 2014-04-22 DIAGNOSIS — R110 Nausea: Secondary | ICD-10-CM

## 2014-04-22 DIAGNOSIS — R0789 Other chest pain: Secondary | ICD-10-CM

## 2014-04-22 DIAGNOSIS — R5381 Other malaise: Secondary | ICD-10-CM

## 2014-04-22 DIAGNOSIS — R071 Chest pain on breathing: Secondary | ICD-10-CM

## 2014-04-22 DIAGNOSIS — M2550 Pain in unspecified joint: Secondary | ICD-10-CM

## 2014-04-22 DIAGNOSIS — R233 Spontaneous ecchymoses: Secondary | ICD-10-CM

## 2014-04-22 DIAGNOSIS — R5383 Other fatigue: Secondary | ICD-10-CM

## 2014-04-22 LAB — CBC WITH DIFFERENTIAL/PLATELET
BASOS ABS: 0 10*3/uL (ref 0.0–0.1)
BASOS PCT: 0.3 % (ref 0.0–3.0)
EOS ABS: 0.4 10*3/uL (ref 0.0–0.7)
Eosinophils Relative: 4.5 % (ref 0.0–5.0)
HCT: 44.8 % (ref 36.0–46.0)
Hemoglobin: 15 g/dL (ref 12.0–15.0)
LYMPHS PCT: 25.4 % (ref 12.0–46.0)
Lymphs Abs: 2.4 10*3/uL (ref 0.7–4.0)
MCHC: 33.6 g/dL (ref 30.0–36.0)
MCV: 97.5 fl (ref 78.0–100.0)
MONOS PCT: 6.8 % (ref 3.0–12.0)
Monocytes Absolute: 0.6 10*3/uL (ref 0.1–1.0)
NEUTROS PCT: 63 % (ref 43.0–77.0)
Neutro Abs: 6 10*3/uL (ref 1.4–7.7)
Platelets: 210 10*3/uL (ref 150.0–400.0)
RBC: 4.59 Mil/uL (ref 3.87–5.11)
RDW: 14.1 % (ref 11.5–15.5)
WBC: 9.5 10*3/uL (ref 4.0–10.5)

## 2014-04-22 LAB — BASIC METABOLIC PANEL
BUN: 20 mg/dL (ref 6–23)
CHLORIDE: 102 meq/L (ref 96–112)
CO2: 29 mEq/L (ref 19–32)
Calcium: 9.5 mg/dL (ref 8.4–10.5)
Creatinine, Ser: 0.9 mg/dL (ref 0.4–1.2)
GFR: 72.89 mL/min (ref >60.00)
Glucose, Bld: 92 mg/dL (ref 70–99)
POTASSIUM: 4.6 meq/L (ref 3.5–5.1)
Sodium: 141 mEq/L (ref 135–145)

## 2014-04-22 LAB — HEPATIC FUNCTION PANEL
ALT: 25 U/L (ref 0–35)
AST: 27 U/L (ref 0–37)
Albumin: 4.1 g/dL (ref 3.5–5.2)
Alkaline Phosphatase: 75 U/L (ref 39–117)
BILIRUBIN DIRECT: 0.1 mg/dL (ref 0.0–0.3)
BILIRUBIN TOTAL: 0.6 mg/dL (ref 0.2–1.2)
TOTAL PROTEIN: 7.9 g/dL (ref 6.0–8.3)

## 2014-04-22 LAB — SEDIMENTATION RATE: Sed Rate: 23 mm/hr — ABNORMAL HIGH (ref 0–22)

## 2014-04-22 LAB — LIPASE: Lipase: 23 U/L (ref 11.0–59.0)

## 2014-04-22 LAB — TSH: TSH: 1.35 u[IU]/mL (ref 0.35–4.50)

## 2014-04-22 NOTE — Progress Notes (Signed)
Subjective:    Patient ID: Caroline [name], female    DOB: 23-Oct-1960, 53 y.o.   MRN: [id number]  HPI Patient seen for the following issues:  One-week history of nausea without vomiting and decreased appetite and increasing general malaise. She's had some very poorly localized epigastric and left upper quadrant abdominal pain which radiates toward the back she states for almost a year. She's had previous cholecystectomy. Her pain is achy quality that radiates toward the thoracic region. Worse with pressure over that area such as tight fitting clothing. No associated rash. She had recent rib film which was unremarkable. Pain is 8/10 at its worst. She's taken Tylenol without relief. Denies any dyspnea or cough. No pleuritic pain. She's had previous colonoscopy 2011. No recent stool changes. Denies history of peptic ulcer disease. Decreased appetite but no recent weight loss.  Patient also complains of some increased arthralgias involving elbows, shoulders, knees, ankles. She has not seen objective evidence for joint swelling, erythema, or warmth. She's had some recent nonblanching petechial type lesions on her lower legs. No leg edema. No history of vasculitis  Past Medical History  Diagnosis Date  . GERD (gastroesophageal reflux disease)   . Hyperlipidemia   . Hx of cholecystectomy   . Hx of appendectomy   . CAD (coronary artery disease)     has documented coronary vasospasm but with underlying 40% prox LAD, 30% prox LCX and 70 to 80% proximal 1st DX and 30% RCA; recurrent vasospasm with subsequent cath in Jan 2013.   Marland Kitchen Palpitations     tachycardia on holter; treated with beta blockers  . Coronary vasospasm   . Esophagitis   . IBS (irritable bowel syndrome)    Past Surgical History  Procedure Laterality Date  . Cholecystectomy  1982  . Appendectomy  1976  . Breast surgery  2002    biopsy  . Cardiac catheterization  2011  . Upper gastrointestinal endoscopy  01/11/2011    esophagitis    . Cardiac catheterization  Sept 2012    With documented vasospasm but with residual disease; managed medically    reports that she has been smoking Cigarettes.  She has a 7.5 pack-year smoking history. She has never used smokeless tobacco. She reports that she drinks about one ounce of alcohol per week. She reports that she does not use illicit drugs. family history includes Breast cancer (age of onset: 91) in her mother; Colon cancer in an other family member; Diabetes in her mother; Heart disease in her father and mother; Hyperlipidemia in her father; Prostate cancer in her father. No Known Allergies    Review of Systems  Constitutional: Positive for appetite change and fatigue. Negative for fever, chills and unexpected weight change.  Respiratory: Negative for cough and shortness of breath.   Cardiovascular: Negative for chest pain, palpitations and leg swelling.  Gastrointestinal: Positive for nausea and abdominal pain. Negative for vomiting, blood in stool and abdominal distention.  Endocrine: Negative for polydipsia and polyuria.  Genitourinary: Negative for dysuria.  Musculoskeletal: Positive for arthralgias.  Skin: Positive for rash.  Hematological: Negative for adenopathy. Does not bruise/bleed easily.       Objective:   Physical Exam  Constitutional: She is oriented to person, place, and time. She appears well-developed and well-nourished.  HENT:  Mouth/Throat: Oropharynx is clear and moist.  Neck: Neck supple. No thyromegaly present.  Cardiovascular: Normal rate and regular rhythm.  Exam reveals no gallop.   Pulmonary/Chest: Effort normal and breath sounds normal. No respiratory  distress. She has no wheezes. She has no rales.  Abdominal: Soft. Bowel sounds are normal. She exhibits no distension and no mass. There is tenderness. There is no rebound and no guarding.  Mild tenderness epigastric and left upper quadrant to deep palpation. No organomegaly. No guarding or  rebound.  Musculoskeletal: She exhibits no edema.  Lymphadenopathy:    She has no cervical adenopathy.  Neurological: She is alert and oriented to person, place, and time.  Skin: Rash noted.  Ankles and lower legs bilaterally reveal nonblanching very fine petechial type rash. This is confined to lower legs only          Assessment & Plan:  #1 recent onset nausea without vomiting with some associated epigastric and left upper quadrant abdominal pain. She does not describe any melena or other evidence for acute bleed. No red flags such as weight loss though she has had decline in appetite. Obtain labs-CBC, hepatic, basic metabolic panel, sedimentation rate, lipase. Set up ultrasound to further evaluate as she's had some nondescript pains for almost a year.consider GI refer if symptoms persist. #2 chronic left chest wall and left thoracic pains. Question neuropathic. If labs and x-ray unremarkable, consider trial of low-dose gabapentin #3 diffuse symmetric polyarthralgias. Check further labs as above and also ANA and CCP antibodies

## 2014-04-22 NOTE — Patient Instructions (Signed)

## 2014-04-22 NOTE — Progress Notes (Signed)
Pre visit review using our clinic review tool, if applicable. No additional management support is needed unless otherwise documented below in the visit note. 

## 2014-04-23 LAB — CYCLIC CITRUL PEPTIDE ANTIBODY, IGG: Cyclic Citrullin Peptide Ab: <2 U/mL (ref 0.0–5.0)

## 2014-04-23 LAB — ANA: ANA: NEGATIVE

## 2014-06-10 ENCOUNTER — Other Ambulatory Visit (INDEPENDENT_AMBULATORY_CARE_PROVIDER_SITE_OTHER): Payer: Managed Care, Other (non HMO) | Admitting: *Deleted

## 2014-06-10 DIAGNOSIS — R1013 Epigastric pain: Secondary | ICD-10-CM

## 2014-06-10 DIAGNOSIS — E7800 Pure hypercholesterolemia, unspecified: Secondary | ICD-10-CM

## 2014-06-10 DIAGNOSIS — R079 Chest pain, unspecified: Secondary | ICD-10-CM

## 2014-06-10 DIAGNOSIS — I201 Angina pectoris with documented spasm: Secondary | ICD-10-CM

## 2014-06-10 LAB — HEPATIC FUNCTION PANEL
ALBUMIN: 3.6 g/dL (ref 3.5–5.2)
ALT: 25 U/L (ref 0–35)
AST: 27 U/L (ref 0–37)
Alkaline Phosphatase: 68 U/L (ref 39–117)
Bilirubin, Direct: 0.1 mg/dL (ref 0.0–0.3)
TOTAL PROTEIN: 7.5 g/dL (ref 6.0–8.3)
Total Bilirubin: 0.4 mg/dL (ref 0.2–1.2)

## 2014-06-10 LAB — BASIC METABOLIC PANEL
BUN: 12 mg/dL (ref 6–23)
CALCIUM: 8.8 mg/dL (ref 8.4–10.5)
CO2: 26 mEq/L (ref 19–32)
Chloride: 104 mEq/L (ref 96–112)
Creatinine, Ser: 0.8 mg/dL (ref 0.4–1.2)
GFR: 82.77 mL/min (ref >60.00)
Glucose, Bld: 88 mg/dL (ref 70–99)
Potassium: 3.9 mEq/L (ref 3.5–5.1)
Sodium: 140 mEq/L (ref 135–145)

## 2014-06-10 LAB — LIPID PANEL
Cholesterol: 169 mg/dL (ref 0–200)
HDL: 74.5 mg/dL (ref >39.00)
LDL Cholesterol: 80 mg/dL (ref 0–99)
NonHDL: 94.5
Total CHOL/HDL Ratio: 2
Triglycerides: 71 mg/dL (ref 0.0–149.0)
VLDL: 14.2 mg/dL (ref 0.0–40.0)

## 2014-06-10 NOTE — Progress Notes (Signed)
Quick Note:  Please make copy of labs for patient visit. ______ 

## 2014-06-13 ENCOUNTER — Ambulatory Visit (INDEPENDENT_AMBULATORY_CARE_PROVIDER_SITE_OTHER): Payer: Managed Care, Other (non HMO) | Admitting: Cardiology

## 2014-06-13 ENCOUNTER — Encounter: Payer: Self-pay | Admitting: Cardiology

## 2014-06-13 VITALS — BP 124/76 | HR 84 | Ht 64.0 in | Wt 180.0 lb

## 2014-06-13 DIAGNOSIS — R072 Precordial pain: Secondary | ICD-10-CM

## 2014-06-13 DIAGNOSIS — R079 Chest pain, unspecified: Secondary | ICD-10-CM

## 2014-06-13 DIAGNOSIS — I201 Angina pectoris with documented spasm: Secondary | ICD-10-CM

## 2014-06-13 DIAGNOSIS — Z720 Tobacco use: Secondary | ICD-10-CM

## 2014-06-13 DIAGNOSIS — R1013 Epigastric pain: Secondary | ICD-10-CM

## 2014-06-13 DIAGNOSIS — Z23 Encounter for immunization: Secondary | ICD-10-CM

## 2014-06-13 DIAGNOSIS — E785 Hyperlipidemia, unspecified: Secondary | ICD-10-CM

## 2014-06-13 DIAGNOSIS — E7800 Pure hypercholesterolemia, unspecified: Secondary | ICD-10-CM

## 2014-06-13 MED ORDER — ATORVASTATIN CALCIUM 20 MG PO TABS: 20.0000 mg | ORAL_TABLET | Freq: Every day | ORAL | Status: DC

## 2014-06-13 MED ORDER — PANTOPRAZOLE SODIUM 40 MG PO TBEC: 40.0000 mg | DELAYED_RELEASE_TABLET | Freq: Every day | ORAL | Status: DC

## 2014-06-13 MED ORDER — ISOSORBIDE DINITRATE 30 MG PO TABS: 30.0000 mg | ORAL_TABLET | Freq: Three times a day (TID) | ORAL | Status: DC

## 2014-06-13 MED ORDER — FUROSEMIDE 20 MG PO TABS: 20.0000 mg | ORAL_TABLET | Freq: Every day | ORAL | Status: DC

## 2014-06-13 MED ORDER — DILTIAZEM HCL ER BEADS 360 MG PO CP24: 360.0000 mg | ORAL_CAPSULE | Freq: Every day | ORAL | Status: DC

## 2014-06-13 MED ORDER — RANOLAZINE ER 500 MG PO TB12: 500.0000 mg | ORAL_TABLET | Freq: Two times a day (BID) | ORAL | Status: DC

## 2014-06-13 NOTE — Assessment & Plan Note (Signed)
She has occasional substernal chest discomfort attributable to her known Prinzmetal angina.  She estimates that she takes sublingual nitroglycerin about twice a week on average.

## 2014-06-13 NOTE — Assessment & Plan Note (Signed)
The patient is a former cigarette smoker.  She has quit smoking.

## 2014-06-13 NOTE — Assessment & Plan Note (Signed)
The patient is on atorvastatin for her hypercholesterolemia.  She has not had any myalgias.  She has not been as good about her diet and her weight is up 7 pounds since last visit.

## 2014-06-13 NOTE — Progress Notes (Signed)
Caroline [name] Date of Birth:  1961-01-01 [place] [address] [place], [place]  [address] [phone]        Fax   [phone]   History of Present Illness: [name] is seen for a followup office visit. She has a history of Prinzmetal angina. 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 She sits at a desk but is up and about the office intermittently during the day. She has not been experiencing any calf pain to suggest deep vein thrombosis. The patient has occasional substernal chest pressure relieved by nitroglycerin. She has a history of a right shoulder injury and sometimes experiences bilateral shoulder pain radiating into the chest which masquerades as her chest pain.  She has some occasional left upper quadrant pain which she suspects may be GI in origin.  The discomfort seems to come after she has been sitting at her computer for long periods of time.  When she gets up and walks around, the discomfort eases up.  Since last visit she has had problems with pulmonary and respiratory allergies  Current Outpatient Prescriptions  Medication Sig Dispense Refill  . acetaminophen (TYLENOL) 500 MG tablet Take 500 mg by mouth every 6 (six) hours as needed. For pain      . aspirin 81 MG tablet Take 81 mg by mouth daily.        Marland Kitchen atorvastatin (LIPITOR) 20 MG tablet Take 1 tablet (20 mg total) by mouth daily.  30 tablet  11  . diltiazem (TAZTIA XT) 360 MG 24 hr capsule Take 1 capsule (360 mg total) by mouth daily.  30 capsule  11  . furosemide (LASIX) 20 MG tablet Take 1 tablet (20 mg total) by mouth daily. Take 1 tab if needed  30 tablet  11  . HYDROcodone-acetaminophen (NORCO/VICODIN) 5-325 MG per tablet Take 1-2 tablets by mouth every 6 (six) hours as needed.  15 tablet  0  . isosorbide dinitrate (ISORDIL) 30 MG tablet Take 1 tablet (30 mg total) by  mouth 3 (three) times daily.  90 tablet  11  . nitroGLYCERIN (NITROSTAT) 0.4 MG SL tablet Place 1 tablet (0.4 mg total) under the tongue every 5 (five) minutes as needed for chest pain.  25 tablet  1  . pantoprazole (PROTONIX) 40 MG tablet Take 1 tablet (40 mg total) by mouth daily.  30 tablet  11  . ranolazine (RANEXA) 500 MG 12 hr tablet Take 1 tablet (500 mg total) by mouth 2 (two) times daily.  60 tablet  11  . sertraline (ZOLOFT) 50 MG tablet Take 1 tablet (50 mg total) by mouth daily.  30 tablet  11  . meloxicam (MOBIC) 7.5 MG tablet Take 1 tablet (7.5 mg total) by mouth daily.  30 tablet  1  . [DISCONTINUED] diltiazem (CARDIZEM CD) 360 MG 24 hr capsule Take 1 capsule (360 mg total) by mouth daily.  30 capsule  6   No current facility-administered medications for this visit.    No Known Allergies  Patient Active Problem List   Diagnosis Date Noted  . Dyslipidemia 02/13/2014  . Closed fracture of right humerus with routine healing 02/06/2013  . Bronchitis 09/29/2011  . Constipation, acute 09/17/2011  . IBS (irritable bowel syndrome) 09/15/2011  . Nonspecific elevation of levels of transaminase or lactic acid dehydrogenase (LDH) 09/15/2011  .  Coronary vasospasm   . Tachycardia 07/28/2011  . History of esophagitis 07/28/2011  . Chest pain 07/12/2011  . CAD (coronary artery disease) 05/28/2011  . Tobacco abuse 05/28/2011  . Abdominal pain, epigastric 12/04/2010  . Diarrhea 12/04/2010    History  Smoking status  . Light Tobacco Smoker -- 0.25 packs/day for 30 years  . Types: Cigarettes  . Last Attempt to Quit: 05/12/2011  Smokeless tobacco  . Never Used    History  Alcohol Use  . 1.0 oz/week  . 2 drink(s) per week    Comment: occ    Family History  Problem Relation Age of Onset  . Breast cancer Mother 63  . Heart disease Mother     died CHF 97  . Diabetes Mother   . Prostate cancer Father   . Hyperlipidemia Father   . Heart disease Father     s/p cabg - alive    . Colon cancer      First cousin on dads side     Review of Systems: Constitutional: no fever chills diaphoresis or fatigue or change in weight.  Head and neck: no hearing loss, no epistaxis, no photophobia or visual disturbance. Respiratory: No cough, shortness of breath or wheezing. Cardiovascular: No chest pain peripheral edema, palpitations. Gastrointestinal: No abdominal distention, no abdominal pain, no change in bowel habits hematochezia or melena. Genitourinary: No dysuria, no frequency, no urgency, no nocturia. Musculoskeletal:No arthralgias, no back pain, no gait disturbance or myalgias. Neurological: No dizziness, no headaches, no numbness, no seizures, no syncope, no weakness, no tremors. Hematologic: No lymphadenopathy, no easy bruising. Psychiatric: No confusion, no hallucinations, no sleep disturbance.    Physical Exam: Filed Vitals:   06/13/14 1639  BP: 124/76  Pulse: 84   The general appearance reveals a well-developed well-nourished woman in no distress.The head and neck exam reveals pupils equal and reactive.  Extraocular movements are full.  There is no scleral icterus.  The mouth and pharynx are normal.  The neck is supple.  The carotids reveal no bruits.  The jugular venous pressure is normal.  The  thyroid is not enlarged.  There is no lymphadenopathy.  The chest is clear to percussion and auscultation.  There are no rales or rhonchi.  Expansion of the chest is symmetrical.  The precordium is quiet.  The first heart sound is normal.  The second heart sound is physiologically split.  There is no murmur gallop rub or click.  There is no abnormal lift or heave.  The abdomen is soft and nontender.  The bowel sounds are normal.  The liver and spleen are not enlarged.  There are no abdominal masses.  There are no abdominal bruits.  Extremities reveal good pedal pulses.  There is no phlebitis or edema.  There is no cyanosis or clubbing.  Strength is normal and symmetrical in  all extremities.  There is no lateralizing weakness.  There are no sensory deficits.  The skin is warm and dry.  There is no rash.  EKG shows normal sinus rhythm with first degree AV block and is unchanged since 06/14/13  Assessment / Plan: 1.  Prinzmetal angina 2. Dyslipidemia 3. Dyspepsia 4. respiratory allergies 5. weight gain.  She had a normal TSH 2 months ago  Disposition:  The patient will continue same medication.  Recheck in 4 months for office visit and EKG.  Her weight is up 7 pounds  since last visit.  Continue to work on prudent diet and weight loss.

## 2014-06-13 NOTE — Patient Instructions (Signed)
Your physician recommends that you continue on your current medications as directed. Please refer to the Current Medication list given to you today.  Your physician wants you to follow-up in: 4 month ov/ekg  You will receive a reminder letter in the mail two months in advance. If you don't receive a letter, please call our office to schedule the follow-up appointment.  

## 2014-06-24 ENCOUNTER — Encounter: Payer: Self-pay | Admitting: Family Medicine

## 2014-06-24 ENCOUNTER — Ambulatory Visit (INDEPENDENT_AMBULATORY_CARE_PROVIDER_SITE_OTHER): Payer: Managed Care, Other (non HMO) | Admitting: Family Medicine

## 2014-06-24 VITALS — BP 128/70 | HR 105 | Temp 98.5°F | Wt 178.0 lb

## 2014-06-24 DIAGNOSIS — R062 Wheezing: Secondary | ICD-10-CM

## 2014-06-24 DIAGNOSIS — J320 Chronic maxillary sinusitis: Secondary | ICD-10-CM

## 2014-06-24 MED ORDER — AZITHROMYCIN 250 MG PO TABS: ORAL_TABLET | ORAL | Status: AC

## 2014-06-24 MED ORDER — PREDNISONE 10 MG PO TABS: ORAL_TABLET | ORAL | Status: DC

## 2014-06-24 NOTE — Patient Instructions (Signed)

## 2014-06-24 NOTE — Progress Notes (Signed)
   Subjective:    Patient ID: Caroline [name], female    DOB: Aug 02, 1961, 53 y.o.   MRN: [id number]  Cough Associated symptoms include chills and headaches. Pertinent negatives include no chest pain.   Patient is seen for acute visit. Onset a little over 1 week ago of body aches, cough, nasal congestion,headache, and right maxillary facial pain and pressure. Her husband had viral illness recently. She's had pressure localized over the right maxillary sinus and possibly some mild swelling. She had cough productive of thick yellow sputum. Saturday she had some chills and subjective fever but none since then. No nausea or vomiting. She is wheezing some off and on. Quit smoking about 3 years ago.  Past Medical History  Diagnosis Date  . GERD (gastroesophageal reflux disease)   . Hyperlipidemia   . Hx of cholecystectomy   . Hx of appendectomy   . CAD (coronary artery disease)     has documented coronary vasospasm but with underlying 40% prox LAD, 30% prox LCX and 70 to 80% proximal 1st DX and 30% RCA; recurrent vasospasm with subsequent cath in Jan 2013.   Marland Kitchen Palpitations     tachycardia on holter; treated with beta blockers  . Coronary vasospasm   . Esophagitis   . IBS (irritable bowel syndrome)    Past Surgical History  Procedure Laterality Date  . Cholecystectomy  1982  . Appendectomy  1976  . Breast surgery  2002    biopsy  . Cardiac catheterization  2011  . Upper gastrointestinal endoscopy  01/11/2011    esophagitis  . Cardiac catheterization  Sept 2012    With documented vasospasm but with residual disease; managed medically    reports that she has been smoking Cigarettes.  She has a 7.5 pack-year smoking history. She has never used smokeless tobacco. She reports that she drinks about one ounce of alcohol per week. She reports that she does not use illicit drugs. family history includes Breast cancer (age of onset: 9) in her mother; Colon cancer in an other family member; Diabetes in  her mother; Heart disease in her father and mother; Hyperlipidemia in her father; Prostate cancer in her father. No Known Allergies    Review of Systems  Constitutional: Positive for chills and fatigue.  HENT: Positive for congestion and sinus pressure.   Respiratory: Positive for cough.   Cardiovascular: Negative for chest pain.  Neurological: Positive for headaches.       Objective:   Physical Exam  Constitutional: She appears well-developed and well-nourished.  HENT:  Right Ear: External ear normal.  Left Ear: External ear normal.  Mouth/Throat: Oropharynx is clear and moist.  Neck: Neck supple.  Cardiovascular: Normal rate and regular rhythm.   Pulmonary/Chest: Effort normal. No respiratory distress. She has wheezes. She has no rales.  Lymphadenopathy:    She has no cervical adenopathy.          Assessment & Plan:  Acute upper respiratory illness. Probable acute right maxillary sinusitis. She has evidence for reactive airway disease with wheezing but no respiratory distress. Prednisone taper over the next week. Zithromax which she has tolerated well the past.

## 2014-06-24 NOTE — Progress Notes (Signed)
Pre visit review using our clinic review tool, if applicable. No additional management support is needed unless otherwise documented below in the visit note. 

## 2014-06-25 ENCOUNTER — Telehealth: Payer: Self-pay | Admitting: Family Medicine

## 2014-06-25 NOTE — Telephone Encounter (Signed)
emmi emailed °

## 2014-07-05 ENCOUNTER — Emergency Department (HOSPITAL_COMMUNITY)
Admission: EM | Admit: 2014-07-05 | Discharge: 2014-07-05 | Disposition: A | Discharge status: Home / Self Care | Payer: Managed Care, Other (non HMO) | Attending: Emergency Medicine | Admitting: Emergency Medicine

## 2014-07-05 ENCOUNTER — Emergency Department (HOSPITAL_COMMUNITY): Payer: Managed Care, Other (non HMO)

## 2014-07-05 ENCOUNTER — Emergency Department (HOSPITAL_BASED_OUTPATIENT_CLINIC_OR_DEPARTMENT_OTHER)
Admission: EM | Admit: 2014-07-05 | Discharge: 2014-07-05 | Discharge status: Against Medical Advice | Payer: Managed Care, Other (non HMO) | Attending: Emergency Medicine | Admitting: Emergency Medicine

## 2014-07-05 ENCOUNTER — Encounter (HOSPITAL_COMMUNITY): Payer: Self-pay | Admitting: Emergency Medicine

## 2014-07-05 DIAGNOSIS — R1013 Epigastric pain: Secondary | ICD-10-CM | POA: Insufficient documentation

## 2014-07-05 DIAGNOSIS — R1012 Left upper quadrant pain: Secondary | ICD-10-CM | POA: Insufficient documentation

## 2014-07-05 DIAGNOSIS — K219 Gastro-esophageal reflux disease without esophagitis: Secondary | ICD-10-CM | POA: Insufficient documentation

## 2014-07-05 DIAGNOSIS — R109 Unspecified abdominal pain: Secondary | ICD-10-CM

## 2014-07-05 DIAGNOSIS — Z791 Long term (current) use of non-steroidal anti-inflammatories (NSAID): Secondary | ICD-10-CM | POA: Diagnosis not present

## 2014-07-05 DIAGNOSIS — Z7982 Long term (current) use of aspirin: Secondary | ICD-10-CM | POA: Insufficient documentation

## 2014-07-05 DIAGNOSIS — E785 Hyperlipidemia, unspecified: Secondary | ICD-10-CM | POA: Diagnosis not present

## 2014-07-05 DIAGNOSIS — Z720 Tobacco use: Secondary | ICD-10-CM | POA: Diagnosis not present

## 2014-07-05 DIAGNOSIS — I2510 Atherosclerotic heart disease of native coronary artery without angina pectoris: Secondary | ICD-10-CM | POA: Diagnosis not present

## 2014-07-05 DIAGNOSIS — Z9889 Other specified postprocedural states: Secondary | ICD-10-CM | POA: Insufficient documentation

## 2014-07-05 DIAGNOSIS — R079 Chest pain, unspecified: Secondary | ICD-10-CM

## 2014-07-05 DIAGNOSIS — Z7952 Long term (current) use of systemic steroids: Secondary | ICD-10-CM | POA: Diagnosis not present

## 2014-07-05 DIAGNOSIS — Z79899 Other long term (current) drug therapy: Secondary | ICD-10-CM | POA: Insufficient documentation

## 2014-07-05 DIAGNOSIS — Z9089 Acquired absence of other organs: Secondary | ICD-10-CM | POA: Diagnosis not present

## 2014-07-05 LAB — CBC WITH DIFFERENTIAL/PLATELET
Basophils Absolute: 0 10*3/uL (ref 0.0–0.1)
Basophils Relative: 0 % (ref 0–1)
Eosinophils Absolute: 0.5 10*3/uL (ref 0.0–0.7)
Eosinophils Relative: 6 % — ABNORMAL HIGH (ref 0–5)
HCT: 42 % (ref 36.0–46.0)
HEMOGLOBIN: 14.6 g/dL (ref 12.0–15.0)
Lymphocytes Relative: 36 % (ref 12–46)
Lymphs Abs: 2.8 10*3/uL (ref 0.7–4.0)
MCH: 33.5 pg (ref 26.0–34.0)
MCHC: 34.8 g/dL (ref 30.0–36.0)
MCV: 96.3 fL (ref 78.0–100.0)
Monocytes Absolute: 0.7 10*3/uL (ref 0.1–1.0)
Monocytes Relative: 9 % (ref 3–12)
NEUTROS ABS: 3.7 10*3/uL (ref 1.7–7.7)
NEUTROS PCT: 48 % (ref 43–77)
Platelets: 213 10*3/uL (ref 150–400)
RBC: 4.36 MIL/uL (ref 3.87–5.11)
RDW: 13.7 % (ref 11.5–15.5)
WBC: 7.7 10*3/uL (ref 4.0–10.5)

## 2014-07-05 LAB — COMPREHENSIVE METABOLIC PANEL
ALBUMIN: 3.7 g/dL (ref 3.5–5.2)
ALK PHOS: 85 U/L (ref 39–117)
ALT: 25 U/L (ref 0–35)
ANION GAP: 16 — AB (ref 5–15)
AST: 24 U/L (ref 0–37)
BILIRUBIN TOTAL: 0.2 mg/dL — AB (ref 0.3–1.2)
BUN: 15 mg/dL (ref 6–23)
CHLORIDE: 101 meq/L (ref 96–112)
CO2: 24 mEq/L (ref 19–32)
Calcium: 9.1 mg/dL (ref 8.4–10.5)
Creatinine, Ser: 0.76 mg/dL (ref 0.50–1.10)
GFR calc Af Amer: >90 mL/min (ref >90)
GFR calc non Af Amer: >90 mL/min (ref >90)
Glucose, Bld: 129 mg/dL — ABNORMAL HIGH (ref 70–99)
POTASSIUM: 4.5 meq/L (ref 3.7–5.3)
Sodium: 141 mEq/L (ref 137–147)
Total Protein: 7.4 g/dL (ref 6.0–8.3)

## 2014-07-05 LAB — I-STAT TROPONIN, ED: Troponin i, poc: 0 ng/mL (ref 0.00–0.08)

## 2014-07-05 LAB — LIPASE, BLOOD: Lipase: 59 U/L (ref 11–59)

## 2014-07-05 MED ORDER — HYDROCODONE-ACETAMINOPHEN 5-325 MG PO TABS: 2.0000 | ORAL_TABLET | Freq: Four times a day (QID) | ORAL | Status: DC | PRN

## 2014-07-05 MED ORDER — ONDANSETRON HCL 4 MG PO TABS: 4.0000 mg | ORAL_TABLET | Freq: Four times a day (QID) | ORAL | Status: DC

## 2014-07-05 MED ORDER — MORPHINE SULFATE 4 MG/ML IJ SOLN
4.0000 mg | Freq: Once | INTRAMUSCULAR | Status: AC
  Administered 2014-07-05: 4 mg via INTRAVENOUS
  Filled 2014-07-05: qty 1

## 2014-07-05 MED ORDER — PANTOPRAZOLE SODIUM 40 MG IV SOLR
40.0000 mg | Freq: Once | INTRAVENOUS | Status: AC
  Administered 2014-07-05: 40 mg via INTRAVENOUS
  Filled 2014-07-05: qty 40

## 2014-07-05 NOTE — ED Notes (Signed)
Pt reports abdominal pain x 6 months

## 2014-07-05 NOTE — ED Provider Notes (Signed)
CSN: [id number]     Arrival date & time 07/05/14  0221 History   First MD Initiated Contact with Patient 07/05/14 0602     Chief Complaint  Patient presents with  . Abdominal Pain     (Consider location/radiation/quality/duration/timing/severity/associated sxs/prior Treatment) HPI Comments: Patient presents today with LUQ and epigastric abdominal pain.  She reports that the pain has been intermittent over the past six months.  Pain has been constant since yesterday afternoon.  Pain radiates to her back.  She describes the pain as a dull, burning pain.  She reports that the pain that she has today is similar to pain that she has had in the past.  She has taken Tylenol for the pain without relief and used a heating pad.  She reports associated nausea.  She denies vomiting, constipation, fever, chills, SOB, chest pain, dizziness, or diaphoresis.  She has seen her PCP for this pain several times in the past, but reports that she has never had a diagnosis.  She has a history of GERD, but states that this pain feels different.  She also reports a history of MI, but states that this pain feels different than that.  Past surgical history significant for Cholecystectomy and Appendectomy.    The history is provided by the patient.    Past Medical History  Diagnosis Date  . GERD (gastroesophageal reflux disease)   . Hyperlipidemia   . Hx of cholecystectomy   . Hx of appendectomy   . CAD (coronary artery disease)     has documented coronary vasospasm but with underlying 40% prox LAD, 30% prox LCX and 70 to 80% proximal 1st DX and 30% RCA; recurrent vasospasm with subsequent cath in Jan 2013.   Marland Kitchen Palpitations     tachycardia on holter; treated with beta blockers  . Coronary vasospasm   . Esophagitis   . IBS (irritable bowel syndrome)    Past Surgical History  Procedure Laterality Date  . Cholecystectomy  1982  . Appendectomy  1976  . Breast surgery  2002    biopsy  . Cardiac catheterization   2011  . Upper gastrointestinal endoscopy  01/11/2011    esophagitis  . Cardiac catheterization  Sept 2012    With documented vasospasm but with residual disease; managed medically   Family History  Problem Relation Age of Onset  . Breast cancer Mother 17  . Heart disease Mother     died CHF 72  . Diabetes Mother   . Prostate cancer Father   . Hyperlipidemia Father   . Heart disease Father     s/p cabg - alive  . Colon cancer      First cousin on dads side    History  Substance Use Topics  . Smoking status: Light Tobacco Smoker -- 0.25 packs/day for 30 years    Types: Cigarettes    Last Attempt to Quit: 05/12/2011  . Smokeless tobacco: Never Used  . Alcohol Use: 1.0 oz/week    2 drink(s) per week     Comment: occ   OB History    No data available     Review of Systems  All other systems reviewed and are negative.     Allergies  Review of patient's allergies indicates no known allergies.  Home Medications   Prior to Admission medications   Medication Sig Start Date End Date Taking? Authorizing Provider  acetaminophen (TYLENOL) 500 MG tablet Take 500 mg by mouth every 6 (six) hours as needed. For pain  Yes Historical Provider, MD  aspirin 81 MG tablet Take 81 mg by mouth daily.     Yes Historical Provider, MD  atorvastatin (LIPITOR) 20 MG tablet Take 1 tablet (20 mg total) by mouth daily. 06/13/14 06/13/15 Yes Darlin Coco, MD  diltiazem (TAZTIA XT) 360 MG 24 hr capsule Take 1 capsule (360 mg total) by mouth daily. 06/13/14  Yes Darlin Coco, MD  isosorbide dinitrate (ISORDIL) 30 MG tablet Take 1 tablet (30 mg total) by mouth 3 (three) times daily. 06/13/14  Yes Darlin Coco, MD  nitroGLYCERIN (NITROSTAT) 0.4 MG SL tablet Place 1 tablet (0.4 mg total) under the tongue every 5 (five) minutes as needed for chest pain. 02/13/14  Yes Darlin Coco, MD  ranolazine (RANEXA) 500 MG 12 hr tablet Take 1 tablet (500 mg total) by mouth 2 (two) times daily. 06/13/14  06/13/15 Yes Darlin Coco, MD  sertraline (ZOLOFT) 50 MG tablet Take 1 tablet (50 mg total) by mouth daily. 11/21/13  Yes Eulas Post, MD  furosemide (LASIX) 20 MG tablet Take 1 tablet (20 mg total) by mouth daily. Take 1 tab if needed 06/13/14   Darlin Coco, MD  HYDROcodone-acetaminophen (NORCO/VICODIN) 5-325 MG per tablet Take 1-2 tablets by mouth every 6 (six) hours as needed. 01/23/14   Glendell Docker, NP  meloxicam (MOBIC) 7.5 MG tablet Take 1 tablet (7.5 mg total) by mouth daily. 03/05/14   Zenaida Niece, PA-C  pantoprazole (PROTONIX) 40 MG tablet Take 1 tablet (40 mg total) by mouth daily. 06/13/14   Darlin Coco, MD  predniSONE (DELTASONE) 10 MG tablet Taper as follows: 4-4-4-3-3-2-2 06/24/14   Eulas Post, MD   BP 144/85 mmHg  Pulse 90  Temp(Src) 98.5 F (36.9 C) (Oral)  Resp 19  Ht 5\' 4"  (1.626 m)  Wt 158 lb (71.668 kg)  BMI 27.11 kg/m2  SpO2 96% Physical Exam  Constitutional: She appears well-developed and well-nourished.  HENT:  Head: Normocephalic and atraumatic.  Mouth/Throat: Oropharynx is clear and moist.  Neck: Normal range of motion. Neck supple.  Cardiovascular: Normal rate, regular rhythm and normal heart sounds.   Pulmonary/Chest: Effort normal and breath sounds normal.  Tenderness to palpation of the left lower chest  Abdominal: Soft. Bowel sounds are normal. She exhibits no distension and no mass. There is tenderness in the epigastric area and left upper quadrant. There is no rebound and no guarding.  Musculoskeletal: Normal range of motion.  Neurological: She is alert.  Skin: Skin is warm and dry.  Psychiatric: She has a normal mood and affect.  Nursing note and vitals reviewed.   ED Course  Procedures (including critical care time) Labs Review Labs Reviewed  CBC WITH DIFFERENTIAL - Abnormal; Notable for the following:    Eosinophils Relative 6 (*)    All other components within normal limits  COMPREHENSIVE METABOLIC PANEL -  Abnormal; Notable for the following:    Glucose, Bld 129 (*)    Total Bilirubin 0.2 (*)    Anion gap 16 (*)    All other components within normal limits  LIPASE, BLOOD  I-STAT TROPOININ, ED    Imaging Review Dg Chest 2 View  07/05/2014   CLINICAL DATA:  Left-sided chest pain  EXAM: CHEST  2 VIEW  COMPARISON:  03/05/2014  FINDINGS: Cardiac shadow is within normal limits. Minimal right basilar atelectasis is noted. This projects in the right middle lobe on the lateral projection. No bony abnormality is seen. No pneumothorax or effusion is noted.  IMPRESSION: Minimal  right basilar atelectasis.   Electronically Signed   By: Inez Catalina M.D.   On: 07/05/2014 07:31     EKG Interpretation   Date/Time:  Friday July 05 2014 07:39:45 EST Ventricular Rate:  85 PR Interval:  190 QRS Duration: 84 QT Interval:  375 QTC Calculation: 446 R Axis:   72 Text Interpretation:  Sinus rhythm Probable anteroseptal infarct, old No  significant change was found Confirmed by [facility]  MD, TREY ([id number]) on  07/05/2014 8:30:54 AM      9:00 AM Reassessed patient.  She reports that her pain is improved at this time.  Exam:  Abdomen soft with mild tenderness to palpation of the Epigastric and LUQ of the abdomen.  No rebound or guarding. MDM   Final diagnoses:  Left sided chest pain   Patient presents today with LUQ and epigastric abdominal pain radiating to the back.  This pain has been intermittent for six months.  She reports that the pain today is similar to pain that she has had in the past.  On exam, she does have LUQ and epigastric tenderness to palpation.  No rebound or guarding.  Patient afebrile.  Labs today unremarkable.  Past surgical history significant for Cholecystectomy and Appendectomy.  Do not feel that abdominal imaging is indicated at this time.  No ischemic changes on EKG.  Troponin negative.  CXR negative.  Pain improved in the ED.  Feel that the patient is stable for discharge.  Patient  given referral to GI.  Return precautions given.    Hyman Bible, PA-C 07/05/14 [place], MD 07/08/14 2328

## 2014-07-05 NOTE — ED Notes (Signed)
Pt presents with LUQ pain that has been intermittent for the past 6 months- pt admits to pain being significantly worse for the past 3 days.  Admits to intermittent nausea, denies vomiting, pt admits to some diarrhea.

## 2014-07-05 NOTE — ED Notes (Signed)
Pt not in room at this time; family at bedside 

## 2014-07-05 NOTE — ED Notes (Signed)
Pt has returned from being out of the department; pt placed on monitor; family at bedside

## 2014-08-06 ENCOUNTER — Encounter: Payer: Self-pay | Admitting: Family Medicine

## 2014-08-06 ENCOUNTER — Ambulatory Visit (INDEPENDENT_AMBULATORY_CARE_PROVIDER_SITE_OTHER): Payer: Managed Care, Other (non HMO) | Admitting: Family Medicine

## 2014-08-06 VITALS — BP 110/80 | Temp 97.9°F | Wt 180.0 lb

## 2014-08-06 DIAGNOSIS — J45909 Unspecified asthma, uncomplicated: Secondary | ICD-10-CM | POA: Insufficient documentation

## 2014-08-06 DIAGNOSIS — J4520 Mild intermittent asthma, uncomplicated: Secondary | ICD-10-CM

## 2014-08-06 MED ORDER — HYDROCODONE-HOMATROPINE 5-1.5 MG/5ML PO SYRP: 5.0000 mL | ORAL_SOLUTION | Freq: Three times a day (TID) | ORAL | Status: DC | PRN

## 2014-08-06 MED ORDER — PREDNISONE 20 MG PO TABS: ORAL_TABLET | ORAL | Status: DC

## 2014-08-06 NOTE — Patient Instructions (Signed)
Prednisone 20 mg............. 3 now then starting tomorrow morning 2 tablets daily until the cough subsides then taper as outlined  Hydromet.........Marland Kitchen 1/2-1 teaspoon at bedtime when necessary for cough

## 2014-08-06 NOTE — Progress Notes (Signed)
Pre visit review using our clinic review tool, if applicable. No additional management support is needed unless otherwise documented below in the visit note. 

## 2014-08-06 NOTE — Progress Notes (Signed)
   Subjective:    Patient ID: Caroline [name], female    DOB: 1961/01/24, 53 y.o.   MRN: [id number]  HPI elli is a 53 year old female,,,,,,, one pack-a-day smoker for 30 years,, quit a year and half ago,,,,, who comes in today with a cough for 6 weeks  She was seen here 6 weeks ago with a cough and given prednisone and a Z-Pak. The prednisone seemed to help somewhat but when she tapered off the cough returned. She's had no fever sputum production. She had a chest x-ray a couple months ago at the emergency room which was normal.   Review of Systems Review of systems otherwise negative    Objective:   Physical Exam  Well-developed well-nourished female no acute distress vital signs stable she's   afebrile. HEENT were negative neck was supple no adenopathy lungs are clear except for symmetrical mild late expiratory wheezing     Assessment & Plan:   asthma.......Marland Kitchen prednisone burst and taper slowly... Hydromet....... follow-up in 3 weeks with Dr. Elease Hashimoto

## 2014-08-08 ENCOUNTER — Encounter (HOSPITAL_COMMUNITY): Payer: Self-pay | Admitting: Cardiovascular Disease

## 2014-10-08 ENCOUNTER — Ambulatory Visit: Payer: Managed Care, Other (non HMO) | Admitting: Family Medicine

## 2014-10-30 ENCOUNTER — Ambulatory Visit: Payer: Managed Care, Other (non HMO) | Admitting: Cardiology

## 2014-11-05 ENCOUNTER — Other Ambulatory Visit: Payer: Self-pay

## 2014-11-05 DIAGNOSIS — R079 Chest pain, unspecified: Secondary | ICD-10-CM

## 2014-11-05 DIAGNOSIS — R1013 Epigastric pain: Secondary | ICD-10-CM

## 2014-11-05 DIAGNOSIS — E7800 Pure hypercholesterolemia, unspecified: Secondary | ICD-10-CM

## 2014-11-05 DIAGNOSIS — I201 Angina pectoris with documented spasm: Secondary | ICD-10-CM

## 2014-11-05 MED ORDER — RANOLAZINE ER 500 MG PO TB12: 500.0000 mg | ORAL_TABLET | Freq: Two times a day (BID) | ORAL | Status: DC

## 2014-11-14 ENCOUNTER — Other Ambulatory Visit: Payer: Self-pay

## 2014-11-14 ENCOUNTER — Ambulatory Visit (INDEPENDENT_AMBULATORY_CARE_PROVIDER_SITE_OTHER): Payer: Managed Care, Other (non HMO) | Admitting: Family Medicine

## 2014-11-14 ENCOUNTER — Encounter: Payer: Self-pay | Admitting: Family Medicine

## 2014-11-14 VITALS — BP 130/80 | HR 80 | Temp 98.4°F | Wt 183.0 lb

## 2014-11-14 DIAGNOSIS — R1013 Epigastric pain: Secondary | ICD-10-CM

## 2014-11-14 DIAGNOSIS — R221 Localized swelling, mass and lump, neck: Secondary | ICD-10-CM

## 2014-11-14 DIAGNOSIS — I201 Angina pectoris with documented spasm: Secondary | ICD-10-CM

## 2014-11-14 DIAGNOSIS — E7800 Pure hypercholesterolemia, unspecified: Secondary | ICD-10-CM

## 2014-11-14 DIAGNOSIS — R079 Chest pain, unspecified: Secondary | ICD-10-CM

## 2014-11-14 DIAGNOSIS — R0789 Other chest pain: Secondary | ICD-10-CM

## 2014-11-14 MED ORDER — DILTIAZEM HCL ER BEADS 360 MG PO CP24: 360.0000 mg | ORAL_CAPSULE | Freq: Every day | ORAL | Status: DC

## 2014-11-14 MED ORDER — GABAPENTIN 300 MG PO CAPS: 300.0000 mg | ORAL_CAPSULE | Freq: Two times a day (BID) | ORAL | Status: DC

## 2014-11-14 NOTE — Progress Notes (Signed)
Subjective:    Patient ID: Caroline [name], female    DOB: March 15, 1961, 54 y.o.   MRN: [id number]  HPI Patient seen today for the following:  She noted prominence around her sternal notch and right proximal clavicle region and lower neck about 2 weeks ago. She thinks has gotten larger over that time. She is not noted any adenopathy. She's not had any difficulty swallowing. No recent appetite or weight changes. No history of any thyroid masses. No history of recent injury.  Second issue is that she's had several month history of pain which radiates somewhat from her thoracic back area around underneath her left breast. She has never had any rash or history of shingles. She's had both rib and chest x-ray films which have been unremarkable. She's not any pleuritic pain. No cough. No dyspnea. She has what she describes a dull achy pain and occasionally burning quality. Tender to palpation. Lab work has been unremarkable. We suspected possible neuropathic pain. She has taken over-the-counter medications without much relief.  Past Medical History  Diagnosis Date  . GERD (gastroesophageal reflux disease)   . Hyperlipidemia   . Hx of cholecystectomy   . Hx of appendectomy   . CAD (coronary artery disease)     has documented coronary vasospasm but with underlying 40% prox LAD, 30% prox LCX and 70 to 80% proximal 1st DX and 30% RCA; recurrent vasospasm with subsequent cath in Jan 2013.   Marland Kitchen Palpitations     tachycardia on holter; treated with beta blockers  . Coronary vasospasm   . Esophagitis   . IBS (irritable bowel syndrome)    Past Surgical History  Procedure Laterality Date  . Cholecystectomy  1982  . Appendectomy  1976  . Breast surgery  2002    biopsy  . Cardiac catheterization  2011  . Upper gastrointestinal endoscopy  01/11/2011    esophagitis  . Cardiac catheterization  Sept 2012    With documented vasospasm but with residual disease; managed medically  . Left heart catheterization  with coronary angiogram N/A 09/14/2011    Procedure: LEFT HEART CATHETERIZATION WITH CORONARY ANGIOGRAM;  Surgeon: Burnell Blanks, MD;  Location: [facility] CATH LAB;  Service: Cardiovascular;  Laterality: N/A;    reports that she has been smoking Cigarettes.  She has a 7.5 pack-year smoking history. She has never used smokeless tobacco. She reports that she drinks about 1.0 oz of alcohol per week. She reports that she does not use illicit drugs. family history includes Breast cancer (age of onset: 68) in her mother; Colon cancer in an other family member; Diabetes in her mother; Heart disease in her father and mother; Hyperlipidemia in her father; Prostate cancer in her father. No Known Allergies    Review of Systems  Constitutional: Negative for fever, chills, appetite change and unexpected weight change.  HENT: Negative for trouble swallowing.   Respiratory: Negative for cough and shortness of breath.   Cardiovascular: Negative for palpitations and leg swelling.  Gastrointestinal: Negative for nausea and vomiting.  Hematological: Negative for adenopathy.       Objective:   Physical Exam  Constitutional: She appears well-developed and well-nourished. No distress.  Neck: Neck supple.  She has some prominence of her right proximal clavicle at the sternum junction. She has some slightly prominent soft tissue near the sternal notch but no distinct masses palpated. We cannot appreciate any anterior or posterior cervical adenopathy. No supraclavicular adenopathy noted.  Cardiovascular: Normal rate and regular rhythm.   Pulmonary/Chest:  Effort normal and breath sounds normal. No respiratory distress. She has no wheezes. She has no rales.  Abdominal: Soft. She exhibits no mass. There is no tenderness.  Lymphadenopathy:    She has no cervical adenopathy.  Skin: No rash noted.          Assessment & Plan:  #1 prominence lower neck. Suspect mostly bony prominence of her right proximal  clavicle. Obtain neck ultrasound to further evaluate. #2 chronic left thoracic/ left upper abdominal pain. She has not had any red flags such as appetite or weight changes. Labs have been unremarkable. Recent chest x-ray unremarkable. Suspect neuropathic. Trial of gabapentin 300 mg daily at bedtime sacral one week titrated 300 mg twice a day as needed. Touch base in 2-3 weeks if not improving.

## 2014-11-14 NOTE — Patient Instructions (Addendum)
We will set up neck ultrasound Let me know by next Tuesday if you have not heard anything. Start Gabapentin one at night for one week and then may titrate to one twice daily if needed.

## 2014-11-14 NOTE — Progress Notes (Signed)
Pre visit review using our clinic review tool, if applicable. No additional management support is needed unless otherwise documented below in the visit note. 

## 2014-11-18 ENCOUNTER — Ambulatory Visit
Admission: RE | Admit: 2014-11-18 | Discharge: 2014-11-18 | Disposition: A | Discharge status: Home / Self Care | Payer: Managed Care, Other (non HMO) | Source: Ambulatory Visit | Attending: Family Medicine | Admitting: Family Medicine

## 2014-11-18 DIAGNOSIS — R221 Localized swelling, mass and lump, neck: Secondary | ICD-10-CM

## 2014-11-21 ENCOUNTER — Telehealth: Payer: Self-pay | Admitting: Family Medicine

## 2014-11-21 NOTE — Telephone Encounter (Signed)
Called and left message for patient to return call.  

## 2014-11-21 NOTE — Telephone Encounter (Signed)
Pt req a call back about [place] results

## 2014-12-09 ENCOUNTER — Other Ambulatory Visit: Payer: Self-pay | Admitting: *Deleted

## 2014-12-09 DIAGNOSIS — R079 Chest pain, unspecified: Secondary | ICD-10-CM

## 2014-12-09 DIAGNOSIS — E7800 Pure hypercholesterolemia, unspecified: Secondary | ICD-10-CM

## 2014-12-09 DIAGNOSIS — R1013 Epigastric pain: Secondary | ICD-10-CM

## 2014-12-09 DIAGNOSIS — I201 Angina pectoris with documented spasm: Secondary | ICD-10-CM

## 2014-12-09 MED ORDER — PANTOPRAZOLE SODIUM 40 MG PO TBEC: 40.0000 mg | DELAYED_RELEASE_TABLET | Freq: Every day | ORAL | Status: DC

## 2014-12-09 MED ORDER — ATORVASTATIN CALCIUM 20 MG PO TABS: 20.0000 mg | ORAL_TABLET | Freq: Every day | ORAL | Status: DC

## 2014-12-26 ENCOUNTER — Ambulatory Visit (INDEPENDENT_AMBULATORY_CARE_PROVIDER_SITE_OTHER): Payer: Managed Care, Other (non HMO) | Admitting: Cardiology

## 2014-12-26 ENCOUNTER — Other Ambulatory Visit: Payer: Self-pay | Admitting: *Deleted

## 2014-12-26 ENCOUNTER — Encounter: Payer: Self-pay | Admitting: Cardiology

## 2014-12-26 VITALS — BP 118/80 | HR 92 | Ht 64.0 in | Wt 182.0 lb

## 2014-12-26 DIAGNOSIS — E7800 Pure hypercholesterolemia, unspecified: Secondary | ICD-10-CM

## 2014-12-26 DIAGNOSIS — I201 Angina pectoris with documented spasm: Secondary | ICD-10-CM

## 2014-12-26 DIAGNOSIS — R079 Chest pain, unspecified: Secondary | ICD-10-CM

## 2014-12-26 NOTE — Progress Notes (Signed)
Cardiology Office Note   Date:  12/26/2014   ID:  Caroline [name], DOB 14-Jan-1961, MRN [id number]  PCP:  Eulas Post, MD  Cardiologist:   Darlin Coco, MD   [name] chief complaint on file.     History of Present Illness: Caroline [name] is a 54 y.o. female who presents for a six-month follow-up office visit  This pleasant 54 year-old woman is seen for a followup office visit. She has a history of Prinzmetal angina. 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 She sits at a desk but is up and about the office intermittently during the day. She has not been experiencing any calf pain to suggest deep vein thrombosis. The patient has occasional substernal chest pressure relieved by nitroglycerin. She has a history of a right shoulder injury and sometimes experiences bilateral shoulder pain radiating into the chest which masquerades as her chest pain.  She has occasional transient numbness of the right hand.  She has had previous severe right shoulder injury.  Her right clavicle is prominent at the medial end and she had a recent thyroid ultrasound which did not show any significant abnormality of the thyroid.  Past Medical History  Diagnosis Date  . GERD (gastroesophageal reflux disease)   . Hyperlipidemia   . Hx of cholecystectomy   . Hx of appendectomy   . CAD (coronary artery disease)     has documented coronary vasospasm but with underlying 40% prox LAD, 30% prox LCX and 70 to 80% proximal 1st DX and 30% RCA; recurrent vasospasm with subsequent cath in Jan 2013.   Marland Kitchen Palpitations     tachycardia on holter; treated with beta blockers  . Coronary vasospasm   . Esophagitis   . IBS (irritable bowel syndrome)     Past Surgical History  Procedure Laterality Date  . Cholecystectomy  1982  . Appendectomy  1976  . Breast surgery  2002    biopsy  . Cardiac catheterization  2011  .  Upper gastrointestinal endoscopy  01/11/2011    esophagitis  . Cardiac catheterization  Sept 2012    With documented vasospasm but with residual disease; managed medically  . Left heart catheterization with coronary angiogram N/A 09/14/2011    Procedure: LEFT HEART CATHETERIZATION WITH CORONARY ANGIOGRAM;  Surgeon: Burnell Blanks, MD;  Location: [facility] CATH LAB;  Service: Cardiovascular;  Laterality: N/A;     Current Outpatient Prescriptions  Medication Sig Dispense Refill  . acetaminophen (TYLENOL) 500 MG tablet Take 500 mg by mouth every 6 (six) hours as needed. For pain    . aspirin 81 MG tablet Take 81 mg by mouth daily.      Marland Kitchen atorvastatin (LIPITOR) 20 MG tablet Take 1 tablet (20 mg total) by mouth daily. 90 tablet 0  . diltiazem (TAZTIA XT) 360 MG 24 hr capsule Take 1 capsule (360 mg total) by mouth daily. 90 capsule 0  . furosemide (LASIX) 20 MG tablet Take 1 tablet (20 mg total) by mouth daily. Take 1 tab if needed 30 tablet 11  . gabapentin (NEURONTIN) 300 MG capsule Take 1 capsule (300 mg total) by mouth 2 (two) times daily. 60 capsule 3  . isosorbide dinitrate (ISORDIL) 30 MG tablet Take 1 tablet (30 mg total) by mouth 3 (three) times daily. 90 tablet 11  . nitroGLYCERIN (NITROSTAT) 0.4 MG SL tablet Place 1 tablet (0.4 mg total)  under the tongue every 5 (five) minutes as needed for chest pain. 25 tablet 1  . pantoprazole (PROTONIX) 40 MG tablet Take 1 tablet (40 mg total) by mouth daily. 90 tablet 0  . ranolazine (RANEXA) 500 MG 12 hr tablet Take 1 tablet (500 mg total) by mouth 2 (two) times daily. 180 tablet 1  . sertraline (ZOLOFT) 50 MG tablet Take 1 tablet (50 mg total) by mouth daily. 30 tablet 11  . [DISCONTINUED] diltiazem (CARDIZEM CD) 360 MG 24 hr capsule Take 1 capsule (360 mg total) by mouth daily. 30 capsule 6   [name] current facility-administered medications for this visit.    Allergies:   Review of patient's allergies indicates [name] known allergies.    Social  History:  The patient  reports that she has been smoking Cigarettes.  She has a 7.5 pack-year smoking history. She has never used smokeless tobacco. She reports that she drinks about 1.0 oz of alcohol per week. She reports that she does not use illicit drugs.   Family History:  The patient's family history includes Breast cancer (age of onset: 68) in her mother; Colon cancer in an other family member; Diabetes in her mother; Heart disease in her father and mother; Hyperlipidemia in her father; Prostate cancer in her father.    ROS:  Please see the history of present illness.   Otherwise, review of systems are positive for none.   All other systems are reviewed and negative.    PHYSICAL EXAM: VS:  BP 118/80 mmHg  Pulse 92  Ht 5\' 4"  (1.626 m)  Wt 182 lb (82.555 kg)  BMI 31.22 kg/m2 , BMI Body mass index is 31.22 kg/(m^2). GEN: Well nourished, well developed, in [name] acute distress HEENT: normal Neck: [name] JVD, carotid bruits, or masses Cardiac: RRR; [name] murmurs, rubs, or gallops,[name] edema  Respiratory:  clear to auscultation bilaterally, normal work of breathing GI: soft, nontender, nondistended, + BS MS: [name] deformity or atrophy Skin: warm and dry, [name] rash Neuro:  Strength and sensation are intact Psych: euthymic mood, full affect   EKG:  EKG is ordered today. The ekg ordered today demonstrates normal sinus rhythm.  [name] ischemic changes.   Recent Labs: 04/22/2014: TSH 1.35 07/05/2014: ALT 25; BUN 15; Creatinine 0.76; Hemoglobin 14.6; Platelets 213; Potassium 4.5; Sodium 141    Lipid Panel    Component Value Date/Time   CHOL 169 06/10/2014 0746   TRIG 71.0 06/10/2014 0746   HDL 74.50 06/10/2014 0746   CHOLHDL 2 06/10/2014 0746   VLDL 14.2 06/10/2014 0746   LDLCALC 80 06/10/2014 0746      Wt Readings from Last 3 Encounters:  12/26/14 182 lb (82.555 kg)  11/14/14 183 lb (83.008 kg)  08/06/14 180 lb (81.647 kg)      Other studies Reviewed: Additional studies/ records that were  reviewed today include: . Review of the above records demonstrates:    ASSESSMENT AND PLAN:  1. Prinzmetal angina 2. Dyslipidemia 3. Dyspepsia 4. respiratory allergies 5.  Intermittent numbness of the right hand most likely secondary to previous right shoulder injury.  She has excellent radial pulses bilaterally  Disposition:  The patient will continue same medication. Recheck in 6 months for follow-up office visit.  By then she will of had her annual company physical and will be getting lab work at that time which she will bring with her to her appointment   Current medicines are reviewed at length with the patient today.  The patient does not  have concerns regarding medicines.  The following changes have been made:  [name] change  Labs/ tests ordered today include:   Orders Placed This Encounter  Procedures  . EKG 12-Lead       Signed, Darlin Coco, MD  12/26/2014 5:38 PM    [facility] [place], [place], [place]  [address] Phone: [phone]; Fax: [phone]

## 2014-12-26 NOTE — Patient Instructions (Signed)
Medication Instructions:  Your physician recommends that you continue on your current medications as directed. Please refer to the Current Medication list given to you today.  Labwork: none  Testing/Procedures: none  Follow-Up: Your physician wants you to follow-up in: mid November You will receive a reminder letter in the mail two months in advance. If you don't receive a letter, please call our office to schedule the follow-up appointment.

## 2015-01-02 ENCOUNTER — Ambulatory Visit (INDEPENDENT_AMBULATORY_CARE_PROVIDER_SITE_OTHER): Payer: Managed Care, Other (non HMO) | Admitting: Family Medicine

## 2015-01-02 ENCOUNTER — Encounter: Payer: Self-pay | Admitting: Family Medicine

## 2015-01-02 VITALS — BP 128/70 | HR 92 | Temp 98.0°F | Wt 182.0 lb

## 2015-01-02 DIAGNOSIS — J069 Acute upper respiratory infection, unspecified: Secondary | ICD-10-CM | POA: Diagnosis not present

## 2015-01-02 DIAGNOSIS — B9789 Other viral agents as the cause of diseases classified elsewhere: Principal | ICD-10-CM

## 2015-01-02 MED ORDER — ALBUTEROL SULFATE HFA 108 (90 BASE) MCG/ACT IN AERS: 2.0000 | INHALATION_SPRAY | RESPIRATORY_TRACT | Status: DC | PRN

## 2015-01-02 NOTE — Progress Notes (Signed)
   Subjective:    Patient ID: Caroline [name], female    DOB: [birth year], 54 y.o.   MRN: [id number]  HPI Acute visit. Patient seen with sore throat, left earache, dry cough, nasal congestion, bilateral maxillary sinus pressure, intermittent headaches. Duration of 3 days. Increase fatigue. Bodyaches. Denies any nausea or vomiting. No diarrhea.  Past Medical History  Diagnosis Date  . GERD (gastroesophageal reflux disease)   . Hyperlipidemia   . Hx of cholecystectomy   . Hx of appendectomy   . CAD (coronary artery disease)     has documented coronary vasospasm but with underlying 40% prox LAD, 30% prox LCX and 70 to 80% proximal 1st DX and 30% RCA; recurrent vasospasm with subsequent cath in Jan 2013.   Marland Kitchen Palpitations     tachycardia on holter; treated with beta blockers  . Coronary vasospasm   . Esophagitis   . IBS (irritable bowel syndrome)    Past Surgical History  Procedure Laterality Date  . Cholecystectomy  1982  . Appendectomy  1976  . Breast surgery  2002    biopsy  . Cardiac catheterization  2011  . Upper gastrointestinal endoscopy  01/11/2011    esophagitis  . Cardiac catheterization  Sept 2012    With documented vasospasm but with residual disease; managed medically  . Left heart catheterization with coronary angiogram N/A 09/14/2011    Procedure: LEFT HEART CATHETERIZATION WITH CORONARY ANGIOGRAM;  Surgeon: Burnell Blanks, MD;  Location: [facility] CATH LAB;  Service: Cardiovascular;  Laterality: N/A;    reports that she has been smoking Cigarettes.  She has a 7.5 pack-year smoking history. She has never used smokeless tobacco. She reports that she drinks about 1.0 oz of alcohol per week. She reports that she does not use illicit drugs. family history includes Breast cancer (age of onset: 2) in her mother; Colon cancer in an other family member; Diabetes in her mother; Heart disease in her father and mother; Hyperlipidemia in her father; Prostate cancer in her father. No  Known Allergies    Review of Systems  Constitutional: Positive for fatigue. Negative for fever and chills.  HENT: Positive for congestion and sore throat.   Respiratory: Positive for cough.   Cardiovascular: Negative for chest pain.  Neurological: Positive for headaches.       Objective:   Physical Exam  Constitutional: She appears well-developed and well-nourished.  HENT:  Right Ear: External ear normal.  Left Ear: External ear normal.  Mouth/Throat: Oropharynx is clear and moist.  Neck: Neck supple.  Cardiovascular: Normal rate and regular rhythm.   Pulmonary/Chest: Effort normal and breath sounds normal. No respiratory distress. She has no wheezes. She has no rales.  Lymphadenopathy:    She has no cervical adenopathy.          Assessment & Plan:  Viral URI with cough. Nonfocal exam. Treat symptomatically. She has tendencies towards wheezing off and on. Pro-air inhaler 2 puffs every 4 hours as needed for cough and wheeze

## 2015-01-02 NOTE — Progress Notes (Signed)
Pre visit review using our clinic review tool, if applicable. No additional management support is needed unless otherwise documented below in the visit note. 

## 2015-01-02 NOTE — Patient Instructions (Signed)
Upper Respiratory Infection, Adult An upper respiratory infection (URI) is also sometimes known as the common cold. The upper respiratory tract includes the nose, sinuses, throat, trachea, and bronchi. Bronchi are the airways leading to the lungs. Most people improve within 1 week, but symptoms can last up to 2 weeks. A residual cough may last even longer.  CAUSES Many different viruses can infect the tissues lining the upper respiratory tract. The tissues become irritated and inflamed and often become very moist. Mucus production is also common. A cold is contagious. You can easily spread the virus to others by oral contact. This includes kissing, sharing a glass, coughing, or sneezing. Touching your mouth or nose and then touching a surface, which is then touched by another person, can also spread the virus. SYMPTOMS  Symptoms typically develop 1 to 3 days after you come in contact with a cold virus. Symptoms vary from person to person. They may include:  Runny nose.  Sneezing.  Nasal congestion.  Sinus irritation.  Sore throat.  Loss of voice (laryngitis).  Cough.  Fatigue.  Muscle aches.  Loss of appetite.  Headache.  Low-grade fever. DIAGNOSIS  You might diagnose your own cold based on familiar symptoms, since most people get a cold 2 to 3 times a year. Your caregiver can confirm this based on your exam. Most importantly, your caregiver can check that your symptoms are not due to another disease such as strep throat, sinusitis, pneumonia, asthma, or epiglottitis. Blood tests, throat tests, and X-rays are not necessary to diagnose a common cold, but they may sometimes be helpful in excluding other more serious diseases. Your caregiver will decide if any further tests are required. RISKS AND COMPLICATIONS  You may be at risk for a more severe case of the common cold if you smoke cigarettes, have chronic heart disease (such as heart failure) or lung disease (such as asthma), or if  you have a weakened immune system. The very young and very old are also at risk for more serious infections. Bacterial sinusitis, middle ear infections, and bacterial pneumonia can complicate the common cold. The common cold can worsen asthma and chronic obstructive pulmonary disease (COPD). Sometimes, these complications can require emergency medical care and may be life-threatening. PREVENTION  The best way to protect against getting a cold is to practice good hygiene. Avoid oral or hand contact with people with cold symptoms. Wash your hands often if contact occurs. There is no clear evidence that vitamin C, vitamin E, echinacea, or exercise reduces the chance of developing a cold. However, it is always recommended to get plenty of rest and practice good nutrition. TREATMENT  Treatment is directed at relieving symptoms. There is no cure. Antibiotics are not effective, because the infection is caused by a virus, not by bacteria. Treatment may include:  Increased fluid intake. Sports drinks offer valuable electrolytes, sugars, and fluids.  Breathing heated mist or steam (vaporizer or shower).  Eating chicken soup or other clear broths, and maintaining good nutrition.  Getting plenty of rest.  Using gargles or lozenges for comfort.  Controlling fevers with ibuprofen or acetaminophen as directed by your caregiver.  Increasing usage of your inhaler if you have asthma. Zinc gel and zinc lozenges, taken in the first 24 hours of the common cold, can shorten the duration and lessen the severity of symptoms. Pain medicines may help with fever, muscle aches, and throat pain. A variety of non-prescription medicines are available to treat congestion and runny nose. Your caregiver   can make recommendations and may suggest nasal or lung inhalers for other symptoms.  HOME CARE INSTRUCTIONS   Only take over-the-counter or prescription medicines for pain, discomfort, or fever as directed by your  caregiver.  Use a warm mist humidifier or inhale steam from a shower to increase air moisture. This may keep secretions moist and make it easier to breathe.  Drink enough water and fluids to keep your urine clear or pale yellow.  Rest as needed.  Return to work when your temperature has returned to normal or as your caregiver advises. You may need to stay home longer to avoid infecting others. You can also use a face mask and careful hand washing to prevent spread of the virus. SEEK MEDICAL CARE IF:   After the first few days, you feel you are getting worse rather than better.  You need your caregiver's advice about medicines to control symptoms.  You develop chills, worsening shortness of breath, or brown or red sputum. These may be signs of pneumonia.  You develop yellow or brown nasal discharge or pain in the face, especially when you bend forward. These may be signs of sinusitis.  You develop a fever, swollen neck glands, pain with swallowing, or white areas in the back of your throat. These may be signs of strep throat. SEEK IMMEDIATE MEDICAL CARE IF:   You have a fever.  You develop severe or persistent headache, ear pain, sinus pain, or chest pain.  You develop wheezing, a prolonged cough, cough up blood, or have a change in your usual mucus (if you have chronic lung disease).  You develop sore muscles or a stiff neck. Document Released: 02/09/2001 Document Revised: 11/08/2011 Document Reviewed: 11/21/2013 Exit[employer] Patient Information 2015 [employer]. This information is not intended to replace advice given to you by your health care provider. Make sure you discuss any questions you have with your health care provider.  

## 2015-01-21 ENCOUNTER — Other Ambulatory Visit: Payer: Self-pay | Admitting: *Deleted

## 2015-01-21 MED ORDER — SERTRALINE HCL 50 MG PO TABS: 50.0000 mg | ORAL_TABLET | Freq: Every day | ORAL | Status: DC

## 2015-01-23 ENCOUNTER — Other Ambulatory Visit: Payer: Self-pay

## 2015-01-23 DIAGNOSIS — Z1231 Encounter for screening mammogram for malignant neoplasm of breast: Secondary | ICD-10-CM

## 2015-01-30 ENCOUNTER — Ambulatory Visit
Admission: RE | Admit: 2015-01-30 | Discharge: 2015-01-30 | Disposition: A | Discharge status: Home / Self Care | Payer: Managed Care, Other (non HMO) | Source: Ambulatory Visit

## 2015-01-30 DIAGNOSIS — Z1231 Encounter for screening mammogram for malignant neoplasm of breast: Secondary | ICD-10-CM

## 2015-01-31 ENCOUNTER — Other Ambulatory Visit: Payer: Self-pay | Admitting: Obstetrics

## 2015-01-31 DIAGNOSIS — R928 Other abnormal and inconclusive findings on diagnostic imaging of breast: Secondary | ICD-10-CM

## 2015-02-11 ENCOUNTER — Other Ambulatory Visit: Payer: Self-pay | Admitting: Cardiology

## 2015-02-25 ENCOUNTER — Other Ambulatory Visit: Payer: Managed Care, Other (non HMO)

## 2015-03-08 ENCOUNTER — Other Ambulatory Visit: Payer: Self-pay | Admitting: Cardiology

## 2015-03-21 ENCOUNTER — Encounter: Payer: Self-pay | Admitting: Physician Assistant

## 2015-04-03 ENCOUNTER — Other Ambulatory Visit: Payer: Self-pay | Admitting: Family Medicine

## 2015-04-03 DIAGNOSIS — R928 Other abnormal and inconclusive findings on diagnostic imaging of breast: Secondary | ICD-10-CM

## 2015-04-07 ENCOUNTER — Ambulatory Visit: Payer: Managed Care, Other (non HMO) | Admitting: Physician Assistant

## 2015-04-07 ENCOUNTER — Ambulatory Visit
Admission: RE | Admit: 2015-04-07 | Discharge: 2015-04-07 | Disposition: A | Discharge status: Home / Self Care | Payer: Managed Care, Other (non HMO) | Source: Ambulatory Visit | Attending: Family Medicine | Admitting: Family Medicine

## 2015-04-07 ENCOUNTER — Encounter: Payer: Self-pay | Admitting: Emergency Medicine

## 2015-04-07 DIAGNOSIS — R928 Other abnormal and inconclusive findings on diagnostic imaging of breast: Secondary | ICD-10-CM

## 2015-04-08 ENCOUNTER — Ambulatory Visit (INDEPENDENT_AMBULATORY_CARE_PROVIDER_SITE_OTHER)
Admission: RE | Admit: 2015-04-08 | Discharge: 2015-04-08 | Disposition: A | Discharge status: Home / Self Care | Payer: Managed Care, Other (non HMO) | Source: Ambulatory Visit | Attending: Physician Assistant | Admitting: Physician Assistant

## 2015-04-08 ENCOUNTER — Ambulatory Visit (INDEPENDENT_AMBULATORY_CARE_PROVIDER_SITE_OTHER): Payer: Managed Care, Other (non HMO) | Admitting: Physician Assistant

## 2015-04-08 ENCOUNTER — Other Ambulatory Visit (INDEPENDENT_AMBULATORY_CARE_PROVIDER_SITE_OTHER): Payer: Managed Care, Other (non HMO)

## 2015-04-08 ENCOUNTER — Encounter: Payer: Self-pay | Admitting: Physician Assistant

## 2015-04-08 VITALS — BP 124/72 | Ht 63.0 in | Wt 185.4 lb

## 2015-04-08 DIAGNOSIS — K625 Hemorrhage of anus and rectum: Secondary | ICD-10-CM

## 2015-04-08 DIAGNOSIS — R1013 Epigastric pain: Secondary | ICD-10-CM

## 2015-04-08 DIAGNOSIS — K219 Gastro-esophageal reflux disease without esophagitis: Secondary | ICD-10-CM | POA: Diagnosis not present

## 2015-04-08 DIAGNOSIS — R140 Abdominal distension (gaseous): Secondary | ICD-10-CM

## 2015-04-08 DIAGNOSIS — R0789 Other chest pain: Secondary | ICD-10-CM

## 2015-04-08 LAB — CBC WITH DIFFERENTIAL/PLATELET
BASOS ABS: 0 10*3/uL (ref 0.0–0.1)
Basophils Relative: 0.3 % (ref 0.0–3.0)
EOS ABS: 0.4 10*3/uL (ref 0.0–0.7)
EOS PCT: 6.1 % — AB (ref 0.0–5.0)
HCT: 43.7 % (ref 36.0–46.0)
Hemoglobin: 14.9 g/dL (ref 12.0–15.0)
Lymphocytes Relative: 24.6 % (ref 12.0–46.0)
Lymphs Abs: 1.6 10*3/uL (ref 0.7–4.0)
MCHC: 34 g/dL (ref 30.0–36.0)
MCV: 96.8 fl (ref 78.0–100.0)
Monocytes Absolute: 0.6 10*3/uL (ref 0.1–1.0)
Monocytes Relative: 9.6 % (ref 3.0–12.0)
Neutro Abs: 3.9 10*3/uL (ref 1.4–7.7)
Neutrophils Relative %: 59.4 % (ref 43.0–77.0)
PLATELETS: 253 10*3/uL (ref 150.0–400.0)
RBC: 4.52 Mil/uL (ref 3.87–5.11)
RDW: 14.9 % (ref 11.5–15.5)
WBC: 6.6 10*3/uL (ref 4.0–10.5)

## 2015-04-08 LAB — COMPREHENSIVE METABOLIC PANEL
ALK PHOS: 78 U/L (ref 39–117)
ALT: 32 U/L (ref 0–35)
AST: 24 U/L (ref 0–37)
Albumin: 4.3 g/dL (ref 3.5–5.2)
BUN: 14 mg/dL (ref 6–23)
CHLORIDE: 101 meq/L (ref 96–112)
CO2: 29 meq/L (ref 19–32)
Calcium: 9.5 mg/dL (ref 8.4–10.5)
Creatinine, Ser: 0.77 mg/dL (ref 0.40–1.20)
GFR: 82.52 mL/min (ref >60.00)
Glucose, Bld: 116 mg/dL — ABNORMAL HIGH (ref 70–99)
Potassium: 4.5 mEq/L (ref 3.5–5.1)
Sodium: 139 mEq/L (ref 135–145)
Total Bilirubin: 0.5 mg/dL (ref 0.2–1.2)
Total Protein: 7.5 g/dL (ref 6.0–8.3)

## 2015-04-08 LAB — LIPASE: Lipase: 31 U/L (ref 11.0–59.0)

## 2015-04-08 LAB — IGA: IGA: 360 mg/dL (ref 68–378)

## 2015-04-08 MED ORDER — METOCLOPRAMIDE HCL 5 MG/5ML PO SOLN: ORAL | Status: DC

## 2015-04-08 MED ORDER — PANTOPRAZOLE SODIUM 40 MG PO TBEC: 40.0000 mg | DELAYED_RELEASE_TABLET | Freq: Every day | ORAL | Status: DC

## 2015-04-08 NOTE — Patient Instructions (Signed)
Your physician has requested that you go to the basement for lab work before leaving today  You have been scheduled for a colonoscopy. Please follow written instructions given to you at your visit today.  Please pick up your prep supplies at the pharmacy within the next 1-3 days. If you use inhalers (even only as needed), please bring them with you on the day of your procedure. Your physician has requested that you go to www.startemmi.com and enter the access code given to you at your visit today. This web site gives a general overview about your procedure. However, you should still follow specific instructions given to you by our office regarding your preparation for the procedure.  We have sent the following medications to your pharmacy for you to pick up at your convenience: Pantoprazole and Reglan  Call us in 2 weeks if the Reglan helps   Go directly to the Xray department in the basement after you visit the lab.

## 2015-04-08 NOTE — Progress Notes (Signed)
Patient ID: Caroline [name], female   DOB: 1960/10/03, 54 y.o.   MRN: [id number]    HPI:  Caroline [name] is a 54 y.o.   female  referred by Caroline Gell, MD for evaluation of rectal bleeding.  Patient is known to Dr. Carlean [name] and was seen in April 2012 for epigastric pain. She had an EGD on 01/11/2011 that revealed esophagitis of the distal esophagus and moderate gastritis. Biopsies revealed mild inflammation with no evidence of H. pylori, dysplasia, or malignancy. She states that overall her bowel movements have been okay. She states that several weeks ago she had been sick with a "stomach bug" and had diarrhea. Towards the end of this episode she had bloody bowel movements and blood dripping in the commode. Her diarrhea subsided but 2 weeks ago she had blood in the commode and she has had red streaks on the toilet tissue ever since. She denies any rectal pain, itching, or burning. She admits to a sensation of incomplete evacuation. She is unaware of a family history of colon cancer, colon polyps, or inflammatory bowel disease, but states her mother had IBS. She had a colonoscopy done in 2010 in [place] for screening purposes. She does not remember the name of her gastroenterologist but states her old PCP at [facility] internal medicine may have the colonoscopy report. She states she had no polyps and was advised to have surveillance in about 10 years. She reports her stools occur 2-5 times per day and have been soft formed to mushy for year to year and a half. She states there is a film of oil on the water after every bowel movement. She feels she is more bloated and gassy than she has been in the past. She is unable to identify any specific foods that exacerbate her symptoms.  She is also complaining of left upper quadrant pain. She points to the area over the costal margin. She states the pain is worse after meals. It is not associated with nausea or vomiting. She has no early satiety. The pain is worse if her  parotid rubs on the area and the pain is worse with bending. The pain tends to occur after she sits for prolonged period of time and she states she frequently has to arch her back to stretch the area out. The lower left ribs are sore to the touch. The only thing that alleviates the pain is stretching. She also has pain in her back between her shoulder blades that hurts when twisting to the left. She had left rib films ordered by her primary care provider in early July and it was read as a negative left rib detail. She also reports feeling very full at night and belching a lot at night. She has been using her pantoprazole at bedtime rather than first thing in the morning. She denies breakthrough heartburn periodically gets regurgitation.   Past Medical History  Diagnosis Date  . GERD (gastroesophageal reflux disease)   . Hyperlipidemia   . Hx of cholecystectomy   . Hx of appendectomy   . CAD (coronary artery disease)     has documented coronary vasospasm but with underlying 40% prox LAD, 30% prox LCX and 70 to 80% proximal 1st DX and 30% RCA; recurrent vasospasm with subsequent cath in Jan 2013.   Marland Kitchen Palpitations     tachycardia on holter; treated with beta blockers  . Coronary vasospasm   . Esophagitis   . IBS (irritable bowel syndrome)     Past Surgical History  Procedure Laterality Date  . Cholecystectomy  1982  . Appendectomy  1976  . Breast surgery  2002    biopsy  . Cardiac catheterization  2011  . Upper gastrointestinal endoscopy  01/11/2011    esophagitis  . Cardiac catheterization  Sept 2012    With documented vasospasm but with residual disease; managed medically  . Left heart catheterization with coronary angiogram N/A 09/14/2011    Procedure: LEFT HEART CATHETERIZATION WITH CORONARY ANGIOGRAM;  Surgeon: Burnell Blanks, MD;  Location: [facility] CATH LAB;  Service: Cardiovascular;  Laterality: N/A;   Family History  Problem Relation Age of Onset  . Breast cancer Mother 17  .  Heart disease Mother     died CHF 43  . Diabetes Mother   . Prostate cancer Father   . Hyperlipidemia Father   . Heart disease Father     s/p cabg - alive  . Colon cancer      First cousin on dads side    History  Substance Use Topics  . Smoking status: Former Smoker -- 0.25 packs/day for 30 years    Types: Cigarettes    Quit date: 05/30/2014  . Smokeless tobacco: Never Used  . Alcohol Use: 1.2 oz/week    2 Standard drinks or equivalent per week     Comment: occ   Current Outpatient Prescriptions  Medication Sig Dispense Refill  . acetaminophen (TYLENOL) 500 MG tablet Take 500 mg by mouth every 6 (six) hours as needed. For pain    . albuterol (PROVENTIL HFA;VENTOLIN HFA) 108 (90 BASE) MCG/ACT inhaler Inhale 2 puffs into the lungs every 4 (four) hours as needed for wheezing or shortness of breath. 1 Inhaler 1  . aspirin 81 MG tablet Take 81 mg by mouth daily.      Marland Kitchen atorvastatin (LIPITOR) 20 MG tablet TAKE 1 TABLET BY MOUTH EVERY DAY 90 tablet 0  . diltiazem (CARDIZEM CD) 360 MG 24 hr capsule TAKE ONE CAPSULE BY MOUTH EVERY DAY 90 capsule 1  . isosorbide dinitrate (ISORDIL) 30 MG tablet Take 1 tablet (30 mg total) by mouth 3 (three) times daily. 90 tablet 11  . nitroGLYCERIN (NITROSTAT) 0.4 MG SL tablet Place 1 tablet (0.4 mg total) under the tongue every 5 (five) minutes as needed for chest pain. 25 tablet 1  . pantoprazole (PROTONIX) 40 MG tablet Take 1 tablet (40 mg total) by mouth daily. Take 30 mins prior to breakfast. 30 tablet 6  . ranolazine (RANEXA) 500 MG 12 hr tablet Take 1 tablet (500 mg total) by mouth 2 (two) times daily. 180 tablet 1  . sertraline (ZOLOFT) 50 MG tablet Take 1 tablet (50 mg total) by mouth daily. 90 tablet 3  . metoCLOPramide (REGLAN) 5 MG/5ML solution 1 tsp at HS x 2 weeks 120 mL 0   No current facility-administered medications for this visit.   No Known Allergies   Review of Systems: Gen: Denies any fever, chills, sweats, anorexia, fatigue,  weakness, malaise, weight loss, and sleep disorder CV: Denies chest pain, angina, palpitations, syncope, orthopnea, PND, peripheral edema, and claudication. Resp: Denies dyspnea at rest, dyspnea with exercise, cough, sputum, wheezing, coughing up blood, and pleurisy. GI: Denies vomiting blood, jaundice, and fecal incontinence.   Denies dysphagia or odynophagia. GU : Denies urinary burning, blood in urine, urinary frequency, urinary hesitancy, nocturnal urination, and urinary incontinence. MS: Denies joint pain, limitation of movement, and swelling, stiffness, low back pain, extremity pain. Denies muscle weakness, cramps, atrophy.  Derm: Denies rash,  itching, dry skin, hives, moles, warts, or unhealing ulcers.  Psych: Denies depression, anxiety, memory loss, suicidal ideation, hallucinations, paranoia, and confusion. Heme: Denies bruising, bleeding, and enlarged lymph nodes. Neuro:  Denies any headaches, dizziness, paresthesias. Endo:  Denies any problems with DM, thyroid, adrenal function    Physical Exam: BP 124/72 mmHg  Ht 5\' 3"  (1.6 m)  Wt 185 lb 6 oz (84.086 kg)  BMI 32.85 kg/m2 Constitutional: Pleasant,well-developed, Caucasian female in no acute distress. HEENT: Normocephalic and atraumatic. Conjunctivae are normal. No scleral icterus. Neck supple. No JVD Cardiovascular: Normal rate, regular rhythm.  Pulmonary/chest: Effort normal and breath sounds normal. No wheezing, rales or rhonchi. Tender to palpation over the left lower costal margin. Abdominal: Soft, nondistended, nontender. Bowel sounds active throughout. There are no masses palpable. No hepatomegaly. Rectal: Brown stool, heme positive. Extremities: no edema Lymphadenopathy: No cervical adenopathy noted. Neurological: Alert and oriented to person place and time. Skin: Skin is warm and dry. No rashes noted. Psychiatric: Normal mood and affect. Behavior is normal.  ASSESSMENT AND PLAN: #1. Rectal bleeding, and  heme-positive stools. Patient will sign a medical release to try to get a copy of her colonoscopy report from Dr. Radford Pax at [facility] internal medicine in [place]. She will be scheduled for a colonoscopy to evaluate for polyps, neoplasia, IBD, internal hemorrhoids etc.The risks, benefits, and alternatives to colonoscopy with possible biopsy and possible polypectomy were discussed with the patient and they consent to proceed. The procedure will be scheduled with Dr. Carlean [name]. A CBC will be obtained. An IgA, TTG, and pancreatic fecal elastase will also the obtained.  #2. Left costal margin tenderness. Patient also complains of pain in the thoracic spine that is worse with twisting to the left. Will obtain thoracic spine films to evaluate for possible compression fracture.  #3. Dyspepsia. This likely due to her GERD. An antireflux regimen has been reviewed. She's been instructed to change the timing of her pantoprazole to 30 minutes prior to breakfast. She will also be given an empiric short-term trial of Reglan syrup 5 mg per 5 ML's 1 teaspoon at at bedtime for 2 weeks. She has been instructed to call us in 2 weeks and let us know if it helps with her sensation of fullness that she experiences at bedtime. She has been advised of the possible side effects of Reglan and has been instructed to call us immediately if she has any intolerance to it and to discontinue the medication immediately. A comprehensive metabolic panel and lipase will be obtained.  Further recommendations will be made pending the findings of the above.  Peter Daquila, Deloris Ping 04/08/2015, 12:37 PM  CC: Caroline Gell, MD

## 2015-04-09 LAB — TISSUE TRANSGLUTAMINASE, IGA: TISSUE TRANSGLUTAMINASE AB, IGA: 1 U/mL (ref <4)

## 2015-04-10 ENCOUNTER — Other Ambulatory Visit: Payer: Self-pay | Admitting: Cardiology

## 2015-04-19 NOTE — Progress Notes (Signed)
Agree w/ Ms. Hvozdovic's note and mangement.  

## 2015-05-19 ENCOUNTER — Ambulatory Visit (AMBULATORY_SURGERY_CENTER): Payer: Managed Care, Other (non HMO) | Admitting: Internal Medicine

## 2015-05-19 VITALS — BP 118/73 | HR 66 | Temp 98.1°F | Resp 22 | Ht 63.0 in | Wt 185.0 lb

## 2015-05-19 DIAGNOSIS — K644 Residual hemorrhoidal skin tags: Secondary | ICD-10-CM | POA: Diagnosis not present

## 2015-05-19 DIAGNOSIS — K625 Hemorrhage of anus and rectum: Secondary | ICD-10-CM

## 2015-05-19 DIAGNOSIS — K648 Other hemorrhoids: Secondary | ICD-10-CM | POA: Diagnosis not present

## 2015-05-19 MED ORDER — SODIUM CHLORIDE 0.9 % IV SOLN: 500.0000 mL | INTRAVENOUS | Status: DC

## 2015-05-19 NOTE — Progress Notes (Signed)
Transferred to recovery room. A/O x3, pleased with MAC.  VSS.  Report to Suzanne, RN. 

## 2015-05-19 NOTE — Patient Instructions (Addendum)
I found hemorrhoids which are causing the bleeding. If they continue to bother you I can place rubber bands on them to reduce or eliminate the symptoms. Please call the office for an appointment if desired.  Next routine colonoscopy in 10 years - 2026  I appreciate the opportunity to care for you. Gatha Mayer, MD, FACG  YOU HAD AN ENDOSCOPIC PROCEDURE TODAY AT [facility]:   Refer to the procedure report that was given to you for any specific questions about what was found during the examination.  If the procedure report does not answer your questions, please call your gastroenterologist to clarify.  If you requested that your care partner not be given the details of your procedure findings, then the procedure report has been included in a sealed envelope for you to review at your convenience later.  YOU SHOULD EXPECT: Some feelings of bloating in the abdomen. Passage of more gas than usual.  Walking can help get rid of the air that was put into your GI tract during the procedure and reduce the bloating. If you had a lower endoscopy (such as a colonoscopy or flexible sigmoidoscopy) you may notice spotting of blood in your stool or on the toilet paper. If you underwent a bowel prep for your procedure, you may not have a normal bowel movement for a few days.  Please Note:  You might notice some irritation and congestion in your nose or some drainage.  This is from the oxygen used during your procedure.  There is no need for concern and it should clear up in a day or so.  SYMPTOMS TO REPORT IMMEDIATELY:   Following lower endoscopy (colonoscopy or flexible sigmoidoscopy):  Excessive amounts of blood in the stool  Significant tenderness or worsening of abdominal pains  Swelling of the abdomen that is new, acute  Fever of 100F or higher   For urgent or emergent issues, a gastroenterologist can be reached at any hour by calling [phone].   DIET: Your first  meal following the procedure should be a small meal and then it is ok to progress to your normal diet. Heavy or fried foods are harder to digest and may make you feel nauseous or bloated.  Likewise, meals heavy in dairy and vegetables can increase bloating.  Drink plenty of fluids but you should avoid alcoholic beverages for 24 hours.  ACTIVITY:  You should plan to take it easy for the rest of today and you should NOT DRIVE or use heavy machinery until tomorrow (because of the sedation medicines used during the test).    FOLLOW UP: Our staff will call the number listed on your records the next business day following your procedure to check on you and address any questions or concerns that you may have regarding the information given to you following your procedure. If we do not reach you, we will leave a message.  However, if you are feeling well and you are not experiencing any problems, there is no need to return our call.  We will assume that you have returned to your regular daily activities without incident.  If any biopsies were taken you will be contacted by phone or by letter within the next 1-3 weeks.  Please call us at [phone] if you have not heard about the biopsies in 3 weeks.    SIGNATURES/CONFIDENTIALITY: You and/or your care partner have signed paperwork which will be entered into your electronic medical record.  These signatures  attest to the fact that that the information above on your After Visit Summary has been reviewed and is understood.  Full responsibility of the confidentiality of this discharge information lies with you and/or your care-partner.  Banding  Pamphlet given per Dr. Celesta Aver request.

## 2015-05-19 NOTE — Progress Notes (Signed)
Slight bruise noted on r hand before IV taken out.

## 2015-05-19 NOTE — Op Note (Signed)
[place]  [address] [place], [id number]   COLONOSCOPY PROCEDURE REPORT  PATIENT: Caroline [name], Caroline [name]  MR#: [id number] BIRTHDATE: December 13, 1960 , 2  yrs. old GENDER: female ENDOSCOPIST: Gatha Mayer, MD, [facility] PROCEDURE DATE:  05/19/2015 PROCEDURE:   Colonoscopy, diagnostic First Screening Colonoscopy - Avg.  risk and is 50 yrs.  old or older - No.  Prior Negative Screening - Now for repeat screening. N/A  History of Adenoma - Now for follow-up colonoscopy & has been > or = to 3 yrs.  N/A  Polyps removed today? No Recommend repeat exam, <10 yrs? No ASA CLASS:   Class II INDICATIONS:Evaluation of unexplained GI bleeding and Patient is not applicable for Colorectal Neoplasm Risk Assessment for this procedure. MEDICATIONS: Propofol 250 mg IV and Monitored anesthesia care  DESCRIPTION OF PROCEDURE:   After the risks benefits and alternatives of the procedure were thoroughly explained, informed consent was obtained.  The digital rectal exam revealed several skin tags and revealed no rectal mass.   The [id number] endoscope was introduced through the anus and advanced to the cecum, which was identified by both the appendix and ileocecal valve. No adverse events experienced.   The quality of the prep was excellent.  (MiraLax was used)  The instrument was then slowly withdrawn as the colon was fully examined. Estimated blood loss is zero unless otherwise noted in this procedure report.   COLON FINDINGS: There was mild diverticulosis noted in the sigmoid colon.   The examination was otherwise normal.   External and internal hemorrhoids were found.  Retroflexed views revealed internal hemorrhoids and Retroflexed views revealed external hemorrhoids. The time to cecum = 1.5 Withdrawal time = 7.9   The scope was withdrawn and the procedure completed. COMPLICATIONS: There were no immediate complications.  ENDOSCOPIC IMPRESSION: 1.   There was mild diverticulosis  noted in the sigmoid colon 2.   The colon examination was otherwise normal 3.   External and internal hemorrhoids in anorectum  RECOMMENDATIONS: 1.  Repeat colonoscopy 10 years. 2.  Consider hemorrhoid banding if desired  eSigned:  Gatha Mayer, MD, [facility] 05/19/2015 2:24 PM   cc: The Patient and Carolann Littler, MD

## 2015-05-20 ENCOUNTER — Telehealth: Payer: Self-pay

## 2015-05-20 ENCOUNTER — Telehealth: Payer: Self-pay | Admitting: *Deleted

## 2015-05-20 DIAGNOSIS — R112 Nausea with vomiting, unspecified: Secondary | ICD-10-CM

## 2015-05-20 DIAGNOSIS — R6881 Early satiety: Secondary | ICD-10-CM

## 2015-05-20 DIAGNOSIS — R1013 Epigastric pain: Secondary | ICD-10-CM

## 2015-05-20 NOTE — Telephone Encounter (Signed)
-----   Message from Gatha Mayer, MD sent at 05/19/2015  2:35 PM EDT ----- Regarding: gastric emptying Please order 4 hr GES to evaluate early satiety, epigastric pain and nausea and vomiting

## 2015-05-20 NOTE — Telephone Encounter (Signed)
  Follow up Call-  No answer, left message to call if questions or concerns.    

## 2015-05-20 NOTE — Telephone Encounter (Signed)
Patient is scheduled for GES at [facility] for 05/30/15 7:30  Left message for patient to call back

## 2015-05-21 NOTE — Telephone Encounter (Signed)
Patient notified of the appt date, time and instructions.

## 2015-05-30 ENCOUNTER — Ambulatory Visit (HOSPITAL_COMMUNITY): Payer: Managed Care, Other (non HMO)

## 2015-06-02 ENCOUNTER — Other Ambulatory Visit: Payer: Self-pay | Admitting: Cardiology

## 2015-06-12 ENCOUNTER — Encounter (HOSPITAL_COMMUNITY)
Admission: RE | Admit: 2015-06-12 | Discharge: 2015-06-12 | Disposition: A | Discharge status: Home / Self Care | Payer: Managed Care, Other (non HMO) | Source: Ambulatory Visit | Attending: Internal Medicine | Admitting: Internal Medicine

## 2015-06-12 DIAGNOSIS — R112 Nausea with vomiting, unspecified: Secondary | ICD-10-CM | POA: Diagnosis not present

## 2015-06-12 DIAGNOSIS — R6881 Early satiety: Secondary | ICD-10-CM

## 2015-06-12 DIAGNOSIS — R1013 Epigastric pain: Secondary | ICD-10-CM | POA: Insufficient documentation

## 2015-06-12 MED ORDER — TECHNETIUM TC 99M SULFUR COLLOID
2.0000 | Freq: Once | INTRAVENOUS | Status: DC | PRN
  Administered 2015-06-12: 2 via INTRAVENOUS
  Filled 2015-06-12: qty 2

## 2015-06-15 NOTE — Progress Notes (Signed)
Quick Note:  Stomach empties normally Please add ranitidine 300 mg before supper to her regimen # 90 no refill Tell her I want to see if that helps her feel better REV me 2-3 months ______

## 2015-06-16 ENCOUNTER — Other Ambulatory Visit: Payer: Self-pay

## 2015-06-16 MED ORDER — RANITIDINE HCL 300 MG PO CAPS: 300.0000 mg | ORAL_CAPSULE | Freq: Every evening | ORAL | Status: DC

## 2015-06-30 ENCOUNTER — Encounter: Payer: Self-pay | Admitting: Cardiology

## 2015-07-08 ENCOUNTER — Other Ambulatory Visit: Payer: Self-pay | Admitting: Cardiology

## 2015-07-16 ENCOUNTER — Encounter: Payer: Self-pay | Admitting: Cardiology

## 2015-07-16 ENCOUNTER — Ambulatory Visit (INDEPENDENT_AMBULATORY_CARE_PROVIDER_SITE_OTHER): Payer: Managed Care, Other (non HMO) | Admitting: Cardiology

## 2015-07-16 VITALS — BP 130/72 | HR 83 | Ht 64.5 in | Wt 196.0 lb

## 2015-07-16 DIAGNOSIS — E7800 Pure hypercholesterolemia, unspecified: Secondary | ICD-10-CM | POA: Diagnosis not present

## 2015-07-16 DIAGNOSIS — R1013 Epigastric pain: Secondary | ICD-10-CM | POA: Diagnosis not present

## 2015-07-16 DIAGNOSIS — R7989 Other specified abnormal findings of blood chemistry: Secondary | ICD-10-CM

## 2015-07-16 DIAGNOSIS — I201 Angina pectoris with documented spasm: Secondary | ICD-10-CM

## 2015-07-16 DIAGNOSIS — R945 Abnormal results of liver function studies: Secondary | ICD-10-CM

## 2015-07-16 MED ORDER — DILTIAZEM HCL ER COATED BEADS 360 MG PO CP24: 360.0000 mg | ORAL_CAPSULE | Freq: Every day | ORAL | Status: DC

## 2015-07-16 MED ORDER — NITROGLYCERIN 0.4 MG SL SUBL: 0.4000 mg | SUBLINGUAL_TABLET | SUBLINGUAL | Status: DC | PRN

## 2015-07-16 MED ORDER — RANOLAZINE ER 500 MG PO TB12: 500.0000 mg | ORAL_TABLET | Freq: Two times a day (BID) | ORAL | Status: DC

## 2015-07-16 NOTE — Patient Instructions (Signed)
Medication Instructions:  STOP LIPITOR   Labwork: HFP IN 1 MONTH   Testing/Procedures: NONE  Follow-Up: Your physician recommends that you schedule a follow-up appointment in: [place] NP  Any Other Special Instructions Will Be Listed Below (If Applicable).     If you need a refill on your cardiac medications before your next appointment, please call your pharmacy.

## 2015-07-16 NOTE — Progress Notes (Signed)
Cardiology Office Note   Date:  07/16/2015   ID:  Caroline [name], DOB 09-15-1960, MRN [id number]  PCP:  Eulas Post, MD  Cardiologist: Darlin Coco MD  Chief Complaint  Patient presents with  . Chest Pain      History of Present Illness: Caroline [name] is a 54 y.o. female who presents for scheduled follow-up office visit This pleasant 54 year-old woman is seen for a followup office visit. She has a history of Prinzmetal angina. 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 She sits at a desk but is up and about the office intermittently during the day. She has not been experiencing any calf pain to suggest deep vein thrombosis. The patient has occasional substernal chest pressure relieved by nitroglycerin. She has a history of a right shoulder injury and sometimes experiences bilateral shoulder pain radiating into the chest which masquerades as her chest pain.  Recently she has had a lot of GI issues.  She has had fresh bright blood in her stool.  She has had a colonoscopy and diverticulosis was found.  She has also had what sounds like a barium swallow.  She has been more short of breath.  She has been gaining weight even though she is not eating any more than usual.  Her weight is up 11 pounds since last visit.  She had her annual physical at work on 06/27/15.  I reviewed her labs from that visit.  Everything was good except her liver function studies were elevated.  Her hemoglobin was 14.5.  Her LDL was 66 and her HDL was 75. In regard to her elevated liver function studies.  The patient does not drink much alcohol.  She has perhaps one glass of wine a week.  She is on atorvastatin 20 mg daily which we will stop at this time and observe response of LFTs.  She may be developing fatty liver.  Past Medical History  Diagnosis Date  . GERD (gastroesophageal reflux disease)   .  Hyperlipidemia   . Hx of cholecystectomy   . Hx of appendectomy   . CAD (coronary artery disease)     has documented coronary vasospasm but with underlying 40% prox LAD, 30% prox LCX and 70 to 80% proximal 1st DX and 30% RCA; recurrent vasospasm with subsequent cath in Jan 2013.   Marland Kitchen Palpitations     tachycardia on holter; treated with beta blockers  . Coronary vasospasm ([place])   . Esophagitis   . IBS (irritable bowel syndrome)     Past Surgical History  Procedure Laterality Date  . Cholecystectomy  1982  . Appendectomy  1976  . Breast surgery  2002    biopsy  . Cardiac catheterization  2011  . Upper gastrointestinal endoscopy  01/11/2011    esophagitis  . Cardiac catheterization  Sept 2012    With documented vasospasm but with residual disease; managed medically  . Left heart catheterization with coronary angiogram N/A 09/14/2011    Procedure: LEFT HEART CATHETERIZATION WITH CORONARY ANGIOGRAM;  Surgeon: Burnell Blanks, MD;  Location: [facility] CATH LAB;  Service: Cardiovascular;  Laterality: N/A;     Current Outpatient Prescriptions  Medication Sig Dispense Refill  . acetaminophen (TYLENOL) 500 MG tablet Take 500 mg by mouth every 6 (six) hours as needed. For pain    . albuterol (PROVENTIL HFA;VENTOLIN HFA) 108 (90 BASE) MCG/ACT inhaler Inhale  2 puffs into the lungs every 4 (four) hours as needed for wheezing or shortness of breath. 1 Inhaler 1  . aspirin 81 MG tablet Take 81 mg by mouth daily.      Marland Kitchen diltiazem (CARDIZEM CD) 360 MG 24 hr capsule TAKE ONE CAPSULE BY MOUTH EVERY DAY 90 capsule 1  . isosorbide dinitrate (ISORDIL) 30 MG tablet TAKE 1 TABLET BY MOUTH THREE TIMES DAILY 90 tablet 3  . nitroGLYCERIN (NITROSTAT) 0.4 MG SL tablet Place 1 tablet (0.4 mg total) under the tongue every 5 (five) minutes as needed for chest pain. 25 tablet 1  . pantoprazole (PROTONIX) 40 MG tablet Take 1 tablet (40 mg total) by mouth daily. Take 30 mins prior to breakfast. 30 tablet 6  .  pantoprazole (PROTONIX) 40 MG tablet TAKE 1 TABLET BY MOUTH EVERY DAY 90 tablet 1  . RANEXA 500 MG 12 hr tablet TAKE 1 TABLET BY MOUTH TWICE A DAY 180 tablet 1  . ranitidine (ZANTAC) 300 MG capsule Take 1 capsule (300 mg total) by mouth every evening. 90 capsule 0  . sertraline (ZOLOFT) 50 MG tablet Take 1 tablet (50 mg total) by mouth daily. 90 tablet 3   No current facility-administered medications for this visit.    Allergies:   Review of patient's allergies indicates no known allergies.    Social History:  The patient  reports that she quit smoking about 13 months ago. Her smoking use included Cigarettes. She has a 7.5 pack-year smoking history. She has never used smokeless tobacco. She reports that she drinks about 1.2 oz of alcohol per week. She reports that she does not use illicit drugs.   Family History:  The patient's family history includes Breast cancer (age of onset: 50) in her mother; Colon cancer in an other family member; Diabetes in her mother; Heart disease in her father and mother; Hyperlipidemia in her father; Prostate cancer in her father.    ROS:  Please see the history of present illness.   Otherwise, review of systems are positive for none.   All other systems are reviewed and negative.    PHYSICAL EXAM: VS:  BP 130/72 mmHg  Pulse 83  Ht 5' 4.5" (1.638 m)  Wt 196 lb (88.905 kg)  BMI 33.14 kg/m2 , BMI Body mass index is 33.14 kg/(m^2). GEN: Well nourished, well developed, in no acute distress HEENT: normal Neck: no JVD, carotid bruits, or masses Cardiac: RRR; no murmurs, rubs, or gallops,no edema  Respiratory:  clear to auscultation bilaterally, normal work of breathing GI: soft, nontender, nondistended, + BS MS: no deformity or atrophy Skin: warm and dry, no rash Neuro:  Strength and sensation are intact Psych: euthymic mood, full affect   EKG:  EKG is ordered today. The ekg ordered today demonstrates NSR.  No ischemic changes   Recent Labs: 04/08/2015:  ALT 32; BUN 14; Creatinine, Ser 0.77; Hemoglobin 14.9; Platelets 253.0; Potassium 4.5; Sodium 139    Lipid Panel    Component Value Date/Time   CHOL 169 06/10/2014 0746   TRIG 71.0 06/10/2014 0746   HDL 74.50 06/10/2014 0746   CHOLHDL 2 06/10/2014 0746   VLDL 14.2 06/10/2014 0746   LDLCALC 80 06/10/2014 0746      Wt Readings from Last 3 Encounters:  07/16/15 196 lb (88.905 kg)  05/19/15 185 lb (83.915 kg)  04/08/15 185 lb 6 oz (84.086 kg)        ASSESSMENT AND PLAN:  1. Prinzmetal angina 2. Dyslipidemia 3. Dyspepsia  4. respiratory allergies 5.  Weight gain and symptoms of abdominal distention. 6.  Mildly elevated LFTs.  We will temporarily stop her statin drugs. 7.  Hematochezia being evaluated by Dr. Carlean Purl   Current medicines are reviewed at length with the patient today.  The patient does not have concerns regarding medicines.  The following changes have been made:  Stop  atorvastatin  Labs/ tests ordered today include:   Orders Placed This Encounter  Procedures  . Hepatic function panel  . EKG 12-Lead     Disposition:   We will have her return in one month for follow-up LFTs.  She will return in 4 months for follow-up office visit with Cecille Rubin.  Continue close GI follow-up with Dr. Carlean Purl.  Berna Spare MD 07/16/2015 6:03 PM    [facility] [place], [place], [place]  [address] Phone: [phone]; Fax: [phone]

## 2015-08-13 ENCOUNTER — Other Ambulatory Visit (INDEPENDENT_AMBULATORY_CARE_PROVIDER_SITE_OTHER): Payer: Managed Care, Other (non HMO) | Admitting: *Deleted

## 2015-08-13 DIAGNOSIS — R945 Abnormal results of liver function studies: Principal | ICD-10-CM

## 2015-08-13 DIAGNOSIS — R7989 Other specified abnormal findings of blood chemistry: Secondary | ICD-10-CM

## 2015-08-13 LAB — HEPATIC FUNCTION PANEL
ALT: 38 U/L — ABNORMAL HIGH (ref 6–29)
AST: 24 U/L (ref 10–35)
Albumin: 4.2 g/dL (ref 3.6–5.1)
Alkaline Phosphatase: 74 U/L (ref 33–130)
BILIRUBIN DIRECT: 0.1 mg/dL (ref <=0.2)
BILIRUBIN INDIRECT: 0.3 mg/dL (ref 0.2–1.2)
BILIRUBIN TOTAL: 0.4 mg/dL (ref 0.2–1.2)
Total Protein: 7.4 g/dL (ref 6.1–8.1)

## 2015-09-25 ENCOUNTER — Encounter: Payer: Self-pay | Admitting: Family Medicine

## 2015-09-25 ENCOUNTER — Ambulatory Visit (INDEPENDENT_AMBULATORY_CARE_PROVIDER_SITE_OTHER): Payer: BLUE CROSS/BLUE SHIELD | Admitting: Family Medicine

## 2015-09-25 VITALS — BP 120/80 | HR 105 | Temp 98.6°F | Ht 64.5 in | Wt 191.3 lb

## 2015-09-25 DIAGNOSIS — J0100 Acute maxillary sinusitis, unspecified: Secondary | ICD-10-CM | POA: Diagnosis not present

## 2015-09-25 MED ORDER — AMOXICILLIN-POT CLAVULANATE 875-125 MG PO TABS: 1.0000 | ORAL_TABLET | Freq: Two times a day (BID) | ORAL | Status: DC

## 2015-09-25 MED ORDER — ALBUTEROL SULFATE HFA 108 (90 BASE) MCG/ACT IN AERS
2.0000 | INHALATION_SPRAY | RESPIRATORY_TRACT | Status: DC | PRN
Start: 2015-09-25 — End: 2016-11-24

## 2015-09-25 NOTE — Progress Notes (Signed)
   Subjective:    Patient ID: Caroline [name], female    DOB: [birth year], 55 y.o.   MRN: [id number]  HPI Patient seen with one month history of bilateral maxillary facial pain. She states she started with cold-like symptoms a month ago but his never fully resolved. She's had progressive thick nasal discharge, intermittent headaches, cough, bilateral maxillary facial pain. She's tried Mucinex without much improvement. No fevers or chills.  Past Medical History  Diagnosis Date  . GERD (gastroesophageal reflux disease)   . Hyperlipidemia   . Hx of cholecystectomy   . Hx of appendectomy   . CAD (coronary artery disease)     has documented coronary vasospasm but with underlying 40% prox LAD, 30% prox LCX and 70 to 80% proximal 1st DX and 30% RCA; recurrent vasospasm with subsequent cath in Jan 2013.   Marland Kitchen Palpitations     tachycardia on holter; treated with beta blockers  . Coronary vasospasm ([place])   . Esophagitis   . IBS (irritable bowel syndrome)    Past Surgical History  Procedure Laterality Date  . Cholecystectomy  1982  . Appendectomy  1976  . Breast surgery  2002    biopsy  . Cardiac catheterization  2011  . Upper gastrointestinal endoscopy  01/11/2011    esophagitis  . Cardiac catheterization  Sept 2012    With documented vasospasm but with residual disease; managed medically  . Left heart catheterization with coronary angiogram N/A 09/14/2011    Procedure: LEFT HEART CATHETERIZATION WITH CORONARY ANGIOGRAM;  Surgeon: Burnell Blanks, MD;  Location: [facility] CATH LAB;  Service: Cardiovascular;  Laterality: N/A;    reports that she quit smoking about 15 months ago. Her smoking use included Cigarettes. She has a 7.5 pack-year smoking history. She has never used smokeless tobacco. She reports that she drinks about 1.2 oz of alcohol per week. She reports that she does not use illicit drugs. family history includes Breast cancer (age of onset: 53) in her mother; Diabetes in her mother;  Heart disease in her father and mother; Hyperlipidemia in her father; Prostate cancer in her father. No Known Allergies    Review of Systems  Constitutional: Negative for fever and chills.  HENT: Positive for congestion and sinus pressure.   Respiratory: Positive for cough.   Neurological: Positive for headaches.       Objective:   Physical Exam  Constitutional: She appears well-developed and well-nourished.  HENT:  Mouth/Throat: Oropharynx is clear and moist.  Nasal mucosa erythematous with some thick yellow dried mucous right naris. Cerumen impaction right canal  Neck: Neck supple.  Cardiovascular: Normal rate and regular rhythm.   Pulmonary/Chest: Effort normal and breath sounds normal. No respiratory distress. She has no wheezes. She has no rales.  Lymphadenopathy:    She has no cervical adenopathy.          Assessment & Plan:  Acute bilateral maxillary sinusitis. Given duration of symptoms start Augmentin 875 mg twice daily for 10 days. Continue Mucinex. Follow-up as needed.

## 2015-09-25 NOTE — Progress Notes (Signed)
Pre visit review using our clinic review tool, if applicable. No additional management support is needed unless otherwise documented below in the visit note. 

## 2015-09-25 NOTE — Patient Instructions (Signed)

## 2015-11-04 NOTE — Progress Notes (Signed)
Cardiology Office Note:    Date:  11/05/2015   ID:  Caroline [name], DOB [birth year], MRN [id number]  PCP:  Eulas Post, MD  Cardiologist:  Dr. Darlin Coco >> Dr. Lauree Chandler [facility], PA-C)  Electrophysiologist:  n/a  Chief Complaint  Patient presents with  . Prinzmetal's Angina    Follow up    History of Present Illness:     Caroline [name] is a 55 y.o. female with a hx of Prinzmetal Angina controlled on Diltiazem, Isosorbide DN and Ranexa, HL, IBS, GERD.  Last seen by Dr. Darlin Coco 11/16.  She had elevated LFTs on a recent work physical and Dr. Mare Ferrari held her statin.  LFTs in 12/16 were improved.    She returns for FU.  She is doing well.  Has occ CP but no significant change in symptoms.  She denies sig DOE. Denies orthopnea, PND, edema.  No syncope. She did lose balance 6 mos ago and fell x 2.  No syncope.  She has occ dizziness.     Past Medical History  Diagnosis Date  . GERD (gastroesophageal reflux disease)   . Hyperlipidemia   . Hx of cholecystectomy   . Hx of appendectomy   . CAD (coronary artery disease)     has documented coronary vasospasm but with underlying 40% prox LAD, 30% prox LCX and 70 to 80% proximal 1st DX and 30% RCA; recurrent vasospasm with subsequent cath in Jan 2013.   Marland Kitchen Palpitations     tachycardia on holter; treated with beta blockers  . Coronary vasospasm ([place])   . Esophagitis   . IBS (irritable bowel syndrome)     Past Surgical History  Procedure Laterality Date  . Cholecystectomy  1982  . Appendectomy  1976  . Breast surgery  2002    biopsy  . Cardiac catheterization  2011  . Upper gastrointestinal endoscopy  01/11/2011    esophagitis  . Cardiac catheterization  Sept 2012    With documented vasospasm but with residual disease; managed medically  . Left heart catheterization with coronary angiogram N/A 09/14/2011    Procedure: LEFT HEART CATHETERIZATION WITH CORONARY ANGIOGRAM;  Surgeon: Burnell Blanks, MD;  Location: [facility] CATH LAB;  Service: Cardiovascular;  Laterality: N/A;    Current Medications: Outpatient Prescriptions Prior to Visit  Medication Sig Dispense Refill  . acetaminophen (TYLENOL) 500 MG tablet Take 500 mg by mouth every 6 (six) hours as needed. For pain    . albuterol (PROVENTIL HFA;VENTOLIN HFA) 108 (90 Base) MCG/ACT inhaler Inhale 2 puffs into the lungs every 4 (four) hours as needed for wheezing or shortness of breath. 1 Inhaler 2  . aspirin 81 MG tablet Take 81 mg by mouth daily.      Marland Kitchen diltiazem (CARDIZEM CD) 360 MG 24 hr capsule Take 1 capsule (360 mg total) by mouth daily. 90 capsule 3  . nitroGLYCERIN (NITROSTAT) 0.4 MG SL tablet Place 1 tablet (0.4 mg total) under the tongue every 5 (five) minutes as needed for chest pain. 25 tablet prn  . ranitidine (ZANTAC) 300 MG capsule Take 1 capsule (300 mg total) by mouth every evening. 90 capsule 0  . ranolazine (RANEXA) 500 MG 12 hr tablet Take 1 tablet (500 mg total) by mouth 2 (two) times daily. 180 tablet 3  . sertraline (ZOLOFT) 50 MG tablet Take 1 tablet (50 mg total) by mouth daily. 90 tablet 3  . isosorbide dinitrate (ISORDIL) 30 MG tablet TAKE 1 TABLET BY MOUTH THREE TIMES DAILY  90 tablet 3  . amoxicillin-clavulanate (AUGMENTIN) 875-125 MG tablet Take 1 tablet by mouth 2 (two) times daily. 20 tablet 0   No facility-administered medications prior to visit.     Allergies:   Review of patient's allergies indicates no known allergies.   Social History   Social History  . Marital Status: Married    Spouse Name: N/A  . Number of Children: N/A  . Years of Education: N/A   Social History Main Topics  . Smoking status: Former Smoker -- 0.25 packs/day for 30 years    Types: Cigarettes    Quit date: 05/30/2014  . Smokeless tobacco: Never Used  . Alcohol Use: 1.2 oz/week    2 Standard drinks or equivalent per week     Comment: occ  . Drug Use: No  . Sexual Activity: Yes   Other Topics Concern  . None     Social History Narrative   Caffeine drinks 2 daily      Family History:  The patient's family history includes Breast cancer (age of onset: 79) in her mother; Diabetes in her mother; Heart disease in her father and mother; Hyperlipidemia in her father; Prostate cancer in her father.   ROS:   Please see the history of present illness.    Review of Systems  Constitution: Positive for malaise/fatigue and weight loss.  Eyes: Positive for visual disturbance.  Cardiovascular: Positive for dyspnea on exertion.  Respiratory: Positive for snoring and wheezing.   Hematologic/Lymphatic: Bruises/bleeds easily.  Musculoskeletal: Positive for myalgias.  Neurological: Positive for loss of balance.  All other systems reviewed and are negative.   Physical Exam:    VS:  BP 110/64 mmHg  Pulse 76  Ht 5' 4.5" (1.638 m)  Wt 187 lb (84.823 kg)  BMI 31.61 kg/m2   GEN: Well nourished, well developed, in no acute distress HEENT: normal Neck: no JVD, no masses Cardiac: Normal S1/S2, RRR;  no edema no carotid bruits bilat   Respiratory:  clear to auscultation bilaterally; no wheezing, rhonchi or rales GI: soft, nontender  MS: no deformity or atrophy Skin: warm and dry  Neuro:   no focal deficits  Psych: Alert and oriented x 3, normal affect  Wt Readings from Last 3 Encounters:  11/05/15 187 lb (84.823 kg)  09/25/15 191 lb 4.8 oz (86.773 kg)  07/16/15 196 lb (88.905 kg)      Studies/Labs Reviewed:     EKG:  EKG is  ordered today.  The ekg ordered today demonstrates NSR, HR 78, normal axis, no ST changes, QTc 421 ms, no change from prior tracing   Recent Labs: 04/08/2015: BUN 14; Creatinine, Ser 0.77; Hemoglobin 14.9; Platelets 253.0; Potassium 4.5; Sodium 139 08/13/2015: ALT 38*   Recent Lipid Panel    Component Value Date/Time   CHOL 169 06/10/2014 0746   TRIG 71.0 06/10/2014 0746   HDL 74.50 06/10/2014 0746   CHOLHDL 2 06/10/2014 0746   VLDL 14.2 06/10/2014 0746   LDLCALC 80  06/10/2014 0746    Additional studies/ records that were reviewed today include:   LHC 1/13 LM: No obstructive disease. As noted above, pt with intense vasospasm at beginning of case.  LAD: Ostial spasm however after NTG min obstructive disease in the ostium with mild calcification. Dx ostial 50% LCx: 99% mid >> after NTG given this area responded well with minimal 20% stenosis. pOM1 30% stenosis.  RCA: Large, dominant vessel with 30% mid stenosis.  Left Ventricular Angiogram: LVEF=55%.  Impression: 1. Coronary  vasospasm 2. Mild CAD 3. Preserved LV systolic function Recommendations: Based on her presentation and intense coronary vasospasm, will adjust medications accordingly. Will use NTG IV today. Change calcium channel blocker to long acting and start Imdur BID tonight.   Holter 11/12 Normal    ASSESSMENT:     1. Coronary vasospasm ([facility])   2. Coronary artery disease involving native coronary artery of native heart without angina pectoris   3. Dyslipidemia   4. Nonspecific elevation of levels of transaminase or lactic acid dehydrogenase (LDH)     PLAN:     In order of problems listed above:  1. Coronary Vasospasm - Overall stable. She does have fatigue and has been dizzy at times in the past. Her BP is 110. I will decrease her Diltiazem to 300 mg QD.  Continue current dose of Isordil, Ranexa.  2. CAD - Minimal CAD on cath after IC NTG in 2013.  Continue ASA. With improved LFTs, it is reasonable to resume statin Rx.    3. HL - Resume Lipitor 20 QD.  Check Lipids and LFTs in 4 weeks.  4. Elevated LFTs - If LFTs remain elevated on repeat, she will need to FU with PCP to further evaluate.    Medication Adjustments/Labs and Tests Ordered: Current medicines are reviewed at length with the patient today.  Concerns regarding medicines are outlined above.  Medication changes, Labs and Tests ordered today are outlined in the Patient Instructions noted below. Patient Instructions    Medication Instructions:  1. FINISH YOUR BOTTLE OF CARDIZEM CD 360 MG ; THEN GO TO # 2 2. START CARDIZEM CD 300 MG DAILY AFTER YOU FINISH THE BOTTLE OF THE 360 MG TABLET 3. RESTART LIPITOR 20 MG DAILY  Labwork: 12/05/15 FOR FASTING LIPID AND LIVER PANEL   Testing/Procedures: NONE  Follow-Up: Your physician wants you to follow-up in: 6 MONTHS  You will receive a reminder letter in the mail two months in advance. If you don't receive a letter, please call our office to schedule the follow-up appointment.  Any Other Special Instructions Will Be Listed Below (If Applicable).  If you need a refill on your cardiac medications before your next appointment, please call your pharmacy.   Signed, Richardson Dopp, PA-C  11/05/2015 4:42 PM    [facility] [place], [place], [place]  [address] Phone: [phone]; Fax: [phone]

## 2015-11-05 ENCOUNTER — Ambulatory Visit (INDEPENDENT_AMBULATORY_CARE_PROVIDER_SITE_OTHER): Payer: BLUE CROSS/BLUE SHIELD | Admitting: Physician Assistant

## 2015-11-05 ENCOUNTER — Encounter: Payer: Self-pay | Admitting: Physician Assistant

## 2015-11-05 VITALS — BP 110/64 | HR 76 | Ht 64.5 in | Wt 187.0 lb

## 2015-11-05 DIAGNOSIS — R740 Nonspecific elevation of levels of transaminase and lactic acid dehydrogenase [LDH]: Secondary | ICD-10-CM

## 2015-11-05 DIAGNOSIS — I201 Angina pectoris with documented spasm: Secondary | ICD-10-CM

## 2015-11-05 DIAGNOSIS — I2510 Atherosclerotic heart disease of native coronary artery without angina pectoris: Secondary | ICD-10-CM | POA: Diagnosis not present

## 2015-11-05 DIAGNOSIS — R7401 Elevation of levels of liver transaminase levels: Secondary | ICD-10-CM

## 2015-11-05 DIAGNOSIS — E785 Hyperlipidemia, unspecified: Secondary | ICD-10-CM | POA: Diagnosis not present

## 2015-11-05 MED ORDER — DILTIAZEM HCL ER COATED BEADS 300 MG PO CP24: 300.0000 mg | ORAL_CAPSULE | Freq: Every day | ORAL | Status: DC

## 2015-11-05 MED ORDER — ISOSORBIDE DINITRATE 30 MG PO TABS: 30.0000 mg | ORAL_TABLET | Freq: Three times a day (TID) | ORAL | Status: DC

## 2015-11-05 MED ORDER — ATORVASTATIN CALCIUM 20 MG PO TABS: 20.0000 mg | ORAL_TABLET | Freq: Every day | ORAL | Status: DC

## 2015-11-05 NOTE — Patient Instructions (Addendum)
Medication Instructions:  1. FINISH YOUR BOTTLE OF CARDIZEM CD 360 MG ; THEN GO TO # 2 2. START CARDIZEM CD 300 MG DAILY AFTER YOU FINISH THE BOTTLE OF THE 360 MG TABLET 3. RESTART LIPITOR 20 MG DAILY  Labwork: 12/05/15 FOR FASTING LIPID AND LIVER PANEL   Testing/Procedures: NONE  Follow-Up: Your physician wants you to follow-up in: 6 MONTHS  You will receive a reminder letter in the mail two months in advance. If you don't receive a letter, please call our office to schedule the follow-up appointment.  Any Other Special Instructions Will Be Listed Below (If Applicable).  If you need a refill on your cardiac medications before your next appointment, please call your pharmacy.

## 2015-11-07 ENCOUNTER — Other Ambulatory Visit: Payer: Self-pay | Admitting: Cardiology

## 2015-11-12 ENCOUNTER — Other Ambulatory Visit: Payer: Self-pay | Admitting: Internal Medicine

## 2015-11-26 ENCOUNTER — Ambulatory Visit: Payer: Managed Care, Other (non HMO) | Admitting: Nurse Practitioner

## 2015-11-27 ENCOUNTER — Ambulatory Visit: Payer: BLUE CROSS/BLUE SHIELD | Admitting: Physician Assistant

## 2016-01-19 ENCOUNTER — Other Ambulatory Visit: Payer: Self-pay

## 2016-01-19 NOTE — Telephone Encounter (Signed)
Sertraline to be refilled by PCP

## 2016-01-20 ENCOUNTER — Other Ambulatory Visit: Payer: Self-pay

## 2016-01-22 NOTE — Telephone Encounter (Signed)
Please sent to primary care 

## 2016-02-11 ENCOUNTER — Telehealth: Payer: Self-pay | Admitting: Physician Assistant

## 2016-02-11 DIAGNOSIS — I201 Angina pectoris with documented spasm: Secondary | ICD-10-CM

## 2016-02-11 MED ORDER — DILTIAZEM HCL ER COATED BEADS 300 MG PO CP24: 300.0000 mg | ORAL_CAPSULE | Freq: Every day | ORAL | Status: DC

## 2016-02-11 NOTE — Telephone Encounter (Signed)
New Message  Ptcalling to speak w/ RN about Rx changes made last OV- March/2017- pt has appt w/ [facility] for 05/19/2016. Please call back and discuss.

## 2016-02-11 NOTE — Telephone Encounter (Signed)
Spoke with pt. She has been busy with family obligations and has not been able to come in for lipid and liver profiles.  She will come in this Friday for these labs. Pt is asking about refill for Sertraline.  I told her we have requested this be sent to primary care.  She will contact Dr. Erick Blinks office to see if they will refill this. Pt asking about refills of other cardiac medications.  I told her these all have refills. She picked up Cardizem CD 360 mg yesterday. I told her this was decreased to 300 mg daily at last office visit.  I will send prescription for 300 [facility] to [facility] in [place].  She will check with pharmacy and see if she can return what she picked up yesterday as she has not opened it yet.

## 2016-02-12 ENCOUNTER — Other Ambulatory Visit (INDEPENDENT_AMBULATORY_CARE_PROVIDER_SITE_OTHER): Payer: BLUE CROSS/BLUE SHIELD | Admitting: *Deleted

## 2016-02-12 ENCOUNTER — Encounter: Payer: Self-pay | Admitting: Family Medicine

## 2016-02-12 ENCOUNTER — Ambulatory Visit (INDEPENDENT_AMBULATORY_CARE_PROVIDER_SITE_OTHER): Payer: BLUE CROSS/BLUE SHIELD | Admitting: Family Medicine

## 2016-02-12 VITALS — BP 136/80 | HR 98 | Temp 98.4°F | Ht 64.5 in | Wt 186.1 lb

## 2016-02-12 DIAGNOSIS — F3342 Major depressive disorder, recurrent, in full remission: Secondary | ICD-10-CM | POA: Diagnosis not present

## 2016-02-12 DIAGNOSIS — R059 Cough, unspecified: Secondary | ICD-10-CM

## 2016-02-12 DIAGNOSIS — E785 Hyperlipidemia, unspecified: Secondary | ICD-10-CM

## 2016-02-12 DIAGNOSIS — R05 Cough: Secondary | ICD-10-CM

## 2016-02-12 DIAGNOSIS — J310 Chronic rhinitis: Secondary | ICD-10-CM

## 2016-02-12 DIAGNOSIS — J329 Chronic sinusitis, unspecified: Secondary | ICD-10-CM | POA: Diagnosis not present

## 2016-02-12 MED ORDER — FLUTICASONE PROPIONATE 50 MCG/ACT NA SUSP: 2.0000 | Freq: Every day | NASAL | Status: DC

## 2016-02-12 MED ORDER — SERTRALINE HCL 50 MG PO TABS: 50.0000 mg | ORAL_TABLET | Freq: Every day | ORAL | Status: DC

## 2016-02-12 MED ORDER — BENZONATATE 100 MG PO CAPS: 100.0000 mg | ORAL_CAPSULE | Freq: Two times a day (BID) | ORAL | Status: DC | PRN

## 2016-02-12 MED ORDER — OXYMETAZOLINE HCL 0.05 % NA SOLN: 1.0000 | Freq: Two times a day (BID) | NASAL | Status: AC

## 2016-02-12 NOTE — Progress Notes (Signed)
HPI:  Rhinitis and cough: -started: 4-5 days ago -symptoms:nasal congestion, itchy nose and eyes, tickle in throat, sore throat, cough, sneezing, sinus pressure -denies:fever, SOB, NVD, tooth pain -has tried: alb a few times - reports she uses this for mild SOB when she gets a cold, but denies having asthma -sick contacts/travel/risks: no reported flu, strep or tick exposure -Hx of: allergies, ? Asthma  Depression: -stable on sertraline and requests refill -reports has not seen PCP in a long time, agrees to schedule fullow up as has skin lesion on thigh she wants to possibly remove  ROS: See pertinent positives and negatives per HPI.  Past Medical History  Diagnosis Date  . GERD (gastroesophageal reflux disease)   . Hyperlipidemia   . Hx of cholecystectomy   . Hx of appendectomy   . CAD (coronary artery disease)     has documented coronary vasospasm but with underlying 40% prox LAD, 30% prox LCX and 70 to 80% proximal 1st DX and 30% RCA; recurrent vasospasm with subsequent cath in Jan 2013.   Marland Kitchen Palpitations     tachycardia on holter; treated with beta blockers  . Coronary vasospasm ([place])   . Esophagitis   . IBS (irritable bowel syndrome)     Past Surgical History  Procedure Laterality Date  . Cholecystectomy  1982  . Appendectomy  1976  . Breast surgery  2002    biopsy  . Cardiac catheterization  2011  . Upper gastrointestinal endoscopy  01/11/2011    esophagitis  . Cardiac catheterization  Sept 2012    With documented vasospasm but with residual disease; managed medically  . Left heart catheterization with coronary angiogram N/A 09/14/2011    Procedure: LEFT HEART CATHETERIZATION WITH CORONARY ANGIOGRAM;  Surgeon: Burnell Blanks, MD;  Location: [facility] CATH LAB;  Service: Cardiovascular;  Laterality: N/A;    Family History  Problem Relation Age of Onset  . Breast cancer Mother 66  . Heart disease Mother     died CHF 15  . Diabetes Mother   . Prostate cancer  Father   . Hyperlipidemia Father   . Heart disease Father     s/p cabg - alive  . Colon cancer      First cousin on dads side     Social History   Social History  . Marital Status: Married    Spouse Name: N/A  . Number of Children: N/A  . Years of Education: N/A   Social History Main Topics  . Smoking status: Former Smoker -- 0.25 packs/day for 30 years    Types: Cigarettes    Quit date: 05/30/2014  . Smokeless tobacco: Never Used  . Alcohol Use: 1.2 oz/week    2 Standard drinks or equivalent per week     Comment: occ  . Drug Use: No  . Sexual Activity: Yes   Other Topics Concern  . None   Social History Narrative   Caffeine drinks 2 daily      Current outpatient prescriptions:  .  acetaminophen (TYLENOL) 500 MG tablet, Take 500 mg by mouth every 6 (six) hours as needed. For pain, Disp: , Rfl:  .  albuterol (PROVENTIL HFA;VENTOLIN HFA) 108 (90 Base) MCG/ACT inhaler, Inhale 2 puffs into the lungs every 4 (four) hours as needed for wheezing or shortness of breath., Disp: 1 Inhaler, Rfl: 2 .  aspirin 81 MG tablet, Take 81 mg by mouth daily.  , Disp: , Rfl:  .  atorvastatin (LIPITOR) 20 MG tablet, Take  1 tablet (20 mg total) by mouth daily., Disp: 90 tablet, Rfl: 3 .  diltiazem (CARDIZEM CD) 300 MG 24 hr capsule, Take 1 capsule (300 mg total) by mouth daily., Disp: 90 capsule, Rfl: 3 .  isosorbide dinitrate (ISORDIL) 30 MG tablet, Take 1 tablet (30 mg total) by mouth 3 (three) times daily., Disp: 90 tablet, Rfl: 3 .  nitroGLYCERIN (NITROSTAT) 0.4 MG SL tablet, Place 1 tablet (0.4 mg total) under the tongue every 5 (five) minutes as needed for chest pain., Disp: 25 tablet, Rfl: prn .  ranolazine (RANEXA) 500 MG 12 hr tablet, Take 1 tablet (500 mg total) by mouth 2 (two) times daily., Disp: 180 tablet, Rfl: 3 .  sertraline (ZOLOFT) 50 MG tablet, Take 1 tablet (50 mg total) by mouth daily., Disp: 90 tablet, Rfl: 3 .  benzonatate (TESSALON) 100 MG capsule, Take 1 capsule (100 mg  total) by mouth 2 (two) times daily as needed for cough., Disp: 20 capsule, Rfl: 0 .  fluticasone (FLONASE) 50 MCG/ACT nasal spray, Place 2 sprays into both nostrils daily., Disp: 16 g, Rfl: 0 .  oxymetazoline (AFRIN NASAL SPRAY) 0.05 % nasal spray, Place 1 spray into both nostrils 2 (two) times daily., Disp: 30 mL, Rfl: 0  EXAM:  Filed Vitals:   02/12/16 1021  BP: 136/80  Pulse: 98  Temp: 98.4 F (36.9 C)    Body mass index is 31.46 kg/(m^2).  GENERAL: vitals reviewed and listed above, alert, oriented, appears well hydrated and in no acute distress  HEENT: atraumatic, conjunttiva clear, no obvious abnormalities on inspection of external nose and ears, normal appearance of ear canals and TMs except for some soft wax in the right ear canal and clear effusion on the left, clear nasal congestion, boggy turbinates with narrow nasal passages, mild post oropharyngeal erythema with PND, no tonsillar edema or exudate, no sinus TTP  NECK: no obvious masses on inspection  LUNGS: clear to auscultation bilaterally, no wheezes, rales or rhonchi, good air movement  CV: HRRR, no peripheral edema  MS: moves all extremities without noticeable abnormality  PSYCH: pleasant and cooperative, no obvious depression or anxiety  ASSESSMENT AND PLAN:  Discussed the following assessment and plan:  Rhinosinusitis Cough -given HPI and exam findings today, a serious infection or illness is unlikely. We discussed potential etiologies, with allergic rhinitis and/or VURI being most likely was secondary eustachian tube dysfunction. We discussed treatment side effects, likely course, antibiotic misuse, transmission, and signs of developing a serious illness. Afrin nasal spray for 4 days. Discussed risks. Flonase. Tessalon as needed, she reports this has worked very well in the past. She is not sure if she has asthma. She uses albuterol occasionally and this fixes the mild shortness of breath that she reports she  always has with a cold or allergies. Continue as needed. -of course, we advised to return or notify a doctor immediately if symptoms worsen or persist or new concerns arise.  Recurrent major depressive disorder, in full remission ([place]) -1 month refill of her Zoloft provided -She has not been seen in some time by her PCP, advised routine follow-up with her PCP in 1 month and for evaluation of her skin lesion  Patient Instructions  BEFORE YOU LEAVE: -follow up: with PCP in about 1 month  Afrin nasal spray twice daily for 3-4 days, then stop. DO NOT use longer then 4 DAYS.  Flonase nasal spray 2 sprays each nostril daily for 21 days.  Tessalon and albuterol as needed per  instructions  Follow up if worsening, new concerns or not improving as expected      Thersea Manfredonia R.

## 2016-02-12 NOTE — Addendum Note (Signed)
Addended by: Lahoma Crocker A on: 02/12/2016 11:03 AM   Modules accepted: Orders

## 2016-02-12 NOTE — Addendum Note (Signed)
Addended by: Eulis Foster on: 02/12/2016 09:26 AM   Modules accepted: Orders

## 2016-02-12 NOTE — Progress Notes (Signed)
Pre visit review using our clinic review tool, if applicable. No additional management support is needed unless otherwise documented below in the visit note. 

## 2016-02-12 NOTE — Patient Instructions (Signed)
BEFORE YOU LEAVE: -follow up: with PCP in about 1 month  Afrin nasal spray twice daily for 3-4 days, then stop. DO NOT use longer then 4 DAYS.  Flonase nasal spray 2 sprays each nostril daily for 21 days.  Tessalon and albuterol as needed per instructions  Follow up if worsening, new concerns or not improving as expected

## 2016-02-13 LAB — HEPATIC FUNCTION PANEL
ALT: 29 U/L (ref 6–29)
AST: 23 U/L (ref 10–35)
Albumin: 4.3 g/dL (ref 3.6–5.1)
Alkaline Phosphatase: 86 U/L (ref 33–130)
BILIRUBIN TOTAL: 0.3 mg/dL (ref 0.2–1.2)
Bilirubin, Direct: 0.1 mg/dL (ref <=0.2)
Indirect Bilirubin: 0.2 mg/dL (ref 0.2–1.2)
TOTAL PROTEIN: 7.2 g/dL (ref 6.1–8.1)

## 2016-02-13 LAB — LIPID PANEL
CHOL/HDL RATIO: 2.3 ratio (ref <=5.0)
Cholesterol: 160 mg/dL (ref 125–200)
HDL: 69 mg/dL (ref >=46)
LDL Cholesterol: 76 mg/dL (ref <130)
TRIGLYCERIDES: 76 mg/dL (ref <150)
VLDL: 15 mg/dL (ref <30)

## 2016-03-05 ENCOUNTER — Other Ambulatory Visit: Payer: Self-pay | Admitting: Physician Assistant

## 2016-03-12 ENCOUNTER — Ambulatory Visit: Payer: BLUE CROSS/BLUE SHIELD | Admitting: Family Medicine

## 2016-03-17 ENCOUNTER — Ambulatory Visit (INDEPENDENT_AMBULATORY_CARE_PROVIDER_SITE_OTHER): Payer: BLUE CROSS/BLUE SHIELD | Admitting: Family Medicine

## 2016-03-17 VITALS — BP 110/70 | HR 113 | Temp 98.4°F | Ht 64.5 in | Wt 183.4 lb

## 2016-03-17 DIAGNOSIS — R400 Somnolence: Secondary | ICD-10-CM

## 2016-03-17 DIAGNOSIS — Z8659 Personal history of other mental and behavioral disorders: Secondary | ICD-10-CM | POA: Diagnosis not present

## 2016-03-17 DIAGNOSIS — R5383 Other fatigue: Secondary | ICD-10-CM | POA: Diagnosis not present

## 2016-03-17 DIAGNOSIS — M609 Myositis, unspecified: Secondary | ICD-10-CM

## 2016-03-17 DIAGNOSIS — G4710 Hypersomnia, unspecified: Secondary | ICD-10-CM | POA: Diagnosis not present

## 2016-03-17 DIAGNOSIS — R0683 Snoring: Secondary | ICD-10-CM

## 2016-03-17 DIAGNOSIS — M791 Myalgia: Secondary | ICD-10-CM

## 2016-03-17 DIAGNOSIS — IMO0001 Reserved for inherently not codable concepts without codable children: Secondary | ICD-10-CM

## 2016-03-17 LAB — BASIC METABOLIC PANEL
BUN: 16 mg/dL (ref 6–23)
CO2: 28 mEq/L (ref 19–32)
Calcium: 9.2 mg/dL (ref 8.4–10.5)
Chloride: 99 mEq/L (ref 96–112)
Creatinine, Ser: 0.77 mg/dL (ref 0.40–1.20)
GFR: 82.24 mL/min (ref >60.00)
Glucose, Bld: 91 mg/dL (ref 70–99)
Potassium: 4.3 mEq/L (ref 3.5–5.1)
Sodium: 135 mEq/L (ref 135–145)

## 2016-03-17 LAB — CBC WITH DIFFERENTIAL/PLATELET
BASOS ABS: 0 10*3/uL (ref 0.0–0.1)
Basophils Relative: 0.3 % (ref 0.0–3.0)
Eosinophils Absolute: 0.5 10*3/uL (ref 0.0–0.7)
Eosinophils Relative: 4.7 % (ref 0.0–5.0)
HEMATOCRIT: 43.3 % (ref 36.0–46.0)
HEMOGLOBIN: 14.6 g/dL (ref 12.0–15.0)
LYMPHS PCT: 30 % (ref 12.0–46.0)
Lymphs Abs: 2.9 10*3/uL (ref 0.7–4.0)
MCHC: 33.8 g/dL (ref 30.0–36.0)
MCV: 95.7 fl (ref 78.0–100.0)
MONOS PCT: 8.3 % (ref 3.0–12.0)
Monocytes Absolute: 0.8 10*3/uL (ref 0.1–1.0)
Neutro Abs: 5.4 10*3/uL (ref 1.4–7.7)
Neutrophils Relative %: 56.7 % (ref 43.0–77.0)
Platelets: 233 10*3/uL (ref 150.0–400.0)
RBC: 4.53 Mil/uL (ref 3.87–5.11)
RDW: 14.2 % (ref 11.5–15.5)
WBC: 9.6 10*3/uL (ref 4.0–10.5)

## 2016-03-17 LAB — VITAMIN D 25 HYDROXY (VIT D DEFICIENCY, FRACTURES): VITD: 27.97 ng/mL — ABNORMAL LOW (ref 30.00–100.00)

## 2016-03-17 LAB — TSH: TSH: 2.01 u[IU]/mL (ref 0.35–4.50)

## 2016-03-17 LAB — SEDIMENTATION RATE: Sed Rate: 28 mm/hr (ref 0–30)

## 2016-03-17 MED ORDER — SERTRALINE HCL 50 MG PO TABS: 50.0000 mg | ORAL_TABLET | Freq: Every day | ORAL | Status: DC

## 2016-03-17 NOTE — Progress Notes (Signed)
Subjective:     Patient ID: Caroline [name], female   DOB: Jul 19, 1961, 55 y.o.   MRN: [id number]  HPI Seen to follow-up several items Patient has diffuse myalgias. Previously stopped Lipitor but myalgias did not cease. She is concerned about possible fibromyalgia. Has generally poor sleep and has had for several years. Does have some arthralgias but mostly myalgias.  Never evaluated for sleep apnea. Snores frequently. No observed apnea but she states her husband is a deep sleeper. Usually wakes up feeling tired. No consistent exercise. Frequent daytime somnolence.  History of depression. Currently on sertraline and stable. Requesting refills.  Past Medical History  Diagnosis Date  . GERD (gastroesophageal reflux disease)   . Hyperlipidemia   . Hx of cholecystectomy   . Hx of appendectomy   . CAD (coronary artery disease)     has documented coronary vasospasm but with underlying 40% prox LAD, 30% prox LCX and 70 to 80% proximal 1st DX and 30% RCA; recurrent vasospasm with subsequent cath in Jan 2013.   Marland Kitchen Palpitations     tachycardia on holter; treated with beta blockers  . Coronary vasospasm ([place])   . Esophagitis   . IBS (irritable bowel syndrome)    Past Surgical History  Procedure Laterality Date  . Cholecystectomy  1982  . Appendectomy  1976  . Breast surgery  2002    biopsy  . Cardiac catheterization  2011  . Upper gastrointestinal endoscopy  01/11/2011    esophagitis  . Cardiac catheterization  Sept 2012    With documented vasospasm but with residual disease; managed medically  . Left heart catheterization with coronary angiogram N/A 09/14/2011    Procedure: LEFT HEART CATHETERIZATION WITH CORONARY ANGIOGRAM;  Surgeon: Burnell Blanks, MD;  Location: [facility] CATH LAB;  Service: Cardiovascular;  Laterality: N/A;    reports that she quit smoking about 21 months ago. Her smoking use included Cigarettes. She has a 7.5 pack-year smoking history. She has never used smokeless  tobacco. She reports that she drinks about 1.2 oz of alcohol per week. She reports that she does not use illicit drugs. family history includes Breast cancer (age of onset: 37) in her mother; Diabetes in her mother; Heart disease in her father and mother; Hyperlipidemia in her father; Prostate cancer in her father. No Known Allergies     Review of Systems  Constitutional: Positive for fatigue. Negative for appetite change and unexpected weight change.  Respiratory: Negative for shortness of breath.   Cardiovascular: Negative for chest pain.  Gastrointestinal: Negative for abdominal pain.  Musculoskeletal: Positive for myalgias.       Objective:   Physical Exam  Constitutional: She appears well-developed and well-nourished. No distress.  Neck: Neck supple. No thyromegaly present.  Cardiovascular: Normal rate and regular rhythm.   Pulmonary/Chest: Effort normal and breath sounds normal. No respiratory distress. She has no wheezes. She has no rales.  Musculoskeletal: She exhibits no edema.  She has multiple tender points over classic fibromyalgia trigger points and these are very symmetric       Assessment:     #1 myalgias. Not improved with cessation of statin. Clinically, suspect fibromyalgia. Rule out vitamin D deficiency or hypothyroidism  #2 history of recurrent depression currently stable on sertraline  #3 frequent daytime somnolence and progressive fatigue. Rule out obstructive sleep apnea.  Epworth Sleepiness Scale score of 16.    Plan:     -Check further labs with vitamin D level, TSH, sedimentation rate, CBC -We discussed possible change from  sertraline to Cymbalta but will wait on lab work first -Epworth Sleepiness Scale score of 16. Set up sleep study to further evaluate  Eulas Post MD [facility]

## 2016-03-17 NOTE — Progress Notes (Signed)
Pre visit review using our clinic review tool, if applicable. No additional management support is needed unless otherwise documented below in the visit note. 

## 2016-03-17 NOTE — Patient Instructions (Signed)
We will set up sleep study to rule out sleep apnea.

## 2016-03-18 MED ORDER — VITAMIN D 50 MCG (2000 UT) PO CAPS: ORAL_CAPSULE | ORAL | Status: AC

## 2016-03-18 NOTE — Addendum Note (Signed)
Addended by: Elio Forget on: 03/18/2016 10:02 AM   Modules accepted: Orders

## 2016-03-23 DIAGNOSIS — D225 Melanocytic nevi of trunk: Secondary | ICD-10-CM | POA: Diagnosis not present

## 2016-05-17 DIAGNOSIS — Z23 Encounter for immunization: Secondary | ICD-10-CM | POA: Diagnosis not present

## 2016-05-18 NOTE — Progress Notes (Signed)
Chief Complaint  Patient presents with  . Leg Pain    History of Present Illness: 55 yo female with history of CAD, Prinzmetal angina, HLD, IBS, GERD who is here today for cardiac follow up. She had been followed by Dr. Mare Ferrari prior to his retirement. She has Prinzmetal angina controlled with Diltiazem, Isordil and Ranexa. Last cardiac cath in January 2013 at which time she was noted to have a intense spasm in the ostial LAD with mild calcification, ostial Diagonal 50% stenosis, 20% mid Circumflex stenosis, 30% OM stenosis, 30% mid RCA stenosis. LVEF 55% by cath. No recent echo.   She is here today for follow up. She denies SOB. She has rare chest pains. She is very active and walks daily. She has recently changed her diet and has lost some weight. She does describe weakness and pain both legs. Worse when she wakes up. Does not hurt more when she walks.   Primary Care Physician: Eulas Post, MD  Past Medical History:  Diagnosis Date  . CAD (coronary artery disease)    has documented coronary vasospasm but with underlying 40% prox LAD, 30% prox LCX and 70 to 80% proximal 1st DX and 30% RCA; recurrent vasospasm with subsequent cath in Jan 2013.   Marland Kitchen Coronary vasospasm ([place])   . Esophagitis   . GERD (gastroesophageal reflux disease)   . Hyperlipidemia   . IBS (irritable bowel syndrome)   . Palpitations    tachycardia on holter; treated with beta blockers    Past Surgical History:  Procedure Laterality Date  . APPENDECTOMY  1976  . BREAST SURGERY  2002   biopsy  . CARDIAC CATHETERIZATION  2011  . CARDIAC CATHETERIZATION  Sept 2012   With documented vasospasm but with residual disease; managed medically  . CHOLECYSTECTOMY  1982  . LEFT HEART CATHETERIZATION WITH CORONARY ANGIOGRAM N/A 09/14/2011   Procedure: LEFT HEART CATHETERIZATION WITH CORONARY ANGIOGRAM;  Surgeon: Burnell Blanks, MD;  Location: [facility] CATH LAB;  Service: Cardiovascular;  Laterality: N/A;  . UPPER  GASTROINTESTINAL ENDOSCOPY  01/11/2011   esophagitis    Current Outpatient Prescriptions  Medication Sig Dispense Refill  . acetaminophen (TYLENOL) 500 MG tablet Take 500 mg by mouth every 6 (six) hours as needed. For pain    . albuterol (PROVENTIL HFA;VENTOLIN HFA) 108 (90 Base) MCG/ACT inhaler Inhale 2 puffs into the lungs every 4 (four) hours as needed for wheezing or shortness of breath. 1 Inhaler 2  . aspirin 81 MG tablet Take 81 mg by mouth daily.      Marland Kitchen atorvastatin (LIPITOR) 20 MG tablet Take 1 tablet (20 mg total) by mouth daily. 90 tablet 3  . Cholecalciferol (VITAMIN D) 2000 units CAPS Take 2000 units once per day. 30 capsule   . diltiazem (CARDIZEM CD) 300 MG 24 hr capsule Take 1 capsule (300 mg total) by mouth daily. 90 capsule 3  . fluticasone (FLONASE) 50 MCG/ACT nasal spray Place 2 sprays into both nostrils daily. 16 g 0  . isosorbide dinitrate (ISORDIL) 30 MG tablet TAKE 1 TABLET (30 MG TOTAL) BY MOUTH 3 (THREE) TIMES DAILY. 90 tablet 5  . nitroGLYCERIN (NITROSTAT) 0.4 MG SL tablet Place 1 tablet (0.4 mg total) under the tongue every 5 (five) minutes as needed for chest pain. 25 tablet prn  . ranolazine (RANEXA) 500 MG 12 hr tablet Take 1 tablet (500 mg total) by mouth 2 (two) times daily. 180 tablet 3  . sertraline (ZOLOFT) 50 MG tablet Take 1  tablet (50 mg total) by mouth daily. 90 tablet 3   No current facility-administered medications for this visit.     No Known Allergies  Social History   Social History  . Marital status: Married    Spouse name: N/A  . Number of children: 2  . Years of education: N/A   Occupational History  . Unemployed    Social History Main Topics  . Smoking status: Former Smoker    Packs/day: 0.25    Years: 30.00    Types: Cigarettes    Quit date: 05/30/2014  . Smokeless tobacco: Never Used  . Alcohol use 1.2 oz/week    2 Standard drinks or equivalent per week     Comment: occ  . Drug use: No  . Sexual activity: Yes   Other  Topics Concern  . Not on file   Social History Narrative   Caffeine drinks 2 daily     Family History  Problem Relation Age of Onset  . Breast cancer Mother 18  . Heart disease Mother     died CHF 110  . Diabetes Mother   . Prostate cancer Father   . Hyperlipidemia Father   . Heart disease Father     s/p cabg - alive  . Colon cancer      First cousin on dads side     Review of Systems:  As stated in the HPI and otherwise negative.   BP 106/76 (BP Location: Right Arm, Patient Position: Sitting, Cuff Size: Normal)   Pulse 100   Ht 5' 4.5" (1.638 m)   Physical Examination: General: Well developed, well nourished, NAD  HEENT: OP clear, mucus membranes moist  SKIN: warm, dry. No rashes. Neuro: No focal deficits  Musculoskeletal: Muscle strength 5/5 all ext  Psychiatric: Mood and affect normal  Neck: No JVD, no carotid bruits, no thyromegaly, no lymphadenopathy.  Lungs:Clear bilaterally, no wheezes, rhonci, crackles Cardiovascular: Regular rate and rhythm. No murmurs, gallops or rubs. Abdomen:Soft. Bowel sounds present. Non-tender.  Extremities: No lower extremity edema. Pulses are 2 + in the bilateral DP/PT.  Cardiac cath September 12, 2011: Left main: No obstructive disease. As noted above, pt with intense vasospasm at beginning of case.  Left Anterior Descending Artery: Ostial spasm however after NTG given, there is minimal obstructive disease in the ostium with mild calcification. The diagonal has ostial 50% stenosis, unchanged from previous cath.  Circumflex Artery: During initial injections, there is a 99% mid stenosis, however, after NTG given this area responded well with minimal 20% stenosis. The proximal portion of the OM 1 also had 30% stenosis.  Right Coronary Artery: Large, dominant vessel with 30% mid stenosis.  Left Ventricular Angiogram: LVEF=55%.   EKG:  EKG is not ordered today. The ekg ordered today demonstrates   Recent Labs: 02/12/2016: ALT 29 03/17/2016:  BUN 16; Creatinine, Ser 0.77; Hemoglobin 14.6; Platelets 233.0; Potassium 4.3; Sodium 135; TSH 2.01   Lipid Panel    Component Value Date/Time   CHOL 160 02/12/2016 0926   TRIG 76 02/12/2016 0926   HDL 69 02/12/2016 0926   CHOLHDL 2.3 02/12/2016 0926   VLDL 15 02/12/2016 0926   LDLCALC 76 02/12/2016 0926     Wt Readings from Last 3 Encounters:  03/17/16 83.2 kg (183 lb 6.4 oz)  02/12/16 84.4 kg (186 lb 1.6 oz)  11/05/15 84.8 kg (187 lb)     Other studies Reviewed: Additional studies/ records that were reviewed today include: . Review of the above records  demonstrates:   Assessment and Plan:   1. CAD/Coronary vasospasm: She has moderate CAD by cath in 2013 with documented coronary vasospasm. Will continue Ranexa, Isordil and Diltiazem. She is also on a statin.  2. Hyperlipidemia: Will continue statin. LDL is at goal.  She has some muscle weakness in her legs. Will cut Lipitor back to 10 mg daily.   3. Leg pain: she has palpable PT pulses. She does have weakness and pain in both legs but no cramping in calf muscles when she walks. Will arrange LE arterial dopplers.     Current medicines are reviewed at length with the patient today.  The patient does not have concerns regarding medicines.  The following changes have been made:  no change  Labs/ tests ordered today include:  No orders of the defined types were placed in this encounter.   Disposition:   FU with me in 12 months  Signed, Lauree Chandler, MD 05/19/2016 10:17 AM    [facility] [address], [place], [place]  [address] Phone: [phone]; Fax: [phone]

## 2016-05-19 ENCOUNTER — Encounter: Payer: Self-pay | Admitting: Cardiovascular Disease

## 2016-05-19 ENCOUNTER — Ambulatory Visit (INDEPENDENT_AMBULATORY_CARE_PROVIDER_SITE_OTHER): Payer: BLUE CROSS/BLUE SHIELD | Admitting: Cardiovascular Disease

## 2016-05-19 ENCOUNTER — Encounter (INDEPENDENT_AMBULATORY_CARE_PROVIDER_SITE_OTHER): Payer: Self-pay

## 2016-05-19 VITALS — BP 106/76 | HR 100 | Ht 64.5 in

## 2016-05-19 DIAGNOSIS — E785 Hyperlipidemia, unspecified: Secondary | ICD-10-CM | POA: Diagnosis not present

## 2016-05-19 DIAGNOSIS — M79604 Pain in right leg: Secondary | ICD-10-CM

## 2016-05-19 DIAGNOSIS — I201 Angina pectoris with documented spasm: Secondary | ICD-10-CM | POA: Diagnosis not present

## 2016-05-19 DIAGNOSIS — M79605 Pain in left leg: Secondary | ICD-10-CM

## 2016-05-19 DIAGNOSIS — I2510 Atherosclerotic heart disease of native coronary artery without angina pectoris: Secondary | ICD-10-CM | POA: Diagnosis not present

## 2016-05-19 NOTE — Patient Instructions (Signed)
Medication Instructions:  Your physician recommends that you continue on your current medications as directed. Please refer to the Current Medication list given to you today.   Labwork: none  Testing/Procedures: Your physician has requested that you have a lower or upper extremity arterial duplex. This test is an ultrasound of the arteries in the legs or arms. It looks at arterial blood flow in the legs and arms. Allow one hour for Lower and Upper Arterial scans. There are no restrictions or special instructions   Follow-Up: Your physician wants you to follow-up in: 12 months.  You will receive a reminder letter in the mail two months in advance. If you don't receive a letter, please call our office to schedule the follow-up appointment.   Any Other Special Instructions Will Be Listed Below (If Applicable).     If you need a refill on your cardiac medications before your next appointment, please call your pharmacy.   

## 2016-05-20 ENCOUNTER — Other Ambulatory Visit: Payer: Self-pay | Admitting: Cardiovascular Disease

## 2016-05-20 DIAGNOSIS — I739 Peripheral vascular disease, unspecified: Secondary | ICD-10-CM

## 2016-05-25 ENCOUNTER — Ambulatory Visit (HOSPITAL_COMMUNITY)
Admission: RE | Admit: 2016-05-25 | Discharge: 2016-05-25 | Disposition: A | Discharge status: Home / Self Care | Payer: BLUE CROSS/BLUE SHIELD | Source: Ambulatory Visit | Attending: Cardiovascular Disease | Admitting: Cardiovascular Disease

## 2016-05-25 DIAGNOSIS — M79605 Pain in left leg: Secondary | ICD-10-CM | POA: Diagnosis not present

## 2016-05-25 DIAGNOSIS — M79604 Pain in right leg: Secondary | ICD-10-CM | POA: Diagnosis not present

## 2016-05-25 DIAGNOSIS — I739 Peripheral vascular disease, unspecified: Secondary | ICD-10-CM

## 2016-05-28 ENCOUNTER — Other Ambulatory Visit: Payer: Self-pay | Admitting: Obstetrics

## 2016-05-28 DIAGNOSIS — Z1231 Encounter for screening mammogram for malignant neoplasm of breast: Secondary | ICD-10-CM

## 2016-05-30 ENCOUNTER — Other Ambulatory Visit: Payer: Self-pay | Admitting: Family Medicine

## 2016-05-31 NOTE — Telephone Encounter (Signed)
Refill OK

## 2016-06-02 ENCOUNTER — Ambulatory Visit
Admission: RE | Admit: 2016-06-02 | Discharge: 2016-06-02 | Disposition: A | Discharge status: Home / Self Care | Payer: BLUE CROSS/BLUE SHIELD | Source: Ambulatory Visit | Attending: Obstetrics | Admitting: Obstetrics

## 2016-06-02 DIAGNOSIS — Z1231 Encounter for screening mammogram for malignant neoplasm of breast: Secondary | ICD-10-CM | POA: Diagnosis not present

## 2016-08-24 ENCOUNTER — Telehealth: Payer: Self-pay | Admitting: *Deleted

## 2016-08-24 MED ORDER — RANOLAZINE ER 500 MG PO TB12: 500.0000 mg | ORAL_TABLET | Freq: Two times a day (BID) | ORAL | 2 refills | Status: DC

## 2016-08-24 NOTE — Telephone Encounter (Signed)
Refills sent

## 2016-09-02 ENCOUNTER — Other Ambulatory Visit: Payer: Self-pay | Admitting: *Deleted

## 2016-09-02 DIAGNOSIS — R1013 Epigastric pain: Secondary | ICD-10-CM

## 2016-09-02 DIAGNOSIS — E7800 Pure hypercholesterolemia, unspecified: Secondary | ICD-10-CM

## 2016-09-02 DIAGNOSIS — I201 Angina pectoris with documented spasm: Secondary | ICD-10-CM

## 2016-09-02 MED ORDER — NITROGLYCERIN 0.4 MG SL SUBL: 0.4000 mg | SUBLINGUAL_TABLET | SUBLINGUAL | 1 refills | Status: DC | PRN

## 2016-09-09 ENCOUNTER — Other Ambulatory Visit: Payer: Self-pay | Admitting: *Deleted

## 2016-09-09 MED ORDER — ISOSORBIDE DINITRATE 30 MG PO TABS: ORAL_TABLET | ORAL | 7 refills | Status: DC

## 2016-09-18 ENCOUNTER — Encounter: Payer: Self-pay | Admitting: Emergency Medicine

## 2016-09-18 ENCOUNTER — Emergency Department
Admission: EM | Admit: 2016-09-18 | Discharge: 2016-09-18 | Disposition: A | Discharge status: Home / Self Care | Payer: BLUE CROSS/BLUE SHIELD | Attending: Student in an Organized Health Care Education/Training Program | Admitting: Student in an Organized Health Care Education/Training Program

## 2016-09-18 ENCOUNTER — Emergency Department: Payer: BLUE CROSS/BLUE SHIELD

## 2016-09-18 DIAGNOSIS — R269 Unspecified abnormalities of gait and mobility: Secondary | ICD-10-CM | POA: Diagnosis not present

## 2016-09-18 DIAGNOSIS — Z79899 Other long term (current) drug therapy: Secondary | ICD-10-CM | POA: Insufficient documentation

## 2016-09-18 DIAGNOSIS — Z7982 Long term (current) use of aspirin: Secondary | ICD-10-CM | POA: Insufficient documentation

## 2016-09-18 DIAGNOSIS — R42 Dizziness and giddiness: Secondary | ICD-10-CM | POA: Insufficient documentation

## 2016-09-18 DIAGNOSIS — I2510 Atherosclerotic heart disease of native coronary artery without angina pectoris: Secondary | ICD-10-CM | POA: Insufficient documentation

## 2016-09-18 DIAGNOSIS — R079 Chest pain, unspecified: Secondary | ICD-10-CM | POA: Diagnosis not present

## 2016-09-18 DIAGNOSIS — I10 Essential (primary) hypertension: Secondary | ICD-10-CM | POA: Insufficient documentation

## 2016-09-18 DIAGNOSIS — Z87891 Personal history of nicotine dependence: Secondary | ICD-10-CM | POA: Insufficient documentation

## 2016-09-18 HISTORY — DX: Essential (primary) hypertension: I10

## 2016-09-18 LAB — BASIC METABOLIC PANEL
ANION GAP: 7 (ref 5–15)
BUN: 11 mg/dL (ref 6–20)
CHLORIDE: 102 mmol/L (ref 101–111)
CO2: 28 mmol/L (ref 22–32)
Calcium: 9.6 mg/dL (ref 8.9–10.3)
Creatinine, Ser: 0.69 mg/dL (ref 0.44–1.00)
GFR calc Af Amer: >60 mL/min (ref >60)
Glucose, Bld: 99 mg/dL (ref 65–99)
POTASSIUM: 4.1 mmol/L (ref 3.5–5.1)
SODIUM: 137 mmol/L (ref 135–145)

## 2016-09-18 LAB — CBC
HEMATOCRIT: 45.4 % (ref 35.0–47.0)
HEMOGLOBIN: 15.7 g/dL (ref 12.0–16.0)
MCH: 33.4 pg (ref 26.0–34.0)
MCHC: 34.5 g/dL (ref 32.0–36.0)
MCV: 96.9 fL (ref 80.0–100.0)
Platelets: 196 10*3/uL (ref 150–440)
RBC: 4.69 MIL/uL (ref 3.80–5.20)
RDW: 13.5 % (ref 11.5–14.5)
WBC: 6.4 10*3/uL (ref 3.6–11.0)

## 2016-09-18 LAB — TROPONIN I: Troponin I: <0.03 ng/mL (ref <0.03)

## 2016-09-18 MED ORDER — IOPAMIDOL (ISOVUE-370) INJECTION 76%
125.0000 mL | Freq: Once | INTRAVENOUS | Status: AC | PRN
  Administered 2016-09-18: 125 mL via INTRAVENOUS

## 2016-09-18 MED ORDER — IPRATROPIUM-ALBUTEROL 0.5-2.5 (3) MG/3ML IN SOLN
3.0000 mL | Freq: Once | RESPIRATORY_TRACT | Status: AC
  Administered 2016-09-18: 3 mL via RESPIRATORY_TRACT
  Filled 2016-09-18: qty 3

## 2016-09-18 MED ORDER — MECLIZINE HCL 25 MG PO TABS: 25.0000 mg | ORAL_TABLET | Freq: Three times a day (TID) | ORAL | 0 refills | Status: DC | PRN

## 2016-09-18 MED ORDER — SODIUM CHLORIDE 0.9 % IV BOLUS (SEPSIS)
1000.0000 mL | Freq: Once | INTRAVENOUS | Status: AC
  Administered 2016-09-18: 1000 mL via INTRAVENOUS

## 2016-09-18 MED ORDER — MECLIZINE HCL 25 MG PO TABS
25.0000 mg | ORAL_TABLET | Freq: Once | ORAL | Status: AC
  Administered 2016-09-18: 25 mg via ORAL
  Filled 2016-09-18: qty 1

## 2016-09-18 NOTE — ED Notes (Signed)
Pt returned from CT at this time.  

## 2016-09-18 NOTE — ED Notes (Signed)
Report given to Rachel, RN.

## 2016-09-18 NOTE — ED Provider Notes (Signed)
[facility] Emergency Department Provider Note    First MD Initiated Contact with Patient 09/18/16 1805     (approximate)  I have reviewed the triage vital signs and the nursing notes.   HISTORY  Chief Complaint Weakness (Pt. states weakness with lt. arm and lt. neck pain.)    HPI Lurine Mcnealy is a 56 y.o. female presents with chief complaint of lightheadedness and dizziness associated with chest pain radiating to left shoulder and jaw that occurred when the patient was getting out of her car and walking in the store. There was associated generalized weakness. Patient went to the store and sat down with some improvement in symptoms. She was checked on by a registered nurse it happened to be in the store with her. At that point he called EMS due to her acute symptoms. She was given nitroglycerin and aspirin in route. She arrives with no chest pain, shoulder pain or neck pain. No numbness or tingling. Does still feel weak and feels lightheaded upon raising from a seated or lying down position. Denies any nausea or vomiting. Has had a history of MI but states that the symptoms are different. Denied any diaphoresis. No palpitations.   Past Medical History:  Diagnosis Date  . CAD (coronary artery disease)    has documented coronary vasospasm but with underlying 40% prox LAD, 30% prox LCX and 70 to 80% proximal 1st DX and 30% RCA; recurrent vasospasm with subsequent cath in Jan 2013.   Marland Kitchen Coronary vasospasm ([place])   . Esophagitis   . GERD (gastroesophageal reflux disease)   . Hyperlipidemia   . Hypertension   . IBS (irritable bowel syndrome)   . Palpitations    tachycardia on holter; treated with beta blockers   Family History  Problem Relation Age of Onset  . Breast cancer Mother 44  . Heart disease Mother     died CHF 41  . Diabetes Mother   . Prostate cancer Father   . Hyperlipidemia Father   . Heart disease Father     s/p cabg - alive  . Colon cancer       First cousin on dads side    Past Surgical History:  Procedure Laterality Date  . APPENDECTOMY  1976  . BREAST SURGERY  2002   biopsy  . CARDIAC CATHETERIZATION  2011  . CARDIAC CATHETERIZATION  Sept 2012   With documented vasospasm but with residual disease; managed medically  . CHOLECYSTECTOMY  1982  . LEFT HEART CATHETERIZATION WITH CORONARY ANGIOGRAM N/A 09/14/2011   Procedure: LEFT HEART CATHETERIZATION WITH CORONARY ANGIOGRAM;  Surgeon: Burnell Blanks, MD;  Location: [facility] CATH LAB;  Service: Cardiovascular;  Laterality: N/A;  . UPPER GASTROINTESTINAL ENDOSCOPY  01/11/2011   esophagitis   Patient Active Problem List   Diagnosis Date Noted  . Intrinsic asthma 08/06/2014  . Dyslipidemia 02/13/2014  . Closed fracture of right humerus with routine healing 02/06/2013  . Bronchitis 09/29/2011  . Constipation, acute 09/17/2011  . IBS (irritable bowel syndrome) 09/15/2011  . Nonspecific elevation of levels of transaminase or lactic acid dehydrogenase (LDH) 09/15/2011  . Coronary vasospasm ([place])   . Tachycardia 07/28/2011  . History of esophagitis 07/28/2011  . Chest pain 07/12/2011  . CAD (coronary artery disease) 05/28/2011  . Abdominal pain, epigastric 12/04/2010  . Diarrhea 12/04/2010      Prior to Admission medications   Medication Sig Start Date End Date Taking? Authorizing Provider  acetaminophen (TYLENOL) 500 MG tablet Take 500 mg  by mouth every 6 (six) hours as needed. For pain    Historical Provider, MD  albuterol (PROVENTIL HFA;VENTOLIN HFA) 108 (90 Base) MCG/ACT inhaler Inhale 2 puffs into the lungs every 4 (four) hours as needed for wheezing or shortness of breath. 09/25/15   Eulas Post, MD  aspirin 81 MG tablet Take 81 mg by mouth daily.      Historical Provider, MD  atorvastatin (LIPITOR) 20 MG tablet Take 1 tablet (20 mg total) by mouth daily. 11/05/15   Scott Joylene Draft, PA-C  benzonatate (TESSALON) 100 MG capsule TAKE 1 CAPSULE (100 MG TOTAL) BY  MOUTH 2 (TWO) TIMES DAILY AS NEEDED FOR COUGH. 05/31/16   Eulas Post, MD  Cholecalciferol (VITAMIN D) 2000 units CAPS Take 2000 units once per day. 03/18/16   Eulas Post, MD  diltiazem (CARDIZEM CD) 300 MG 24 hr capsule Take 1 capsule (300 mg total) by mouth daily. 02/11/16   Burnell Blanks, MD  fluticasone (FLONASE) 50 MCG/ACT nasal spray Place 2 sprays into both nostrils daily. 02/12/16   Lucretia Kern, DO  isosorbide dinitrate (ISORDIL) 30 MG tablet TAKE 1 TABLET (30 MG TOTAL) BY MOUTH 3 (THREE) TIMES DAILY. 09/09/16   Burnell Blanks, MD  meclizine (ANTIVERT) 25 MG tablet Take 1 tablet (25 mg total) by mouth 3 (three) times daily as needed. 09/18/16   Merlyn Lot, MD  nitroGLYCERIN (NITROSTAT) 0.4 MG SL tablet Place 1 tablet (0.4 mg total) under the tongue every 5 (five) minutes as needed for chest pain. 09/02/16   Burnell Blanks, MD  ranolazine (RANEXA) 500 MG 12 hr tablet Take 1 tablet (500 mg total) by mouth 2 (two) times daily. 08/24/16   Burnell Blanks, MD  sertraline (ZOLOFT) 50 MG tablet Take 1 tablet (50 mg total) by mouth daily. 03/17/16   Eulas Post, MD    Allergies Patient has no known allergies.    Social History Social History  Substance Use Topics  . Smoking status: Former Smoker    Packs/day: 0.25    Years: 30.00    Types: Cigarettes    Quit date: 05/30/2014  . Smokeless tobacco: Never Used  . Alcohol use 1.2 oz/week    2 Standard drinks or equivalent per week     Comment: occ    Review of Systems Patient denies headaches, rhinorrhea, blurry vision, numbness, shortness of breath, chest pain, edema, cough, abdominal pain, nausea, vomiting, diarrhea, dysuria, fevers, rashes or hallucinations unless otherwise stated above in HPI. ____________________________________________   PHYSICAL EXAM:  VITAL SIGNS: Vitals:   09/18/16 2045 09/18/16 2100  BP: 127/74 118/80  Pulse: 60 60  Resp: 17 16  Temp:      Constitutional: Alert and oriented. Well appearing and in no acute distress. Eyes: Conjunctivae are normal. PERRL. EOMI. Head: Atraumatic. Nose: No congestion/rhinnorhea. Mouth/Throat: Mucous membranes are moist.  Oropharynx non-erythematous. Neck: No stridor. Painless ROM. No cervical spine tenderness to palpation Hematological/Lymphatic/Immunilogical: No cervical lymphadenopathy. Cardiovascular: Normal rate, regular rhythm. Grossly normal heart sounds.  Good peripheral circulation. Respiratory: Normal respiratory effort.  No retractions. Lungs with faint expiratory wheezes bilaterally Gastrointestinal: Soft and nontender. No distention. No abdominal bruits. No CVA tenderness. Musculoskeletal: No lower extremity tenderness nor edema.  No joint effusions. Neurologic:  CN- intact.  No facial droop, Normal FNF.  Normal heel to shin.  Sensation intact bilaterally. Normal speech and language. No gross focal neurologic deficits are appreciated. No gait instability. Skin:  Skin is warm, dry and  intact. No rash noted. Psychiatric: Mood and affect are normal. Speech and behavior are normal.  ____________________________________________   LABS (all labs ordered are listed, but only abnormal results are displayed)  Results for orders placed or performed during the hospital encounter of 09/18/16 (from the past 24 hour(s))  Basic metabolic panel     Status: None   Collection Time: 09/18/16  3:15 PM  Result Value Ref Range   Sodium 137 135 - 145 mmol/L   Potassium 4.1 3.5 - 5.1 mmol/L   Chloride 102 101 - 111 mmol/L   CO2 28 22 - 32 mmol/L   Glucose, Bld 99 65 - 99 mg/dL   BUN 11 6 - 20 mg/dL   Creatinine, Ser 0.69 0.44 - 1.00 mg/dL   Calcium 9.6 8.9 - 10.3 mg/dL   GFR calc non Af Amer >60 >60 mL/min   GFR calc Af Amer >60 >60 mL/min   Anion gap 7 5 - 15  CBC     Status: None   Collection Time: 09/18/16  3:15 PM  Result Value Ref Range   WBC 6.4 3.6 - 11.0 K/uL   RBC 4.69 3.80 - 5.20  MIL/uL   Hemoglobin 15.7 12.0 - 16.0 g/dL   HCT 45.4 35.0 - 47.0 %   MCV 96.9 80.0 - 100.0 fL   MCH 33.4 26.0 - 34.0 pg   MCHC 34.5 32.0 - 36.0 g/dL   RDW 13.5 11.5 - 14.5 %   Platelets 196 150 - 440 K/uL  Troponin I     Status: None   Collection Time: 09/18/16  3:15 PM  Result Value Ref Range   Troponin I <0.03 <0.03 ng/mL  Troponin I     Status: None   Collection Time: 09/18/16  8:56 PM  Result Value Ref Range   Troponin I <0.03 <0.03 ng/mL   ____________________________________________  EKG My review and personal interpretation at Time: 15:10   Indication: weakness  Rate: 70  Rhythm: sinus Axis: normal Other: non specific st changes ____________________________________________  RADIOLOGY  I personally reviewed all radiographic images ordered to evaluate for the above acute complaints and reviewed radiology reports and findings.  These findings were personally discussed with the patient.  Please see medical record for radiology report. ____________________________________________   PROCEDURES  Procedure(s) performed:  Procedures    Critical Care performed: no ____________________________________________   INITIAL IMPRESSION / ASSESSMENT AND PLAN / ED COURSE  Pertinent labs & imaging results that were available during my care of the patient were reviewed by me and considered in my medical decision making (see chart for details).  DDX: vertigo, orthostasis, dehydration, pna, dissection, acs, dysrhythmia  Caroline [name] is a 56 y.o. who presents to the ED with symptoms as described above. Patient arrives afebrile and hemodynamically stable. Her EKG shows no evidence of acute ischemia and her troponin is negative. She has no focal neuro deficits at this time. Possible TIA possible dehydration. Patient does have risk factors for ACS with documented vasospasm. We will further risk stratify with repeat troponin. We'll order orthostatic vital signs. We'll provide IV fluids.  Will check CT scan due to concern for dissection. We'll also evaluate for evidence of vertebral dissection or CVA.  Clinical Course as of Sep 18 2145  Sat Sep 18, 2016  2123 Patient received meclizine with improvement in symptoms. Do suspect some component of vertigo. Less consistent with ACS as the patient's primary concern was dizziness with subsequent left arm pain. No evidence of dye C  C-section. CT imaging reviewed with patient. EKG is normal. Awaiting repeat troponin. No evidence of orthostasis.  [PR]  2143 Repeat trop negative.  Patient asymptomatic.  In NAD.  Do feel patietn is stable for close outpatient follow up.  Patient was able to tolerate PO and was able to ambulate with a steady gait.  Have discussed with the patient and available family all diagnostics and treatments performed thus far and all questions were answered to the best of my ability. The patient demonstrates understanding and agreement with plan.   [PR]    Clinical Course User Index [PR] Merlyn Lot, MD      ____________________________________________   FINAL CLINICAL IMPRESSION(S) / ED DIAGNOSES  Final diagnoses:  Dizziness  Chest pain, unspecified type      NEW MEDICATIONS STARTED DURING THIS VISIT:  New Prescriptions   MECLIZINE (ANTIVERT) 25 MG TABLET    Take 1 tablet (25 mg total) by mouth 3 (three) times daily as needed.     Note:  This document was prepared using Dragon voice recognition software and may include unintentional dictation errors.    Merlyn Lot, MD 09/18/16 2147

## 2016-09-18 NOTE — ED Notes (Signed)
Pt. States she took 324 asa and 1 nitro.

## 2016-09-18 NOTE — ED Triage Notes (Signed)
Pt. States that around 2 pm today pt. Felt weakness when getting out of car. Pt. States dull pain to lt. Side of face and shoulder.

## 2016-09-18 NOTE — Discharge Instructions (Signed)
Return to ER for any increase in pain, if the pain changes or becomes worse with physical activity, you have shortness of breath, nausea or vomiting associated with the chest pain. ° °

## 2016-09-23 ENCOUNTER — Encounter: Payer: Self-pay | Admitting: Cardiovascular Disease

## 2016-09-23 ENCOUNTER — Ambulatory Visit (INDEPENDENT_AMBULATORY_CARE_PROVIDER_SITE_OTHER): Payer: BLUE CROSS/BLUE SHIELD | Admitting: Cardiovascular Disease

## 2016-09-23 VITALS — BP 122/72 | HR 90 | Ht 64.0 in | Wt 177.4 lb

## 2016-09-23 DIAGNOSIS — I25110 Atherosclerotic heart disease of native coronary artery with unstable angina pectoris: Secondary | ICD-10-CM | POA: Diagnosis not present

## 2016-09-23 DIAGNOSIS — E785 Hyperlipidemia, unspecified: Secondary | ICD-10-CM | POA: Diagnosis not present

## 2016-09-23 NOTE — Patient Instructions (Signed)
Medication Instructions:   Your physician recommends that you continue on your current medications as directed. Please refer to the Current Medication list given to you today.    Labwork: none  Testing/Procedures: Your physician has requested that you have an exercise stress myoview. For further information please visit [email or web address]. Please follow instruction sheet, as given.   Follow-Up: Your physician recommends that you schedule a follow-up appointment in: 6 months.  Please call our office in about 4 months to schedule this appointment.     Any Other Special Instructions Will Be Listed Below (If Applicable).     If you need a refill on your cardiac medications before your next appointment, please call your pharmacy.

## 2016-09-23 NOTE — Progress Notes (Signed)
Chief Complaint  Patient presents with  . Chest Pain    History of Present Illness: 56 yo female with history of CAD, Prinzmetal angina, HLD, IBS, GERD who is here today for cardiac follow up. She had been followed by Dr. Mare Ferrari prior to his retirement. She has Prinzmetal angina controlled with Diltiazem, Isordil and Ranexa. Last cardiac cath in January 2013 at which time she was noted to have spasm in the ostial LAD with mild calcification, ostial Diagonal 50% stenosis, 20% mid Circumflex stenosis, 30% OM stenosis, 30% mid RCA stenosis. LVEF 55% by cath. ABI normal September 2017. Episode of dizziness last week with left arm pain and jaw pain. Seen in ED at [facility]. Chest CTA without PE. Head CTA with small vessel disease. EKG without ischemic changes. Troponin negative.   She is here today for follow up. She denies SOB. She has no exertional pain. She is very active and walks daily.    Primary Care Physician: Eulas Post, MD  Past Medical History:  Diagnosis Date  . CAD (coronary artery disease)    has documented coronary vasospasm but with underlying 40% prox LAD, 30% prox LCX and 70 to 80% proximal 1st DX and 30% RCA; recurrent vasospasm with subsequent cath in Jan 2013.   Marland Kitchen Coronary vasospasm ([place])   . Esophagitis   . GERD (gastroesophageal reflux disease)   . Hyperlipidemia   . Hypertension   . IBS (irritable bowel syndrome)   . Palpitations    tachycardia on holter; treated with beta blockers    Past Surgical History:  Procedure Laterality Date  . APPENDECTOMY  1976  . BREAST SURGERY  2002   biopsy  . CARDIAC CATHETERIZATION  2011  . CARDIAC CATHETERIZATION  Sept 2012   With documented vasospasm but with residual disease; managed medically  . CHOLECYSTECTOMY  1982  . LEFT HEART CATHETERIZATION WITH CORONARY ANGIOGRAM N/A 09/14/2011   Procedure: LEFT HEART CATHETERIZATION WITH CORONARY ANGIOGRAM;  Surgeon: Burnell Blanks, MD;  [facility] C[facility] LAB;   Service: Cardiovascular;  Laterality: N/A;  . UPPER GASTROINTESTINAL ENDOSCOPY  01/11/2011   esophagitis    Current Outpatient Prescriptions  Medication Sig Dispense Refill  . acetaminophen (TYLENOL) 500 MG tablet Take 500 mg by mouth every 6 (six) hours as needed. For pain    . albuterol (PROVENTIL HFA;VENTOLIN HFA) 108 (90 Base) MCG/ACT inhaler Inhale 2 puffs into the lungs every 4 (four) hours as needed for wheezing or shortness of breath. 1 Inhaler 2  . aspirin 81 MG tablet Take 81 mg by mouth daily.      Marland Kitchen atorvastatin (LIPITOR) 20 MG tablet Take 1 tablet (20 mg total) by mouth daily. 90 tablet 3  . benzonatate (TESSALON) 100 MG capsule TAKE 1 CAPSULE (100 MG TOTAL) BY MOUTH 2 (TWO) TIMES DAILY AS NEEDED FOR COUGH. 20 capsule 0  . Cholecalciferol (VITAMIN D) 2000 units CAPS Take 2000 units once per day. 30 capsule   . diltiazem (CARDIZEM CD) 300 MG 24 hr capsule Take 1 capsule (300 mg total) by mouth daily. 90 capsule 3  . fluticasone (FLONASE) 50 MCG/ACT nasal spray Place 2 sprays into both nostrils daily. 16 g 0  . isosorbide dinitrate (ISORDIL) 30 MG tablet TAKE 1 TABLET (30 MG TOTAL) BY MOUTH 3 (THREE) TIMES DAILY. 90 tablet 7  . meclizine (ANTIVERT) 25 MG tablet Take 1 tablet (25 mg total) by mouth 3 (three) times daily as needed. 10 tablet 0  . nitroGLYCERIN (NITROSTAT) 0.4 MG  SL tablet Place 0.4 mg under the tongue every 5 (five) minutes as needed for chest pain. X 3 doses    . ranolazine (RANEXA) 500 MG 12 hr tablet Take 1 tablet (500 mg total) by mouth 2 (two) times daily. 180 tablet 2  . sertraline (ZOLOFT) 50 MG tablet Take 1 tablet (50 mg total) by mouth daily. 90 tablet 3   No current facility-administered medications for this visit.     No Known Allergies  Social History   Social History  . Marital status: Married    Spouse name: N/A  . Number of children: 2  . Years of education: N/A   Occupational History  . Unemployed    Social History Main Topics  . Smoking  status: Former Smoker    Packs/day: 0.25    Years: 30.00    Types: Cigarettes    Quit date: 05/30/2014  . Smokeless tobacco: Never Used  . Alcohol use 1.2 oz/week    2 Standard drinks or equivalent per week     Comment: occ  . Drug use: No  . Sexual activity: Yes   Other Topics Concern  . Not on file   Social History Narrative   Caffeine drinks 2 daily     Family History  Problem Relation Age of Onset  . Breast cancer Mother 64  . Heart disease Mother     died CHF 42  . Diabetes Mother   . Prostate cancer Father   . Hyperlipidemia Father   . Heart disease Father     s/p cabg - alive  . Colon cancer      First cousin on dads side     Review of Systems:  As stated in the HPI and otherwise negative.   BP 122/72   Pulse 90   Ht 5\' 4"  (1.626 m)   Wt 177 lb 6.4 oz (80.5 kg)   LMP 07/28/2010   BMI 30.45 kg/m   Physical Examination: General: Well developed, well nourished, NAD  HEENT: OP clear, mucus membranes moist  SKIN: warm, dry. No rashes. Neuro: No focal deficits  Musculoskeletal: Muscle strength 5/5 all ext  Psychiatric: Mood and affect normal  Neck: No JVD, no carotid bruits, no thyromegaly, no lymphadenopathy.  Lungs:Clear bilaterally, no wheezes, rhonci, crackles Cardiovascular: Regular rate and rhythm. No murmurs, gallops or rubs. Abdomen:Soft. Bowel sounds present. Non-tender.  Extremities: No lower extremity edema. Pulses are 2 + in the bilateral DP/PT.  Cardiac cath September 12, 2011: Left main: No obstructive disease. As noted above, pt with intense vasospasm at beginning of case.  Left Anterior Descending Artery: Ostial spasm however after NTG given, there is minimal obstructive disease in the ostium with mild calcification. The diagonal has ostial 50% stenosis, unchanged from previous cath.  Circumflex Artery: During initial injections, there is a 99% mid stenosis, however, after NTG given this area responded well with minimal 20% stenosis. The  proximal portion of the OM 1 also had 30% stenosis.  Right Coronary Artery: Large, dominant vessel with 30% mid stenosis.  Left Ventricular Angiogram: LVEF=55%.   EKG:  EKG is not ordered today. The ekg ordered today demonstrates   Recent Labs: 02/12/2016: ALT 29 03/17/2016: TSH 2.01 09/18/2016: BUN 11; Creatinine, Ser 0.69; Hemoglobin 15.7; Platelets 196; Potassium 4.1; Sodium 137   Lipid Panel    Component Value Date/Time   CHOL 160 02/12/2016 0926   TRIG 76 02/12/2016 0926   HDL 69 02/12/2016 0926   CHOLHDL 2.3 02/12/2016 0926  VLDL 15 02/12/2016 0926   LDLCALC 76 02/12/2016 0926     Wt Readings from Last 3 Encounters:  09/23/16 177 lb 6.4 oz (80.5 kg)  09/18/16 163 lb (73.9 kg)  03/17/16 183 lb 6.4 oz (83.2 kg)     Other studies Reviewed: Additional studies/ records that were reviewed today include: . Review of the above records demonstrates:   Assessment and Plan:   1. CAD with unstable angina/History of Coronary vasospasm: She has moderate CAD by cath in 2013 with documented coronary vasospasm. She has had a recent episode of left arm pain with jaw pain and dizziness. Concern for angina. Will arrange exercise nuclear stress test to exclude ischemia. Will continue Ranexa, Isordil and Diltiazem. She is also on a statin.  2. Hyperlipidemia: Will continue statin. LDL is at goal. Will continue Lipitor 10 mg daily.    Current medicines are reviewed at length with the patient today.  The patient does not have concerns regarding medicines.  The following changes have been made:  no change  Labs/ tests ordered today include:   Orders Placed This Encounter  Procedures  . Myocardial Perfusion Imaging    Disposition:   FU with me in 6 months  Signed, Lauree Chandler, MD 09/23/2016 8:45 AM    [facility] [place], [place], [place]  [address] Phone: [phone]; Fax: [phone]

## 2016-09-30 ENCOUNTER — Telehealth (HOSPITAL_COMMUNITY): Payer: Self-pay | Admitting: *Deleted

## 2016-09-30 DIAGNOSIS — H029 Unspecified disorder of eyelid: Secondary | ICD-10-CM | POA: Diagnosis not present

## 2016-09-30 NOTE — Telephone Encounter (Signed)
Patient given detailed instructions per Myocardial Perfusion Study Information Sheet for the test on 10/05/16. Patient notified to arrive 15 minutes early and that it is imperative to arrive on time for appointment to keep from having the test rescheduled.  If you need to cancel or reschedule your appointment, please call the office within 24 hours of your appointment. Failure to do so may result in a cancellation of your appointment, and a $50 no show fee. Patient verbalized understanding. [name]    

## 2016-10-05 ENCOUNTER — Ambulatory Visit (HOSPITAL_COMMUNITY): Payer: BLUE CROSS/BLUE SHIELD | Attending: Cardiology

## 2016-10-05 DIAGNOSIS — I25110 Atherosclerotic heart disease of native coronary artery with unstable angina pectoris: Secondary | ICD-10-CM | POA: Insufficient documentation

## 2016-10-05 LAB — MYOCARDIAL PERFUSION IMAGING
CHL CUP MPHR: 163 {beats}/min
CHL CUP NUCLEAR SDS: 4
CSEPED: 6 min
CSEPHR: 88 %
Estimated workload: 7 METS
Exercise duration (sec): 30 s
LHR: 0.28
LV dias vol: 88 mL (ref 46–106)
LV sys vol: 31 mL
Peak HR: 144 {beats}/min
RPE: 19
Rest HR: 67 {beats}/min
SRS: 5
SSS: 9
TID: 0.91

## 2016-10-05 MED ORDER — TECHNETIUM TC 99M TETROFOSMIN IV KIT
10.8000 | PACK | Freq: Once | INTRAVENOUS | Status: AC | PRN
  Administered 2016-10-05: 10.8 via INTRAVENOUS
  Filled 2016-10-05: qty 11

## 2016-10-05 MED ORDER — TECHNETIUM TC 99M TETROFOSMIN IV KIT
31.7000 | PACK | Freq: Once | INTRAVENOUS | Status: AC | PRN
  Administered 2016-10-05: 31.7 via INTRAVENOUS
  Filled 2016-10-05: qty 32

## 2016-10-07 ENCOUNTER — Ambulatory Visit (INDEPENDENT_AMBULATORY_CARE_PROVIDER_SITE_OTHER): Payer: Self-pay

## 2016-10-07 ENCOUNTER — Ambulatory Visit (INDEPENDENT_AMBULATORY_CARE_PROVIDER_SITE_OTHER): Payer: BLUE CROSS/BLUE SHIELD | Admitting: Orthopaedic Surgery

## 2016-10-07 ENCOUNTER — Encounter (INDEPENDENT_AMBULATORY_CARE_PROVIDER_SITE_OTHER): Payer: Self-pay | Admitting: Orthopaedic Surgery

## 2016-10-07 DIAGNOSIS — G5601 Carpal tunnel syndrome, right upper limb: Secondary | ICD-10-CM

## 2016-10-07 DIAGNOSIS — M7712 Lateral epicondylitis, left elbow: Secondary | ICD-10-CM

## 2016-10-07 DIAGNOSIS — M25522 Pain in left elbow: Secondary | ICD-10-CM | POA: Diagnosis not present

## 2016-10-07 MED ORDER — METHYLPREDNISOLONE 4 MG PO TABS: ORAL_TABLET | ORAL | 0 refills | Status: DC

## 2016-10-07 MED ORDER — DICLOFENAC SODIUM 1 % TD GEL: 2.0000 g | Freq: Four times a day (QID) | TRANSDERMAL | 3 refills | Status: DC

## 2016-10-07 MED ORDER — METHYLPREDNISOLONE ACETATE 40 MG/ML IJ SUSP
40.0000 mg | INTRAMUSCULAR | Status: AC | PRN
  Administered 2016-10-07: 40 mg

## 2016-10-07 MED ORDER — LIDOCAINE HCL 1 % IJ SOLN
1.0000 mL | INTRAMUSCULAR | Status: AC | PRN
  Administered 2016-10-07: 1 mL

## 2016-10-07 NOTE — Progress Notes (Signed)
Office Visit Note   Patient: Caroline [name]           Date of Birth: 18-Jun-1961           MRN: [id number] Visit Date: 10/07/2016              Requested by: Eulas Post, MD [place], [place] [address] PCP: Eulas Post, MD   Assessment & Plan: Visit Diagnoses:  1. Pain in left elbow     Plan: At this point she does want to consider a carpal tunnel release in the right side but may be a little bit later in the spring. She has eye surgery sometime in March. I'll see her back in 6 weeks to see how she doing overall. I am sending in six-day steroid taper and some Voltaren gel that she can lose a little lateral epicondylar area of the left elbow as well as over her transverse carpal ligament. She'll work on stretching exercises shoulders well.  Follow-Up Instructions: Return in about 6 weeks (around 11/18/2016).   Orders:  Orders Placed This Encounter  Procedures  . Hand/Upper Extremity Injection/Arthrocentesis  . XR Elbow 2 Views Left   Meds ordered this encounter  Medications  . methylPREDNISolone (MEDROL) 4 MG tablet    Sig: Medrol dose pack. Take as instructed    Dispense:  21 tablet    Refill:  0  . diclofenac sodium (VOLTAREN) 1 % GEL    Sig: Apply 2 g topically 4 (four) times daily.    Dispense:  100 g    Refill:  3      Procedures: Hand/UE Inj Date/Time: 10/07/2016 4:11 PM Performed by: Mcarthur Rossetti Authorized by: Mcarthur Rossetti   Condition: lateral epicondylitis   Site:  L elbow Medications:  1 mL lidocaine 1 %; 40 mg methylPREDNISolone acetate 40 MG/ML     Clinical Data: No additional findings.   Subjective: Chief Complaint  Patient presents with  . Left Elbow - Pain  Patient is well-known to me. She actually is had nerve conduction studies before and showed moderate carpal tunnel of her right upper chamois. His back in 2016 P Scissorhands stays numb now is getting worse she is dropping things. She is  mainly seen today for her left elbow lateral epicondylitis. See her for this before she's never had injection. She's tried an elbow brace and that is not work. She takes anti-inflammatories only on occasion. Scattered were is detrimental affecting her quality of life at this point and her activity is daily living. She also has the hand is getting worse numb.  HPI  Review of Systems He currently denies any chest pain, short of breath, fever, chills, nausea, vomiting.  Objective: Vital Signs: LMP 07/28/2010   Physical Exam He is alert and oriented 3 in no acute distress Ortho Exam Condition of her left elbow show severe pain over the lateral epicondylar area with full range of motion of that elbow. The elbow is ligamentously stable and has full range of motion. Examination of her right hand shows positive Phalen's and Tinel sign. She has a weak grip strength and numbness in the median nerve distribution. Specialty Comments:  No specialty comments available.  Imaging: Xr Elbow 2 Views Left  Result Date: 10/07/2016 An AP and lateral of the left elbow show well located elbow with no acute bony abnormalities and no malalignment of the joint. There is no significant arthritis in the joint either.    PMFS History:  Patient Active Problem List   Diagnosis Date Noted  . Intrinsic asthma 08/06/2014  . Dyslipidemia 02/13/2014  . Closed fracture of right humerus with routine healing 02/06/2013  . Bronchitis 09/29/2011  . Constipation, acute 09/17/2011  . IBS (irritable bowel syndrome) 09/15/2011  . Nonspecific elevation of levels of transaminase or lactic acid dehydrogenase (LDH) 09/15/2011  . Coronary vasospasm ([place])   . Tachycardia 07/28/2011  . History of esophagitis 07/28/2011  . Chest pain 07/12/2011  . CAD (coronary artery disease) 05/28/2011  . Abdominal pain, epigastric 12/04/2010  . Diarrhea 12/04/2010   Past Medical History:  Diagnosis Date  . CAD (coronary artery disease)     has documented coronary vasospasm but with underlying 40% prox LAD, 30% prox LCX and 70 to 80% proximal 1st DX and 30% RCA; recurrent vasospasm with subsequent cath in Jan 2013.   Marland Kitchen Coronary vasospasm ([place])   . Esophagitis   . GERD (gastroesophageal reflux disease)   . Hyperlipidemia   . Hypertension   . IBS (irritable bowel syndrome)   . Palpitations    tachycardia on holter; treated with beta blockers    Family History  Problem Relation Age of Onset  . Breast cancer Mother 59  . Heart disease Mother     died CHF 4  . Diabetes Mother   . Prostate cancer Father   . Hyperlipidemia Father   . Heart disease Father     s/p cabg - alive  . Colon cancer      First cousin on dads side     Past Surgical History:  Procedure Laterality Date  . APPENDECTOMY  1976  . BREAST SURGERY  2002   biopsy  . CARDIAC CATHETERIZATION  2011  . CARDIAC CATHETERIZATION  Sept 2012   With documented vasospasm but with residual disease; managed medically  . CHOLECYSTECTOMY  1982  . LEFT HEART CATHETERIZATION WITH CORONARY ANGIOGRAM N/A 09/14/2011   Procedure: LEFT HEART CATHETERIZATION WITH CORONARY ANGIOGRAM;  Surgeon: Burnell Blanks, MD;  Location: [facility] CATH LAB;  Service: Cardiovascular;  Laterality: N/A;  . UPPER GASTROINTESTINAL ENDOSCOPY  01/11/2011   esophagitis   Social History   Occupational History  . Unemployed    Social History Main Topics  . Smoking status: Former Smoker    Packs/day: 0.25    Years: 30.00    Types: Cigarettes    Quit date: 05/30/2014  . Smokeless tobacco: Never Used  . Alcohol use 1.2 oz/week    2 Standard drinks or equivalent per week     Comment: occ  . Drug use: No  . Sexual activity: Yes

## 2016-10-28 DIAGNOSIS — Z20828 Contact with and (suspected) exposure to other viral communicable diseases: Secondary | ICD-10-CM | POA: Diagnosis not present

## 2016-10-28 DIAGNOSIS — J069 Acute upper respiratory infection, unspecified: Secondary | ICD-10-CM | POA: Diagnosis not present

## 2016-11-11 DIAGNOSIS — H029 Unspecified disorder of eyelid: Secondary | ICD-10-CM | POA: Diagnosis not present

## 2016-11-11 DIAGNOSIS — D2312 Other benign neoplasm of skin of left eyelid, including canthus: Secondary | ICD-10-CM | POA: Diagnosis not present

## 2016-11-18 ENCOUNTER — Ambulatory Visit (INDEPENDENT_AMBULATORY_CARE_PROVIDER_SITE_OTHER): Payer: BLUE CROSS/BLUE SHIELD | Admitting: Orthopaedic Surgery

## 2016-11-24 ENCOUNTER — Other Ambulatory Visit: Payer: Self-pay | Admitting: *Deleted

## 2016-11-24 MED ORDER — ALBUTEROL SULFATE HFA 108 (90 BASE) MCG/ACT IN AERS
2.0000 | INHALATION_SPRAY | RESPIRATORY_TRACT | 2 refills | Status: DC | PRN
Start: 2016-11-24 — End: 2017-09-09

## 2016-11-24 NOTE — Telephone Encounter (Signed)
Rx done. 

## 2017-01-04 ENCOUNTER — Other Ambulatory Visit: Payer: Self-pay | Admitting: Physician Assistant

## 2017-01-04 DIAGNOSIS — E785 Hyperlipidemia, unspecified: Secondary | ICD-10-CM

## 2017-01-11 DIAGNOSIS — S61211A Laceration without foreign body of left index finger without damage to nail, initial encounter: Secondary | ICD-10-CM | POA: Diagnosis not present

## 2017-01-11 DIAGNOSIS — M79645 Pain in left finger(s): Secondary | ICD-10-CM | POA: Diagnosis not present

## 2017-01-17 ENCOUNTER — Other Ambulatory Visit: Payer: Self-pay | Admitting: Cardiovascular Disease

## 2017-01-17 ENCOUNTER — Other Ambulatory Visit: Payer: Self-pay | Admitting: *Deleted

## 2017-01-17 DIAGNOSIS — I201 Angina pectoris with documented spasm: Secondary | ICD-10-CM

## 2017-01-17 MED ORDER — SERTRALINE HCL 50 MG PO TABS: 50.0000 mg | ORAL_TABLET | Freq: Every day | ORAL | 0 refills | Status: DC

## 2017-01-17 MED ORDER — NITROGLYCERIN 0.4 MG SL SUBL: 0.4000 mg | SUBLINGUAL_TABLET | SUBLINGUAL | 1 refills | Status: DC | PRN

## 2017-03-09 DIAGNOSIS — I10 Essential (primary) hypertension: Secondary | ICD-10-CM | POA: Insufficient documentation

## 2017-03-09 NOTE — Progress Notes (Signed)
Cardiology Office Note:    Date:  03/10/2017   ID:  Caroline [name], DOB 07-18-1961, MRN [id number]  PCP:  Eulas Post, MD  Cardiologist:  Dr. Angelena Form   Referring MD: Eulas Post, MD   Chief Complaint  Patient presents with  . Follow-up    History of Present Illness:    Caroline [name] is a 56 y.o. female with a past medical history significant for CAD, Prinzmetal angina, HLD, hypertension, hypertension, IBS, GERD who is here today for 6 month follow-up. She was previously followed by Dr.Brackbill prior to his retirement. She has Prinzmetal angina controlled with diltiazem, Isordil and Ranexa. Her last cardiac catheterization was in January 2013 at which time she was noted to have spasm in the ostial LAD with mild calcification, ostial diagonal 50% stenosis, 20% mid circumflex stenosis, 30% OM stenosis, 30% mid RCA stenosis. LVEF was 55% by cath. She had normal ABIs in September 2017. She was seen in 08/2016 with complaints of dizziness and left arm and jaw pain. A chest CT was negative for PE but did show coronary artery calcifications. Head CTA showed small vessel disease. EKG is without ischemic changes and troponin was negative. A nuclear stress test was done on 10/05/16 which was low risk with EF 65%.  Patient comes in for follow-up today alone. She has had no further chest discomfort and it was felt that her major problem in January was vertigo. She takes meclizine as needed and only rarely. She has no dyspnea on exertion, is able to walk 2 flights of stairs in her home without problems. She has no orthopnea, PND or edema. She is tolerating her medications well. She does complain of increased fatigue and daytime sleepiness saying that she falls asleep easily whenever she sits down. She admits that her husband says she snores.  She is concerned about the long-term effects on Ranexa especially regarding her kidneys. She has no history of renal dysfunction and her kidney function has  been stable. Also her husband lost his job and the Ranexa is fairly expensive.  Past Medical History:  Diagnosis Date  . CAD (coronary artery disease)    has documented coronary vasospasm but with underlying 40% prox LAD, 30% prox LCX and 70 to 80% proximal 1st DX and 30% RCA; recurrent vasospasm with subsequent cath in Jan 2013.   Marland Kitchen Coronary vasospasm ([place])   . Esophagitis   . GERD (gastroesophageal reflux disease)   . Hyperlipidemia   . Hypertension   . IBS (irritable bowel syndrome)   . Palpitations    tachycardia on holter; treated with beta blockers    Past Surgical History:  Procedure Laterality Date  . APPENDECTOMY  1976  . BREAST SURGERY  2002   biopsy  . CARDIAC CATHETERIZATION  2011  . CARDIAC CATHETERIZATION  Sept 2012   With documented vasospasm but with residual disease; managed medically  . CHOLECYSTECTOMY  1982  . LEFT HEART CATHETERIZATION WITH CORONARY ANGIOGRAM N/A 09/14/2011   Procedure: LEFT HEART CATHETERIZATION WITH CORONARY ANGIOGRAM;  Surgeon: Burnell Blanks, MD;  Location: [facility] CATH LAB;  Service: Cardiovascular;  Laterality: N/A;  . UPPER GASTROINTESTINAL ENDOSCOPY  01/11/2011   esophagitis    Current Medications: Current Meds  Medication Sig  . acetaminophen (TYLENOL) 500 MG tablet Take 500 mg by mouth every 6 (six) hours as needed. For pain  . albuterol (PROVENTIL HFA;VENTOLIN HFA) 108 (90 Base) MCG/ACT inhaler Inhale 2 puffs into the lungs every 4 (four) hours as needed for  wheezing or shortness of breath.  Marland Kitchen aspirin 81 MG tablet Take 81 mg by mouth daily.    Marland Kitchen atorvastatin (LIPITOR) 20 MG tablet TAKE 1 TABLET (20 MG TOTAL) BY MOUTH DAILY.  . benzonatate (TESSALON) 100 MG capsule TAKE 1 CAPSULE (100 MG TOTAL) BY MOUTH 2 (TWO) TIMES DAILY AS NEEDED FOR COUGH.  . Cholecalciferol (VITAMIN D) 2000 units CAPS Take 2000 units once per day.  . diclofenac sodium (VOLTAREN) 1 % GEL Apply 2 g topically 4 (four) times daily.  Marland Kitchen diltiazem (CARDIZEM CD)  300 MG 24 hr capsule TAKE 1 CAPSULE (300 MG TOTAL) BY MOUTH DAILY.  . fluticasone (FLONASE) 50 MCG/ACT nasal spray Place 2 sprays into both nostrils daily.  . isosorbide dinitrate (ISORDIL) 30 MG tablet TAKE 1 TABLET (30 MG TOTAL) BY MOUTH 3 (THREE) TIMES DAILY.  . meclizine (ANTIVERT) 25 MG tablet Take 1 tablet (25 mg total) by mouth 3 (three) times daily as needed.  . nitroGLYCERIN (NITROSTAT) 0.4 MG SL tablet Place 1 tablet (0.4 mg total) under the tongue every 5 (five) minutes as needed for chest pain. X 3 doses  . ranolazine (RANEXA) 500 MG 12 hr tablet Take 1 tablet (500 mg total) by mouth 2 (two) times daily.  . sertraline (ZOLOFT) 50 MG tablet Take 1 tablet (50 mg total) by mouth daily.     Allergies:   Patient has no known allergies.   Social History   Social History  . Marital status: Married    Spouse name: N/A  . Number of children: 2  . Years of education: N/A   Occupational History  . Unemployed    Social History Main Topics  . Smoking status: Former Smoker    Packs/day: 0.25    Years: 30.00    Types: Cigarettes    Quit date: 05/30/2014  . Smokeless tobacco: Never Used  . Alcohol use 1.2 oz/week    2 Standard drinks or equivalent per week     Comment: occ  . Drug use: No  . Sexual activity: Yes   Other Topics Concern  . None   Social History Narrative   Caffeine drinks 2 daily      Family History: The patient's family history includes Breast cancer (age of onset: 76) in her mother; Colon cancer in her unknown relative; Diabetes in her mother; Heart disease in her father and mother; Hyperlipidemia in her father; Prostate cancer in her father. ROS:   Please see the history of present illness.     All other systems reviewed and are negative.  EKGs/Labs/Other Studies Reviewed:    The following studies were reviewed today:  Nuclear stress test 10/05/16  Nuclear stress EF: 65%. No wall motion abnormalities  There was no ST segment deviation noted during  stress.  Defect 1: There is a small defect of moderate severity present in the mid anteroseptal location.  This is a low risk study. No ischemia identified. Mid anteroseptal wall focal defect may be secondary to previously described ostial diagonal disease   Cardiac cath September 12, 2011 Left main: No obstructive disease. As noted above, pt with intense vasospasm at beginning of case.  Left Anterior Descending Artery: Ostial spasm however after NTG given, there is minimal obstructive disease in the ostium with mild calcification. The diagonal has ostial 50% stenosis, unchanged from previous cath.  Circumflex Artery: During initial injections, there is a 99% mid stenosis, however, after NTG given this area responded well with minimal 20% stenosis. The proximal portion  of the OM 1 also had 30% stenosis.  Right Coronary Artery: Large, dominant vessel with 30% mid stenosis.  Left Ventricular Angiogram: LVEF=55%.    EKG:  EKG is  ordered today.  The ekg ordered today demonstrates sinus tachycardia at 103 bpm, QTC 448. No significant changes except for increased rate from previous.  Recent Labs: 03/17/2016: TSH 2.01 09/18/2016: BUN 11; Creatinine, Ser 0.69; Hemoglobin 15.7; Platelets 196; Potassium 4.1; Sodium 137   Recent Lipid Panel    Component Value Date/Time   CHOL 160 02/12/2016 0926   TRIG 76 02/12/2016 0926   HDL 69 02/12/2016 0926   CHOLHDL 2.3 02/12/2016 0926   VLDL 15 02/12/2016 0926   LDLCALC 76 02/12/2016 0926    Physical Exam:    VS:  BP 138/82   Pulse (!) 103   Ht 5\' 4"  (1.626 m)   Wt 177 lb 6.4 oz (80.5 kg)   LMP 07/28/2010   BMI 30.45 kg/m     Wt Readings from Last 3 Encounters:  03/10/17 177 lb 6.4 oz (80.5 kg)  10/05/16 177 lb (80.3 kg)  09/23/16 177 lb 6.4 oz (80.5 kg)     GEN:  Well nourished, well developed in no acute distress HEENT: Normal NECK: No JVD; No carotid bruits LYMPHATICS: No lymphadenopathy CARDIAC: RRR, no murmurs, rubs,  gallops RESPIRATORY:  Clear to auscultation without rales, wheezing or rhonchi  ABDOMEN: Soft, non-tender, non-distended MUSCULOSKELETAL:  No edema; No deformity  SKIN: Warm and dry NEUROLOGIC:  Alert and oriented x 3 PSYCHIATRIC:  Normal affect   ASSESSMENT:    1. Coronary artery disease involving native coronary artery of native heart without angina pectoris   2. Coronary vasospasm (HCC)   3. Dyslipidemia   4. Essential hypertension   5. Daytime sleepiness   6. Snoring    PLAN:    In order of problems listed above:  CAD/history of coronary vasospasm: The patient has nonobstructive CAD and documented vasospasm per cath in 2013. She has had no recent symptoms. Continue Ranexa, Isordil and diltiazem. Patient is questioning the Ranexa and with her husband's job loss she is anticipating possible cost related issues. We discussed possibly a trial of going off the Ranexa if it is causing her hardship to see if her symptoms worsen. She states that she would like to continue as long as she can. QTC is stable on EKG. Her heart rate is elevated today with 103 bpm on EKG. After resting in exam room radial pulse is 92 bpm. He has had 2 regular cups of coffee this morning and has not taken her diltiazem as of yet. We discussed effects of caffeine on the heart and she will try to cut down to one cup per day.  Hypertension: Blood pressure is well controlled on current medications  Sleep disturbance: The patient reports fatigue and daytime sleepiness. She admits to snoring and is agreeable to a sleep study.  Hyperlipidemia: LDL was 76 on 02/12/16. Monitored by PCP. Continue atorvastatin 20 mg daily    Medication Adjustments/Labs and Tests Ordered: Current medicines are reviewed at length with the patient today.  Concerns regarding medicines are outlined above. Labs and tests ordered and medication changes are outlined in the patient instructions below:  Patient Instructions  Medication  Instructions:  Your physician recommends that you continue on your current medications as directed. Please refer to the Current Medication list given to you today.   Labwork: NONE ORDERED TODAY  Testing/Procedures: 1. Your physician has recommended that you  have a SPLIT NIGHT sleep study. This test records several body functions during sleep, including: brain activity, eye movement, oxygen and carbon dioxide blood levels, heart rate and rhythm, breathing rate and rhythm, the flow of air through your mouth and nose, snoring, body muscle movements, and chest and belly movement. NINA JONES, CMA FROM OUR OFFICE WILL CALL YOU TO SCHEDULE STUDY    Follow-Up: Your physician wants you to follow-up in: 6 MONTHS WITH DR. Angelena Form You will receive a reminder letter in the mail two months in advance. If you don't receive a letter, please call our office to schedule the follow-up appointment.   Any Other Special Instructions Will Be Listed Below (If Applicable).     If you need a refill on your cardiac medications before your next appointment, please call your pharmacy.      Signed, Daune Perch, NP  03/10/2017 10:52 AM    [place]

## 2017-03-10 ENCOUNTER — Encounter: Payer: Self-pay | Admitting: Cardiology

## 2017-03-10 ENCOUNTER — Ambulatory Visit (INDEPENDENT_AMBULATORY_CARE_PROVIDER_SITE_OTHER): Payer: BLUE CROSS/BLUE SHIELD | Admitting: Cardiology

## 2017-03-10 VITALS — BP 138/82 | HR 103 | Ht 64.0 in | Wt 177.4 lb

## 2017-03-10 DIAGNOSIS — R0683 Snoring: Secondary | ICD-10-CM

## 2017-03-10 DIAGNOSIS — I10 Essential (primary) hypertension: Secondary | ICD-10-CM

## 2017-03-10 DIAGNOSIS — R400 Somnolence: Secondary | ICD-10-CM

## 2017-03-10 DIAGNOSIS — I2510 Atherosclerotic heart disease of native coronary artery without angina pectoris: Secondary | ICD-10-CM | POA: Diagnosis not present

## 2017-03-10 DIAGNOSIS — I201 Angina pectoris with documented spasm: Secondary | ICD-10-CM | POA: Diagnosis not present

## 2017-03-10 DIAGNOSIS — E785 Hyperlipidemia, unspecified: Secondary | ICD-10-CM | POA: Diagnosis not present

## 2017-03-10 NOTE — Patient Instructions (Signed)
Medication Instructions:  Your physician recommends that you continue on your current medications as directed. Please refer to the Current Medication list given to you today.   Labwork: NONE ORDERED TODAY  Testing/Procedures: 1. Your physician has recommended that you have a SPLIT NIGHT sleep study. This test records several body functions during sleep, including: brain activity, eye movement, oxygen and carbon dioxide blood levels, heart rate and rhythm, breathing rate and rhythm, the flow of air through your mouth and nose, snoring, body muscle movements, and chest and belly movement. NINA JONES, CMA FROM OUR OFFICE WILL CALL YOU TO SCHEDULE STUDY    Follow-Up: Your physician wants you to follow-up in: 6 MONTHS WITH DR. Angelena Form You will receive a reminder letter in the mail two months in advance. If you don't receive a letter, please call our office to schedule the follow-up appointment.   Any Other Special Instructions Will Be Listed Below (If Applicable).     If you need a refill on your cardiac medications before your next appointment, please call your pharmacy.

## 2017-03-17 ENCOUNTER — Telehealth: Payer: Self-pay | Admitting: *Deleted

## 2017-03-17 NOTE — Telephone Encounter (Signed)
-----   Message from Michae Kava, [place] sent at 03/10/2017 11:16 AM EDT ----- Regarding: SPLIT NIGHT SLEEP STUDY Hi sweet girl,  Pt saw Daune Perch, NP with Melina Copa, PA and was ordered a SPLIT NIGHT SLEEP STUDY. Dx Snoring and Daytime sleepiness.  Thank you Arbie Cookey

## 2017-03-17 NOTE — Telephone Encounter (Signed)
Informed patient of upcoming sleep study and patient understanding was verbalized. Patient understands her sleep study is scheduled for Thursday May 12 2017. Patient understands her sleep study will be done at [facility] sleep lab. Patient understands she will receive a sleep packet in a week or so. Patient understands to call if she does not receive the sleep packet in a timely manner. Patient agrees with treatment and thanked me for call.

## 2017-03-24 DIAGNOSIS — D2261 Melanocytic nevi of right upper limb, including shoulder: Secondary | ICD-10-CM | POA: Diagnosis not present

## 2017-04-04 DIAGNOSIS — H6981 Other specified disorders of Eustachian tube, right ear: Secondary | ICD-10-CM | POA: Diagnosis not present

## 2017-04-04 DIAGNOSIS — H6121 Impacted cerumen, right ear: Secondary | ICD-10-CM | POA: Diagnosis not present

## 2017-04-07 NOTE — Telephone Encounter (Signed)
Patient called asking to cancel her sleep study because she has lost her insurance coverage. Patient states she will call our office back when she has more insurance in effect.

## 2017-04-11 ENCOUNTER — Ambulatory Visit (INDEPENDENT_AMBULATORY_CARE_PROVIDER_SITE_OTHER): Payer: BLUE CROSS/BLUE SHIELD | Admitting: Orthopaedic Surgery

## 2017-04-11 ENCOUNTER — Ambulatory Visit (INDEPENDENT_AMBULATORY_CARE_PROVIDER_SITE_OTHER): Payer: BLUE CROSS/BLUE SHIELD

## 2017-04-11 DIAGNOSIS — M25511 Pain in right shoulder: Secondary | ICD-10-CM

## 2017-04-11 MED ORDER — LIDOCAINE HCL 1 % IJ SOLN
3.0000 mL | INTRAMUSCULAR | Status: AC | PRN
Start: 2017-04-11 — End: 2017-04-11
  Administered 2017-04-11: 3 mL

## 2017-04-11 MED ORDER — TRAMADOL HCL 50 MG PO TABS: 100.0000 mg | ORAL_TABLET | Freq: Three times a day (TID) | ORAL | 0 refills | Status: DC | PRN

## 2017-04-11 MED ORDER — METHYLPREDNISOLONE ACETATE 40 MG/ML IJ SUSP
40.0000 mg | INTRAMUSCULAR | Status: AC | PRN
Start: 2017-04-11 — End: 2017-04-11
  Administered 2017-04-11: 40 mg via INTRA_ARTICULAR

## 2017-04-11 NOTE — Progress Notes (Signed)
Office Visit Note   Patient: Caroline [name]           Date of Birth: 1960/12/25           MRN: [id number] Visit Date: 04/11/2017              Requested by: Eulas Post, MD [place], [place] [id number] PCP: Eulas Post, MD   Assessment & Plan: Visit Diagnoses:  1. Acute pain of right shoulder     Plan: Given her physical exam this certainly could be a rotator cuff issue. I talked about trying a subacromial steroid injection and she is agreeable with this. We'll also put her on some tramadol. I like see her back in 1 week to see how the injection is done for her. She still having considerable weakness and pain an MRI would be warranted. All questions were encouraged and answered.  Follow-Up Instructions: Return in about 1 week (around 04/18/2017).   Orders:  Orders Placed This Encounter  Procedures  . Large Joint Injection/Arthrocentesis  . XR Shoulder Right   Meds ordered this encounter  Medications  . traMADol (ULTRAM) 50 MG tablet    Sig: Take 2 tablets (100 mg total) by mouth 3 (three) times daily as needed.    Dispense:  60 tablet    Refill:  0      Procedures: Large Joint Inj Date/Time: 04/11/2017 2:25 PM Performed by: Mcarthur Rossetti Authorized by: Mcarthur Rossetti   Location:  Shoulder Site:  R subacromial bursa Ultrasound Guidance: No   Fluoroscopic Guidance: No   Arthrogram: No   Medications:  3 mL lidocaine 1 %; 40 mg methylPREDNISolone acetate 40 MG/ML     Clinical Data: No additional findings.   Subjective: No chief complaint on file. The patient is well-known to me but I haven't seen her for a while. She had a history of a humeral shaft fracture on the right side but this is remote. She felt a pop in her shoulder about a week ago when she was lifting a gallon of milk and she is hurting since then. She's tried ibuprofen. She said over activities reaching behind her causing a significant amount of pain.  She denies any neck pain and denies a nubs and tingling in her hand. His ulnar shoulder. She is also feeling a popping sensation.  HPI  Review of Systems She currently denies any headache, chest pain, fever, chills, nausea, vomiting, shortness of breath.  Objective: Vital Signs: LMP 07/28/2010   Physical Exam She is alert and oriented 3 and in no acute distress Ortho Exam Examination of her right shoulder shows some limitations with abduction and overhead motion secondary to her pain. She is also having problems with internal rotation and adduction. She is using similar deltoids to abduct her shoulder as well. Specialty Comments:  No specialty comments available.  Imaging: Xr Shoulder Right  Result Date: 04/11/2017 3 is of the right shoulder show well located shoulder with no acute findings.    PMFS History: Patient Active Problem List   Diagnosis Date Noted  . Hypertension   . Lateral epicondylitis of left elbow 10/07/2016  . Carpal tunnel syndrome, right upper limb 10/07/2016  . Intrinsic asthma 08/06/2014  . Dyslipidemia 02/13/2014  . Closed fracture of right humerus with routine healing 02/06/2013  . Bronchitis 09/29/2011  . Constipation, acute 09/17/2011  . IBS (irritable bowel syndrome) 09/15/2011  . Nonspecific elevation of levels of transaminase or lactic acid dehydrogenase (  LDH) 09/15/2011  . Coronary vasospasm ([place])   . Tachycardia 07/28/2011  . History of esophagitis 07/28/2011  . Chest pain 07/12/2011  . CAD (coronary artery disease) 05/28/2011  . Abdominal pain, epigastric 12/04/2010  . Diarrhea 12/04/2010   Past Medical History:  Diagnosis Date  . CAD (coronary artery disease)    has documented coronary vasospasm but with underlying 40% prox LAD, 30% prox LCX and 70 to 80% proximal 1st DX and 30% RCA; recurrent vasospasm with subsequent cath in Jan 2013.   Marland Kitchen Coronary vasospasm ([place])   . Esophagitis   . GERD (gastroesophageal reflux disease)   .  Hyperlipidemia   . Hypertension   . IBS (irritable bowel syndrome)   . Palpitations    tachycardia on holter; treated with beta blockers    Family History  Problem Relation Age of Onset  . Breast cancer Mother 5  . Heart disease Mother        died CHF 2  . Diabetes Mother   . Prostate cancer Father   . Hyperlipidemia Father   . Heart disease Father        s/p cabg - alive  . Colon cancer Unknown        First cousin on dads side     Past Surgical History:  Procedure Laterality Date  . APPENDECTOMY  1976  . BREAST SURGERY  2002   biopsy  . CARDIAC CATHETERIZATION  2011  . CARDIAC CATHETERIZATION  Sept 2012   With documented vasospasm but with residual disease; managed medically  . CHOLECYSTECTOMY  1982  . LEFT HEART CATHETERIZATION WITH CORONARY ANGIOGRAM N/A 09/14/2011   Procedure: LEFT HEART CATHETERIZATION WITH CORONARY ANGIOGRAM;  Surgeon: Burnell Blanks, MD;  Location: [facility] CATH LAB;  Service: Cardiovascular;  Laterality: N/A;  . UPPER GASTROINTESTINAL ENDOSCOPY  01/11/2011   esophagitis   Social History   Occupational History  . Unemployed    Social History Main Topics  . Smoking status: Former Smoker    Packs/day: 0.25    Years: 30.00    Types: Cigarettes    Quit date: 05/30/2014  . Smokeless tobacco: Never Used  . Alcohol use 1.2 oz/week    2 Standard drinks or equivalent per week     Comment: occ  . Drug use: No  . Sexual activity: Yes

## 2017-04-18 ENCOUNTER — Ambulatory Visit (INDEPENDENT_AMBULATORY_CARE_PROVIDER_SITE_OTHER): Payer: BLUE CROSS/BLUE SHIELD | Admitting: Physician Assistant

## 2017-05-10 ENCOUNTER — Telehealth: Payer: Self-pay | Admitting: Cardiovascular Disease

## 2017-05-10 ENCOUNTER — Other Ambulatory Visit: Payer: Self-pay | Admitting: Cardiovascular Disease

## 2017-05-10 NOTE — Telephone Encounter (Signed)
Patient asked for records to be faxed. She is aware she needs to sign a release.

## 2017-05-10 NOTE — Telephone Encounter (Signed)
New Message  Pt call requesting to speak with Medical Records. Please call back to discuss

## 2017-05-12 ENCOUNTER — Encounter (HOSPITAL_BASED_OUTPATIENT_CLINIC_OR_DEPARTMENT_OTHER): Payer: BLUE CROSS/BLUE SHIELD

## 2017-05-19 ENCOUNTER — Encounter: Payer: Self-pay | Admitting: Family Medicine

## 2017-06-02 ENCOUNTER — Other Ambulatory Visit: Payer: Self-pay | Admitting: *Deleted

## 2017-06-02 MED ORDER — ISOSORBIDE DINITRATE 30 MG PO TABS: ORAL_TABLET | ORAL | 2 refills | Status: DC

## 2017-06-20 ENCOUNTER — Other Ambulatory Visit: Payer: Self-pay | Admitting: Cardiovascular Disease

## 2017-07-06 ENCOUNTER — Other Ambulatory Visit: Payer: Self-pay | Admitting: Family Medicine

## 2017-07-19 ENCOUNTER — Telehealth: Payer: Self-pay | Admitting: Family Medicine

## 2017-07-19 NOTE — Telephone Encounter (Signed)
Patient has taken her last Zoloft today- she does have an appointment for Monday and she does plan to keep that appointment. Can she get a refill of her Zoloft to get her to that appointment?

## 2017-07-19 NOTE — Telephone Encounter (Signed)
Yes  Refill for one month.

## 2017-07-20 MED ORDER — SERTRALINE HCL 50 MG PO TABS: 50.0000 mg | ORAL_TABLET | Freq: Every day | ORAL | 0 refills | Status: DC

## 2017-07-20 NOTE — Telephone Encounter (Signed)
Rx sent 

## 2017-07-20 NOTE — Addendum Note (Signed)
Addended by: Westley Hummer B on: 07/20/2017 11:00 AM   Modules accepted: Orders

## 2017-07-25 ENCOUNTER — Ambulatory Visit: Payer: BLUE CROSS/BLUE SHIELD | Admitting: Family Medicine

## 2017-07-25 ENCOUNTER — Encounter: Payer: Self-pay | Admitting: Family Medicine

## 2017-07-25 ENCOUNTER — Other Ambulatory Visit: Payer: Self-pay | Admitting: Family Medicine

## 2017-07-25 VITALS — BP 110/64 | HR 92 | Temp 98.3°F | Wt 180.1 lb

## 2017-07-25 DIAGNOSIS — F419 Anxiety disorder, unspecified: Secondary | ICD-10-CM

## 2017-07-25 DIAGNOSIS — Z1231 Encounter for screening mammogram for malignant neoplasm of breast: Secondary | ICD-10-CM

## 2017-07-25 NOTE — Progress Notes (Signed)
Subjective:     Patient ID: Caroline [name], female   DOB: 02/19/1961, 56 y.o.   MRN: [id number]  HPI Patient here basically requesting refills of sertraline. She was placed on several years ago really more for anxiety symptoms. This appeared be more work-related and stress-related. She called recently for refill as had not been seen here in over a year. Denies any history of depression. She is no longer working at her previous job and states her stress levels are greatly diminished.  Past Medical History:  Diagnosis Date  . CAD (coronary artery disease)    has documented coronary vasospasm but with underlying 40% prox LAD, 30% prox LCX and 70 to 80% proximal 1st DX and 30% RCA; recurrent vasospasm with subsequent cath in Jan 2013.   Marland Kitchen Coronary vasospasm ([place])   . Esophagitis   . GERD (gastroesophageal reflux disease)   . Hyperlipidemia   . Hypertension   . IBS (irritable bowel syndrome)   . Palpitations    tachycardia on holter; treated with beta blockers   Past Surgical History:  Procedure Laterality Date  . APPENDECTOMY  1976  . BREAST SURGERY  2002   biopsy  . CARDIAC CATHETERIZATION  2011  . CARDIAC CATHETERIZATION  Sept 2012   With documented vasospasm but with residual disease; managed medically  . CHOLECYSTECTOMY  1982  . LEFT HEART CATHETERIZATION WITH CORONARY ANGIOGRAM N/A 09/14/2011   Procedure: LEFT HEART CATHETERIZATION WITH CORONARY ANGIOGRAM;  Surgeon: Burnell Blanks, MD;  Location: [facility] CATH LAB;  Service: Cardiovascular;  Laterality: N/A;  . UPPER GASTROINTESTINAL ENDOSCOPY  01/11/2011   esophagitis    reports that she quit smoking about 3 years ago. Her smoking use included cigarettes. She has a 7.50 pack-year smoking history. she has never used smokeless tobacco. She reports that she drinks about 1.2 oz of alcohol per week. She reports that she does not use drugs. family history includes Breast cancer (age of onset: 35) in her mother; Colon cancer in her unknown  relative; Diabetes in her mother; Heart disease in her father and mother; Hyperlipidemia in her father; Prostate cancer in her father. No Known Allergies   Review of Systems  Constitutional: Negative for appetite change and unexpected weight change.  Cardiovascular: Negative for chest pain.  Psychiatric/Behavioral: Negative for dysphoric mood. The patient is not nervous/anxious.        Objective:   Physical Exam  Constitutional: She appears well-developed and well-nourished.  Cardiovascular: Normal rate.  Pulmonary/Chest: Effort normal and breath sounds normal. No respiratory distress. She has no wheezes. She has no rales.  Psychiatric: She has a normal mood and affect. Her behavior is normal.       Assessment:     History of anxiety state. Largely situational    Plan:     -Recommend she decrease sertraline to 25 mg daily for 2-3 weeks and then try discontinuing -will emphasize nonpharmacologic ways of managing stress  Eulas Post MD [facility]

## 2017-07-25 NOTE — Patient Instructions (Signed)
Try reducing the Sertraline to one half tablet daily for 2-3 weeks and then discontinue.

## 2017-07-29 ENCOUNTER — Ambulatory Visit: Payer: Self-pay

## 2017-08-01 ENCOUNTER — Other Ambulatory Visit: Payer: Self-pay

## 2017-08-01 DIAGNOSIS — I201 Angina pectoris with documented spasm: Secondary | ICD-10-CM

## 2017-08-01 MED ORDER — DILTIAZEM HCL ER COATED BEADS 300 MG PO CP24: 300.0000 mg | ORAL_CAPSULE | Freq: Every day | ORAL | 2 refills | Status: DC

## 2017-08-29 ENCOUNTER — Ambulatory Visit
Admission: RE | Admit: 2017-08-29 | Discharge: 2017-08-29 | Disposition: A | Discharge status: Home / Self Care | Payer: BLUE CROSS/BLUE SHIELD | Source: Ambulatory Visit | Attending: Family Medicine | Admitting: Family Medicine

## 2017-08-29 DIAGNOSIS — Z1231 Encounter for screening mammogram for malignant neoplasm of breast: Secondary | ICD-10-CM | POA: Diagnosis not present

## 2017-08-31 ENCOUNTER — Other Ambulatory Visit: Payer: Self-pay | Admitting: Family Medicine

## 2017-08-31 DIAGNOSIS — R928 Other abnormal and inconclusive findings on diagnostic imaging of breast: Secondary | ICD-10-CM

## 2017-09-01 ENCOUNTER — Telehealth: Payer: Self-pay | Admitting: Family Medicine

## 2017-09-01 ENCOUNTER — Telehealth: Payer: BLUE CROSS/BLUE SHIELD | Admitting: Family

## 2017-09-01 DIAGNOSIS — J028 Acute pharyngitis due to other specified organisms: Secondary | ICD-10-CM

## 2017-09-01 DIAGNOSIS — B9689 Other specified bacterial agents as the cause of diseases classified elsewhere: Secondary | ICD-10-CM

## 2017-09-01 MED ORDER — AZITHROMYCIN 250 MG PO TABS: ORAL_TABLET | ORAL | 0 refills | Status: DC

## 2017-09-01 MED ORDER — BENZONATATE 100 MG PO CAPS: 100.0000 mg | ORAL_CAPSULE | Freq: Three times a day (TID) | ORAL | 0 refills | Status: DC | PRN

## 2017-09-01 NOTE — Progress Notes (Signed)

## 2017-09-01 NOTE — Telephone Encounter (Signed)
Copied from [place] [id number]. Topic: General - Other >> Sep 01, 2017 11:09 AM Scherrie Gerlach wrote: Reason for CRM: pt wants Dr Elease Hashimoto to know she has scheduled her diagnostic mammogram for Monday 1/07 as she had her regular mammogram 12/31 and they ordered this additional.

## 2017-09-02 NOTE — Telephone Encounter (Signed)
Noted. Thanks.

## 2017-09-05 ENCOUNTER — Ambulatory Visit
Admission: RE | Admit: 2017-09-05 | Discharge: 2017-09-05 | Disposition: A | Discharge status: Home / Self Care | Payer: BLUE CROSS/BLUE SHIELD | Source: Ambulatory Visit | Attending: Family Medicine | Admitting: Family Medicine

## 2017-09-05 ENCOUNTER — Ambulatory Visit: Payer: BLUE CROSS/BLUE SHIELD

## 2017-09-05 DIAGNOSIS — R922 Inconclusive mammogram: Secondary | ICD-10-CM | POA: Diagnosis not present

## 2017-09-05 DIAGNOSIS — R928 Other abnormal and inconclusive findings on diagnostic imaging of breast: Secondary | ICD-10-CM

## 2017-09-05 DIAGNOSIS — N6489 Other specified disorders of breast: Secondary | ICD-10-CM | POA: Diagnosis not present

## 2017-09-07 ENCOUNTER — Telehealth: Payer: Self-pay

## 2017-09-07 NOTE — Telephone Encounter (Signed)
Ok   Makes sense since she lives over in [place].

## 2017-09-07 NOTE — Telephone Encounter (Signed)
Copied from [place] [id number]. Topic: Appointment Scheduling - Scheduling Inquiry for Clinic >> Sep 07, 2017  4:39 PM Vernona Rieger wrote: Patient wants to do a transfer of care from Dr Elease Hashimoto from [place] to [facility] at [facility]. If approved, please call patient and sch Call back is [phone]

## 2017-09-07 NOTE — Telephone Encounter (Signed)
Fine with me. Thanks

## 2017-09-08 NOTE — Telephone Encounter (Signed)
Left message asking pt to call office  °

## 2017-09-09 ENCOUNTER — Ambulatory Visit: Payer: BLUE CROSS/BLUE SHIELD | Admitting: Family Medicine

## 2017-09-09 ENCOUNTER — Encounter: Payer: Self-pay | Admitting: Family Medicine

## 2017-09-09 VITALS — BP 110/70 | HR 71 | Temp 98.4°F | Wt 183.3 lb

## 2017-09-09 DIAGNOSIS — J019 Acute sinusitis, unspecified: Secondary | ICD-10-CM

## 2017-09-09 MED ORDER — AMOXICILLIN-POT CLAVULANATE 875-125 MG PO TABS: 1.0000 | ORAL_TABLET | Freq: Two times a day (BID) | ORAL | 0 refills | Status: DC

## 2017-09-09 MED ORDER — PREDNISONE 10 MG PO TABS: ORAL_TABLET | ORAL | 0 refills | Status: DC

## 2017-09-09 NOTE — Progress Notes (Signed)
Subjective:     Patient ID: Caroline [name], female   DOB: 1960-10-09, 57 y.o.   MRN: [id number]  HPI Patient seen with almost 3 week history of some progressive sinus symptoms of congestion and pain. She is getting ready to transfer to [facility] location as she lives only about 3 minutes from that practice.    8 days ago she did an e- visit and was prescribed Zithromax for "acute bronchitis ". She felt no better after taking this. She states she's had some progressive facial pain especially maxillary sinus region bilaterally. Occasional yellow discharge. No bloody discharge. She's had associated dry cough. Mild intermittent vertigo. Bilateral ear pain right greater than left. Mild shortness of breath and some intermittent wheezing  Quit smoking 2015  Past Medical History:  Diagnosis Date  . CAD (coronary artery disease)    has documented coronary vasospasm but with underlying 40% prox LAD, 30% prox LCX and 70 to 80% proximal 1st DX and 30% RCA; recurrent vasospasm with subsequent cath in Jan 2013.   Marland Kitchen Coronary vasospasm ([place])   . Esophagitis   . GERD (gastroesophageal reflux disease)   . Hyperlipidemia   . Hypertension   . IBS (irritable bowel syndrome)   . Palpitations    tachycardia on holter; treated with beta blockers   Past Surgical History:  Procedure Laterality Date  . APPENDECTOMY  1976  . BREAST EXCISIONAL BIOPSY Right    benign  . BREAST SURGERY  2002   biopsy  . CARDIAC CATHETERIZATION  2011  . CARDIAC CATHETERIZATION  Sept 2012   With documented vasospasm but with residual disease; managed medically  . CHOLECYSTECTOMY  1982  . LEFT HEART CATHETERIZATION WITH CORONARY ANGIOGRAM N/A 09/14/2011   Procedure: LEFT HEART CATHETERIZATION WITH CORONARY ANGIOGRAM;  Surgeon: Burnell Blanks, MD;  Location: [facility] CATH LAB;  Service: Cardiovascular;  Laterality: N/A;  . UPPER GASTROINTESTINAL ENDOSCOPY  01/11/2011   esophagitis    reports that she quit smoking about 3 years  ago. Her smoking use included cigarettes. She has a 7.50 pack-year smoking history. she has never used smokeless tobacco. She reports that she drinks about 1.2 oz of alcohol per week. She reports that she does not use drugs. family history includes Breast cancer (age of onset: 69) in her mother; Colon cancer in her unknown relative; Diabetes in her mother; Heart disease in her father and mother; Hyperlipidemia in her father; Prostate cancer in her father. No Known Allergies   Review of Systems  Constitutional: Positive for fatigue. Negative for chills and fever.  HENT: Positive for congestion, sinus pressure and sinus pain. Negative for sore throat.   Respiratory: Positive for cough and wheezing.   Cardiovascular: Negative for chest pain.       Objective:   Physical Exam  Constitutional: She appears well-developed and well-nourished.  Cardiovascular: Normal rate and regular rhythm.  Pulmonary/Chest: Effort normal. She has wheezes. She has no rales.  Patient some mild diffuse wheezes. No rales. Pulse ox 98%. Normal respiratory rate. No retractions       Assessment:     Progressive bilateral maxillary sinusitis symptoms for almost 3 weeks duration. She has some mild reactive airway changes on exam    Plan:     -Prednisone taper over the next week -Augmentin 875 mg twice daily for 10 days -Follow-up in 2 weeks if no better  Eulas Post MD [facility] at [facility]

## 2017-09-09 NOTE — Patient Instructions (Signed)
Consider probiotic such as Activia.

## 2017-09-16 ENCOUNTER — Other Ambulatory Visit: Payer: Self-pay | Admitting: Cardiovascular Disease

## 2017-09-16 MED ORDER — NITROGLYCERIN 0.4 MG SL SUBL: 0.4000 mg | SUBLINGUAL_TABLET | SUBLINGUAL | 1 refills | Status: DC | PRN

## 2017-09-26 ENCOUNTER — Ambulatory Visit: Payer: BLUE CROSS/BLUE SHIELD | Admitting: Primary Care

## 2017-09-26 ENCOUNTER — Other Ambulatory Visit: Payer: Self-pay | Admitting: Primary Care

## 2017-09-26 ENCOUNTER — Encounter: Payer: Self-pay | Admitting: Primary Care

## 2017-09-26 VITALS — BP 122/76 | HR 87 | Temp 98.2°F | Ht 64.0 in | Wt 187.0 lb

## 2017-09-26 DIAGNOSIS — E785 Hyperlipidemia, unspecified: Secondary | ICD-10-CM | POA: Diagnosis not present

## 2017-09-26 DIAGNOSIS — R739 Hyperglycemia, unspecified: Secondary | ICD-10-CM

## 2017-09-26 DIAGNOSIS — R05 Cough: Secondary | ICD-10-CM | POA: Diagnosis not present

## 2017-09-26 DIAGNOSIS — I10 Essential (primary) hypertension: Secondary | ICD-10-CM | POA: Diagnosis not present

## 2017-09-26 DIAGNOSIS — R058 Other specified cough: Secondary | ICD-10-CM | POA: Insufficient documentation

## 2017-09-26 DIAGNOSIS — I2510 Atherosclerotic heart disease of native coronary artery without angina pectoris: Secondary | ICD-10-CM | POA: Diagnosis not present

## 2017-09-26 LAB — COMPREHENSIVE METABOLIC PANEL
ALBUMIN: 4.1 g/dL (ref 3.5–5.2)
ALT: 21 U/L (ref 0–35)
AST: 16 U/L (ref 0–37)
Alkaline Phosphatase: 62 U/L (ref 39–117)
BILIRUBIN TOTAL: 0.5 mg/dL (ref 0.2–1.2)
BUN: 14 mg/dL (ref 6–23)
CALCIUM: 9.1 mg/dL (ref 8.4–10.5)
CHLORIDE: 103 meq/L (ref 96–112)
CO2: 29 mEq/L (ref 19–32)
CREATININE: 0.88 mg/dL (ref 0.40–1.20)
GFR: 70.11 mL/min (ref >60.00)
Glucose, Bld: 118 mg/dL — ABNORMAL HIGH (ref 70–99)
Potassium: 4.3 mEq/L (ref 3.5–5.1)
Sodium: 140 mEq/L (ref 135–145)
Total Protein: 7.3 g/dL (ref 6.0–8.3)

## 2017-09-26 LAB — LIPID PANEL
CHOL/HDL RATIO: 2
CHOLESTEROL: 177 mg/dL (ref 0–200)
HDL: 79.2 mg/dL (ref >39.00)
LDL Cholesterol: 86 mg/dL (ref 0–99)
NonHDL: 97.33
TRIGLYCERIDES: 59 mg/dL (ref 0.0–149.0)
VLDL: 11.8 mg/dL (ref 0.0–40.0)

## 2017-09-26 MED ORDER — BENZONATATE 200 MG PO CAPS: 200.0000 mg | ORAL_CAPSULE | Freq: Three times a day (TID) | ORAL | 0 refills | Status: DC | PRN

## 2017-09-26 NOTE — Progress Notes (Signed)
Subjective:    Patient ID: Caroline [name], female    DOB: 1960/10/16, 57 y.o.   MRN: [id number]  HPI  Caroline [name] is a 57 year old female who presents today to transfer care from [place] location.   1) CAD/Hypertension: Currently managed on diltiazem 300 mg, aspirin 81 mg, isosorbide dinitrate 30 mg TID, Ranexa 500 mg BID. She currently follows with cardiology and sees them every 6-8 months.   2) Hyperlipidemia: Currently managed on atorvastatin 20 mg. Her last documented lipid panel was in June 2017 with TC of 160 and LDL of 76. She is fasting today.   3) Acute Bronchitis/Sinusitis/Recurrent cough: Evaluated last on 09/09/17 with a three week history of sinus symptoms and cough. She was initially treated through an e-visit in early January 2019 and was prescribed Zithromax for "acute bronchitis". During her visit on 09/09/17 she endorsed no improvement in symptoms, facial pain, intermittent vertigo, ear, pain. She was prescribed a 10 day course of Augmentin, prednisone taper.   She continues to feel tired/fatigued and has a recurrent cough that is worse at night. The cough has been present since mid December 2018. She'll wake up 2-3 times nightly due to the cough. She quit smoking in 2015, did smoke for years prior to then. She does have two [place] dogs for which shed their hair quite often, she thinks this is the main cause of her cough. She denies esophageal reflux regularly as she avoids trigger foods. She denies wheezing, shortness of breath.   Review of Systems  Constitutional: Positive for fatigue. Negative for fever.  HENT: Negative for congestion and sinus pressure.   Respiratory: Negative for shortness of breath and wheezing.        Recurrent cough  Cardiovascular: Negative for chest pain.  Neurological: Negative for dizziness and headaches.       Past Medical History:  Diagnosis Date  . CAD (coronary artery disease)    has documented coronary vasospasm but with  underlying 40% prox LAD, 30% prox LCX and 70 to 80% proximal 1st DX and 30% RCA; recurrent vasospasm with subsequent cath in Jan 2013.   Marland Kitchen Coronary vasospasm ([place])   . Esophagitis   . GERD (gastroesophageal reflux disease)   . Hyperlipidemia   . Hypertension   . IBS (irritable bowel syndrome)   . Palpitations    tachycardia on holter; treated with beta blockers     Social History   Socioeconomic History  . Marital status: Married    Spouse name: Not on file  . Number of children: 2  . Years of education: Not on file  . Highest education level: Not on file  Social Needs  . Financial resource strain: Not on file  . Food insecurity - worry: Not on file  . Food insecurity - inability: Not on file  . Transportation needs - medical: Not on file  . Transportation needs - non-medical: Not on file  Occupational History  . Occupation: Unemployed  Tobacco Use  . Smoking status: Former Smoker    Packs/day: 0.25    Years: 30.00    Pack years: 7.50    Types: Cigarettes    Last attempt to quit: 05/30/2014    Years since quitting: 3.3  . Smokeless tobacco: Never Used  Substance and Sexual Activity  . Alcohol use: Yes    Alcohol/week: 1.2 oz    Types: 2 Standard drinks or equivalent per week    Comment: occ  . Drug use: No  .  Sexual activity: Yes  Other Topics Concern  . Not on file  Social History Narrative   Married.   2 children.   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   She enjoys being with her dogs, shopping.     Past Surgical History:  Procedure Laterality Date  . APPENDECTOMY  1976  . BREAST EXCISIONAL BIOPSY Right    benign  . BREAST SURGERY  2002   biopsy  . CARDIAC CATHETERIZATION  2011  . CARDIAC CATHETERIZATION  Sept 2012   With documented vasospasm but with residual disease; managed medically  . CHOLECYSTECTOMY  1982  . LEFT HEART CATHETERIZATION WITH CORONARY ANGIOGRAM N/A 09/14/2011   Procedure: LEFT HEART CATHETERIZATION WITH CORONARY ANGIOGRAM;  Surgeon:  Burnell Blanks, MD;  Location: [facility] CATH LAB;  Service: Cardiovascular;  Laterality: N/A;  . UPPER GASTROINTESTINAL ENDOSCOPY  01/11/2011   esophagitis    Family History  Problem Relation Age of Onset  . Breast cancer Mother 50  . Heart disease Mother        died CHF 50  . Diabetes Mother   . Prostate cancer Father   . Hyperlipidemia Father   . Heart disease Father        s/p cabg - alive  . Colon cancer Unknown        First cousin on dads side     No Known Allergies  Current Outpatient Medications on File Prior to Visit  Medication Sig Dispense Refill  . aspirin 81 MG tablet Take 81 mg by mouth daily.      Marland Kitchen atorvastatin (LIPITOR) 20 MG tablet TAKE 1 TABLET (20 MG TOTAL) BY MOUTH DAILY. 90 tablet 1  . Cholecalciferol (VITAMIN D) 2000 units CAPS Take 2000 units once per day. 30 capsule   . diltiazem (CARDIZEM CD) 300 MG 24 hr capsule Take 1 capsule (300 mg total) by mouth daily. 90 capsule 2  . isosorbide dinitrate (ISORDIL) 30 MG tablet TAKE 1 TABLET (30 MG TOTAL) BY MOUTH 3 (THREE) TIMES DAILY. 270 tablet 2  . RANEXA 500 MG 12 hr tablet TAKE 1 TABLET (500 MG TOTAL) BY MOUTH 2 (TWO) TIMES DAILY. 180 tablet 2  . acetaminophen (TYLENOL) 500 MG tablet Take 500 mg by mouth every 6 (six) hours as needed. For pain    . nitroGLYCERIN (NITROSTAT) 0.4 MG SL tablet Place 1 tablet (0.4 mg total) under the tongue every 5 (five) minutes as needed for chest pain. X 3 doses (Patient not taking: Reported on 09/26/2017) 75 tablet 1   No current facility-administered medications on file prior to visit.     BP 122/76   Pulse 87   Temp 98.2 F (36.8 C) (Oral)   Ht 5\' 4"  (1.626 m)   Wt 187 lb (84.8 kg)   LMP 07/28/2010   SpO2 98%   BMI 32.10 kg/m    Objective:   Physical Exam  Constitutional: She appears well-nourished. She does not appear ill.  HENT:  Right Ear: Tympanic membrane and ear canal normal.  Left Ear: Tympanic membrane and ear canal normal.  Nose: Right sinus exhibits  no maxillary sinus tenderness and no frontal sinus tenderness. Left sinus exhibits no maxillary sinus tenderness and no frontal sinus tenderness.  Mouth/Throat: Oropharynx is clear and moist.  Eyes: Conjunctivae are normal.  Neck: Neck supple.  Cardiovascular: Normal rate and regular rhythm.  Pulmonary/Chest: Effort normal and breath sounds normal. She has no wheezes. She has no rales.  Lymphadenopathy:    She has no  cervical adenopathy.  Skin: Skin is warm and dry.          Assessment & Plan:

## 2017-09-26 NOTE — Assessment & Plan Note (Signed)
Stable in the office today, continue current regimen. BMP pending. 

## 2017-09-26 NOTE — Assessment & Plan Note (Signed)
Repeat lipid panel pending today. Discussed the importance of a healthy diet and regular exercise in order for weight loss, and to reduce the risk of any potential medical problems.

## 2017-09-26 NOTE — Assessment & Plan Note (Signed)
Since mid December 2018. Discussed causes of recurrent cough including allergies, COPD/asthma, silent GERD. Will start with allergy treatment first, consider treatment for COPD/asthma next. She will update in 2 weeks. No signs of bacterial or viral involvement.

## 2017-09-26 NOTE — Assessment & Plan Note (Signed)
Following with cardiology. Continue aspirin, statin, and hypertension control. BMP pending.

## 2017-09-26 NOTE — Patient Instructions (Signed)
Stop by the lab prior to leaving today. I will notify you of your results once received.   Start cetirizine (Zyrtec) for allergies. Take 1 tablet by mouth at bedtime.   Please notify me if your cough persists despite taking the allergy medication.  It was a pleasure to meet you today! Please don't hesitate to call or message me with any questions. Welcome to [facility] at [facility]!

## 2017-09-27 ENCOUNTER — Other Ambulatory Visit (INDEPENDENT_AMBULATORY_CARE_PROVIDER_SITE_OTHER): Payer: BLUE CROSS/BLUE SHIELD

## 2017-09-27 ENCOUNTER — Other Ambulatory Visit: Payer: Self-pay | Admitting: Primary Care

## 2017-09-27 DIAGNOSIS — R739 Hyperglycemia, unspecified: Secondary | ICD-10-CM | POA: Diagnosis not present

## 2017-09-27 DIAGNOSIS — R7303 Prediabetes: Secondary | ICD-10-CM

## 2017-09-27 LAB — HEMOGLOBIN A1C: Hgb A1c MFr Bld: 6.2 % (ref 4.6–6.5)

## 2017-11-11 ENCOUNTER — Encounter: Payer: Self-pay | Admitting: Cardiovascular Disease

## 2017-11-20 NOTE — Progress Notes (Signed)
Chief Complaint  Patient presents with  . Follow-up    CAD   History of Present Illness: 57 yo female with history of CAD, Prinzmetal angina, HLD, IBS, GERD who is here today for cardiac follow up. She had been followed by Dr. Mare Ferrari prior to his retirement. She has Prinzmetal angina controlled with Diltiazem, Isordil and Ranexa. Last cardiac cath in January 2013 at which time she was noted to have spasm in the ostial LAD with mild calcification, ostial Diagonal 50% stenosis, 20% mid Circumflex stenosis, 30% OM stenosis, 30% mid RCA stenosis. LVEF 55% by cath. ABI normal September 2017. Nuclear stress test in February 2018 with no ischemia.   She is here today for follow up. The patient denies any chest pain, dyspnea, palpitations, lower extremity edema, orthopnea, PND, dizziness, near syncope or syncope.   Primary Care Physician: Pleas Koch, NP  Past Medical History:  Diagnosis Date  . CAD (coronary artery disease)    has documented coronary vasospasm but with underlying 40% prox LAD, 30% prox LCX and 70 to 80% proximal 1st DX and 30% RCA; recurrent vasospasm with subsequent cath in Jan 2013.   Marland Kitchen Coronary vasospasm ([place])   . Esophagitis   . GERD (gastroesophageal reflux disease)   . Hyperlipidemia   . Hypertension   . IBS (irritable bowel syndrome)   . Palpitations    tachycardia on holter; treated with beta blockers    Past Surgical History:  Procedure Laterality Date  . APPENDECTOMY  1976  . BREAST EXCISIONAL BIOPSY Right    benign  . BREAST SURGERY  2002   biopsy  . CARDIAC CATHETERIZATION  2011  . CARDIAC CATHETERIZATION  Sept 2012   With documented vasospasm but with residual disease; managed medically  . CHOLECYSTECTOMY  1982  . LEFT HEART CATHETERIZATION WITH CORONARY ANGIOGRAM N/A 09/14/2011   Procedure: LEFT HEART CATHETERIZATION WITH CORONARY ANGIOGRAM;  Surgeon: Burnell Blanks, MD;  Location: [facility] CATH LAB;  Service: Cardiovascular;  Laterality:  N/A;  . UPPER GASTROINTESTINAL ENDOSCOPY  01/11/2011   esophagitis    Current Outpatient Medications  Medication Sig Dispense Refill  . acetaminophen (TYLENOL) 500 MG tablet Take 500 mg by mouth every 6 (six) hours as needed. For pain    . aspirin 81 MG tablet Take 81 mg by mouth daily.      . benzonatate (TESSALON) 200 MG capsule Take 1 capsule (200 mg total) by mouth 3 (three) times daily as needed for cough. 30 capsule 0  . Cholecalciferol (VITAMIN D) 2000 units CAPS Take 2000 units once per day. 30 capsule   . diltiazem (CARDIZEM CD) 300 MG 24 hr capsule Take 1 capsule (300 mg total) by mouth daily. 90 capsule 2  . isosorbide dinitrate (ISORDIL) 30 MG tablet TAKE 1 TABLET (30 MG TOTAL) BY MOUTH 3 (THREE) TIMES DAILY. 270 tablet 2  . nitroGLYCERIN (NITROSTAT) 0.4 MG SL tablet Place 1 tablet (0.4 mg total) under the tongue every 5 (five) minutes as needed for chest pain. X 3 doses 75 tablet 1  . atorvastatin (LIPITOR) 20 MG tablet Take 1 tablet (20 mg total) by mouth daily. 90 tablet 3   No current facility-administered medications for this visit.     No Known Allergies  Social History   Socioeconomic History  . Marital status: Married    Spouse name: Not on file  . Number of children: 2  . Years of education: Not on file  . Highest education level: Not on  file  Occupational History  . Occupation: Unemployed  Social Needs  . Financial resource strain: Not on file  . Food insecurity:    Worry: Not on file    Inability: Not on file  . Transportation needs:    Medical: Not on file    Non-medical: Not on file  Tobacco Use  . Smoking status: Former Smoker    Packs/day: 0.25    Years: 30.00    Pack years: 7.50    Types: Cigarettes    Last attempt to quit: 05/30/2014    Years since quitting: 3.4  . Smokeless tobacco: Never Used  Substance and Sexual Activity  . Alcohol use: Yes    Alcohol/week: 1.2 oz    Types: 2 Standard drinks or equivalent per week    Comment: occ  .  Drug use: No  . Sexual activity: Yes  Lifestyle  . Physical activity:    Days per week: Not on file    Minutes per session: Not on file  . Stress: Not on file  Relationships  . Social connections:    Talks on phone: Not on file    Gets together: Not on file    Attends religious service: Not on file    Active member of club or organization: Not on file    Attends meetings of clubs or organizations: Not on file    Relationship status: Not on file  . Intimate partner violence:    Fear of current or ex partner: Not on file    Emotionally abused: Not on file    Physically abused: Not on file    Forced sexual activity: Not on file  Other Topics Concern  . Not on file  Social History Narrative   Married.   2 children.   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   She enjoys being with her dogs, shopping.     Family History  Problem Relation Age of Onset  . Breast cancer Mother 52  . Heart disease Mother        died CHF 37  . Diabetes Mother   . Prostate cancer Father   . Hyperlipidemia Father   . Heart disease Father        s/p cabg - alive  . Colon cancer Unknown        First cousin on dads side     Review of Systems:  As stated in the HPI and otherwise negative.   BP 126/70   Pulse 94   Ht 5\' 4"  (1.626 m)   Wt 187 lb 12.8 oz (85.2 kg)   LMP 07/28/2010   SpO2 97%   BMI 32.24 kg/m   Physical Examination: General: Well developed, well nourished, NAD  HEENT: OP clear, mucus membranes moist  SKIN: warm, dry. No rashes. Neuro: No focal deficits  Musculoskeletal: Muscle strength 5/5 all ext  Psychiatric: Mood and affect normal  Neck: No JVD, no carotid bruits, no thyromegaly, no lymphadenopathy.  Lungs:Clear bilaterally, no wheezes, rhonci, crackles Cardiovascular: Regular rate and rhythm. No murmurs, gallops or rubs. Abdomen:Soft. Bowel sounds present. Non-tender.  Extremities: No lower extremity edema. Pulses are 2 + in the bilateral DP/PT.  Cardiac cath September 12, 2011: Left main: No obstructive disease. As noted above, pt with intense vasospasm at beginning of case.  Left Anterior Descending Artery: Ostial spasm however after NTG given, there is minimal obstructive disease in the ostium with mild calcification. The diagonal has ostial 50% stenosis, unchanged from previous cath.  Circumflex  Artery: During initial injections, there is a 99% mid stenosis, however, after NTG given this area responded well with minimal 20% stenosis. The proximal portion of the OM 1 also had 30% stenosis.  Right Coronary Artery: Large, dominant vessel with 30% mid stenosis.  Left Ventricular Angiogram: LVEF=55%.   EKG:  EKG is not ordered today. The ekg ordered today demonstrates  EKG from July 2018 reviewed by me and shows sinus tach.   Recent Labs: 09/26/2017: ALT 21; BUN 14; Creatinine, Ser 0.88; Potassium 4.3; Sodium 140   Lipid Panel    Component Value Date/Time   CHOL 177 09/26/2017 0910   TRIG 59.0 09/26/2017 0910   HDL 79.20 09/26/2017 0910   CHOLHDL 2 09/26/2017 0910   VLDL 11.8 09/26/2017 0910   LDLCALC 86 09/26/2017 0910     Wt Readings from Last 3 Encounters:  11/21/17 187 lb 12.8 oz (85.2 kg)  09/26/17 187 lb (84.8 kg)  09/09/17 183 lb 4.8 oz (83.1 kg)     Other studies Reviewed: Additional studies/ records that were reviewed today include: . Review of the above records demonstrates:   Assessment and Plan:   1. CAD with stable angina/suspected coronary vasospasm: She has moderate CAD by cath in 2013 with documented coronary vasospasm. No recent chest pain. Will continue Isordil and Diltiazem. I will stop Ranexa due to cost. Continue statin.   2. Hyperlipidemia: Lipids followed in primary care. HDL is 79 and LDL is 86. She has been taking Lipitor 10 mg daily. I will increase her Lipitor to 20 mg daily and repeat lipids and LFTs in 12 weeks. Continue statin.     Current medicines are reviewed at length with the patient today.  The patient does  not have concerns regarding medicines.  The following changes have been made:  no change  Labs/ tests ordered today include:   Orders Placed This Encounter  Procedures  . Lipid Profile  . Hepatic function panel    Disposition:   FU with me in 12 months  Signed, Lauree Chandler, MD 11/21/2017 9:52 AM    [facility] [place], [place], [place]  [address] Phone: [phone]; Fax: [phone]

## 2017-11-21 ENCOUNTER — Ambulatory Visit: Payer: BLUE CROSS/BLUE SHIELD | Admitting: Cardiovascular Disease

## 2017-11-21 ENCOUNTER — Encounter: Payer: Self-pay | Admitting: Cardiovascular Disease

## 2017-11-21 VITALS — BP 126/70 | HR 94 | Ht 64.0 in | Wt 187.8 lb

## 2017-11-21 DIAGNOSIS — E785 Hyperlipidemia, unspecified: Secondary | ICD-10-CM

## 2017-11-21 DIAGNOSIS — I201 Angina pectoris with documented spasm: Secondary | ICD-10-CM

## 2017-11-21 DIAGNOSIS — I2510 Atherosclerotic heart disease of native coronary artery without angina pectoris: Secondary | ICD-10-CM

## 2017-11-21 MED ORDER — ATORVASTATIN CALCIUM 20 MG PO TABS: 20.0000 mg | ORAL_TABLET | Freq: Every day | ORAL | 3 refills | Status: DC

## 2017-11-21 NOTE — Patient Instructions (Signed)
Medication Instructions:  Your physician has recommended you make the following change in your medication:  Stop Ranexa. Increase atorvastatin to 20 mg by mouth daily.    Labwork: Your physician recommends that you return for lab work on June 28,2019.  This will be fasting (lipid and liver profiles).  The lab opens at 7:30 AM   Testing/Procedures: none  Follow-Up: Your physician recommends that you schedule a follow-up appointment in: 12 months. Please call our office in about 9 months to schedule this appointment    Any Other Special Instructions Will Be Listed Below (If Applicable).     If you need a refill on your cardiac medications before your next appointment, please call your pharmacy.

## 2018-01-25 IMAGING — CT CT ANGIO HEAD
3 of 13 series · 16 of 47 positions shown · IV contrast (APPLIED)
Comparison: CT HEAD January 23, 2014

CLINICAL DATA: Dizziness, unsteady gait, LEFT arm pain. Chest pain
and vertigo. Assess for vertebral artery dissection. History of
hypertension and hyperlipidemia.

EXAM:
CT ANGIOGRAPHY HEAD
TECHNIQUE: Multidetector CT imaging of the head was performed using the
standard protocol during bolus administration of intravenous
contrast. Multiplanar CT image reconstructions and MIPs were
obtained to evaluate the vascular anatomy.
CONTRAST:  50 cc Isovue 370

[Series 7: ax thin · axial · 0.33mm/px · z∈[-145,-27]mm · 11 of 146 slices shown]
[im 12/146  brain]
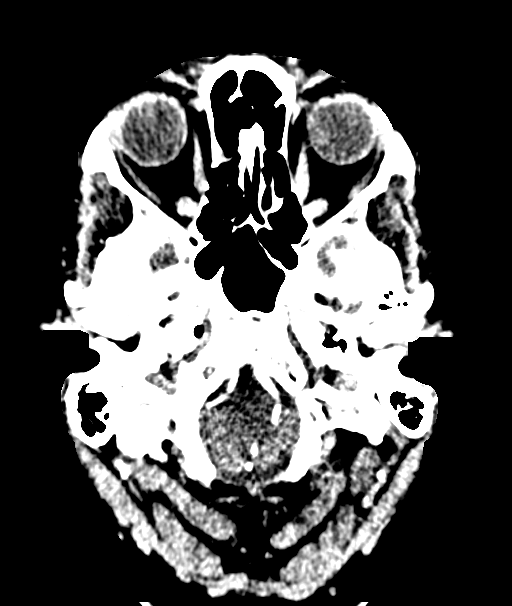
[im 23/146  bone]
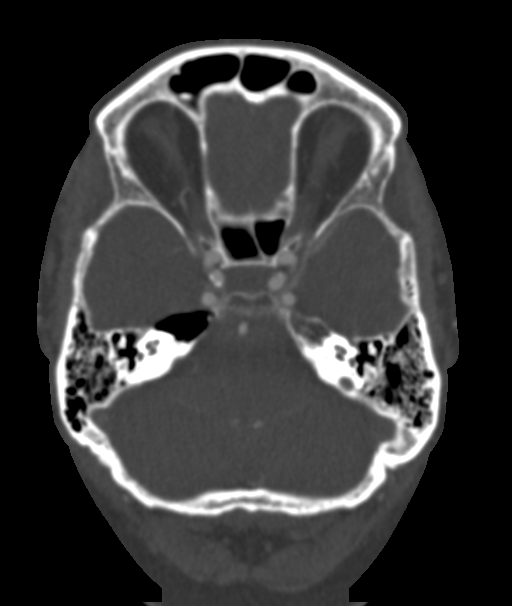
[im 34/146  brain]
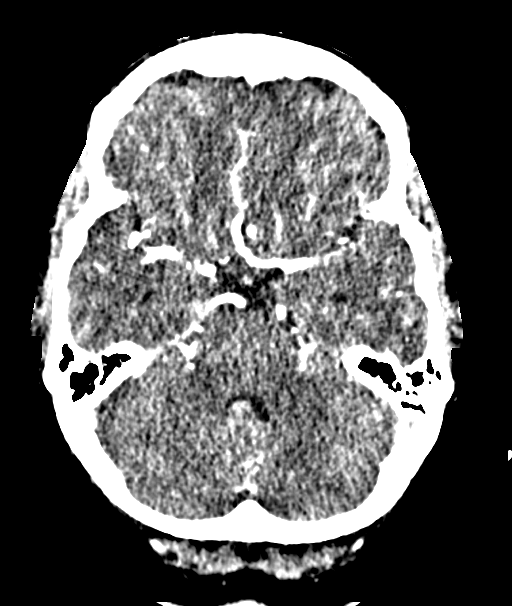
[im 45/146  bone]
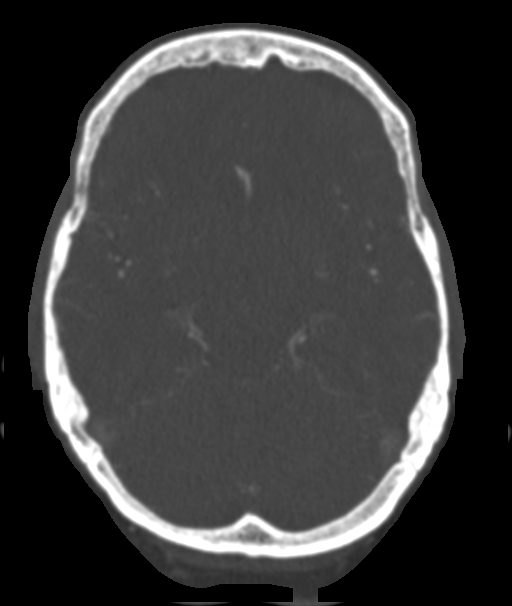
[im 56/146  brain]
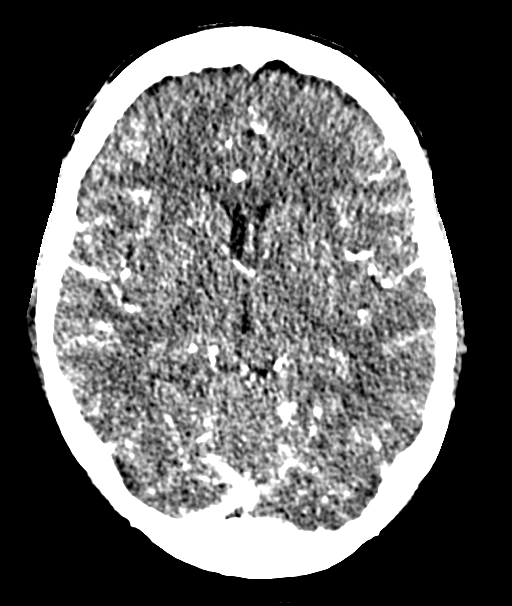
[im 79/146  bone]
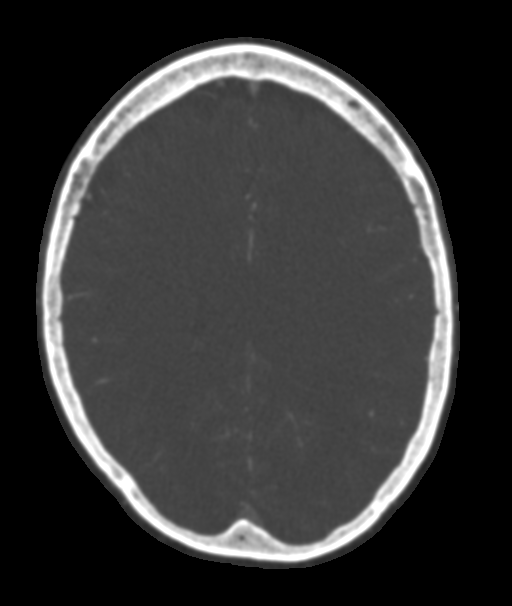
[im 90/146  brain]
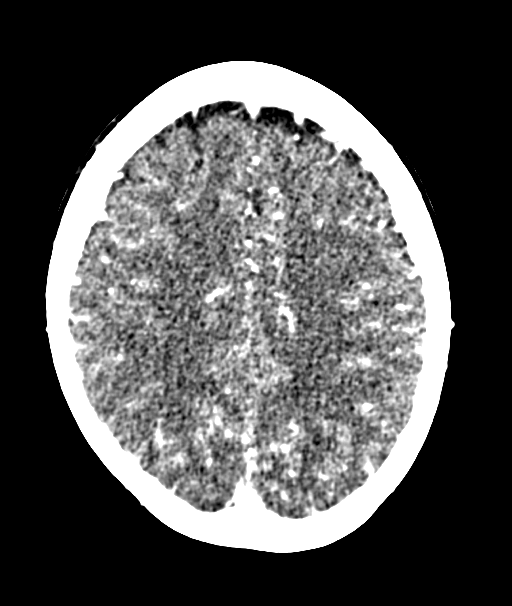
[im 101/146  bone]
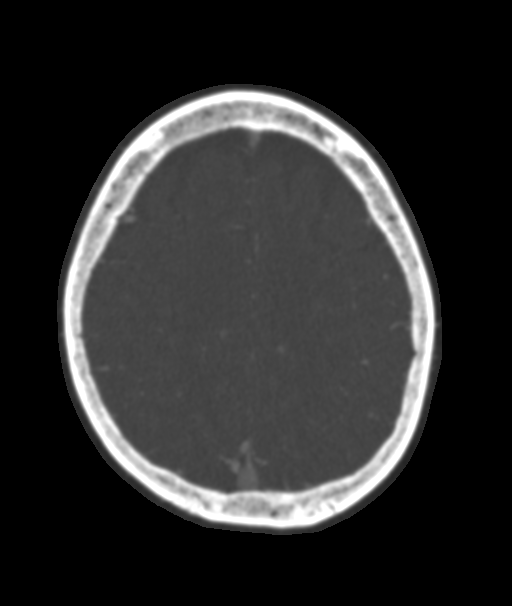
[im 112/146  brain]
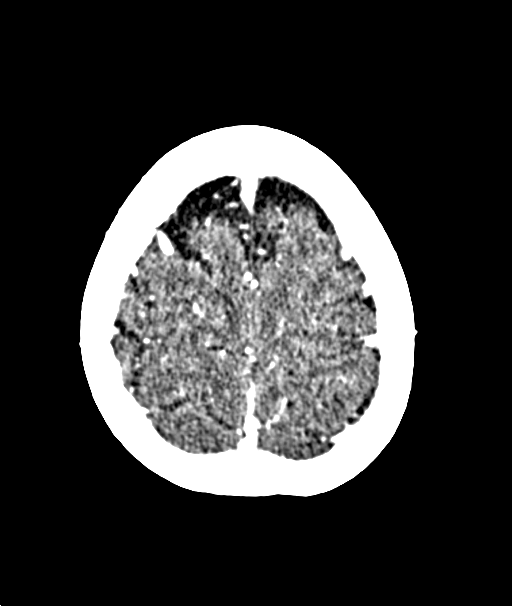
[im 123/146  bone]
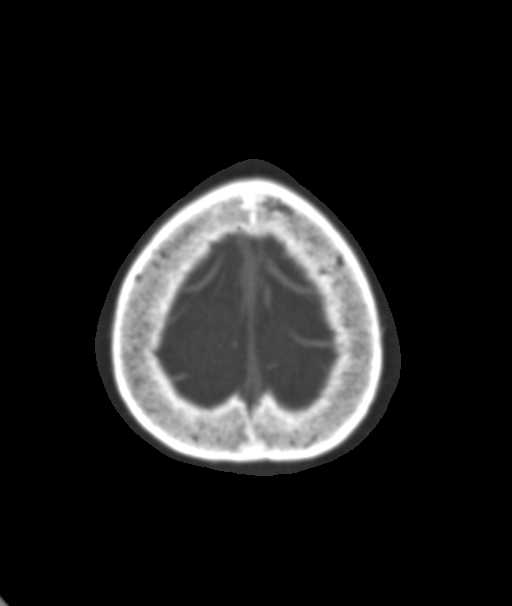
[im 134/146  brain]
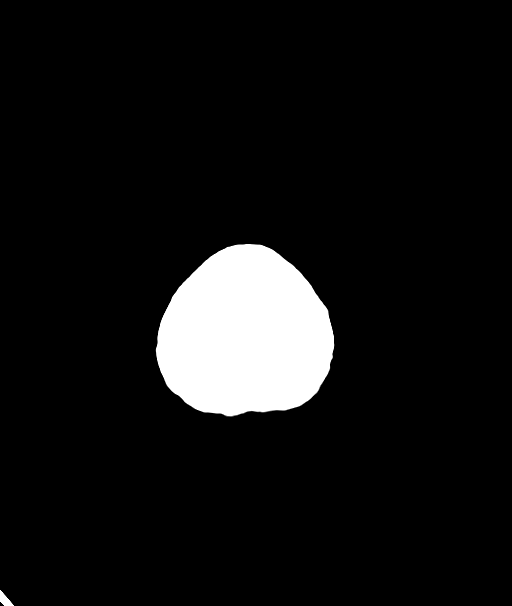

[Series 9: cor thin · coronal · 0.29mm/px · 3 of 188 slices shown]
[im 47/188  brain]
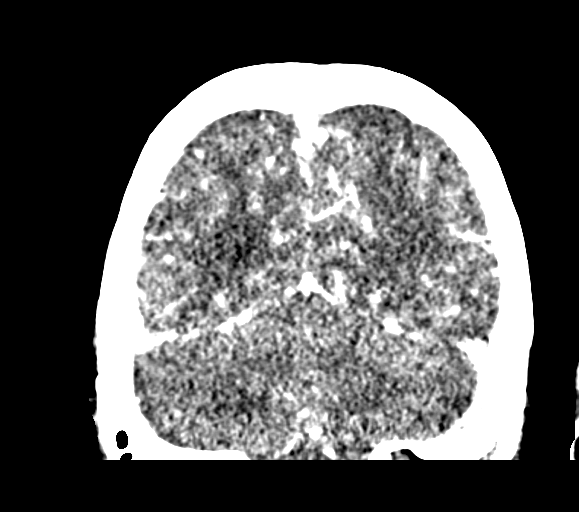
[im 94/188  brain]
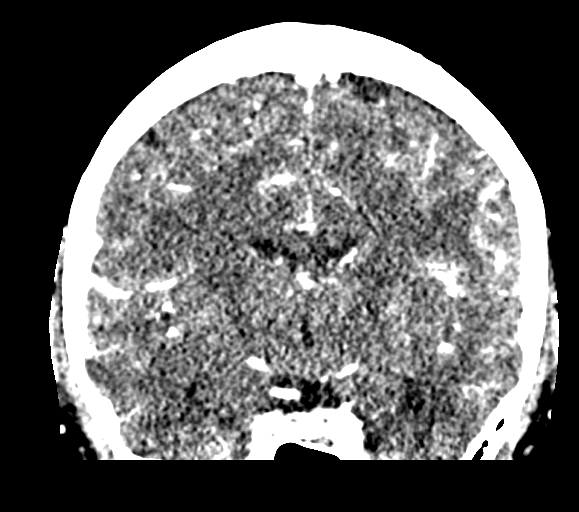
[im 141/188  brain]
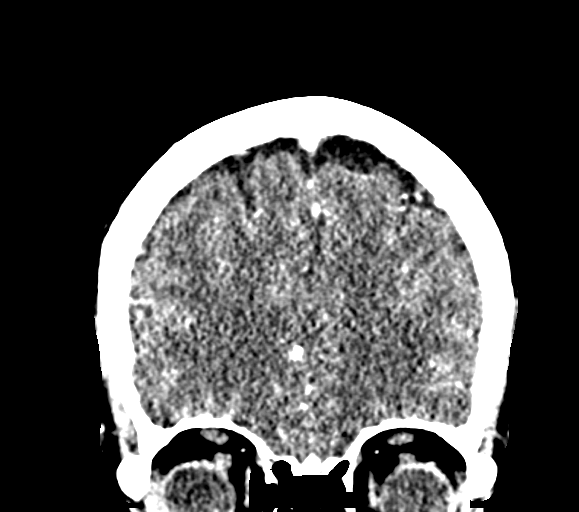

[Series 11: sag thin · sagittal · 0.29mm/px · 2 of 159 slices shown]
[im 53/159  brain]
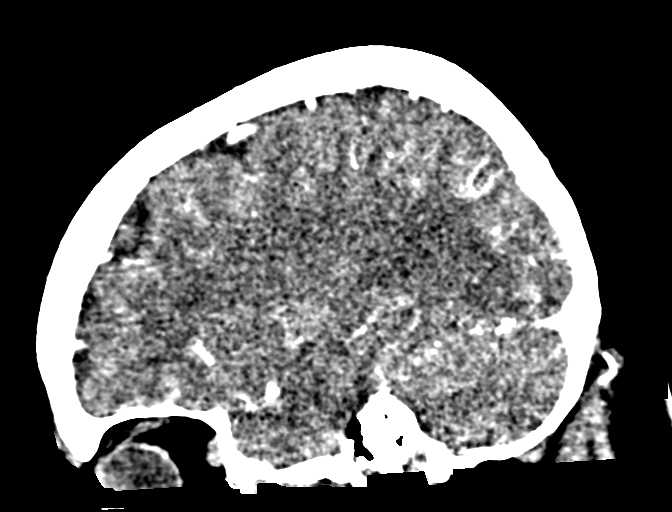
[im 106/159  brain]
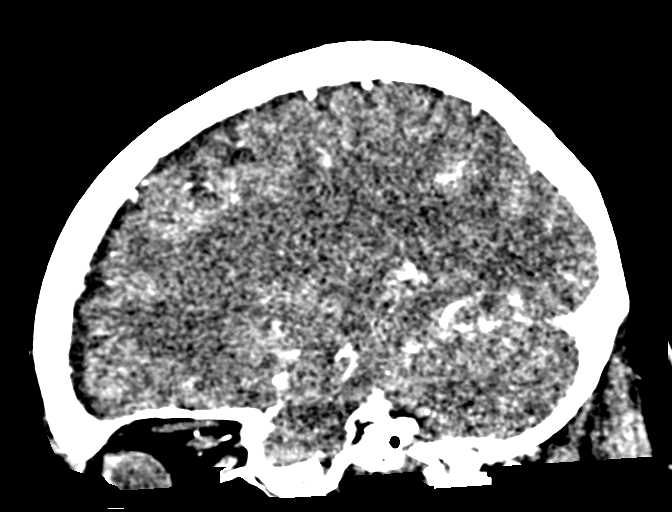

[16 of 47 positions shown; findings below may reference images not displayed]

FINDINGS: CT HEAD

BRAIN: The ventricles and sulci are normal. No intraparenchymal
hemorrhage, mass effect nor midline shift. Slightly progressed
patchy supratentorial white matter hypodensities. No acute large
vascular territory infarcts. No abnormal extra-axial fluid
collections. Basal cisterns are patent.

VASCULAR: Mild calcific atherosclerosis the carotid siphons.

SKULL/SOFT TISSUES: No skull fracture. No significant soft tissue
swelling.

ORBITS/SINUSES: The included ocular globes and orbital contents are
normal.Mild paranasal sinus mucosal thickening

OTHER: None.

CTA HEAD

ANTERIOR CIRCULATION: Normal appearance of the cervical internal
carotid arteries, petrous, cavernous and supra clinoid internal
carotid arteries. Widely patent anterior communicating artery.
Normal appearance of the anterior and middle cerebral arteries.

No large vessel occlusion, hemodynamically significant stenosis,
dissection, luminal irregularity, contrast extravasation or
aneurysm.

POSTERIOR CIRCULATION: LEFT vertebral artery is dominant with normal
appearance of the vertebral arteries, vertebrobasilar junction and
basilar artery, as well as main branch vessels. Tiny probable
infundibulum versus a atherosclerosis proximal RIGHT vertebral
artery. Normal appearance of the posterior cerebral arteries. Robust
LEFT posterior communicating artery.

No large vessel occlusion, hemodynamically significant stenosis,
dissection, luminal irregularity, contrast extravasation or
aneurysm.

VENOUS SINUSES: Major dural venous sinuses are patent though not
tailored for evaluation on this angiographic examination.

ANATOMIC VARIANTS: None.

DELAYED PHASE: No abnormal intracranial enhancement.
IMPRESSION: CT HEAD:  No acute intracranial process.

Mild to moderate white matter changes compatible chronic small
vessel ischemic disease, advanced for age.

CTA HEAD: Mild atherosclerosis without emergent large vessel
occlusion or severe stenosis. No intradural vertebral artery
dissection.

## 2018-01-25 IMAGING — CT CT ANGIO CHEST
3 of 7 series · 17 of 36 positions shown · IV contrast (APPLIED)
Comparison: Chest radiograph 09/18/2016

CLINICAL DATA: Acute onset dizziness and unsteady gait. , RIGHT arm
pain. Concern for aortic dissection

EXAM:
CT ANGIOGRAPHY CHEST WITH and without CONTRAST
TECHNIQUE: Multidetector CT imaging of the chest was performed using the
standard protocol during bolus administration of intravenous
contrast. Multiplanar CT image reconstructions and MIPs were
obtained to evaluate the vascular anatomy.
CONTRAST:  Seventy-five mL Isovue

[Series 6: axial arterial · axial · arterial · 0.67mm/px · z∈[-575,-308]mm · 11 of 107 slices shown]
[im 9/107  lung]
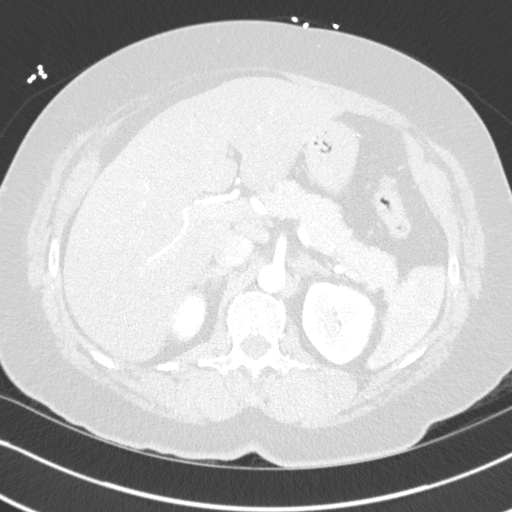
[im 18/107  mediastinal]
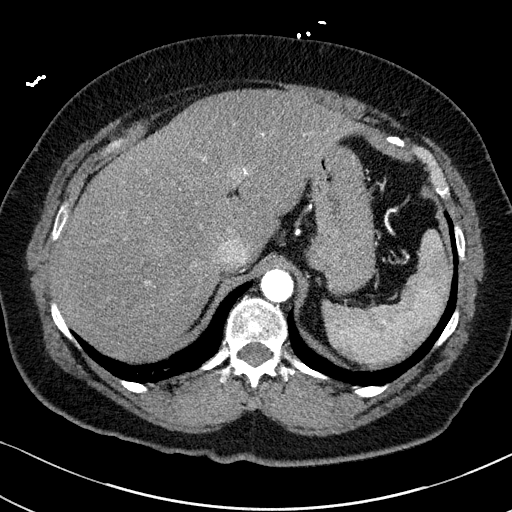
[im 27/107  lung]
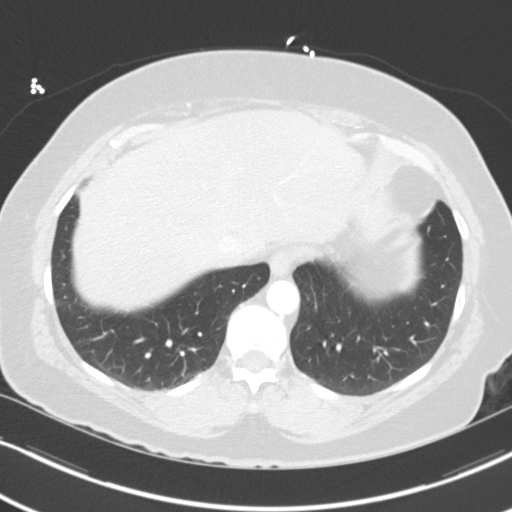
[im 36/107  mediastinal]
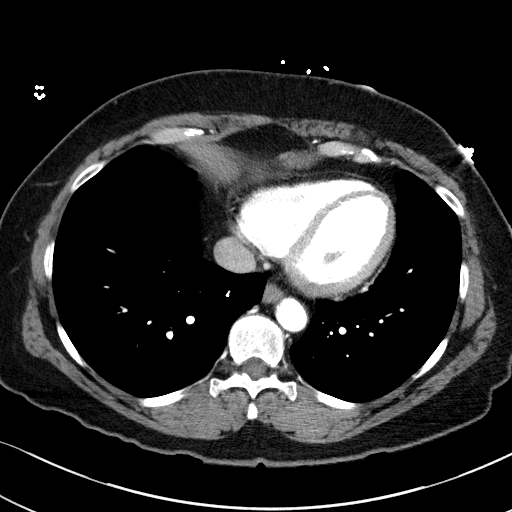
[im 45/107  lung]
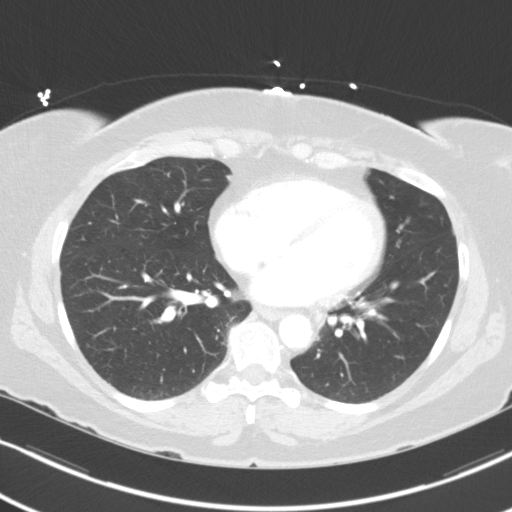
[im 54/107  mediastinal]
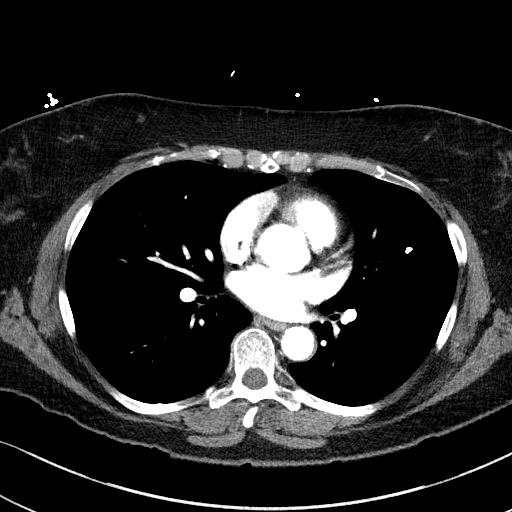
[im 62/107  lung]
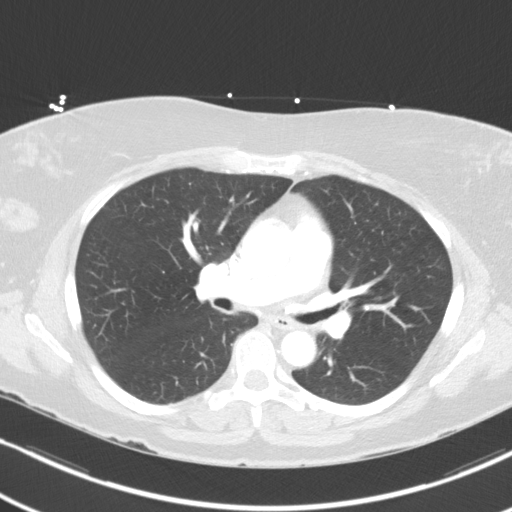
[im 71/107  mediastinal]
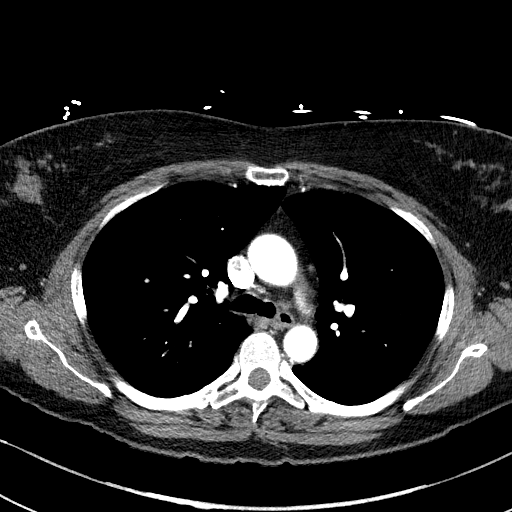
[im 80/107  lung]
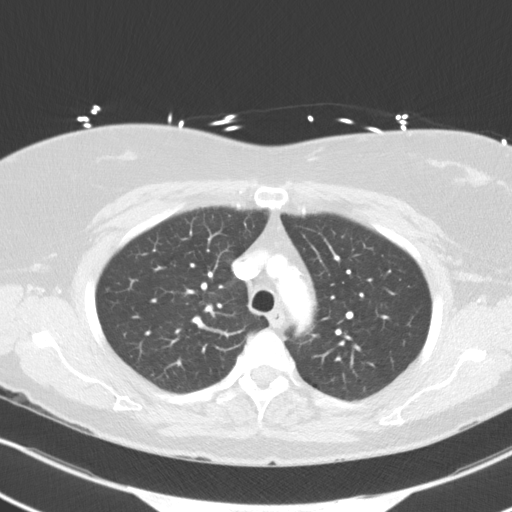
[im 89/107  mediastinal]
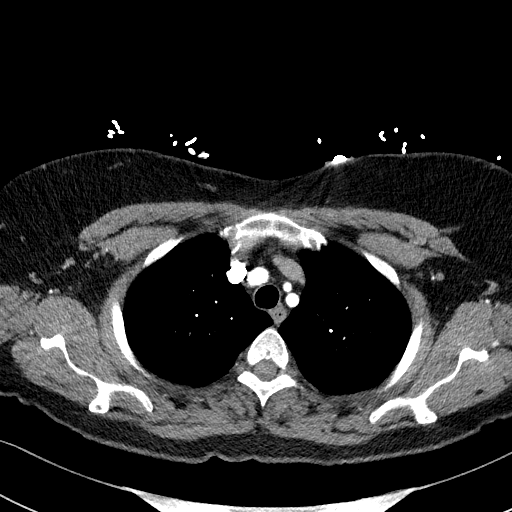
[im 98/107  lung]
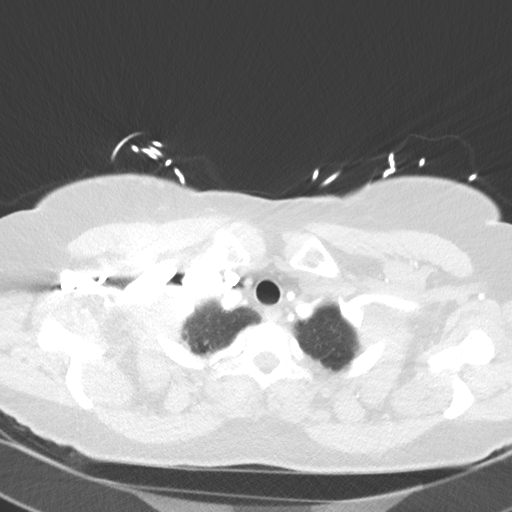

[Series 7: lung · axial · 0.67mm/px · z∈[-516,-331]mm · 5 of 57 slices shown]
[im 10/57  mediastinal]
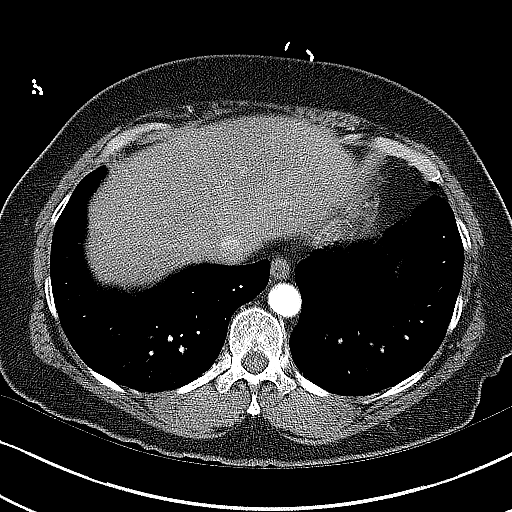
[im 19/57  mediastinal]
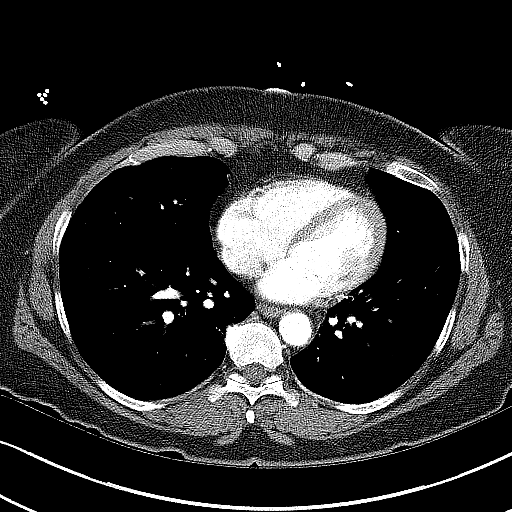
[im 29/57  mediastinal]
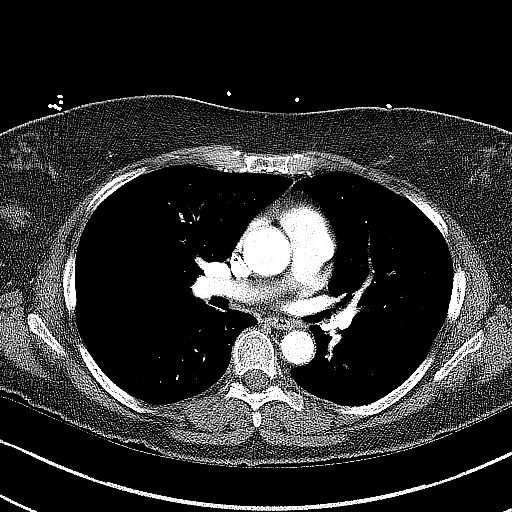
[im 38/57  mediastinal]
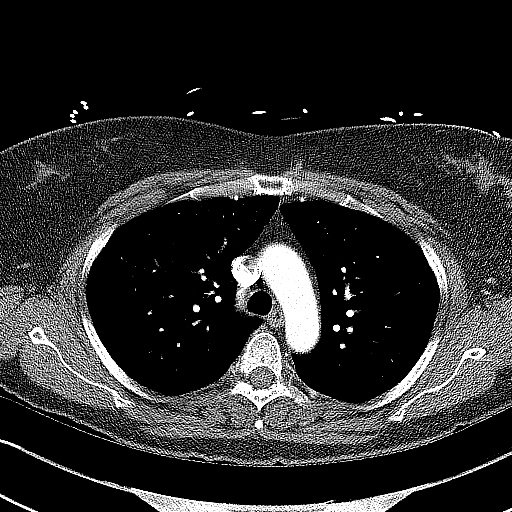
[im 47/57  mediastinal]
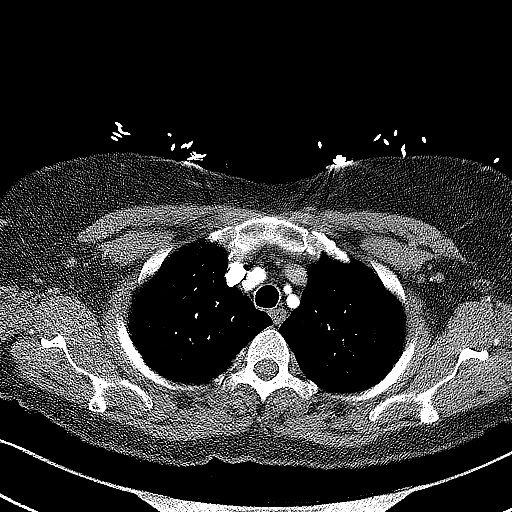

[Series 9: coronals · coronal · 0.63mm/px · 1 of 130 slices shown]
[im 65/130  mediastinal]
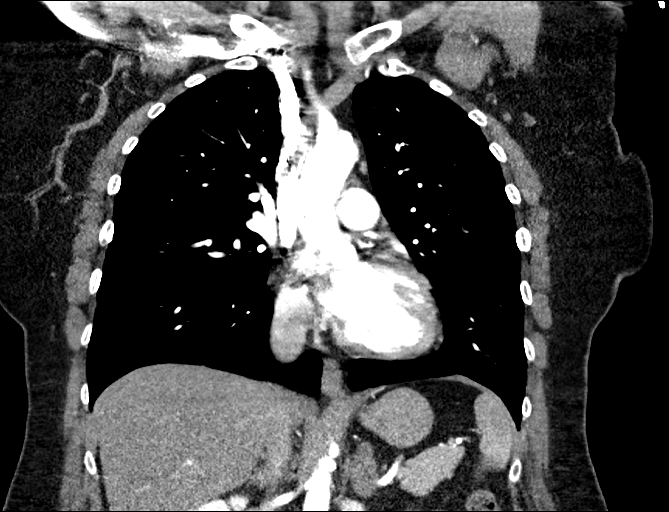

[17 of 36 positions shown; findings below may reference images not displayed]

FINDINGS: Cardiovascular: Noncontrast imaging demonstrates no internal
hematoma within the aorta. Intimal calcification the aortic arch.
Coronary calcifications noted.

Contrast-enhanced imaging demonstrates no aortic dissection or
aneurysm. Great vessels are normal.

No pulmonary embolism. No pericardial fluid. No mediastinal
hematoma.

Mediastinum/Nodes: No axillary or supraclavicular adenopathy. No
mediastinal hilar adenopathy.

Lungs/Pleura: No pulmonary infarction or pneumonia. No pulmonary
edema or pleural fluid. No pneumothorax. Airways normal.

Upper Abdomen: Limited view of the liver, kidneys, pancreas are
unremarkable. Normal adrenal glands.

Musculoskeletal: No aggressive osseous lesion.

Review of the MIP images confirms the above findings.
IMPRESSION: 1. No aortic dissection.
2. New acute pulmonary embolism.
3. Coronary artery calcification and aortic atherosclerotic
calcification.

## 2018-02-24 ENCOUNTER — Other Ambulatory Visit: Payer: BLUE CROSS/BLUE SHIELD | Admitting: *Deleted

## 2018-02-24 DIAGNOSIS — E785 Hyperlipidemia, unspecified: Secondary | ICD-10-CM

## 2018-02-24 LAB — LIPID PANEL
CHOL/HDL RATIO: 1.9 ratio (ref 0.0–4.4)
Cholesterol, Total: 136 mg/dL (ref 100–199)
HDL: 72 mg/dL (ref >39)
LDL Calculated: 52 mg/dL (ref 0–99)
Triglycerides: 62 mg/dL (ref 0–149)
VLDL Cholesterol Cal: 12 mg/dL (ref 5–40)

## 2018-02-24 LAB — HEPATIC FUNCTION PANEL
ALT: 22 IU/L (ref 0–32)
AST: 18 IU/L (ref 0–40)
Albumin: 4.5 g/dL (ref 3.5–5.5)
Alkaline Phosphatase: 80 IU/L (ref 39–117)
BILIRUBIN TOTAL: 0.3 mg/dL (ref 0.0–1.2)
BILIRUBIN, DIRECT: 0.11 mg/dL (ref 0.00–0.40)
TOTAL PROTEIN: 7 g/dL (ref 6.0–8.5)

## 2018-02-27 ENCOUNTER — Other Ambulatory Visit: Payer: Self-pay | Admitting: Cardiovascular Disease

## 2018-04-07 ENCOUNTER — Ambulatory Visit: Payer: BLUE CROSS/BLUE SHIELD | Admitting: Family Medicine

## 2018-04-07 ENCOUNTER — Encounter: Payer: Self-pay | Admitting: Family Medicine

## 2018-04-07 VITALS — BP 138/90 | HR 94 | Temp 98.0°F | Ht 64.0 in | Wt 184.0 lb

## 2018-04-07 DIAGNOSIS — J309 Allergic rhinitis, unspecified: Secondary | ICD-10-CM | POA: Diagnosis not present

## 2018-04-07 DIAGNOSIS — F419 Anxiety disorder, unspecified: Secondary | ICD-10-CM

## 2018-04-07 MED ORDER — SERTRALINE HCL 50 MG PO TABS: 50.0000 mg | ORAL_TABLET | Freq: Every day | ORAL | 3 refills | Status: DC

## 2018-04-07 MED ORDER — FLUTICASONE PROPIONATE 50 MCG/ACT NA SUSP: 2.0000 | Freq: Every day | NASAL | 6 refills | Status: DC

## 2018-04-07 NOTE — Patient Instructions (Signed)
Good to see you today  Please use your Afrin nasal spray twice a day for 3 days then follow with fluticasone nasal spray- twice a day for 3 days then nightly for at least several weeks.  If not better in a couple of weeks, let me know and I'll put in an ENT referral

## 2018-04-07 NOTE — Progress Notes (Signed)
Subjective:    Patient ID: Caroline [name], female    DOB: August 13, 1961, 57 y.o.   MRN: [id number]  HPI This is a 57 yo female who presents today with left sided nasal congestion, left sided ear "crusty," has itching of her nose with certain locations. Has been going on for several weeks. Little nasal drainage, some post nasal drainage. Has history of occasional sinusitis.  Takes Zyrtec daily x several months. Used to use flonase. Tried benadryl cream without relief.  Increased stress with her father being sick and her aunt with metastatic cervical cancer. She had stopped her sertraline 50 mg, but would like to go back on.  Was followed by gyn for pap, thinks last pap about 3 years ago, will make appointment with PCP for COE.   Past Medical History:  Diagnosis Date  . CAD (coronary artery disease)    has documented coronary vasospasm but with underlying 40% prox LAD, 30% prox LCX and 70 to 80% proximal 1st DX and 30% RCA; recurrent vasospasm with subsequent cath in Jan 2013.   Marland Kitchen Coronary vasospasm ([place])   . Esophagitis   . GERD (gastroesophageal reflux disease)   . Hyperlipidemia   . Hypertension   . IBS (irritable bowel syndrome)   . Palpitations    tachycardia on holter; treated with beta blockers   Past Surgical History:  Procedure Laterality Date  . APPENDECTOMY  1976  . BREAST EXCISIONAL BIOPSY Right    benign  . BREAST SURGERY  2002   biopsy  . CARDIAC CATHETERIZATION  2011  . CARDIAC CATHETERIZATION  Sept 2012   With documented vasospasm but with residual disease; managed medically  . CHOLECYSTECTOMY  1982  . LEFT HEART CATHETERIZATION WITH CORONARY ANGIOGRAM N/A 09/14/2011   Procedure: LEFT HEART CATHETERIZATION WITH CORONARY ANGIOGRAM;  Surgeon: Burnell Blanks, MD;  Location: [facility] CATH LAB;  Service: Cardiovascular;  Laterality: N/A;  . UPPER GASTROINTESTINAL ENDOSCOPY  01/11/2011   esophagitis   Family History  Problem Relation Age of Onset  . Breast cancer Mother  36  . Heart disease Mother        died CHF 60  . Diabetes Mother   . Prostate cancer Father   . Hyperlipidemia Father   . Heart disease Father        s/p cabg - alive  . Colon cancer Unknown        First cousin on dads side    Social History   Tobacco Use  . Smoking status: Former Smoker    Packs/day: 0.25    Years: 30.00    Pack years: 7.50    Types: Cigarettes    Last attempt to quit: 05/30/2014    Years since quitting: 3.8  . Smokeless tobacco: Never Used  Substance Use Topics  . Alcohol use: Yes    Alcohol/week: 2.0 standard drinks    Types: 2 Standard drinks or equivalent per week    Comment: occ  . Drug use: No      Review of Systems Per HPI    Objective:   Physical Exam  Constitutional: She is oriented to person, place, and time. She appears well-developed and well-nourished. No distress.  HENT:  Head: Normocephalic and atraumatic.  Right Ear: Tympanic membrane, external ear and ear canal normal.  Left Ear: External ear normal.  Nose: Mucosal edema and septal deviation present. Right sinus exhibits no maxillary sinus tenderness and no frontal sinus tenderness. Left sinus exhibits maxillary sinus tenderness. Left sinus exhibits no  frontal sinus tenderness.  Left TM dull. Canal mildly flaky.  Left nare with some distal narrowing.   Eyes: Conjunctivae are normal.  Neck: Normal range of motion. Neck supple.  Cardiovascular: Normal rate, regular rhythm and normal heart sounds.  Pulmonary/Chest: Effort normal and breath sounds normal.  Lymphadenopathy:    She has no cervical adenopathy.  Neurological: She is alert and oriented to person, place, and time.  Skin: Skin is warm and dry. She is not diaphoretic.  Psychiatric: She has a normal mood and affect. Her behavior is normal. Judgment and thought content normal.  Vitals reviewed.     BP 138/90 (BP Location: Right Arm, Patient Position: Sitting, Cuff Size: Normal)   Pulse 94   Temp 98 F (36.7 C) (Oral)    Ht 5\' 4"  (1.626 m)   Wt 184 lb (83.5 kg)   LMP 07/28/2010   SpO2 97%   BMI 31.58 kg/m  Wt Readings from Last 3 Encounters:  04/07/18 184 lb (83.5 kg)  11/21/17 187 lb 12.8 oz (85.2 kg)  09/26/17 187 lb (84.8 kg)       Assessment & Plan:  1. Anxiety - discussed coping mechanisms and encouraged her to continue reading, exercise, quiet time - sertraline (ZOLOFT) 50 MG tablet; Take 1 tablet (50 mg total) by mouth at bedtime.  Dispense: 90 tablet; Refill: 3  2. Allergic rhinitis, unspecified seasonality, unspecified trigger - afrin bid x 3 days followed by fluticasone, continue Zyrtec, change to another long acting antihistamine when out of current supply - fluticasone (FLONASE) 50 MCG/ACT nasal spray; Place 2 sprays into both nostrils daily.  Dispense: 16 g; Refill: 6 - if no improvement in 2 weeks, she will let me know and I'll put in ENT referral  Clarene Reamer, FNP-BC  [facility], [facility]  04/07/2018 3:29 PM

## 2018-04-28 DIAGNOSIS — L720 Epidermal cyst: Secondary | ICD-10-CM | POA: Diagnosis not present

## 2018-04-28 DIAGNOSIS — D3701 Neoplasm of uncertain behavior of lip: Secondary | ICD-10-CM | POA: Diagnosis not present

## 2018-04-28 DIAGNOSIS — D2262 Melanocytic nevi of left upper limb, including shoulder: Secondary | ICD-10-CM | POA: Diagnosis not present

## 2018-04-28 DIAGNOSIS — D2272 Melanocytic nevi of left lower limb, including hip: Secondary | ICD-10-CM | POA: Diagnosis not present

## 2018-04-28 DIAGNOSIS — L817 Pigmented purpuric dermatosis: Secondary | ICD-10-CM | POA: Diagnosis not present

## 2018-04-28 DIAGNOSIS — D485 Neoplasm of uncertain behavior of skin: Secondary | ICD-10-CM | POA: Diagnosis not present

## 2018-04-28 DIAGNOSIS — D2261 Melanocytic nevi of right upper limb, including shoulder: Secondary | ICD-10-CM | POA: Diagnosis not present

## 2018-04-28 DIAGNOSIS — D225 Melanocytic nevi of trunk: Secondary | ICD-10-CM | POA: Diagnosis not present

## 2018-05-10 ENCOUNTER — Other Ambulatory Visit: Payer: Self-pay | Admitting: Cardiovascular Disease

## 2018-05-10 DIAGNOSIS — I201 Angina pectoris with documented spasm: Secondary | ICD-10-CM

## 2018-05-18 ENCOUNTER — Other Ambulatory Visit: Payer: Self-pay | Admitting: Cardiovascular Disease

## 2018-05-18 ENCOUNTER — Telehealth: Payer: Self-pay | Admitting: Cardiovascular Disease

## 2018-05-18 DIAGNOSIS — I201 Angina pectoris with documented spasm: Secondary | ICD-10-CM

## 2018-05-18 NOTE — Telephone Encounter (Signed)
Please advise on refill request. Patients current med list has 360 mg, but her last office visit with Dr Caroline [name] has 300 mg listed. Looks like the 300 mg was removed and the 360 mg was put on her list at 04/07/18 office visit with Caroline Reamer, FNP, but it wasn't ordered. Patient also has an open phone note where she called the office today and requested that Dr Caroline [name] refill the 360 mg. Please advise. Thanks, MI

## 2018-05-18 NOTE — Telephone Encounter (Signed)
New Message:      *STAT* If patient is at the pharmacy, call can be transferred to refill team.   1. Which medications need to be refilled? (please list name of each medication and dose if known) diltiazem (TIAZAC) 360 MG 24 hr capsule  2. Which pharmacy/location (including street and city if local pharmacy) is medication to be sent to? [id number] - [place], [place] - [address]  3. Do they need a 30 day or 90 day supply? 30 Days

## 2018-05-18 NOTE — Telephone Encounter (Signed)
[facility] called [facility] and they have Cardizem 300 on pt's list and have sent refill request to our office for this. I placed call to pt to confirm dose she has been taking. Left message to call office.

## 2018-05-19 ENCOUNTER — Other Ambulatory Visit: Payer: Self-pay

## 2018-05-19 MED ORDER — DILTIAZEM HCL ER BEADS 300 MG PO CP24: 300.0000 mg | ORAL_CAPSULE | Freq: Every day | ORAL | 3 refills | Status: DC

## 2018-05-19 NOTE — Progress Notes (Signed)
Pt called to report that she needs a refill on her Diltiazem but our med list has her on 360mg  but the [facility] and [facility] have [facility]mg  and Dr. Camillia Herter note has her on 300mg  so I sent in her refill and altered her med list. Pt agreed and will call if she has any further problems.

## 2018-05-22 NOTE — Telephone Encounter (Signed)
Refill has already been sent to pharmacy

## 2018-05-23 ENCOUNTER — Other Ambulatory Visit: Payer: Self-pay | Admitting: Primary Care

## 2018-05-23 DIAGNOSIS — I10 Essential (primary) hypertension: Secondary | ICD-10-CM

## 2018-05-23 DIAGNOSIS — Z1159 Encounter for screening for other viral diseases: Secondary | ICD-10-CM

## 2018-05-23 DIAGNOSIS — R7303 Prediabetes: Secondary | ICD-10-CM

## 2018-06-02 ENCOUNTER — Other Ambulatory Visit (INDEPENDENT_AMBULATORY_CARE_PROVIDER_SITE_OTHER): Payer: BLUE CROSS/BLUE SHIELD

## 2018-06-02 DIAGNOSIS — R7303 Prediabetes: Secondary | ICD-10-CM

## 2018-06-02 DIAGNOSIS — I10 Essential (primary) hypertension: Secondary | ICD-10-CM | POA: Diagnosis not present

## 2018-06-02 DIAGNOSIS — Z1159 Encounter for screening for other viral diseases: Secondary | ICD-10-CM | POA: Diagnosis not present

## 2018-06-02 LAB — BASIC METABOLIC PANEL
BUN: 13 mg/dL (ref 6–23)
CHLORIDE: 103 meq/L (ref 96–112)
CO2: 28 mEq/L (ref 19–32)
CREATININE: 0.77 mg/dL (ref 0.40–1.20)
Calcium: 9.4 mg/dL (ref 8.4–10.5)
GFR: 81.6 mL/min (ref >60.00)
Glucose, Bld: 101 mg/dL — ABNORMAL HIGH (ref 70–99)
POTASSIUM: 4.5 meq/L (ref 3.5–5.1)
SODIUM: 141 meq/L (ref 135–145)

## 2018-06-02 LAB — HEMOGLOBIN A1C: HEMOGLOBIN A1C: 6 % (ref 4.6–6.5)

## 2018-06-03 LAB — HEPATITIS C ANTIBODY
HEP C AB: NONREACTIVE
SIGNAL TO CUT-OFF: 0.04 (ref <1.00)

## 2018-06-09 ENCOUNTER — Encounter: Payer: Self-pay | Admitting: Primary Care

## 2018-06-09 ENCOUNTER — Ambulatory Visit (INDEPENDENT_AMBULATORY_CARE_PROVIDER_SITE_OTHER): Payer: BLUE CROSS/BLUE SHIELD | Admitting: Primary Care

## 2018-06-09 ENCOUNTER — Other Ambulatory Visit (HOSPITAL_COMMUNITY)
Admission: RE | Admit: 2018-06-09 | Discharge: 2018-06-09 | Disposition: A | Discharge status: Home / Self Care | Payer: BLUE CROSS/BLUE SHIELD | Source: Ambulatory Visit | Attending: Primary Care | Admitting: Primary Care

## 2018-06-09 VITALS — BP 112/70 | HR 90 | Temp 98.0°F | Ht 64.0 in | Wt 177.0 lb

## 2018-06-09 DIAGNOSIS — Z124 Encounter for screening for malignant neoplasm of cervix: Secondary | ICD-10-CM

## 2018-06-09 DIAGNOSIS — J3089 Other allergic rhinitis: Secondary | ICD-10-CM

## 2018-06-09 DIAGNOSIS — I2510 Atherosclerotic heart disease of native coronary artery without angina pectoris: Secondary | ICD-10-CM

## 2018-06-09 DIAGNOSIS — I10 Essential (primary) hypertension: Secondary | ICD-10-CM

## 2018-06-09 DIAGNOSIS — R000 Tachycardia, unspecified: Secondary | ICD-10-CM

## 2018-06-09 DIAGNOSIS — J45909 Unspecified asthma, uncomplicated: Secondary | ICD-10-CM | POA: Diagnosis not present

## 2018-06-09 DIAGNOSIS — Z0000 Encounter for general adult medical examination without abnormal findings: Secondary | ICD-10-CM | POA: Diagnosis not present

## 2018-06-09 DIAGNOSIS — R7303 Prediabetes: Secondary | ICD-10-CM

## 2018-06-09 DIAGNOSIS — Z0001 Encounter for general adult medical examination with abnormal findings: Secondary | ICD-10-CM | POA: Insufficient documentation

## 2018-06-09 DIAGNOSIS — E785 Hyperlipidemia, unspecified: Secondary | ICD-10-CM

## 2018-06-09 MED ORDER — LEVOCETIRIZINE DIHYDROCHLORIDE 5 MG PO TABS: ORAL_TABLET | ORAL | 0 refills | Status: DC

## 2018-06-09 MED ORDER — ALBUTEROL SULFATE HFA 108 (90 BASE) MCG/ACT IN AERS: 2.0000 | INHALATION_SPRAY | Freq: Four times a day (QID) | RESPIRATORY_TRACT | 0 refills | Status: DC | PRN

## 2018-06-09 MED ORDER — PREDNISONE 20 MG PO TABS: ORAL_TABLET | ORAL | 0 refills | Status: DC

## 2018-06-09 NOTE — Assessment & Plan Note (Signed)
Td UTD, influenza vaccination deferred. Pap smear due, completed today. Mammogram UTD. Recommended to work on diet, get regular exercise. Exam unremarkable. Labs reviewed. Follow up in 1 year for CPE.

## 2018-06-09 NOTE — Assessment & Plan Note (Signed)
Chronic, worse during Fall seasonal changes. Continue Flonase. Start Xyzal nightly. Rx for prednisone course sent to pharmacy for sinus pressure and congestion. She will update in a few weeks.

## 2018-06-09 NOTE — Assessment & Plan Note (Signed)
Lipids from June 2019 stable. Continue atorvastatin.

## 2018-06-09 NOTE — Assessment & Plan Note (Signed)
Stable in the office today, continue diltiazem as prescribed.

## 2018-06-09 NOTE — Progress Notes (Signed)
Subjective:    Patient ID: Caroline [name], female    DOB: November 12, 1960, 57 y.o.   MRN: [id number]  HPI  Caroline [name] is a 57 year old female who presents today for complete physical.  She is requesting an albuterol inhaler to use PRN for her allergies. She has two long haired [place] dogs who are shedding more frequently. Typically experiences allergy symptoms during fall seasonal changes.   She's also reporting chronic nasal congestion, left maxillary sinus pressure, left ear fullness. She was recently on a five day course of Cephalexin for a slow healing wound without improvement to sinus symptoms. She's using her Flonase 1-2 times daily, also used Afrin several times. She does not take an antihistamine. Symptoms have been present since late July 2019.   Immunizations: -Tetanus: Completed in 2012 -Influenza: Due, declines today due to cold   Diet: She endorses a fair diet Breakfast: Cereal Lunch: Meat, vegetable, starch Dinner: Meat, vegetable, starch Snacks: None Desserts: None Beverages: Water, juice   Exercise: She is not exercising, she is active Eye exam: Completed years ago Dental exam: Completes semi-annually  Colonoscopy: Completed in 2016, due in 2021 Pap Smear: Due today Mammogram: Completed in January 2019 Hep C Screen: Negative in October 2019  BP Readings from Last 3 Encounters:  06/09/18 112/70  04/07/18 138/90  11/21/17 126/70     Review of Systems  Constitutional: Negative for fever and unexpected weight change.  HENT: Positive for congestion, postnasal drip, rhinorrhea and sinus pressure.   Respiratory: Positive for cough. Negative for shortness of breath.   Cardiovascular: Negative for chest pain.  Gastrointestinal: Negative for constipation and diarrhea.  Genitourinary: Negative for difficulty urinating.  Musculoskeletal: Negative for arthralgias and myalgias.  Skin: Negative for rash.       Slow healing ulcer to right anterior lower  extremity since nevus removal at dermatology office.  Allergic/Immunologic: Positive for environmental allergies.  Neurological: Negative for dizziness, numbness and headaches.  Psychiatric/Behavioral:       Feels well managed on Zoloft, denies SI/HI.       Past Medical History:  Diagnosis Date  . CAD (coronary artery disease)    has documented coronary vasospasm but with underlying 40% prox LAD, 30% prox LCX and 70 to 80% proximal 1st DX and 30% RCA; recurrent vasospasm with subsequent cath in Jan 2013.   Marland Kitchen Coronary vasospasm ([place])   . Environmental and seasonal allergies   . Esophagitis   . GERD (gastroesophageal reflux disease)   . Hyperlipidemia   . Hypertension   . IBS (irritable bowel syndrome)   . Palpitations    tachycardia on holter; treated with beta blockers     Social History   Socioeconomic History  . Marital status: Married    Spouse name: Not on file  . Number of children: 2  . Years of education: Not on file  . Highest education level: Not on file  Occupational History  . Occupation: Unemployed  Social Needs  . Financial resource strain: Not on file  . Food insecurity:    Worry: Not on file    Inability: Not on file  . Transportation needs:    Medical: Not on file    Non-medical: Not on file  Tobacco Use  . Smoking status: Former Smoker    Packs/day: 0.25    Years: 30.00    Pack years: 7.50    Types: Cigarettes    Last attempt to quit: 05/30/2014    Years since quitting: 4.0  .  Smokeless tobacco: Never Used  Substance and Sexual Activity  . Alcohol use: Yes    Alcohol/week: 2.0 standard drinks    Types: 2 Standard drinks or equivalent per week    Comment: occ  . Drug use: No  . Sexual activity: Yes  Lifestyle  . Physical activity:    Days per week: Not on file    Minutes per session: Not on file  . Stress: Not on file  Relationships  . Social connections:    Talks on phone: Not on file    Gets together: Not on file    Attends religious  service: Not on file    Active member of club or organization: Not on file    Attends meetings of clubs or organizations: Not on file    Relationship status: Not on file  . Intimate partner violence:    Fear of current or ex partner: Not on file    Emotionally abused: Not on file    Physically abused: Not on file    Forced sexual activity: Not on file  Other Topics Concern  . Not on file  Social History Narrative   Married.   2 children.   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   She enjoys being with her dogs, shopping.     Past Surgical History:  Procedure Laterality Date  . APPENDECTOMY  1976  . BREAST EXCISIONAL BIOPSY Right    benign  . BREAST SURGERY  2002   biopsy  . CARDIAC CATHETERIZATION  2011  . CARDIAC CATHETERIZATION  Sept 2012   With documented vasospasm but with residual disease; managed medically  . CHOLECYSTECTOMY  1982  . LEFT HEART CATHETERIZATION WITH CORONARY ANGIOGRAM N/A 09/14/2011   Procedure: LEFT HEART CATHETERIZATION WITH CORONARY ANGIOGRAM;  Surgeon: Burnell Blanks, MD;  Location: [facility] CATH LAB;  Service: Cardiovascular;  Laterality: N/A;  . UPPER GASTROINTESTINAL ENDOSCOPY  01/11/2011   esophagitis    Family History  Problem Relation Age of Onset  . Breast cancer Mother 43  . Heart disease Mother        died CHF 34  . Diabetes Mother   . Prostate cancer Father   . Hyperlipidemia Father   . Heart disease Father        s/p cabg - alive  . Colon cancer Unknown        First cousin on dads side     No Known Allergies  Current Outpatient Medications on File Prior to Visit  Medication Sig Dispense Refill  . acetaminophen (TYLENOL) 500 MG tablet Take 500 mg by mouth every 6 (six) hours as needed. For pain    . aspirin 81 MG tablet Take 81 mg by mouth daily.      Marland Kitchen atorvastatin (LIPITOR) 20 MG tablet Take 1 tablet (20 mg total) by mouth daily. 90 tablet 3  . Cholecalciferol (VITAMIN D) 2000 units CAPS Take 2000 units once per day. 30 capsule    . diltiazem (TIAZAC) 300 MG 24 hr capsule Take 1 capsule (300 mg total) by mouth daily. 90 capsule 3  . fluticasone (FLONASE) 50 MCG/ACT nasal spray Place 2 sprays into both nostrils daily. 16 g 6  . isosorbide dinitrate (ISORDIL) 30 MG tablet TAKE 1 TABLET (30 MG TOTAL) BY MOUTH 3 (THREE) TIMES DAILY. 270 tablet 2  . nitroGLYCERIN (NITROSTAT) 0.4 MG SL tablet Place 1 tablet (0.4 mg total) under the tongue every 5 (five) minutes as needed for chest pain. X 3 doses 75 tablet  1  . sertraline (ZOLOFT) 50 MG tablet Take 1 tablet (50 mg total) by mouth at bedtime. 90 tablet 3   No current facility-administered medications on file prior to visit.     BP 112/70   Pulse 90   Temp 98 F (36.7 C) (Oral)   Ht 5\' 4"  (1.626 m)   Wt 177 lb (80.3 kg)   LMP 07/28/2010   SpO2 95%   BMI 30.38 kg/m    Objective:   Physical Exam  Constitutional: She is oriented to person, place, and time. She appears well-nourished.  HENT:  Nose: Mucosal edema present.  Mouth/Throat: No oropharyngeal exudate.  Right TM bulging without erythema or effusion  Eyes: Pupils are equal, round, and reactive to light. EOM are normal.  Neck: Neck supple. No thyromegaly present.  Cardiovascular: Normal rate and regular rhythm.  Respiratory: Effort normal and breath sounds normal.  GI: Soft. Bowel sounds are normal. There is no tenderness.  Genitourinary: There is no tenderness or lesion on the right labia. There is no tenderness or lesion on the left labia. Cervix exhibits no motion tenderness and no discharge. Right adnexum displays no tenderness. Left adnexum displays no tenderness. No erythema in the vagina. No vaginal discharge found.  Musculoskeletal: Normal range of motion.  Neurological: She is alert and oriented to person, place, and time.  Skin: Skin is warm and dry.     0.5 oval open ulcer to right lower extremity. Mild surrounding erythema without signs of infection. No drainage.  Psychiatric: She has a normal  mood and affect.           Assessment & Plan:

## 2018-06-09 NOTE — Assessment & Plan Note (Signed)
Following with cardiology. Continue aspirin, statin, BP control.

## 2018-06-09 NOTE — Patient Instructions (Signed)
Start prednisone tablets. Take 2 tablets for 4 days, then 1 tablet for 4 days.  Start levocetirizine (Xyzal) tablets for allergies. Take 1 tablet at bedtime.  Shortness of Breath/Wheezing/Cough: Use the albuterol inhaler. Inhale 2 puffs into the lungs every 4 to 6 hours as needed for wheezing, cough, and/or shortness of breath.   Start exercising. You should be getting 150 minutes of moderate intensity exercise weekly.  Continue to work on [employer] as discussed.  Schedule a lab only appointment in 6 months to recheck your blood sugar levels.  We will see you in 1 year for your annual exam or sooner if needed.  It was a pleasure to see you today!   Preventive Care 40-64 Years, Female Preventive care refers to lifestyle choices and visits with your health care provider that can promote health and wellness. What does preventive care include?  A yearly physical exam. This is also called an annual well check.  Dental exams once or twice a year.  Routine eye exams. Ask your health care provider how often you should have your eyes checked.  Personal lifestyle choices, including: ? Daily care of your teeth and gums. ? Regular physical activity. ? Eating a healthy diet. ? Avoiding tobacco and drug use. ? Limiting alcohol use. ? Practicing safe sex. ? Taking low-dose aspirin daily starting at age 43. ? Taking vitamin and mineral supplements as recommended by your health care provider. What happens during an annual well check? The services and screenings done by your health care provider during your annual well check will depend on your age, overall health, lifestyle risk factors, and family history of disease. Counseling Your health care provider may ask you questions about your:  Alcohol use.  Tobacco use.  Drug use.  Emotional well-being.  Home and relationship well-being.  Sexual activity.  Eating habits.  Work and work Statistician.  Method of birth  control.  Menstrual cycle.  Pregnancy history.  Screening You may have the following tests or measurements:  Height, weight, and BMI.  Blood pressure.  Lipid and cholesterol levels. These may be checked every 5 years, or more frequently if you are over 97 years old.  Skin check.  Lung cancer screening. You may have this screening every year starting at age 65 if you have a 30-pack-year history of smoking and currently smoke or have quit within the past 15 years.  Fecal occult blood test (FOBT) of the stool. You may have this test every year starting at age 37.  Flexible sigmoidoscopy or colonoscopy. You may have a sigmoidoscopy every 5 years or a colonoscopy every 10 years starting at age 12.  Hepatitis C blood test.  Hepatitis B blood test.  Sexually transmitted disease (STD) testing.  Diabetes screening. This is done by checking your blood sugar (glucose) after you have not eaten for a while (fasting). You may have this done every 1-3 years.  Mammogram. This may be done every 1-2 years. Talk to your health care provider about when you should start having regular mammograms. This may depend on whether you have a family history of breast cancer.  BRCA-related cancer screening. This may be done if you have a family history of breast, ovarian, tubal, or peritoneal cancers.  Pelvic exam and Pap test. This may be done every 3 years starting at age 28. Starting at age 62, this may be done every 5 years if you have a Pap test in combination with an HPV test.  Bone density scan.  This is done to screen for osteoporosis. You may have this scan if you are at high risk for osteoporosis.  Discuss your test results, treatment options, and if necessary, the need for more tests with your health care provider. Vaccines Your health care provider may recommend certain vaccines, such as:  Influenza vaccine. This is recommended every year.  Tetanus, diphtheria, and acellular pertussis (Tdap,  Td) vaccine. You may need a Td booster every 10 years.  Varicella vaccine. You may need this if you have not been vaccinated.  Zoster vaccine. You may need this after age 34.  Measles, mumps, and rubella (MMR) vaccine. You may need at least one dose of MMR if you were born in 1957 or later. You may also need a second dose.  Pneumococcal 13-valent conjugate (PCV13) vaccine. You may need this if you have certain conditions and were not previously vaccinated.  Pneumococcal polysaccharide (PPSV23) vaccine. You may need one or two doses if you smoke cigarettes or if you have certain conditions.  Meningococcal vaccine. You may need this if you have certain conditions.  Hepatitis A vaccine. You may need this if you have certain conditions or if you travel or work in places where you may be exposed to hepatitis A.  Hepatitis B vaccine. You may need this if you have certain conditions or if you travel or work in places where you may be exposed to hepatitis B.  Haemophilus influenzae type b (Hib) vaccine. You may need this if you have certain conditions.  Talk to your health care provider about which screenings and vaccines you need and how often you need them. This information is not intended to replace advice given to you by your health care provider. Make sure you discuss any questions you have with your health care provider. Document Released: 09/12/2015 Document Revised: 05/05/2016 Document Reviewed: 06/17/2015 Elsevier Interactive Patient Education  [employer].

## 2018-06-09 NOTE — Assessment & Plan Note (Signed)
Recent A1C of 6.0 which is improved from 6.2 several months ago. Recommended she work on her diet, start exercising. Continue to monitor.

## 2018-06-09 NOTE — Assessment & Plan Note (Signed)
Rate and rhythm regular today. Continue diltiazem.  

## 2018-06-09 NOTE — Assessment & Plan Note (Signed)
Intermittent symptoms, mostly during Fall seasonal changes. Rx for albuterol sent to pharmacy. Discussed to notify if she continues to require this more than three times weekly after seasonal changes.

## 2018-06-14 LAB — CYTOLOGY - PAP
DIAGNOSIS: NEGATIVE
HPV: NOT DETECTED

## 2018-06-16 ENCOUNTER — Encounter: Payer: Self-pay | Admitting: *Deleted

## 2018-07-02 ENCOUNTER — Other Ambulatory Visit: Payer: Self-pay | Admitting: Primary Care

## 2018-07-02 DIAGNOSIS — J45909 Unspecified asthma, uncomplicated: Secondary | ICD-10-CM

## 2018-08-19 ENCOUNTER — Telehealth: Payer: BLUE CROSS/BLUE SHIELD | Admitting: Physician Assistant

## 2018-08-19 DIAGNOSIS — J019 Acute sinusitis, unspecified: Secondary | ICD-10-CM

## 2018-08-19 DIAGNOSIS — B9689 Other specified bacterial agents as the cause of diseases classified elsewhere: Secondary | ICD-10-CM

## 2018-08-19 MED ORDER — AMOXICILLIN-POT CLAVULANATE 875-125 MG PO TABS
1.0000 | ORAL_TABLET | Freq: Two times a day (BID) | ORAL | 0 refills | Status: DC
Start: 2018-08-19 — End: 2018-09-04

## 2018-08-19 NOTE — Progress Notes (Signed)

## 2018-09-04 ENCOUNTER — Encounter: Payer: Self-pay | Admitting: Family Medicine

## 2018-09-04 ENCOUNTER — Ambulatory Visit: Payer: BLUE CROSS/BLUE SHIELD | Admitting: Family Medicine

## 2018-09-04 ENCOUNTER — Other Ambulatory Visit: Payer: Self-pay | Admitting: Primary Care

## 2018-09-04 VITALS — BP 118/66 | HR 94 | Temp 98.8°F | Ht 64.0 in | Wt 178.8 lb

## 2018-09-04 DIAGNOSIS — R52 Pain, unspecified: Secondary | ICD-10-CM | POA: Diagnosis not present

## 2018-09-04 DIAGNOSIS — J069 Acute upper respiratory infection, unspecified: Secondary | ICD-10-CM

## 2018-09-04 DIAGNOSIS — B9789 Other viral agents as the cause of diseases classified elsewhere: Secondary | ICD-10-CM | POA: Diagnosis not present

## 2018-09-04 DIAGNOSIS — J3089 Other allergic rhinitis: Secondary | ICD-10-CM

## 2018-09-04 LAB — POC INFLUENZA A&B (BINAX/QUICKVUE)
Influenza A, POC: NEGATIVE
Influenza B, POC: NEGATIVE

## 2018-09-04 NOTE — Patient Instructions (Signed)
Good to see you today, I hope you feel better soon  Do not take Afrin for more than 4 days in a row, then take a break for at least a week  Continue nightly Flonase and can add saline nasal spray 5-7 times a day  Can use a little ibuprofen (2-3 tablets every 8 to 12 hours as needed) alternating with Tylenol for fever and pain.   Can take Delsym over the counter for cough  If you are not better in 10-14 days or if you develop worsening symptoms, shortness of breath or vomiting, please follow up

## 2018-09-04 NOTE — Progress Notes (Signed)
Subjective:    Patient ID: Caroline [name], female    DOB: 12/14/1960, 58 y.o.   MRN: [id number]  HPI This is a 58 yo female who presents today with cough, body aches, fever/chills, headache, stiff and sore neck x 4 days. Cough is dry. Throat irritation, mild intermittent headache, no ear pain, no SOB, no wheeze. Subjective fever, fatigue. Family members with similar symptoms.  Has been taking acetaminophen, afrin, flonase with little relief. Was told by cardiologist to avoid NSAIDs.   Past Medical History:  Diagnosis Date  . CAD (coronary artery disease)    has documented coronary vasospasm but with underlying 40% prox LAD, 30% prox LCX and 70 to 80% proximal 1st DX and 30% RCA; recurrent vasospasm with subsequent cath in Jan 2013.   Marland Kitchen Coronary vasospasm ([place])   . Environmental and seasonal allergies   . Esophagitis   . GERD (gastroesophageal reflux disease)   . Hyperlipidemia   . Hypertension   . IBS (irritable bowel syndrome)   . Palpitations    tachycardia on holter; treated with beta blockers   Past Surgical History:  Procedure Laterality Date  . APPENDECTOMY  1976  . BREAST EXCISIONAL BIOPSY Right    benign  . BREAST SURGERY  2002   biopsy  . CARDIAC CATHETERIZATION  2011  . CARDIAC CATHETERIZATION  Sept 2012   With documented vasospasm but with residual disease; managed medically  . CHOLECYSTECTOMY  1982  . LEFT HEART CATHETERIZATION WITH CORONARY ANGIOGRAM N/A 09/14/2011   Procedure: LEFT HEART CATHETERIZATION WITH CORONARY ANGIOGRAM;  Surgeon: Burnell Blanks, MD;  Location: [facility] CATH LAB;  Service: Cardiovascular;  Laterality: N/A;  . UPPER GASTROINTESTINAL ENDOSCOPY  01/11/2011   esophagitis   Family History  Problem Relation Age of Onset  . Breast cancer Mother 44  . Heart disease Mother        died CHF 38  . Diabetes Mother   . Prostate cancer Father   . Hyperlipidemia Father   . Heart disease Father        s/p cabg - alive  . Colon cancer Unknown      First cousin on dads side    Social History   Tobacco Use  . Smoking status: Former Smoker    Packs/day: 0.25    Years: 30.00    Pack years: 7.50    Types: Cigarettes    Last attempt to quit: 05/30/2014    Years since quitting: 4.2  . Smokeless tobacco: Never Used  Substance Use Topics  . Alcohol use: Yes    Alcohol/week: 2.0 standard drinks    Types: 2 Standard drinks or equivalent per week    Comment: occ  . Drug use: No      Review of Systems Per HPI    Objective:   Physical Exam Vitals signs reviewed.  Constitutional:      General: She is not in acute distress.    Appearance: She is obese. She is ill-appearing. She is not toxic-appearing or diaphoretic.  HENT:     Head: Normocephalic and atraumatic.     Right Ear: Tympanic membrane, ear canal and external ear normal.     Left Ear: Tympanic membrane, ear canal and external ear normal.     Nose: Congestion present.     Mouth/Throat:     Mouth: Mucous membranes are moist.     Pharynx: Posterior oropharyngeal erythema (mild) present. No oropharyngeal exudate.  Eyes:     Conjunctiva/sclera: Conjunctivae normal.  Neck:  Musculoskeletal: Normal range of motion and neck supple. Muscular tenderness (trapezius muscles) present. No neck rigidity.  Cardiovascular:     Rate and Rhythm: Normal rate and regular rhythm.     Heart sounds: Normal heart sounds.  Pulmonary:     Effort: Pulmonary effort is normal.     Breath sounds: Normal breath sounds.  Lymphadenopathy:     Cervical: No cervical adenopathy.  Skin:    General: Skin is warm and dry.  Neurological:     Mental Status: She is alert and oriented to person, place, and time.  Psychiatric:        Mood and Affect: Mood normal.        Behavior: Behavior normal.        Thought Content: Thought content normal.        Judgment: Judgment normal.       BP 118/66   Pulse 94   Temp 98.8 F (37.1 C) (Oral)   Ht 5\' 4"  (1.626 m)   Wt 178 lb 12 oz (81.1 kg)    LMP 07/28/2010   SpO2 96%   BMI 30.68 kg/m  Wt Readings from Last 3 Encounters:  09/04/18 178 lb 12 oz (81.1 kg)  06/09/18 177 lb (80.3 kg)  04/07/18 184 lb (83.5 kg)   Results for orders placed or performed in visit on 09/04/18  POC Influenza A&B(BINAX/QUICKVUE)  Result Value Ref Range   Influenza A, POC Negative Negative   Influenza B, POC Negative Negative       Assessment & Plan:  1. Body aches - Influenza POCT- negative - POC Influenza A&B(BINAX/QUICKVUE)  2. Viral URI with cough - Provided written and verbal information regarding diagnosis and treatment. - Normal kidney function, can use otc NSAIDs sparingly for symptom relief -  Patient Instructions  Good to see you today, I hope you feel better soon  Do not take Afrin for more than 4 days in a row, then take a break for at least a week  Continue nightly Flonase and can add saline nasal spray 5-7 times a day  Can use a little ibuprofen (2-3 tablets every 8 to 12 hours as needed) alternating with Tylenol for fever and pain.   Can take Delsym over the counter for cough  If you are not better in 10-14 days or if you develop worsening symptoms, shortness of breath or vomiting, please follow up  Clarene Reamer, FNP-BC  [facility], [place]  09/05/2018 6:39 AM

## 2018-09-05 ENCOUNTER — Encounter: Payer: Self-pay | Admitting: Family Medicine

## 2018-11-10 ENCOUNTER — Other Ambulatory Visit: Payer: Self-pay | Admitting: Family Medicine

## 2018-11-10 DIAGNOSIS — Z1231 Encounter for screening mammogram for malignant neoplasm of breast: Secondary | ICD-10-CM

## 2018-11-15 ENCOUNTER — Telehealth: Payer: Self-pay | Admitting: *Deleted

## 2018-11-15 MED ORDER — ISOSORBIDE DINITRATE 30 MG PO TABS: ORAL_TABLET | ORAL | 2 refills | Status: DC

## 2018-11-15 NOTE — Telephone Encounter (Signed)
Pt is scheduled to see Dr. Angelena Form on March 26,2020. I placed call to pt to discuss postponing this appointment if she was feeling OK. Left message to call office.

## 2018-11-15 NOTE — Telephone Encounter (Signed)
I spoke with pt who reports she is feeling well. She is agreeable to postponing March  26 appointment. She does need refill of Isosorbide sent in. Will send to [facility] in [place]. Pt aware we will call her to reschedule appointment.

## 2018-11-23 ENCOUNTER — Ambulatory Visit: Payer: BLUE CROSS/BLUE SHIELD | Admitting: Cardiovascular Disease

## 2018-12-04 ENCOUNTER — Telehealth: Payer: Self-pay

## 2018-12-04 NOTE — Telephone Encounter (Signed)
Pt is willing to do evisit. She will be available all day 12/05/2018 for the nurse to call and set up visit. Pt is unable to get her bp at home but verbalized understanding on how to download the app.         Virtual Visit Pre-Appointment Phone Call  Steps For Call:  1. Confirm consent - "In the setting of the current Covid19 crisis, you are scheduled for a (phone or video) visit with your provider on (date) at (time).  Just as we do with many in-office visits, in order for you to participate in this visit, we must obtain consent.  If you'd like, I can send this to your mychart (if signed up) or email for you to review.  Otherwise, I can obtain your verbal consent now.  All virtual visits are billed to your insurance company just like a normal visit would be.  By agreeing to a virtual visit, we'd like you to understand that the technology does not allow for your provider to perform an examination, and thus may limit your provider's ability to fully assess your condition.  Finally, though the technology is pretty good, we cannot assure that it will always work on either your or our end, and in the setting of a video visit, we may have to convert it to a phone-only visit.  In either situation, we cannot ensure that we have a secure connection.  Are you willing to proceed?"  2. Give patient instructions for WebEx download to smartphone as below if video visit  3. Advise patient to be prepared with any vital sign or heart rhythm information, their current medicines, and a piece of paper and pen handy for any instructions they may receive the day of their visit  4. Inform patient they will receive a phone call 15 minutes prior to their appointment time (may be from unknown caller ID) so they should be prepared to answer  5. Confirm that appointment type is correct in Epic appointment notes (video vs telephone)    TELEPHONE CALL NOTE  Caroline [name] has been deemed a candidate for a follow-up  tele-health visit to limit community exposure during the Covid-19 pandemic. I spoke with the patient via phone to ensure availability of phone/video source, confirm preferred email & phone number, and discuss instructions and expectations.  I reminded Caroline [name] to be prepared with any vital sign and/or heart rhythm information that could potentially be obtained via home monitoring, at the time of her visit. I reminded Caroline [name] to expect a phone call at the time of her visit if her visit.  Did the patient verbally acknowledge consent to treatment? yes  Gar Ponto, [facility] 12/04/2018 3:43 PM   DOWNLOADING THE [place], go to [employer] and type in WebEx in the search bar. [place] [employer], the blue/green circle. The app is free but as with any other app downloads, their phone may require them to verify saved payment information or Apple password. The patient does NOT have to create an account.  - If Android, ask patient to go to [employer] and type in WebEx in the search bar. [place] [employer], the blue/green circle. The app is free but as with any other app downloads, their phone may require them to verify saved payment information or Android password. The patient does NOT have to create an account.   CONSENT FOR TELE-HEALTH VISIT - PLEASE REVIEW  I  hereby voluntarily request, consent and authorize [facility] and its employed or contracted physicians, physician assistants, nurse practitioners or other licensed health care professionals (the Practitioner), to provide me with telemedicine health care services (the "Services") as deemed necessary by the treating Practitioner. I acknowledge and consent to receive the Services by the Practitioner via telemedicine. I understand that the telemedicine visit will involve communicating with the Practitioner through live audiovisual communication technology and the disclosure of  certain medical information by electronic transmission. I acknowledge that I have been given the opportunity to request an in-person assessment or other available alternative prior to the telemedicine visit and am voluntarily participating in the telemedicine visit.  I understand that I have the right to withhold or withdraw my consent to the use of telemedicine in the course of my care at any time, without affecting my right to future care or treatment, and that the Practitioner or I may terminate the telemedicine visit at any time. I understand that I have the right to inspect all information obtained and/or recorded in the course of the telemedicine visit and may receive copies of available information for a reasonable fee.  I understand that some of the potential risks of receiving the Services via telemedicine include:  Marland Kitchen Delay or interruption in medical evaluation due to technological equipment failure or disruption; . Information transmitted may not be sufficient (e.g. poor resolution of images) to allow for appropriate medical decision making by the Practitioner; and/or  . In rare instances, security protocols could fail, causing a breach of personal health information.  Furthermore, I acknowledge that it is my responsibility to provide information about my medical history, conditions and care that is complete and accurate to the best of my ability. I acknowledge that Practitioner's advice, recommendations, and/or decision may be based on factors not within their control, such as incomplete or inaccurate data provided by me or distortions of diagnostic images or specimens that may result from electronic transmissions. I understand that the practice of medicine is not an exact science and that Practitioner makes no warranties or guarantees regarding treatment outcomes. I acknowledge that I will receive a copy of this consent concurrently upon execution via email to the email address I last provided but  may also request a printed copy by calling the office of [facility].    I understand that my insurance will be billed for this visit.   I have read or had this consent read to me. . I understand the contents of this consent, which adequately explains the benefits and risks of the Services being provided via telemedicine.  . I have been provided ample opportunity to ask questions regarding this consent and the Services and have had my questions answered to my satisfaction. . I give my informed consent for the services to be provided through the use of telemedicine in my medical care  By participating in this telemedicine visit I agree to the above.

## 2018-12-04 NOTE — Telephone Encounter (Signed)
Happy to do evisit doximity or webex yearly with this patient

## 2018-12-05 NOTE — Telephone Encounter (Signed)
Left message to call back  

## 2018-12-05 NOTE — Telephone Encounter (Signed)
Patient scheduled with Ermalinda Barrios, PA for VIDEO Visit on 4/8

## 2018-12-06 ENCOUNTER — Encounter: Payer: Self-pay | Admitting: Physician Assistant

## 2018-12-06 ENCOUNTER — Other Ambulatory Visit: Payer: Self-pay

## 2018-12-06 ENCOUNTER — Telehealth (INDEPENDENT_AMBULATORY_CARE_PROVIDER_SITE_OTHER): Payer: BLUE CROSS/BLUE SHIELD | Admitting: Physician Assistant

## 2018-12-06 ENCOUNTER — Ambulatory Visit: Payer: BLUE CROSS/BLUE SHIELD

## 2018-12-06 VITALS — BP 112/70 | HR 90 | Ht 64.0 in | Wt 177.0 lb

## 2018-12-06 DIAGNOSIS — E785 Hyperlipidemia, unspecified: Secondary | ICD-10-CM | POA: Diagnosis not present

## 2018-12-06 DIAGNOSIS — R7303 Prediabetes: Secondary | ICD-10-CM

## 2018-12-06 DIAGNOSIS — I10 Essential (primary) hypertension: Secondary | ICD-10-CM | POA: Diagnosis not present

## 2018-12-06 DIAGNOSIS — I201 Angina pectoris with documented spasm: Secondary | ICD-10-CM

## 2018-12-06 DIAGNOSIS — I2510 Atherosclerotic heart disease of native coronary artery without angina pectoris: Secondary | ICD-10-CM | POA: Diagnosis not present

## 2018-12-06 DIAGNOSIS — R5383 Other fatigue: Secondary | ICD-10-CM

## 2018-12-06 MED ORDER — ATORVASTATIN CALCIUM 20 MG PO TABS: 20.0000 mg | ORAL_TABLET | Freq: Every day | ORAL | 3 refills | Status: DC

## 2018-12-06 NOTE — Patient Instructions (Signed)
Medication Instructions:  Your physician recommends that you continue on your current medications as directed. Please refer to the Current Medication list given to you today.  If you need a refill on your cardiac medications before your next appointment, please call your pharmacy.   Lab work: Your physician recommends that you return for a FASTING labs (TSH, CBC, CMET, LIPIDS) in June   If you have labs (blood work) drawn today and your tests are completely normal, you will receive your results only by: Marland Kitchen MyChart Message (if you have MyChart) OR . A paper copy in the mail If you have any lab test that is abnormal or we need to change your treatment, we will call you to review the results.  Testing/Procedures: None ordered  Follow-Up: At [facility], you and your health needs are our priority.  As part of our continuing mission to provide you with exceptional heart care, we have created designated Provider Care Teams.  These Care Teams include your primary Cardiologist (physician) and Advanced Practice Providers (APPs -  Physician Assistants and Nurse Practitioners) who all work together to provide you with the care you need, when you need it. . You will need a follow up appointment in 1 year.  Please call our office 2 months in advance to schedule this appointment.  You may see Darlina Guys, MD or one of the following Advanced Practice Providers on your designated Care Team:   . Lyda Jester, PA-C . Dayna Dunn, PA-C . Ermalinda Barrios, PA-C  Any Other Special Instructions Will Be Listed Below (If Applicable).  Your provider recommends that you maintain 150 minutes per week of moderate aerobic activity.

## 2018-12-06 NOTE — Progress Notes (Signed)
Virtual Visit via Video Note   This visit type was conducted due to national recommendations for restrictions regarding the COVID-19 Pandemic (e.g. social distancing) in an effort to limit this patient's exposure and mitigate transmission in our community.  Due to her co-morbid illnesses, this patient is at least at moderate risk for complications without adequate follow up.  This format is felt to be most appropriate for this patient at this time.  All issues noted in this document were discussed and addressed.  A limited physical exam was performed with this format.  Please refer to the patient's chart for her consent to telehealth for [facility].   Evaluation Performed:  Follow-up visit  Date:  12/06/2018   ID:  Caroline [name], DOB 26-Jun-1961, MRN [id number]  Patient Location: Home  Provider Location: Home  PCP:  Pleas Koch, NP  Cardiologist:  Lauree Chandler, MD  Electrophysiologist:  None   Chief Complaint: Follow-up  History of Present Illness:    Caroline [name] is a 58 y.o. female who presents via audio/video conferencing for a telehealth visit today.    This is a 58 year old patient with CAD, Prinzmetal angina.  Last cath in 2013 noted to have spasm in the ostial LAD with mild calcification, ostial diagonal 50% stenosis, 20% mid circumflex and 30% OM, 30% mid RCA, LVEF 55%.  NST 09/2016 no ischemia.  Patient also has hyperlipidemia, IBS and GERD.  Patient last saw Dr. Angelena Form 11/21/2017 and was doing well without chest pain.  He stopped Ranexa due to cost.  He increased her Lipitor to 20 mg daily because of an LDL of 86.  He also came down to 52 on recheck 02/24/2018.  Denies chest pain, dyspnea on exertion, dyspnea, dizziness or presyncope. Stays tired a lot. Feels like she needs a special vitamins like zinc. No regular exercise other than housework.  The patient does not have symptoms concerning for COVID-19 infection (fever, chills, cough, or new shortness of  breath).    Past Medical History:  Diagnosis Date  . CAD (coronary artery disease)    has documented coronary vasospasm but with underlying 40% prox LAD, 30% prox LCX and 70 to 80% proximal 1st DX and 30% RCA; recurrent vasospasm with subsequent cath in Jan 2013.   Marland Kitchen Coronary vasospasm ([place])   . Environmental and seasonal allergies   . Esophagitis   . GERD (gastroesophageal reflux disease)   . Hyperlipidemia   . Hypertension   . IBS (irritable bowel syndrome)   . Palpitations    tachycardia on holter; treated with beta blockers   Past Surgical History:  Procedure Laterality Date  . APPENDECTOMY  1976  . BREAST EXCISIONAL BIOPSY Right    benign  . BREAST SURGERY  2002   biopsy  . CARDIAC CATHETERIZATION  2011  . CARDIAC CATHETERIZATION  Sept 2012   With documented vasospasm but with residual disease; managed medically  . CHOLECYSTECTOMY  1982  . LEFT HEART CATHETERIZATION WITH CORONARY ANGIOGRAM N/A 09/14/2011   Procedure: LEFT HEART CATHETERIZATION WITH CORONARY ANGIOGRAM;  Surgeon: Burnell Blanks, MD;  Location: [facility] CATH LAB;  Service: Cardiovascular;  Laterality: N/A;  . UPPER GASTROINTESTINAL ENDOSCOPY  01/11/2011   esophagitis     Current Meds  Medication Sig  . acetaminophen (TYLENOL) 500 MG tablet Take 500 mg by mouth every 6 (six) hours as needed. For pain  . albuterol (PROVENTIL HFA;VENTOLIN HFA) 108 (90 Base) MCG/ACT inhaler Inhale 2 puffs into the lungs every 6 (six) hours as  needed.  Marland Kitchen aspirin 81 MG tablet Take 81 mg by mouth daily.    Marland Kitchen atorvastatin (LIPITOR) 20 MG tablet Take 1 tablet (20 mg total) by mouth daily.  . Cholecalciferol (VITAMIN D) 2000 units CAPS Take 2000 units once per day.  . diltiazem (TIAZAC) 300 MG 24 hr capsule Take 1 capsule (300 mg total) by mouth daily.  . fluticasone (FLONASE) 50 MCG/ACT nasal spray Place 2 sprays into both nostrils daily.  . isosorbide dinitrate (ISORDIL) 30 MG tablet TAKE 1 TABLET (30 MG TOTAL) BY MOUTH 3 (THREE)  TIMES DAILY.  Marland Kitchen levocetirizine (XYZAL) 5 MG tablet TAKE 1 TABLET BY MOUTH EVERY EVENING FOR ALLERGIES.  Marland Kitchen nitroGLYCERIN (NITROSTAT) 0.4 MG SL tablet Place 1 tablet (0.4 mg total) under the tongue every 5 (five) minutes as needed for chest pain. X 3 doses  . sertraline (ZOLOFT) 50 MG tablet Take 1 tablet (50 mg total) by mouth at bedtime.  . [DISCONTINUED] atorvastatin (LIPITOR) 20 MG tablet Take 1 tablet (20 mg total) by mouth daily.     Allergies:   Patient has no known allergies.   Social History   Tobacco Use  . Smoking status: Former Smoker    Packs/day: 0.25    Years: 30.00    Pack years: 7.50    Types: Cigarettes    Last attempt to quit: 05/30/2014    Years since quitting: 4.5  . Smokeless tobacco: Never Used  Substance Use Topics  . Alcohol use: Yes    Alcohol/week: 2.0 standard drinks    Types: 2 Standard drinks or equivalent per week    Comment: occ  . Drug use: No     Family Hx: The patient's family history includes Breast cancer (age of onset: 74) in her mother; Colon cancer in her unknown relative; Diabetes in her mother; Heart disease in her father and mother; Hyperlipidemia in her father; Prostate cancer in her father.  ROS:   Please see the history of present illness.    Review of Systems  Constitution: Positive for malaise/fatigue.  HENT: Negative.        Seasonal allergies  Eyes: Negative.   Cardiovascular: Negative.   Respiratory: Negative.   Hematologic/Lymphatic: Negative.   Musculoskeletal: Negative.  Negative for joint pain.  Gastrointestinal: Negative.   Genitourinary: Negative.   Neurological: Negative.     All other systems reviewed and are negative.   Prior CV studies:   The following studies were reviewed today:  Cardiac cath September 12, 2011: Left main: No obstructive disease. As noted above, pt with intense vasospasm at beginning of case.  Left Anterior Descending Artery: Ostial spasm however after NTG given, there is minimal  obstructive disease in the ostium with mild calcification. The diagonal has ostial 50% stenosis, unchanged from previous cath.  Circumflex Artery: During initial injections, there is a 99% mid stenosis, however, after NTG given this area responded well with minimal 20% stenosis. The proximal portion of the OM 1 also had 30% stenosis.  Right Coronary Artery: Large, dominant vessel with 30% mid stenosis.  Left Ventricular Angiogram: LVEF=55%.      Labs/Other Tests and Data Reviewed:    EKG:  No ECG reviewed.  Recent Labs: 02/24/2018: ALT 22 06/02/2018: BUN 13; Creatinine, Ser 0.77; Potassium 4.5; Sodium 141   Recent Lipid Panel Lab Results  Component Value Date/Time   CHOL 136 02/24/2018 08:08 AM   TRIG 62 02/24/2018 08:08 AM   HDL 72 02/24/2018 08:08 AM   CHOLHDL 1.9 02/24/2018 08:08  AM   CHOLHDL 2 09/26/2017 09:10 AM   LDLCALC 52 02/24/2018 08:08 AM    Wt Readings from Last 3 Encounters:  12/06/18 177 lb (80.3 kg)  09/04/18 178 lb 12 oz (81.1 kg)  06/09/18 177 lb (80.3 kg)     Objective:    Vital Signs:  BP 112/70   Pulse 90   Ht 5\' 4"  (1.626 m)   Wt 177 lb (80.3 kg)   LMP 07/28/2010   SpO2 95%   BMI 30.38 kg/m    Well nourished, well developed female in no  acute distress. No jvd or edema  ASSESSMENT & PLAN:    1. CAD with documented coronary spasm on cardiac cath in 2013 with moderate CAD. No angina On isosorbide and diltiazem 2. Essential hypertension BP well controlled 3. Hyperlipidemia LDL at goal on Lipitor 20 mg daily FLP in June 4. Prediabetes stable managed by PCP 5. Fatigue-check TSH, CBC, CMET in June or July and f/u with PCP  COVID-19 Education: The signs and symptoms of COVID-19 were discussed with the patient and how to seek care for testing (follow up with PCP or arrange E-visit).  The importance of social distancing was discussed today.  Time:   Today, I have spent 15 minutes with the patient with telehealth technology discussing the above  problems.     Medication Adjustments/Labs and Tests Ordered: Current medicines are reviewed at length with the patient today.  Concerns regarding medicines are outlined above.  Tests Ordered: Orders Placed This Encounter  Procedures  . TSH  . Comprehensive metabolic panel  . CBC  . Lipid panel   Medication Changes: Meds ordered this encounter  Medications  . atorvastatin (LIPITOR) 20 MG tablet    Sig: Take 1 tablet (20 mg total) by mouth daily.    Dispense:  90 tablet    Refill:  3    Disposition:  Follow up in 1 year(s) with Dr. Angelena Form  Signed, Ermalinda Barrios, PA-C  12/06/2018 2:31 PM    [place]

## 2018-12-25 ENCOUNTER — Telehealth: Payer: BLUE CROSS/BLUE SHIELD | Admitting: Physician Assistant

## 2018-12-25 DIAGNOSIS — H60332 Swimmer's ear, left ear: Secondary | ICD-10-CM

## 2018-12-25 MED ORDER — SULFAMETHOXAZOLE-TRIMETHOPRIM 800-160 MG PO TABS: 1.0000 | ORAL_TABLET | Freq: Two times a day (BID) | ORAL | 0 refills | Status: AC

## 2018-12-25 MED ORDER — CIPROFLOXACIN-HYDROCORTISONE 0.2-1 % OT SUSP: 3.0000 [drp] | Freq: Two times a day (BID) | OTIC | 0 refills | Status: DC

## 2018-12-25 NOTE — Progress Notes (Signed)
E Visit for Swimmer's Ear  We are sorry that you are not feeling well. Here is how we plan to help!  Based on what you have shared with me it looks like you have swimmers ear. Swimmer's ear is a redness or swelling, irritation, or infection of your outer ear canal.  These symptoms usually occur within a few days of swimming.  Your ear canal is a tube that goes from the opening of the ear to the eardrum.  When water stays in your ear canal, germs can grow.  This is a painful condition that often happens to children and swimmers of all ages.  It is not contagious and oral antibiotics are not required to treat uncomplicated swimmer's ear.  The usual symptoms include: Itching inside the ear, Redness or a sense of swelling in the ear, Pain when the ear is tugged on when pressure is placed on the ear, Pus draining from the infected ear. and I have prescribed: Ciprofloxin 0.2% and hydrocortisone 1% otic suspension 3 drops in affected ears twice daily for 7 days  Based on what you have told me you may have a bacterial infection. In addition to the ear drops I have prescribed an oral antibiotic: and Bactrim 800mg /160mg  one tablet by mouth twice a day for 10 days  In certain cases swimmer's ear may progress to a more serious bacterial infection of the middle or inner ear.  If you have a fever 102 and up and significantly worsening symptoms, this could indicate a more serious infection moving to the middle/inner and needs face to face evaluation in an office by a provider.  Your symptoms should improve over the next 3 days and should resolve in about 7 days.  HOME CARE:   Wash your hands frequently.  Do not place the tip of the bottle on your ear or touch it with your fingers.  You can take Acetominophen 650 mg every 4-6 hours as needed for pain.  If pain is severe or moderate, you can apply a heating pad (set on low) or hot water bottle (wrapped in a towel) to outer ear for 20 minutes.  This will also  increase drainage.  Avoid ear plugs  Do not use Q-tips  After showers, help the water run out by tilting your head to one side.  GET HELP RIGHT AWAY IF:   Fever is over 102.2 degrees.  You develop progressive ear pain or hearing loss.  Ear symptoms persist longer than 3 days after treatment.  MAKE SURE YOU:   Understand these instructions.  Will watch your condition.  Will get help right away if you are not doing well or get worse.  TO PREVENT SWIMMER'S EAR:  Use a bathing cap or custom fitted swim molds to keep your ears dry.  Towel off after swimming to dry your ears.  Tilt your head or pull your earlobes to allow the water to escape your ear canal.  If there is still water in your ears, consider using a hairdryer on the lowest setting.  Thank you for choosing an e-visit. Your e-visit answers were reviewed by a board certified advanced clinical practitioner to complete your personal care plan. Depending upon the condition, your plan could have included both over the counter or prescription medications. Please review your pharmacy choice. Be sure that the pharmacy you have chosen is open so that you can pick up your prescription now.  If there is a problem you may message your provider in [place]  to have the prescription routed to another pharmacy. Your safety is important to [place]. If you have drug allergies check your prescription carefully.  For the next 24 hours, you can use MyChart to ask questions about today's visit, request a non-urgent call back, or ask for a work or school excuse from your e-visit provider. You will get an email in the next two days asking about your experience. I hope that your e-visit has been valuable and will speed your recovery.   Approximately 5 minutes was spent documenting and reviewing patient's chart.   Konrad Felix, PA-C

## 2018-12-28 ENCOUNTER — Ambulatory Visit: Payer: BLUE CROSS/BLUE SHIELD | Admitting: Primary Care

## 2018-12-28 ENCOUNTER — Encounter: Payer: Self-pay | Admitting: Primary Care

## 2018-12-28 ENCOUNTER — Other Ambulatory Visit: Payer: Self-pay

## 2018-12-28 VITALS — BP 126/84 | HR 90 | Temp 98.1°F | Ht 64.0 in | Wt 179.0 lb

## 2018-12-28 DIAGNOSIS — H60502 Unspecified acute noninfective otitis externa, left ear: Secondary | ICD-10-CM

## 2018-12-28 MED ORDER — NEOMYCIN-POLYMYXIN-HC 3.5-10000-1 OT SOLN: 4.0000 [drp] | Freq: Four times a day (QID) | OTIC | 0 refills | Status: DC

## 2018-12-28 NOTE — Progress Notes (Signed)
Subjective:    Patient ID: Caroline [name], female    DOB: June 27, 1961, 58 y.o.   MRN: [id number]  HPI  Caroline [name] is a 58 year old female with a history of asthma, recurrent cough, environmental allergies who presents today with a chief complaint of otalgia.  She was evaluated on 12/25/18 through an E-Visit for otalgia and was diagnosed with acute otitis externa. She was prescribed Ciprofloxacin 0.2%/Hydrocortisone 1% otic suspension and Bactrim DS tablets for 5 day supply.  Since her E-Visit she never picked up the ear drops as they were too costly. She did pick up the Bactrim DS tablets and has not yet started. Her pain is located to the left ear which is also itchy and draining. Symptoms have been present 3-4 months ago. She denies swimming or being submerged in water.  Also with post nasal drip, left sinus pressure, sneezing, itchy/watery eyes. She denies fevers. She is compliant to Xyzal and Flonase.   Review of Systems  Constitutional: Negative for chills and fever.  HENT: Positive for ear pain, postnasal drip, sinus pressure and sneezing. Negative for congestion and sore throat.        Ear itching  Respiratory: Negative for cough.   Allergic/Immunologic: Positive for environmental allergies.       Past Medical History:  Diagnosis Date  . CAD (coronary artery disease)    has documented coronary vasospasm but with underlying 40% prox LAD, 30% prox LCX and 70 to 80% proximal 1st DX and 30% RCA; recurrent vasospasm with subsequent cath in Jan 2013.   Marland Kitchen Coronary vasospasm ([place])   . Environmental and seasonal allergies   . Esophagitis   . GERD (gastroesophageal reflux disease)   . Hyperlipidemia   . Hypertension   . IBS (irritable bowel syndrome)   . Palpitations    tachycardia on holter; treated with beta blockers     Social History   Socioeconomic History  . Marital status: Married    Spouse name: Not on file  . Number of children: 2  . Years of education: Not on file   . Highest education level: Not on file  Occupational History  . Occupation: Unemployed  Social Needs  . Financial resource strain: Not on file  . Food insecurity:    Worry: Not on file    Inability: Not on file  . Transportation needs:    Medical: Not on file    Non-medical: Not on file  Tobacco Use  . Smoking status: Former Smoker    Packs/day: 0.25    Years: 30.00    Pack years: 7.50    Types: Cigarettes    Last attempt to quit: 05/30/2014    Years since quitting: 4.5  . Smokeless tobacco: Never Used  Substance and Sexual Activity  . Alcohol use: Yes    Alcohol/week: 2.0 standard drinks    Types: 2 Standard drinks or equivalent per week    Comment: occ  . Drug use: No  . Sexual activity: Yes  Lifestyle  . Physical activity:    Days per week: Not on file    Minutes per session: Not on file  . Stress: Not on file  Relationships  . Social connections:    Talks on phone: Not on file    Gets together: Not on file    Attends religious service: Not on file    Active member of club or organization: Not on file    Attends meetings of clubs or organizations: Not on file  Relationship status: Not on file  . Intimate partner violence:    Fear of current or ex partner: Not on file    Emotionally abused: Not on file    Physically abused: Not on file    Forced sexual activity: Not on file  Other Topics Concern  . Not on file  Social History Narrative   Married.   2 children.   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   She enjoys being with her dogs, shopping.     Past Surgical History:  Procedure Laterality Date  . APPENDECTOMY  1976  . BREAST EXCISIONAL BIOPSY Right    benign  . BREAST SURGERY  2002   biopsy  . CARDIAC CATHETERIZATION  2011  . CARDIAC CATHETERIZATION  Sept 2012   With documented vasospasm but with residual disease; managed medically  . CHOLECYSTECTOMY  1982  . LEFT HEART CATHETERIZATION WITH CORONARY ANGIOGRAM N/A 09/14/2011   Procedure: LEFT HEART  CATHETERIZATION WITH CORONARY ANGIOGRAM;  Surgeon: Burnell Blanks, MD;  Location: [facility] CATH LAB;  Service: Cardiovascular;  Laterality: N/A;  . UPPER GASTROINTESTINAL ENDOSCOPY  01/11/2011   esophagitis    Family History  Problem Relation Age of Onset  . Breast cancer Mother 96  . Heart disease Mother        died CHF 70  . Diabetes Mother   . Prostate cancer Father   . Hyperlipidemia Father   . Heart disease Father        s/p cabg - alive  . Colon cancer Unknown        First cousin on dads side     No Known Allergies  Current Outpatient Medications on File Prior to Visit  Medication Sig Dispense Refill  . acetaminophen (TYLENOL) 500 MG tablet Take 500 mg by mouth every 6 (six) hours as needed. For pain    . albuterol (PROVENTIL HFA;VENTOLIN HFA) 108 (90 Base) MCG/ACT inhaler Inhale 2 puffs into the lungs every 6 (six) hours as needed. 1 Inhaler 0  . aspirin 81 MG tablet Take 81 mg by mouth daily.      Marland Kitchen atorvastatin (LIPITOR) 20 MG tablet Take 1 tablet (20 mg total) by mouth daily. 90 tablet 3  . Cholecalciferol (VITAMIN D) 2000 units CAPS Take 2000 units once per day. 30 capsule   . diltiazem (TIAZAC) 300 MG 24 hr capsule Take 1 capsule (300 mg total) by mouth daily. 90 capsule 3  . fluticasone (FLONASE) 50 MCG/ACT nasal spray Place 2 sprays into both nostrils daily. 16 g 6  . isosorbide dinitrate (ISORDIL) 30 MG tablet TAKE 1 TABLET (30 MG TOTAL) BY MOUTH 3 (THREE) TIMES DAILY. 270 tablet 2  . levocetirizine (XYZAL) 5 MG tablet TAKE 1 TABLET BY MOUTH EVERY EVENING FOR ALLERGIES. 90 tablet 0  . nitroGLYCERIN (NITROSTAT) 0.4 MG SL tablet Place 1 tablet (0.4 mg total) under the tongue every 5 (five) minutes as needed for chest pain. X 3 doses 75 tablet 1  . sertraline (ZOLOFT) 50 MG tablet Take 1 tablet (50 mg total) by mouth at bedtime. 90 tablet 3  . ciprofloxacin-hydrocortisone (CIPRO HC) OTIC suspension Place 3 drops into the left ear 2 (two) times daily for 7 days. (Patient  not taking: Reported on 12/28/2018) 2.1 mL 0  . sulfamethoxazole-trimethoprim (BACTRIM DS) 800-160 MG tablet Take 1 tablet by mouth 2 (two) times daily for 10 days. (Patient not taking: Reported on 12/28/2018) 20 tablet 0   No current facility-administered medications on file prior to visit.  BP 126/84   Pulse 90   Temp 98.1 F (36.7 C) (Oral)   Ht 5\' 4"  (1.626 m)   Wt 179 lb (81.2 kg)   LMP 07/28/2010   BMI 30.73 kg/m    Objective:   Physical Exam  Constitutional: She appears well-nourished. She does not appear ill.  HENT:  Right Ear: Tympanic membrane and ear canal normal.  Left Ear: Ear canal normal. There is swelling. Tympanic membrane is erythematous. Tympanic membrane is not bulging.  Erythema to left canal, mild swelling.  Neck: Neck supple.  Cardiovascular: Normal rate.  Respiratory: Effort normal.  Skin: Skin is warm and dry.           Assessment & Plan:

## 2018-12-28 NOTE — Patient Instructions (Addendum)
Start the neomycin-polymyxin-hydrocortisone ear drops. Instill 4 drops into the left ear 3-4 times daily for one week.  Start the Bactrim DS antibiotic tablets for the ear infection.  Please call me if the medication is too expensive and/or if your symptoms do not improve within 3-4 days.  It was a pleasure to see you today!

## 2018-12-28 NOTE — Assessment & Plan Note (Signed)
Evident to left canal, also with early involvement to left TM. Rx for Cortisporin otic drops sent to pharmacy. Discussed to start Bactrim DS tablets as prescribed. She will call if the drops are too costly. Return precautions provided.

## 2019-03-08 ENCOUNTER — Telehealth: Payer: Self-pay | Admitting: Primary Care

## 2019-03-08 DIAGNOSIS — J3089 Other allergic rhinitis: Secondary | ICD-10-CM

## 2019-03-08 MED ORDER — LEVOCETIRIZINE DIHYDROCHLORIDE 5 MG PO TABS: 5.0000 mg | ORAL_TABLET | Freq: Every evening | ORAL | 0 refills | Status: DC | PRN

## 2019-03-08 NOTE — Telephone Encounter (Signed)
Caroline [name], it looks like lab orders are in the system, can they be drawn in our clinic?  Refill of Xyzal sent to pharmacy.

## 2019-03-08 NOTE — Telephone Encounter (Signed)
Patient stated that she had a virtual visit with [facility]. She stated they advised her to contact our office to see if she could have labs done to check all her levels. She would like to know if you are able to order these for her and we can call her to schedule lab appointment    Patient also stated she had a Prescription (levocetirizine) that does not have refills. She would like to know if this could be sent back into her pharmacy since she is having trouble with her allergies again  [facility] [place].     Patient's C/B # [id number]

## 2019-03-09 NOTE — Telephone Encounter (Signed)
Patient scheduled.

## 2019-03-13 ENCOUNTER — Other Ambulatory Visit: Payer: Self-pay | Admitting: Primary Care

## 2019-03-13 ENCOUNTER — Other Ambulatory Visit (INDEPENDENT_AMBULATORY_CARE_PROVIDER_SITE_OTHER): Payer: 59

## 2019-03-13 ENCOUNTER — Other Ambulatory Visit: Payer: Self-pay

## 2019-03-13 DIAGNOSIS — I2510 Atherosclerotic heart disease of native coronary artery without angina pectoris: Secondary | ICD-10-CM | POA: Diagnosis not present

## 2019-03-13 DIAGNOSIS — R7303 Prediabetes: Secondary | ICD-10-CM

## 2019-03-13 DIAGNOSIS — I10 Essential (primary) hypertension: Secondary | ICD-10-CM

## 2019-03-13 DIAGNOSIS — I201 Angina pectoris with documented spasm: Secondary | ICD-10-CM

## 2019-03-13 DIAGNOSIS — R5383 Other fatigue: Secondary | ICD-10-CM

## 2019-03-13 DIAGNOSIS — E785 Hyperlipidemia, unspecified: Secondary | ICD-10-CM

## 2019-03-14 LAB — CBC
Hematocrit: 45.4 % (ref 34.0–46.6)
Hemoglobin: 14.9 g/dL (ref 11.1–15.9)
MCH: 32.3 pg (ref 26.6–33.0)
MCHC: 32.8 g/dL (ref 31.5–35.7)
MCV: 98 fL — ABNORMAL HIGH (ref 79–97)
Platelets: 207 10*3/uL (ref 150–450)
RBC: 4.62 x10E6/uL (ref 3.77–5.28)
RDW: 12.8 % (ref 11.7–15.4)
WBC: 8.8 10*3/uL (ref 3.4–10.8)

## 2019-03-14 LAB — TSH: TSH: 9.3 u[IU]/mL — ABNORMAL HIGH (ref 0.450–4.500)

## 2019-03-14 LAB — COMPREHENSIVE METABOLIC PANEL
ALT: 23 IU/L (ref 0–32)
AST: 22 IU/L (ref 0–40)
Albumin/Globulin Ratio: 1.8 (ref 1.2–2.2)
Albumin: 4.6 g/dL (ref 3.8–4.9)
Alkaline Phosphatase: 75 IU/L (ref 39–117)
BUN/Creatinine Ratio: 17 (ref 9–23)
BUN: 12 mg/dL (ref 6–24)
Bilirubin Total: 0.3 mg/dL (ref 0.0–1.2)
CO2: 23 mmol/L (ref 20–29)
Calcium: 8.9 mg/dL (ref 8.7–10.2)
Chloride: 100 mmol/L (ref 96–106)
Creatinine, Ser: 0.71 mg/dL (ref 0.57–1.00)
GFR calc Af Amer: 108 mL/min/{1.73_m2} (ref >59)
GFR calc non Af Amer: 94 mL/min/{1.73_m2} (ref >59)
Globulin, Total: 2.5 g/dL (ref 1.5–4.5)
Glucose: 113 mg/dL — ABNORMAL HIGH (ref 65–99)
Potassium: 4.2 mmol/L (ref 3.5–5.2)
Sodium: 140 mmol/L (ref 134–144)
Total Protein: 7.1 g/dL (ref 6.0–8.5)

## 2019-03-14 LAB — LIPID PANEL
Chol/HDL Ratio: 1.9 ratio (ref 0.0–4.4)
Cholesterol, Total: 168 mg/dL (ref 100–199)
HDL: 89 mg/dL (ref >39)
LDL Calculated: 62 mg/dL (ref 0–99)
Triglycerides: 84 mg/dL (ref 0–149)
VLDL Cholesterol Cal: 17 mg/dL (ref 5–40)

## 2019-03-15 ENCOUNTER — Encounter: Payer: Self-pay | Admitting: Primary Care

## 2019-03-15 ENCOUNTER — Other Ambulatory Visit: Payer: Self-pay

## 2019-03-15 ENCOUNTER — Ambulatory Visit (INDEPENDENT_AMBULATORY_CARE_PROVIDER_SITE_OTHER): Payer: 59 | Admitting: Primary Care

## 2019-03-15 DIAGNOSIS — E039 Hypothyroidism, unspecified: Secondary | ICD-10-CM | POA: Insufficient documentation

## 2019-03-15 MED ORDER — LEVOTHYROXINE SODIUM 50 MCG PO TABS: ORAL_TABLET | ORAL | 1 refills | Status: DC

## 2019-03-15 NOTE — Assessment & Plan Note (Signed)
TSH of 9, no Free T4 on file. Given this reading coupled with her symptoms and family history we have enough information to treat for hypothyroidism. Rx for levothyroxine 50 mcg tablets sent to pharmacy.  Discussed proper instructions for levothyroxine administration. We will plan to see her back in the office in 6 weeks for follow up.

## 2019-03-15 NOTE — Patient Instructions (Signed)
Start levothyroxine 50 mcg tablets for underactive thyroid.   Be sure to take your levothyroxine (thyroid medication) every morning on an empty stomach with water only. No food or other medications for 30 minutes. No heartburn medication, iron pills, calcium, vitamin D, or magnesium pills within four hours of taking levothyroxine.   Schedule a follow up visit for 6 weeks as discussed.  It was a pleasure to see you today!

## 2019-03-15 NOTE — Progress Notes (Signed)
Subjective:    Patient ID: Caroline [name], female    DOB: 1961-04-11, 58 y.o.   MRN: [id number]  HPI  Virtual Visit via Video Note  I connected with Caroline [name] on 03/15/19 at  8:40 AM EDT by a video enabled telemedicine application and verified that I am speaking with the correct person using two identifiers.  Location: Patient: Home Provider: Office   I discussed the limitations of evaluation and management by telemedicine and the availability of in person appointments. The patient expressed understanding and agreed to proceed.  History of Present Illness:  Ms. Penick is a 58 year old female with a history of CAD, hypertension, asthma, IBS,   Labs recently checked by cardiology for symptoms of fatigue and also for general follow up which revealed TSH of 9.3.   Today she endorses extreme fatigue which she's noticed for the last several months. Also wanting to sleep "all the time", joint aches. She's having a hard time walking up her stairs in her home due to the fatigue. She has a family history of thyroid disease in her maternal grandmother and two maternal aunts.    Observations/Objective:  Alert and oriented. Appears well, not sickly. No distress. Speaking in complete sentences.   Assessment and Plan:  TSH of 9, no Free T4 on file. Given this reading coupled with her symptoms and family history we have enough information to treat for hypothyroidism. Rx for levothyroxine 50 mcg tablets sent to pharmacy.  Discussed proper instructions for levothyroxine administration. We will plan to see her back in the office in 6 weeks for follow up.   Follow Up Instructions:  Start levothyroxine 50 mcg tablets for underactive thyroid.   Be sure to take your levothyroxine (thyroid medication) every morning on an empty stomach with water only. No food or other medications for 30 minutes. No heartburn medication, iron pills, calcium, vitamin D, or magnesium pills within four hours  of taking levothyroxine.   Schedule a follow up visit for 6 weeks as discussed.  It was a pleasure to see you today!    I discussed the assessment and treatment plan with the patient. The patient was provided an opportunity to ask questions and all were answered. The patient agreed with the plan and demonstrated an understanding of the instructions.   The patient was advised to call back or seek an in-person evaluation if the symptoms worsen or if the condition fails to improve as anticipated.     Pleas Koch, NP    Review of Systems  Constitutional: Positive for fatigue.  Eyes: Negative for visual disturbance.  Respiratory: Negative for shortness of breath.   Cardiovascular: Negative for chest pain.  Endocrine: Negative for cold intolerance.  Musculoskeletal: Positive for arthralgias.  Neurological: Negative for dizziness.       Past Medical History:  Diagnosis Date  . CAD (coronary artery disease)    has documented coronary vasospasm but with underlying 40% prox LAD, 30% prox LCX and 70 to 80% proximal 1st DX and 30% RCA; recurrent vasospasm with subsequent cath in Jan 2013.   Marland Kitchen Coronary vasospasm ([place])   . Environmental and seasonal allergies   . Esophagitis   . GERD (gastroesophageal reflux disease)   . Hyperlipidemia   . Hypertension   . IBS (irritable bowel syndrome)   . Palpitations    tachycardia on holter; treated with beta blockers     Social History   Socioeconomic History  . Marital status: Married  Spouse name: Not on file  . Number of children: 2  . Years of education: Not on file  . Highest education level: Not on file  Occupational History  . Occupation: Unemployed  Social Needs  . Financial resource strain: Not on file  . Food insecurity    Worry: Not on file    Inability: Not on file  . Transportation needs    Medical: Not on file    Non-medical: Not on file  Tobacco Use  . Smoking status: Former Smoker    Packs/day: 0.25     Years: 30.00    Pack years: 7.50    Types: Cigarettes    Quit date: 05/30/2014    Years since quitting: 4.7  . Smokeless tobacco: Never Used  Substance and Sexual Activity  . Alcohol use: Yes    Alcohol/week: 2.0 standard drinks    Types: 2 Standard drinks or equivalent per week    Comment: occ  . Drug use: No  . Sexual activity: Yes  Lifestyle  . Physical activity    Days per week: Not on file    Minutes per session: Not on file  . Stress: Not on file  Relationships  . Social Herbalist on phone: Not on file    Gets together: Not on file    Attends religious service: Not on file    Active member of club or organization: Not on file    Attends meetings of clubs or organizations: Not on file    Relationship status: Not on file  . Intimate partner violence    Fear of current or ex partner: Not on file    Emotionally abused: Not on file    Physically abused: Not on file    Forced sexual activity: Not on file  Other Topics Concern  . Not on file  Social History Narrative   Married.   2 children.   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   She enjoys being with her dogs, shopping.     Past Surgical History:  Procedure Laterality Date  . APPENDECTOMY  1976  . BREAST EXCISIONAL BIOPSY Right    benign  . BREAST SURGERY  2002   biopsy  . CARDIAC CATHETERIZATION  2011  . CARDIAC CATHETERIZATION  Sept 2012   With documented vasospasm but with residual disease; managed medically  . CHOLECYSTECTOMY  1982  . LEFT HEART CATHETERIZATION WITH CORONARY ANGIOGRAM N/A 09/14/2011   Procedure: LEFT HEART CATHETERIZATION WITH CORONARY ANGIOGRAM;  Surgeon: Burnell Blanks, MD;  Location: [facility] CATH LAB;  Service: Cardiovascular;  Laterality: N/A;  . UPPER GASTROINTESTINAL ENDOSCOPY  01/11/2011   esophagitis    Family History  Problem Relation Age of Onset  . Breast cancer Mother 24  . Heart disease Mother        died CHF 52  . Diabetes Mother   . Prostate cancer Father    . Hyperlipidemia Father   . Heart disease Father        s/p cabg - alive  . Colon cancer Other        First cousin on dads side   . Thyroid disease Maternal Grandmother     No Known Allergies  Current Outpatient Medications on File Prior to Visit  Medication Sig Dispense Refill  . acetaminophen (TYLENOL) 500 MG tablet Take 500 mg by mouth every 6 (six) hours as needed. For pain    . albuterol (PROVENTIL HFA;VENTOLIN HFA) 108 (90 Base) MCG/ACT inhaler Inhale  2 puffs into the lungs every 6 (six) hours as needed. 1 Inhaler 0  . aspirin 81 MG tablet Take 81 mg by mouth daily.      Marland Kitchen atorvastatin (LIPITOR) 20 MG tablet Take 1 tablet (20 mg total) by mouth daily. 90 tablet 3  . Cholecalciferol (VITAMIN D) 2000 units CAPS Take 2000 units once per day. 30 capsule   . diltiazem (TIAZAC) 300 MG 24 hr capsule Take 1 capsule (300 mg total) by mouth daily. 90 capsule 3  . fluticasone (FLONASE) 50 MCG/ACT nasal spray Place 2 sprays into both nostrils daily. 16 g 6  . isosorbide dinitrate (ISORDIL) 30 MG tablet TAKE 1 TABLET (30 MG TOTAL) BY MOUTH 3 (THREE) TIMES DAILY. 270 tablet 2  . levocetirizine (XYZAL) 5 MG tablet Take 1 tablet (5 mg total) by mouth at bedtime as needed for allergies. 90 tablet 0  . neomycin-polymyxin-hydrocortisone (CORTISPORIN) OTIC solution Place 4 drops into the left ear 4 (four) times daily. 10 mL 0  . nitroGLYCERIN (NITROSTAT) 0.4 MG SL tablet Place 1 tablet (0.4 mg total) under the tongue every 5 (five) minutes as needed for chest pain. X 3 doses 75 tablet 1  . sertraline (ZOLOFT) 50 MG tablet Take 1 tablet (50 mg total) by mouth at bedtime. 90 tablet 3   No current facility-administered medications on file prior to visit.     Wt 179 lb (81.2 kg)   LMP 07/28/2010   BMI 30.73 kg/m    Objective:   Physical Exam  Constitutional: She is oriented to person, place, and time. She appears well-nourished.  Respiratory: Effort normal.  Neurological: She is alert and  oriented to person, place, and time.  Psychiatric: She has a normal mood and affect.           Assessment & Plan:

## 2019-03-31 DIAGNOSIS — Z20828 Contact with and (suspected) exposure to other viral communicable diseases: Secondary | ICD-10-CM

## 2019-03-31 DIAGNOSIS — Z20822 Contact with and (suspected) exposure to covid-19: Secondary | ICD-10-CM

## 2019-04-02 NOTE — Telephone Encounter (Signed)
Patient is calling back to check status of my chart message.   Patient stated she would like to go ahead and be tested.  She is not having any symptom, she has just been exposed   C/B # [phone]

## 2019-04-03 ENCOUNTER — Other Ambulatory Visit: Payer: Self-pay

## 2019-04-03 DIAGNOSIS — Z20822 Contact with and (suspected) exposure to covid-19: Secondary | ICD-10-CM

## 2019-04-04 LAB — NOVEL CORONAVIRUS, NAA: SARS-CoV-2, NAA: NOT DETECTED

## 2019-04-26 ENCOUNTER — Ambulatory Visit: Payer: 59 | Admitting: Primary Care

## 2019-04-26 ENCOUNTER — Encounter: Payer: Self-pay | Admitting: Primary Care

## 2019-04-26 ENCOUNTER — Other Ambulatory Visit: Payer: Self-pay

## 2019-04-26 VITALS — BP 126/82 | HR 91 | Temp 98.0°F | Ht 64.0 in | Wt 176.8 lb

## 2019-04-26 DIAGNOSIS — E039 Hypothyroidism, unspecified: Secondary | ICD-10-CM

## 2019-04-26 DIAGNOSIS — F419 Anxiety disorder, unspecified: Secondary | ICD-10-CM | POA: Diagnosis not present

## 2019-04-26 DIAGNOSIS — J3089 Other allergic rhinitis: Secondary | ICD-10-CM

## 2019-04-26 DIAGNOSIS — J45909 Unspecified asthma, uncomplicated: Secondary | ICD-10-CM | POA: Diagnosis not present

## 2019-04-26 DIAGNOSIS — Z23 Encounter for immunization: Secondary | ICD-10-CM | POA: Diagnosis not present

## 2019-04-26 DIAGNOSIS — F32A Depression, unspecified: Secondary | ICD-10-CM | POA: Insufficient documentation

## 2019-04-26 LAB — TSH: TSH: 1.52 u[IU]/mL (ref 0.35–4.50)

## 2019-04-26 MED ORDER — LEVOCETIRIZINE DIHYDROCHLORIDE 5 MG PO TABS: 5.0000 mg | ORAL_TABLET | Freq: Every evening | ORAL | 3 refills | Status: DC | PRN

## 2019-04-26 MED ORDER — ALBUTEROL SULFATE HFA 108 (90 BASE) MCG/ACT IN AERS: 2.0000 | INHALATION_SPRAY | Freq: Four times a day (QID) | RESPIRATORY_TRACT | 0 refills | Status: DC | PRN

## 2019-04-26 MED ORDER — SERTRALINE HCL 50 MG PO TABS: 50.0000 mg | ORAL_TABLET | Freq: Every day | ORAL | 3 refills | Status: DC

## 2019-04-26 NOTE — Assessment & Plan Note (Addendum)
Refill provided of Zoloft 50 mg, doing well overall, continue same.

## 2019-04-26 NOTE — Assessment & Plan Note (Signed)
Doing well on Xyzal nightly, continue same. Refill sent to pharmacy.

## 2019-04-26 NOTE — Patient Instructions (Addendum)
Stop by the lab prior to leaving today. I will notify you of your results once received.   Be sure to take your levothyroxine (thyroid medication) every morning on an empty stomach with water only. No food or other medications for 30 minutes. No heartburn medication, iron pills, calcium, vitamin D, or magnesium pills within four hours of taking levothyroxine.   You are due for your physical in mid October 2020, please schedule this at your convenience.  It was a pleasure to see you today!

## 2019-04-26 NOTE — Progress Notes (Signed)
Subjective:    Patient ID: Caroline [name], female    DOB: 11/01/1960, 58 y.o.   MRN: [id number]  HPI  Caroline [name] is a 58 year old female who presents today for follow up of hypothyroidism. She is also needing some medication refills.  She was last evaluated virtually for follow up of elevated TSH that was noted per cardiology. During her virtual visit she endorsed fatigue, wanting to "sleep all the time", joint aches. She endorsed a family history of thyroid disease in her grandmother. No free T4 was on file. Given her symptoms, family history, and TSH of 9 we decided to treat with levothyroxine 50 mcg.   Since her last visit she continues to feel "tired a lot" but believes that she's noticed some improvement. She does notice hair loss in the shower when washing her hair. She denies chest pain, palpitations, dizziness.   BP Readings from Last 3 Encounters:  04/26/19 126/82  12/28/18 126/84  12/06/18 112/70     Review of Systems  Constitutional: Positive for fatigue.  HENT: Negative for trouble swallowing.   Eyes: Negative for visual disturbance.  Respiratory: Negative for shortness of breath.   Cardiovascular: Negative for chest pain and palpitations.  Endocrine: Negative for cold intolerance.  Skin:       Hair loss       Past Medical History:  Diagnosis Date  . CAD (coronary artery disease)    has documented coronary vasospasm but with underlying 40% prox LAD, 30% prox LCX and 70 to 80% proximal 1st DX and 30% RCA; recurrent vasospasm with subsequent cath in Jan 2013.   Marland Kitchen Coronary vasospasm ([place])   . Environmental and seasonal allergies   . Esophagitis   . GERD (gastroesophageal reflux disease)   . Hyperlipidemia   . Hypertension   . IBS (irritable bowel syndrome)   . Palpitations    tachycardia on holter; treated with beta blockers     Social History   Socioeconomic History  . Marital status: Married    Spouse name: Not on file  . Number of children: 2  .  Years of education: Not on file  . Highest education level: Not on file  Occupational History  . Occupation: Unemployed  Social Needs  . Financial resource strain: Not on file  . Food insecurity    Worry: Not on file    Inability: Not on file  . Transportation needs    Medical: Not on file    Non-medical: Not on file  Tobacco Use  . Smoking status: Former Smoker    Packs/day: 0.25    Years: 30.00    Pack years: 7.50    Types: Cigarettes    Quit date: 05/30/2014    Years since quitting: 4.9  . Smokeless tobacco: Never Used  Substance and Sexual Activity  . Alcohol use: Yes    Alcohol/week: 2.0 standard drinks    Types: 2 Standard drinks or equivalent per week    Comment: occ  . Drug use: No  . Sexual activity: Yes  Lifestyle  . Physical activity    Days per week: Not on file    Minutes per session: Not on file  . Stress: Not on file  Relationships  . Social Herbalist on phone: Not on file    Gets together: Not on file    Attends religious service: Not on file    Active member of club or organization: Not on file    Attends  meetings of clubs or organizations: Not on file    Relationship status: Not on file  . Intimate partner violence    Fear of current or ex partner: Not on file    Emotionally abused: Not on file    Physically abused: Not on file    Forced sexual activity: Not on file  Other Topics Concern  . Not on file  Social History Narrative   Married.   2 children.   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   She enjoys being with her dogs, shopping.     Past Surgical History:  Procedure Laterality Date  . APPENDECTOMY  1976  . BREAST EXCISIONAL BIOPSY Right    benign  . BREAST SURGERY  2002   biopsy  . CARDIAC CATHETERIZATION  2011  . CARDIAC CATHETERIZATION  Sept 2012   With documented vasospasm but with residual disease; managed medically  . CHOLECYSTECTOMY  1982  . LEFT HEART CATHETERIZATION WITH CORONARY ANGIOGRAM N/A 09/14/2011    Procedure: LEFT HEART CATHETERIZATION WITH CORONARY ANGIOGRAM;  Surgeon: Burnell Blanks, MD;  Location: [facility] CATH LAB;  Service: Cardiovascular;  Laterality: N/A;  . UPPER GASTROINTESTINAL ENDOSCOPY  01/11/2011   esophagitis    Family History  Problem Relation Age of Onset  . Breast cancer Mother 73  . Heart disease Mother        died CHF 80  . Diabetes Mother   . Prostate cancer Father   . Hyperlipidemia Father   . Heart disease Father        s/p cabg - alive  . Colon cancer Other        First cousin on dads side   . Thyroid disease Maternal Grandmother     No Known Allergies  Current Outpatient Medications on File Prior to Visit  Medication Sig Dispense Refill  . acetaminophen (TYLENOL) 500 MG tablet Take 500 mg by mouth every 6 (six) hours as needed. For pain    . aspirin 81 MG tablet Take 81 mg by mouth daily.      Marland Kitchen atorvastatin (LIPITOR) 20 MG tablet Take 1 tablet (20 mg total) by mouth daily. 90 tablet 3  . Cholecalciferol (VITAMIN D) 2000 units CAPS Take 2000 units once per day. 30 capsule   . diltiazem (TIAZAC) 300 MG 24 hr capsule Take 1 capsule (300 mg total) by mouth daily. 90 capsule 3  . fluticasone (FLONASE) 50 MCG/ACT nasal spray Place 2 sprays into both nostrils daily. 16 g 6  . isosorbide dinitrate (ISORDIL) 30 MG tablet TAKE 1 TABLET (30 MG TOTAL) BY MOUTH 3 (THREE) TIMES DAILY. 270 tablet 2  . levothyroxine (SYNTHROID) 50 MCG tablet Take 1 tablet by mouth every morning on an empty stomach. No food or other medications for 30 minutes. 30 tablet 1  . nitroGLYCERIN (NITROSTAT) 0.4 MG SL tablet Place 1 tablet (0.4 mg total) under the tongue every 5 (five) minutes as needed for chest pain. X 3 doses 75 tablet 1   No current facility-administered medications on file prior to visit.     BP 126/82   Pulse 91   Temp 98 F (36.7 C) (Temporal)   Wt 176 lb 12 oz (80.2 kg)   LMP 07/28/2010   SpO2 98%   BMI 30.34 kg/m    Objective:   Physical Exam   Constitutional: She appears well-nourished.  Neck: Neck supple. No thyromegaly present.  Cardiovascular: Normal rate and regular rhythm.  Respiratory: Effort normal and breath sounds normal.  Skin:  Skin is warm and dry.           Assessment & Plan:

## 2019-04-26 NOTE — Assessment & Plan Note (Signed)
Using albuterol inhaler infrequently, refill provided today. Continue to monitor.

## 2019-04-26 NOTE — Assessment & Plan Note (Signed)
Compliant to levothyroxine and is taking appropriately.  Repeat TSH pending. Will send refills once labs have returned as we may need to adjust the dose.

## 2019-04-26 NOTE — Addendum Note (Signed)
Addended by: Jacqualin Combes on: 04/26/2019 11:28 AM   Modules accepted: Orders

## 2019-05-05 ENCOUNTER — Other Ambulatory Visit: Payer: Self-pay | Admitting: Primary Care

## 2019-05-05 DIAGNOSIS — E039 Hypothyroidism, unspecified: Secondary | ICD-10-CM

## 2019-05-16 ENCOUNTER — Other Ambulatory Visit: Payer: Self-pay | Admitting: Cardiovascular Disease

## 2019-05-21 ENCOUNTER — Other Ambulatory Visit: Payer: Self-pay | Admitting: Family Medicine

## 2019-05-21 DIAGNOSIS — F419 Anxiety disorder, unspecified: Secondary | ICD-10-CM

## 2019-06-23 ENCOUNTER — Other Ambulatory Visit: Payer: Self-pay | Admitting: Primary Care

## 2019-06-23 DIAGNOSIS — J45909 Unspecified asthma, uncomplicated: Secondary | ICD-10-CM

## 2019-07-23 ENCOUNTER — Other Ambulatory Visit: Payer: Self-pay | Admitting: Primary Care

## 2019-07-23 DIAGNOSIS — N644 Mastodynia: Secondary | ICD-10-CM

## 2019-07-23 DIAGNOSIS — Z1231 Encounter for screening mammogram for malignant neoplasm of breast: Secondary | ICD-10-CM

## 2019-08-06 ENCOUNTER — Ambulatory Visit
Admission: RE | Admit: 2019-08-06 | Discharge: 2019-08-06 | Disposition: A | Discharge status: Home / Self Care | Payer: 59 | Source: Ambulatory Visit | Attending: Primary Care | Admitting: Primary Care

## 2019-08-06 ENCOUNTER — Ambulatory Visit: Payer: 59

## 2019-08-06 ENCOUNTER — Other Ambulatory Visit: Payer: Self-pay

## 2019-08-06 DIAGNOSIS — Z1231 Encounter for screening mammogram for malignant neoplasm of breast: Secondary | ICD-10-CM

## 2019-08-06 DIAGNOSIS — N644 Mastodynia: Secondary | ICD-10-CM

## 2019-08-16 ENCOUNTER — Other Ambulatory Visit: Payer: Self-pay | Admitting: Cardiovascular Disease

## 2019-08-16 ENCOUNTER — Other Ambulatory Visit: Payer: Self-pay

## 2019-08-16 MED ORDER — ISOSORBIDE DINITRATE 30 MG PO TABS: ORAL_TABLET | ORAL | 0 refills | Status: DC

## 2019-11-11 ENCOUNTER — Other Ambulatory Visit: Payer: Self-pay | Admitting: Cardiovascular Disease

## 2019-11-30 ENCOUNTER — Ambulatory Visit: Payer: 59 | Attending: Internal Medicine

## 2019-11-30 DIAGNOSIS — Z23 Encounter for immunization: Secondary | ICD-10-CM

## 2019-11-30 NOTE — Progress Notes (Signed)
   Covid-19 Vaccination Clinic  Name:  Caroline [name]    MRN: [id number] DOB: 1961-05-25  11/30/2019  [name] was observed post Covid-19 immunization for 15 minutes without incident. She was provided with Vaccine Information Sheet and instruction to access the V-Safe system.   [name] was instructed to call 911 with any severe reactions post vaccine: Marland Kitchen Difficulty breathing  . Swelling of face and throat  . A fast heartbeat  . A bad rash all over body  . Dizziness and weakness   Immunizations Administered    Name Date Dose VIS Date Route   Pfizer COVID-19 Vaccine 11/30/2019 11:11 AM 0.3 mL 08/10/2019 Intramuscular   Manufacturer: [employer], [employer]   Lot: [id number]   [place]: [id number]

## 2019-12-02 ENCOUNTER — Other Ambulatory Visit: Payer: Self-pay | Admitting: Primary Care

## 2019-12-02 DIAGNOSIS — J45909 Unspecified asthma, uncomplicated: Secondary | ICD-10-CM

## 2019-12-12 ENCOUNTER — Other Ambulatory Visit: Payer: Self-pay | Admitting: Cardiovascular Disease

## 2019-12-24 ENCOUNTER — Ambulatory Visit: Payer: 59 | Attending: Internal Medicine

## 2019-12-24 DIAGNOSIS — Z23 Encounter for immunization: Secondary | ICD-10-CM

## 2019-12-24 NOTE — Progress Notes (Signed)
   Covid-19 Vaccination Clinic  Name:  Caroline [name]    MRN: [id number] DOB: 1960-11-25  12/24/2019  [name] was observed post Covid-19 immunization for 15 minutes without incident. She was provided with Vaccine Information Sheet and instruction to access the V-Safe system.   [name] was instructed to call 911 with any severe reactions post vaccine: Marland Kitchen Difficulty breathing  . Swelling of face and throat  . A fast heartbeat  . A bad rash all over body  . Dizziness and weakness   Immunizations Administered    Name Date Dose VIS Date Route   Pfizer COVID-19 Vaccine 12/24/2019 12:53 PM 0.3 mL 10/24/2018 Intramuscular   Manufacturer: [place]   Lot: [id number]   [place]: [id number]

## 2020-01-04 ENCOUNTER — Telehealth: Payer: Self-pay | Admitting: Cardiovascular Disease

## 2020-01-04 MED ORDER — DILTIAZEM HCL ER BEADS 300 MG PO CP24: ORAL_CAPSULE | ORAL | 0 refills | Status: DC

## 2020-01-04 MED ORDER — ISOSORBIDE DINITRATE 30 MG PO TABS: ORAL_TABLET | ORAL | 0 refills | Status: DC

## 2020-01-04 NOTE — Telephone Encounter (Signed)
° ° °*  STAT* If patient is at the pharmacy, call can be transferred to refill team.   1. Which medications need to be refilled? (please list name of each medication and dose if known)   isosorbide dinitrate (ISORDIL) 30 MG tablet    diltiazem (TIAZAC) 300 MG 24 hr capsule     2. Which pharmacy/location (including street and city if local pharmacy) is medication to be sent to?[id number] - [place], [place] - [address]  3. Do they need a 30 day or 90 day supply? 90 days  Pt made an appt with Dr. Angelena Form on 02/27/20

## 2020-01-04 NOTE — Telephone Encounter (Signed)
Pt's medications were sent to pt's pharmacy as requested. Confirmation received.  

## 2020-02-01 ENCOUNTER — Other Ambulatory Visit: Payer: Self-pay | Admitting: Physician Assistant

## 2020-02-11 DIAGNOSIS — E039 Hypothyroidism, unspecified: Secondary | ICD-10-CM

## 2020-02-11 MED ORDER — LEVOTHYROXINE SODIUM 50 MCG PO TABS: ORAL_TABLET | ORAL | 0 refills | Status: DC

## 2020-02-12 ENCOUNTER — Telehealth: Payer: Self-pay | Admitting: Orthopaedic Surgery

## 2020-02-12 ENCOUNTER — Other Ambulatory Visit: Payer: Self-pay

## 2020-02-12 MED ORDER — DICLOFENAC SODIUM 75 MG PO TBEC
75.0000 mg | DELAYED_RELEASE_TABLET | Freq: Two times a day (BID) | ORAL | 0 refills | Status: DC | PRN
Start: 2020-02-12 — End: 2020-09-05

## 2020-02-12 NOTE — Telephone Encounter (Signed)
Patient called.   She is requesting she be given some diclofenac. Caroline [name] she wants to come in but her son is having surgery.   Call back: [phone]

## 2020-02-12 NOTE — Telephone Encounter (Signed)
Pt aware this was [facility]

## 2020-02-12 NOTE — Telephone Encounter (Signed)
Ok to fill some for her?

## 2020-02-12 NOTE — Telephone Encounter (Signed)
yes

## 2020-02-14 ENCOUNTER — Telehealth: Payer: Self-pay | Admitting: Orthopaedic Surgery

## 2020-02-14 ENCOUNTER — Other Ambulatory Visit: Payer: Self-pay

## 2020-02-14 MED ORDER — DICLOFENAC SODIUM 1 % EX GEL: 2.0000 g | Freq: Four times a day (QID) | CUTANEOUS | 2 refills | Status: DC

## 2020-02-14 NOTE — Telephone Encounter (Signed)
Sent to her pharmacy. 

## 2020-02-14 NOTE — Telephone Encounter (Signed)
Patient called.   She wanted the topical diclofenac in place of the pills   Call back: [phone]

## 2020-02-18 ENCOUNTER — Emergency Department
Admission: EM | Admit: 2020-02-18 | Discharge: 2020-02-19 | Disposition: A | Discharge status: Home / Self Care | Payer: BC Managed Care – PPO | Attending: Emergency Medicine | Admitting: Emergency Medicine

## 2020-02-18 ENCOUNTER — Other Ambulatory Visit: Payer: Self-pay

## 2020-02-18 ENCOUNTER — Encounter: Payer: Self-pay | Admitting: Emergency Medicine

## 2020-02-18 ENCOUNTER — Telehealth: Payer: Self-pay

## 2020-02-18 DIAGNOSIS — R1013 Epigastric pain: Secondary | ICD-10-CM

## 2020-02-18 DIAGNOSIS — R160 Hepatomegaly, not elsewhere classified: Secondary | ICD-10-CM | POA: Diagnosis not present

## 2020-02-18 DIAGNOSIS — Z7982 Long term (current) use of aspirin: Secondary | ICD-10-CM | POA: Insufficient documentation

## 2020-02-18 DIAGNOSIS — N83201 Unspecified ovarian cyst, right side: Secondary | ICD-10-CM

## 2020-02-18 DIAGNOSIS — Z79899 Other long term (current) drug therapy: Secondary | ICD-10-CM | POA: Diagnosis not present

## 2020-02-18 DIAGNOSIS — K30 Functional dyspepsia: Secondary | ICD-10-CM | POA: Insufficient documentation

## 2020-02-18 DIAGNOSIS — Z87891 Personal history of nicotine dependence: Secondary | ICD-10-CM | POA: Diagnosis not present

## 2020-02-18 DIAGNOSIS — E039 Hypothyroidism, unspecified: Secondary | ICD-10-CM | POA: Insufficient documentation

## 2020-02-18 DIAGNOSIS — I2510 Atherosclerotic heart disease of native coronary artery without angina pectoris: Secondary | ICD-10-CM | POA: Diagnosis not present

## 2020-02-18 DIAGNOSIS — I10 Essential (primary) hypertension: Secondary | ICD-10-CM | POA: Diagnosis not present

## 2020-02-18 LAB — CBC
HCT: 44.8 % (ref 36.0–46.0)
Hemoglobin: 15.1 g/dL — ABNORMAL HIGH (ref 12.0–15.0)
MCH: 32.4 pg (ref 26.0–34.0)
MCHC: 33.7 g/dL (ref 30.0–36.0)
MCV: 96.1 fL (ref 80.0–100.0)
Platelets: 225 10*3/uL (ref 150–400)
RBC: 4.66 MIL/uL (ref 3.87–5.11)
RDW: 13.4 % (ref 11.5–15.5)
WBC: 14.6 10*3/uL — ABNORMAL HIGH (ref 4.0–10.5)
nRBC: 0 % (ref 0.0–0.2)

## 2020-02-18 LAB — COMPREHENSIVE METABOLIC PANEL
ALT: 29 U/L (ref 0–44)
AST: 31 U/L (ref 15–41)
Albumin: 4.5 g/dL (ref 3.5–5.0)
Alkaline Phosphatase: 82 U/L (ref 38–126)
Anion gap: 12 (ref 5–15)
BUN: 13 mg/dL (ref 6–20)
CO2: 27 mmol/L (ref 22–32)
Calcium: 9.6 mg/dL (ref 8.9–10.3)
Chloride: 98 mmol/L (ref 98–111)
Creatinine, Ser: 0.75 mg/dL (ref 0.44–1.00)
GFR calc Af Amer: >60 mL/min (ref >60)
GFR calc non Af Amer: >60 mL/min (ref >60)
Glucose, Bld: 129 mg/dL — ABNORMAL HIGH (ref 70–99)
Potassium: 3.6 mmol/L (ref 3.5–5.1)
Sodium: 137 mmol/L (ref 135–145)
Total Bilirubin: 0.8 mg/dL (ref 0.3–1.2)
Total Protein: 8.2 g/dL — ABNORMAL HIGH (ref 6.5–8.1)

## 2020-02-18 LAB — LIPASE, BLOOD: Lipase: 32 U/L (ref 11–51)

## 2020-02-18 LAB — TROPONIN I (HIGH SENSITIVITY): Troponin I (High Sensitivity): 6 ng/L (ref <18)

## 2020-02-18 MED ORDER — SODIUM CHLORIDE 0.9% FLUSH: 3.0000 mL | Freq: Once | INTRAVENOUS | Status: DC

## 2020-02-18 MED ORDER — MORPHINE SULFATE (PF) 4 MG/ML IV SOLN
4.0000 mg | Freq: Once | INTRAVENOUS | Status: AC
  Administered 2020-02-19: 4 mg via INTRAVENOUS
  Filled 2020-02-18: qty 1

## 2020-02-18 MED ORDER — ONDANSETRON HCL 4 MG/2ML IJ SOLN
4.0000 mg | Freq: Once | INTRAMUSCULAR | Status: AC
  Administered 2020-02-19: 4 mg via INTRAVENOUS
  Filled 2020-02-18: qty 2

## 2020-02-18 NOTE — Telephone Encounter (Signed)
I spoke with pt; starting on 02/16/20 pt felt like needed to burp, H/A in back of head continuous at pain level of 6, extreme fatigue, and if moves around gets clammy; not feeling any better at all. No fever or vomiting. Pt has mid chest pain or tenderness with pressure. Makes herself burp but does not really help the tenderness. Pain radiates to left shoulder. Pt is not having any chest discomfort now. pt has SOB when walks or goes up stairs (new symptom). Pt does not feel like in distress and does not want to go to ED but will go to [facility] in [place].FYI to Gentry Fitz NP.

## 2020-02-18 NOTE — ED Triage Notes (Signed)
Pt to ER states started with indigestion on Saturday and it got worse on Sunday with nausea and headache.  Pt states today epigastric pain that radiates into back.  Pt denies vomiting or SHOB.  Pt reports hx of MI.

## 2020-02-18 NOTE — Telephone Encounter (Signed)
Noted. Will review notes from UC visit.

## 2020-02-19 ENCOUNTER — Emergency Department: Payer: BC Managed Care – PPO

## 2020-02-19 ENCOUNTER — Encounter: Payer: Self-pay | Admitting: Radiology

## 2020-02-19 DIAGNOSIS — R160 Hepatomegaly, not elsewhere classified: Secondary | ICD-10-CM | POA: Diagnosis not present

## 2020-02-19 LAB — URINALYSIS, COMPLETE (UACMP) WITH MICROSCOPIC
Bacteria, UA: NONE SEEN
Bilirubin Urine: NEGATIVE
Glucose, UA: NEGATIVE mg/dL
Ketones, ur: NEGATIVE mg/dL
Nitrite: NEGATIVE
Protein, ur: NEGATIVE mg/dL
Specific Gravity, Urine: 1.019 (ref 1.005–1.030)
pH: 5 (ref 5.0–8.0)

## 2020-02-19 LAB — TROPONIN I (HIGH SENSITIVITY): Troponin I (High Sensitivity): 4 ng/L (ref <18)

## 2020-02-19 MED ORDER — PANTOPRAZOLE SODIUM 40 MG PO TBEC
40.0000 mg | DELAYED_RELEASE_TABLET | Freq: Every day | ORAL | 1 refills | Status: DC
Start: 2020-02-19 — End: 2020-09-05

## 2020-02-19 MED ORDER — ALUM & MAG HYDROXIDE-SIMETH 400-400-40 MG/5ML PO SUSP
5.0000 mL | Freq: Four times a day (QID) | ORAL | 0 refills | Status: DC | PRN
Start: 2020-02-19 — End: 2020-09-10

## 2020-02-19 MED ORDER — ONDANSETRON 4 MG PO TBDP: 4.0000 mg | ORAL_TABLET | Freq: Three times a day (TID) | ORAL | 0 refills | Status: DC | PRN

## 2020-02-19 MED ORDER — FAMOTIDINE IN NACL 20-0.9 MG/50ML-% IV SOLN
20.0000 mg | Freq: Once | INTRAVENOUS | Status: AC
  Administered 2020-02-19: 20 mg via INTRAVENOUS
  Filled 2020-02-19: qty 50

## 2020-02-19 MED ORDER — IOHEXOL 300 MG/ML  SOLN
100.0000 mL | Freq: Once | INTRAMUSCULAR | Status: AC | PRN
  Administered 2020-02-19: 100 mL via INTRAVENOUS

## 2020-02-19 NOTE — Discharge Instructions (Addendum)
You have been seen in the Emergency Department (ED) for abdominal pain.  Your evaluation did not identify a clear cause of your symptoms but was generally reassuring.  Abdominal pain has many possible causes. Some aren't serious and get better on their own in a few days. Others need more testing and treatment. If your pain continues or gets worse, you need to be rechecked and may need more tests to find out what is wrong. You may need surgery to correct the problem.   Follow up with your doctor in 12-24 hours if you are still having abdominal pain. Otherwise follow up in 1-3 days for a re-check  Take protonix once a day everyday to prevent exacerbation of your indigestion.  Take Maalox as needed for pain.  Take Zofran as needed for nausea.  Eat a bland diet for the next 48 hours.  Drink plenty of fluids.  Avoid alcohol, NSAIDs, and spicy food.  Don't ignore new symptoms, such as fever, nausea and vomiting, new or worsening abdominal pain, urination problems, bloody diarrhea or bloody stools, black tarry stools, uncontrollable nausea and vomiting, and dizziness. These may be signs of a more serious problem. If you develop any of these you should be seen by your doctor immediately or return to the ED.   How can you care for yourself at home?  Rest until you feel better.  To prevent dehydration, drink plenty of fluids, enough so that your urine is light yellow or clear like water. Choose water and other caffeine-free clear liquids until you feel better. If you have kidney, heart, or liver disease and have to limit fluids, talk with your doctor before you increase the amount of fluids you drink.  If your stomach is upset, eat mild foods, such as rice, dry toast or crackers, bananas, and applesauce. Try eating several small meals instead of two or three large ones.  Wait until 48 hours after all symptoms have gone away before you have spicy foods, alcohol, and drinks that contain caffeine.  Do not eat  foods that are high in fat.  Avoid anti-inflammatory medicines such as aspirin, ibuprofen (Advil, Motrin), and naproxen (Aleve). These can cause stomach upset. Talk to your doctor if you take daily aspirin for another health problem.  When should you call for help?  Call 911 anytime you think you may need emergency care. For example, call if:  You passed out (lost consciousness).  You pass maroon or very bloody stools.  You vomit blood or what looks like coffee grounds.  You have new, severe belly pain.  Call your doctor now or seek immediate medical care if:  Your pain gets worse, especially if it becomes focused in one area of your belly.  You have a new or higher fever.  Your stools are black and look like tar, or they have streaks of blood.  You have unexpected vaginal bleeding.  You have symptoms of a urinary tract infection. These may include:  Pain when you urinate.  Urinating more often than usual.  Blood in your urine. You are dizzy or lightheaded, or you feel like you may faint. Watch closely for changes in your health, and be sure to contact your doctor if:  You are not getting better after 1 day (24 hours).

## 2020-02-19 NOTE — ED Provider Notes (Signed)
[facility] Emergency Department Provider Note  ____________________________________________  Time seen: Approximately 12:50 AM  I have reviewed the triage vital signs and the nursing notes.   HISTORY  Chief Complaint Abdominal Pain   HPI Caroline [name] is a 59 y.o. female with a history of CAD, coronary vasospasm, GERD, hypertension, hyperlipidemia, IBS, cholecystectomy, appendectomy who presents for evaluation of abdominal pain.  Patient reports 3 days of epigastric abdominal pain.  Initially mild and intermittent which has become more severe today.  She describes the pain as a dull ache located in the epigastric region now radiating to her back between her shoulder blades.  Pain is severe.  No nausea, vomiting, diarrhea, constipation, chest pain, shortness of breath, fever or chills.  She is passing flatus and had 2 normal bowel movements today.  She denies NSAID use or alcohol use.   Past Medical History:  Diagnosis Date  . CAD (coronary artery disease)    has documented coronary vasospasm but with underlying 40% prox LAD, 30% prox LCX and 70 to 80% proximal 1st DX and 30% RCA; recurrent vasospasm with subsequent cath in Jan 2013.   Marland Kitchen Coronary vasospasm ([place])   . Environmental and seasonal allergies   . Esophagitis   . GERD (gastroesophageal reflux disease)   . Hyperlipidemia   . Hypertension   . IBS (irritable bowel syndrome)   . Palpitations    tachycardia on holter; treated with beta blockers    Patient Active Problem List   Diagnosis Date Noted  . Anxiety 04/26/2019  . Hypothyroidism 03/15/2019  . Environmental and seasonal allergies 06/09/2018  . Preventative health care 06/09/2018  . Prediabetes 06/09/2018  . Recurrent cough 09/26/2017  . Hypertension   . Lateral epicondylitis of left elbow 10/07/2016  . Carpal tunnel syndrome, right upper limb 10/07/2016  . Intrinsic asthma 08/06/2014  . Dyslipidemia 02/13/2014  . Closed fracture of  right humerus with routine healing 02/06/2013  . IBS (irritable bowel syndrome) 09/15/2011  . Nonspecific elevation of levels of transaminase or lactic acid dehydrogenase (LDH) 09/15/2011  . Coronary vasospasm ([place])   . Tachycardia 07/28/2011  . History of esophagitis 07/28/2011  . CAD (coronary artery disease) 05/28/2011    Past Surgical History:  Procedure Laterality Date  . APPENDECTOMY  1976  . BREAST EXCISIONAL BIOPSY Right    benign  . BREAST SURGERY  2002   biopsy  . CARDIAC CATHETERIZATION  2011  . CARDIAC CATHETERIZATION  Sept 2012   With documented vasospasm but with residual disease; managed medically  . CHOLECYSTECTOMY  1982  . LEFT HEART CATHETERIZATION WITH CORONARY ANGIOGRAM N/A 09/14/2011   Procedure: LEFT HEART CATHETERIZATION WITH CORONARY ANGIOGRAM;  Surgeon: Burnell Blanks, MD;  Location: [facility] CATH LAB;  Service: Cardiovascular;  Laterality: N/A;  . UPPER GASTROINTESTINAL ENDOSCOPY  01/11/2011   esophagitis    Prior to Admission medications   Medication Sig Start Date End Date Taking? Authorizing Provider  acetaminophen (TYLENOL) 500 MG tablet Take 500 mg by mouth every 6 (six) hours as needed. For pain    [provider]  alum & mag hydroxide-simeth (MAALOX MAX) 400-400-40 MG/5ML suspension Take 5 mLs by mouth every 6 (six) hours as needed for indigestion. 02/19/20   Rudene Re, MD  aspirin 81 MG tablet Take 81 mg by mouth daily.      [provider]  atorvastatin (LIPITOR) 20 MG tablet Take 1 tablet (20 mg total) by mouth daily. Please keep upcoming appt for future refills. 02/01/20  Burnell Blanks, MD  Cholecalciferol (VITAMIN D) 2000 units CAPS Take 2000 units once per day. 03/18/16   Burchette, Alinda Sierras, MD  diclofenac (VOLTAREN) 75 MG EC tablet Take 1 tablet (75 mg total) by mouth 2 (two) times daily as needed. 02/12/20   Pete Pelt, PA-C  diclofenac Sodium (VOLTAREN) 1 % GEL Apply 2-4 g topically 4 (four) times  daily. 02/14/20   Hilts, Legrand Como, MD  diltiazem (TIAZAC) 300 MG 24 hr capsule TAKE 1 CAPSULE BY MOUTH EVERY DAY. Please keep upcoming appt in June with Dr. Angelena Form before anymore refills. Thank you 01/04/20   Burnell Blanks, MD  fluticasone (FLONASE) 50 MCG/ACT nasal spray Place 2 sprays into both nostrils daily. 04/07/18   Elby Beck, FNP  isosorbide dinitrate (ISORDIL) 30 MG tablet TAKE 1 TABLET BY MOUTH 3 TIMES DAILY. Please keep upcoming appt in June with Dr. Angelena Form before anymore refills. Thank you Final 01/04/20   Burnell Blanks, MD  levocetirizine (XYZAL) 5 MG tablet Take 1 tablet (5 mg total) by mouth at bedtime as needed for allergies. 04/26/19   Pleas Koch, NP  levothyroxine (SYNTHROID) 50 MCG tablet TAKE 1 TABLET BY MOUTH EVERY MORNING ON AN EMPTY STOMACH. NO FOOD OR OTHER MEDICATIONS FOR 30 MINUTES. 02/11/20   Pleas Koch, NP  nitroGLYCERIN (NITROSTAT) 0.4 MG SL tablet Place 1 tablet (0.4 mg total) under the tongue every 5 (five) minutes as needed for chest pain. X 3 doses 09/16/17   Burnell Blanks, MD  ondansetron (ZOFRAN ODT) 4 MG disintegrating tablet Take 1 tablet (4 mg total) by mouth every 8 (eight) hours as needed. 02/19/20   Rudene Re, MD  pantoprazole (PROTONIX) 40 MG tablet Take 1 tablet (40 mg total) by mouth daily. 02/19/20 02/18/21  Rudene Re, MD  sertraline (ZOLOFT) 50 MG tablet Take 1 tablet (50 mg total) by mouth at bedtime. 04/26/19   Pleas Koch, NP  VENTOLIN HFA 108 (90 Base) MCG/ACT inhaler INHALE 2 PUFFS BY MOUTH EVERY 6 HOURS AS NEEDED 12/04/19   Pleas Koch, NP    Allergies Patient has no known allergies.  Family History  Problem Relation Age of Onset  . Breast cancer Mother 95  . Heart disease Mother        died CHF 69  . Diabetes Mother   . Prostate cancer Father   . Hyperlipidemia Father   . Heart disease Father        s/p cabg - alive  . Colon cancer Other        First cousin on dads  side   . Thyroid disease Maternal Grandmother     Social History Social History   Tobacco Use  . Smoking status: Former Smoker    Packs/day: 0.25    Years: 30.00    Pack years: 7.50    Types: Cigarettes    Quit date: 05/30/2014    Years since quitting: 5.7  . Smokeless tobacco: Never Used  Vaping Use  . Vaping Use: Never used  Substance Use Topics  . Alcohol use: Yes    Alcohol/week: 2.0 standard drinks    Types: 2 Standard drinks or equivalent per week    Comment: occ  . Drug use: No    Review of Systems  Constitutional: Negative for fever. Eyes: Negative for visual changes. ENT: Negative for sore throat. Neck: No neck pain  Cardiovascular: Negative for chest pain. Respiratory: Negative for shortness of breath. Gastrointestinal: + epigastric abdominal  pain. No vomiting or diarrhea. Genitourinary: Negative for dysuria. Musculoskeletal: Negative for back pain. Skin: Negative for rash. Neurological: Negative for headaches, weakness or numbness. Psych: No SI or HI ____________________________________________   PHYSICAL EXAM:  VITAL SIGNS: ED Triage Vitals [02/18/20 1937]  Enc Vitals Group     BP 134/81     Pulse Rate 79     Resp 18     Temp 98.8 F (37.1 C)     Temp Source Oral     SpO2 96 %     Weight 168 lb (76.2 kg)     Height 5\' 4"  (1.626 m)     Head Circumference      Peak Flow      Pain Score 6     Pain Loc      Pain Edu?      Excl. in [place]?     Constitutional: Alert and oriented. Well appearing and in no apparent distress. HEENT:      Head: Normocephalic and atraumatic.         Eyes: Conjunctivae are normal. Sclera is non-icteric.       Mouth/Throat: Mucous membranes are moist.       Neck: Supple with no signs of meningismus. Cardiovascular: Regular rate and rhythm. No murmurs, gallops, or rubs. 2+ symmetrical distal pulses are present in all extremities. No JVD. Respiratory: Normal respiratory effort. Lungs are clear to auscultation  bilaterally. No wheezes, crackles, or rhonchi.  Gastrointestinal: Soft, tender to palpation in the right upper quadrant and epigastric region, and non distended with positive bowel sounds. No rebound or guarding. Genitourinary: No CVA tenderness. Musculoskeletal:  No edema, cyanosis, or erythema of extremities. Neurologic: Normal speech and language. Face is symmetric. Moving all extremities. No gross focal neurologic deficits are appreciated. Skin: Skin is warm, dry and intact. No rash noted. Psychiatric: Mood and affect are normal. Speech and behavior are normal.   ____________________________________________   LABS (all labs ordered are listed, but only abnormal results are displayed)  Labs Reviewed  COMPREHENSIVE METABOLIC PANEL - Abnormal; Notable for the following components:      Result Value   Glucose, Bld 129 (*)    Total Protein 8.2 (*)    All other components within normal limits  CBC - Abnormal; Notable for the following components:   WBC 14.6 (*)    Hemoglobin 15.1 (*)    All other components within normal limits  URINALYSIS, COMPLETE (UACMP) WITH MICROSCOPIC - Abnormal; Notable for the following components:   Color, Urine YELLOW (*)    APPearance HAZY (*)    Hgb urine dipstick SMALL (*)    Leukocytes,Ua TRACE (*)    All other components within normal limits  LIPASE, BLOOD  POC URINE PREG, ED  TROPONIN I (HIGH SENSITIVITY)  TROPONIN I (HIGH SENSITIVITY)   ____________________________________________  EKG  ED ECG REPORT I, Rudene Re, the attending physician, personally viewed and interpreted this ECG.   19:39 -normal sinus rhythm, rate of 81, normal intervals, normal axis, no ST elevations or depressions, anterior Q waves.  No significant changes when compared to prior.  23:56 -normal sinus rhythm, rate of 74, normal intervals, normal axis, no ST elevations or depressions.  Normal EKG.  ____________________________________________  RADIOLOGY  I  have personally reviewed the images performed during this visit and I agree with the Radiologist's read.   Interpretation by Radiologist:  CT ABDOMEN PELVIS W CONTRAST  Result Date: 02/19/2020 CLINICAL DATA:  Abdominal pain. Patient reports epigastric pain radiating  to the back. Nausea. History of cholecystectomy and appendectomy. EXAM: CT ABDOMEN AND PELVIS WITH CONTRAST TECHNIQUE: Multidetector CT imaging of the abdomen and pelvis was performed using the standard protocol following bolus administration of intravenous contrast. CONTRAST:  181mL OMNIPAQUE IOHEXOL 300 MG/ML  SOLN COMPARISON:  Remote CT 12/09/2010 FINDINGS: Lower chest: Minor atelectasis at the right lung base. Hepatobiliary: Enlarged liver spanning 20 cm cranial caudal. Diffusely decreased hepatic density consistent with steatosis. Clips in the gallbladder fossa postcholecystectomy. No biliary dilatation. Pancreas: No ductal dilatation or inflammation. Spleen: Normal in size without focal abnormality. Adrenals/Urinary Tract: Mild left adrenal thickening without dominant nodule. Normal right adrenal gland. No hydronephrosis or perinephric edema. Homogeneous renal enhancement with symmetric excretion on delayed phase imaging. Two small cysts in the left kidney. Urinary bladder is partially distended without wall thickening. Stomach/Bowel: Nondistended stomach. Normal positioning of the duodenum and ligament of [place]itz. There is no small bowel obstruction or inflammatory change. Prior appendectomy. Small volume of colonic stool. No colonic wall thickening or inflammation. Vascular/Lymphatic: Aorto bi-iliac atherosclerosis. No aortic aneurysm. The portal vein is patent. Few prominent periportal lymph nodes are likely reactive, largest measuring 9 mm short axis. Reproductive: Homogeneous water density right adnexal lesion measuring 5.8 cm is most consistent with cyst. The uterus and left ovary appear normal. Other: No free air, free fluid, or  intra-abdominal fluid collection. Musculoskeletal: There are no acute or suspicious osseous abnormalities. IMPRESSION: 1. No acute abnormality in the abdomen/pelvis. 2. Hepatomegaly and hepatic steatosis. 3. Right adnexal cyst measuring 5.8 cm. Given postmenopausal status, recommend nonemergent pelvic ultrasound for characterization. Aortic Atherosclerosis (ICD10-I70.0). Electronically Signed   By: Keith Rake M.D.   On: 02/19/2020 01:58     ____________________________________________   PROCEDURES  Procedure(s) performed:yes .1-3 Lead EKG Interpretation Performed by: Rudene Re, MD Authorized by: Rudene Re, MD     Interpretation: normal     ECG rate assessment: normal     Rhythm: sinus rhythm     Ectopy: none     Conduction: normal     Critical Care performed:  None ____________________________________________   INITIAL IMPRESSION / ASSESSMENT AND PLAN / ED COURSE  59 y.o. female with a history of CAD, coronary vasospasm, GERD, hypertension, hyperlipidemia, IBS, cholecystectomy, appendectomy who presents for evaluation of dull epigastric abdominal pain x 4 days.  Patient looks uncomfortable due to pain but in no obvious distress, her vitals are within normal limits, abdomen is soft with epigastric and right upper quadrant tenderness with no rebound or guarding.  EKG x2 with no acute ischemic changes.  Labs showing leukocytosis with WBC 14.6K, normal LFTs, normal lipase, normal electrolytes, high-sensitivity troponin x2 -.  UA negative for UTI.  CT a/p pending.  Ddx indigestion, gerd, retained common bile duct stone, hepatitis, pancreatitis, SBO, gastritis, peptic ulcer disease.  Patient given IV morphine and Zofran for symptom relief.    _________________________ 3:04 AM on 02/19/2020 -----------------------------------------  CT negative for acute findings other than hepatomegaly, confirmed by radiology.  Discussed with the patient incidental finding  of a right adnexal cyst and recommended outpatient follow-up.  Pain is resolved.  Will discharge home on Protonix, Maalox, Zofran, and referral to GI.  Discussed my standard return precautions.  Discussed bland diet, increase oral hydration, and foods to be avoided.  Plan discussed with patient and her son was at bedside. _____________________________________________ Please note:  Patient was evaluated in Emergency Department today for the symptoms described in the history of present illness. Patient was evaluated in the context  of the global COVID-19 pandemic, which necessitated consideration that the patient might be at risk for infection with the SARS-CoV-2 virus that causes COVID-19. Institutional protocols and algorithms that pertain to the evaluation of patients at risk for COVID-19 are in a state of rapid change based on information released by regulatory bodies including the CDC and federal and state organizations. These policies and algorithms were followed during the patient's care in the ED.  Some ED evaluations and interventions may be delayed as a result of limited staffing during the pandemic.   [place] Controlled Substance Database was reviewed by me. ____________________________________________   FINAL CLINICAL IMPRESSION(S) / ED DIAGNOSES   Final diagnoses:  Indigestion  Epigastric pain      NEW MEDICATIONS STARTED DURING THIS VISIT:  ED Discharge Orders         Ordered    pantoprazole (PROTONIX) 40 MG tablet  Daily     Discontinue  Reprint     02/19/20 0223    ondansetron (ZOFRAN ODT) 4 MG disintegrating tablet  Every 8 hours PRN     Discontinue  Reprint     02/19/20 0223    alum & mag hydroxide-simeth (MAALOX MAX) 431-540-08 MG/5ML suspension  Every 6 hours PRN     Discontinue  Reprint     02/19/20 0223           Note:  This document was prepared using Dragon voice recognition software and may include unintentional dictation errors.    Alfred Levins, [place],  MD 02/19/20 [phone]

## 2020-02-21 ENCOUNTER — Ambulatory Visit: Payer: BC Managed Care – PPO | Admitting: Primary Care

## 2020-02-21 ENCOUNTER — Other Ambulatory Visit: Payer: Self-pay

## 2020-02-21 VITALS — BP 122/72 | HR 76 | Temp 96.1°F | Ht 64.0 in | Wt 177.8 lb

## 2020-02-21 DIAGNOSIS — Q6102 Congenital multiple renal cysts: Secondary | ICD-10-CM | POA: Diagnosis not present

## 2020-02-21 DIAGNOSIS — D72829 Elevated white blood cell count, unspecified: Secondary | ICD-10-CM | POA: Diagnosis not present

## 2020-02-21 DIAGNOSIS — N9489 Other specified conditions associated with female genital organs and menstrual cycle: Secondary | ICD-10-CM | POA: Diagnosis not present

## 2020-02-21 DIAGNOSIS — R1013 Epigastric pain: Secondary | ICD-10-CM

## 2020-02-21 HISTORY — DX: Other specified conditions associated with female genital organs and menstrual cycle: N94.89

## 2020-02-21 LAB — CBC WITH DIFFERENTIAL/PLATELET
Basophils Absolute: 0 10*3/uL (ref 0.0–0.1)
Basophils Relative: 0.4 % (ref 0.0–3.0)
Eosinophils Absolute: 0.4 10*3/uL (ref 0.0–0.7)
Eosinophils Relative: 5.2 % — ABNORMAL HIGH (ref 0.0–5.0)
HCT: 42 % (ref 36.0–46.0)
Hemoglobin: 14.2 g/dL (ref 12.0–15.0)
Lymphocytes Relative: 20.5 % (ref 12.0–46.0)
Lymphs Abs: 1.5 10*3/uL (ref 0.7–4.0)
MCHC: 33.8 g/dL (ref 30.0–36.0)
MCV: 97 fl (ref 78.0–100.0)
Monocytes Absolute: 0.7 10*3/uL (ref 0.1–1.0)
Monocytes Relative: 9.1 % (ref 3.0–12.0)
Neutro Abs: 4.9 10*3/uL (ref 1.4–7.7)
Neutrophils Relative %: 64.8 % (ref 43.0–77.0)
Platelets: 218 10*3/uL (ref 150.0–400.0)
RBC: 4.33 Mil/uL (ref 3.87–5.11)
RDW: 13.6 % (ref 11.5–15.5)
WBC: 7.5 10*3/uL (ref 4.0–10.5)

## 2020-02-21 NOTE — Assessment & Plan Note (Signed)
Incidental finding on CT scan from two days ago. Renal ultrasound pending.

## 2020-02-21 NOTE — Assessment & Plan Note (Signed)
Measuring 5.8 cm to right side.  Transvaginal and pelvic ultrasound pending. No vaginal symptoms.

## 2020-02-21 NOTE — Patient Instructions (Signed)
You will be contacted regarding your ultrasounds of the kidney and pelvis.  Please let us know if you have not been contacted by Monday afternoon next week.  Continue pantoprazole 40 mg once daily. Continue Maalox if needed. Please update me in a week.  Stop by the lab prior to leaving today. I will notify you of your results once received.   It was a pleasure to see you today!   Food Choices for Gastroesophageal Reflux Disease, Adult When you have gastroesophageal reflux disease (GERD), the foods you eat and your eating habits are very important. Choosing the right foods can help ease your discomfort. Think about working with a nutrition specialist (dietitian) to help you make good choices. What are tips for following this plan?  Meals  Choose healthy foods that are low in fat, such as fruits, vegetables, whole grains, low-fat dairy products, and lean meat, fish, and poultry.  Eat small meals often instead of 3 large meals a day. Eat your meals slowly, and in a place where you are relaxed. Avoid bending over or lying down until 2-3 hours after eating.  Avoid eating meals 2-3 hours before bed.  Avoid drinking a lot of liquid with meals.  Cook foods using methods other than frying. Bake, grill, or broil food instead.  Avoid or limit: ? Chocolate. ? Peppermint or spearmint. ? Alcohol. ? Pepper. ? Black and decaffeinated coffee. ? Black and decaffeinated tea. ? Bubbly (carbonated) soft drinks. ? Caffeinated energy drinks and soft drinks.  Limit high-fat foods such as: ? Fatty meat or fried foods. ? Whole milk, cream, butter, or ice cream. ? Nuts and nut butters. ? Pastries, donuts, and sweets made with butter or shortening.  Avoid foods that cause symptoms. These foods may be different for everyone. Common foods that cause symptoms include: ? Tomatoes. ? Oranges, lemons, and limes. ? Peppers. ? Spicy food. ? Onions and garlic. ? Vinegar. Lifestyle  Maintain a healthy  weight. Ask your doctor what weight is healthy for you. If you need to lose weight, work with your doctor to do so safely.  Exercise for at least 30 minutes for 5 or more days each week, or as told by your doctor.  Wear loose-fitting clothes.  Do not smoke. If you need help quitting, ask your doctor.  Sleep with the head of your bed higher than your feet. Use a wedge under the mattress or blocks under the bed frame to raise the head of the bed. Summary  When you have gastroesophageal reflux disease (GERD), food and lifestyle choices are very important in easing your symptoms.  Eat small meals often instead of 3 large meals a day. Eat your meals slowly, and in a place where you are relaxed.  Limit high-fat foods such as fatty meat or fried foods.  Avoid bending over or lying down until 2-3 hours after eating.  Avoid peppermint and spearmint, caffeine, alcohol, and chocolate. This information is not intended to replace advice given to you by your health care provider. Make sure you discuss any questions you have with your health care provider. Document Revised: 12/07/2018 Document Reviewed: 09/21/2016 Elsevier Patient Education  [employer].

## 2020-02-21 NOTE — Assessment & Plan Note (Signed)
Acute on chronic, recent flare with belching. CT scan overall benign for acute pathology, labs negative except for leukocytosis.  Checking CBC with diff today. Continue pantoprazole and maalox, she will separate these from levothyroxine for four hours.  Exam today with tenderness, she appears stable. Offered GI referral for which she kindly declines. She will update in a week.

## 2020-02-21 NOTE — Progress Notes (Signed)
Subjective:    Patient ID: Caroline [name], female    DOB: [birth year], 59 y.o.   MRN: [id number]  HPI  This visit occurred during the SARS-CoV-2 public health emergency.  Safety protocols were in place, including screening questions prior to the visit, additional usage of staff PPE, and extensive cleaning of exam room while observing appropriate contact time as indicated for disinfecting solutions.   Caroline [name] is a 59 year old female with a history of CAD, coronary vasospasm, IBS, cholecystectomy, appendectomy, hypothyroidism, asthma who presents today for ED follow up.  She presented to [facility] ED on 02/19/20 with a chief complaint of abdominal pain. Her pain was located to the epigastric region with radiation between scapula bilaterally, began about four days prior.  During her stay in the ED she underwent ECG x 2, without acute ischemic changes. Labs with leukocytosis of 14.6, normal LFT's, normal lipase and electrolytes. Negative high sensitivity troponin x 2. UA was negative for acute cystitis.   She underwent CT abdomen/pelvis which was negative for an acute process except for hepatomegaly. There was an incidental finding of a right adnexal cyst which was recommended for outpatient evaluation. Also mention of two renal cysts to left kidney. She was discharged home later that evening with Rx's for pantoprazole, Maalox, Zofran. She was referred to GI for evaluation.   Since her discharge home she's feeling slightly better. She continues to feel the sensation to belch, but when she belches a slight amount of gas is relieved and she will notice epigastric pain. She was once following with GI for GERD treatment, no recent visit.   She is compliant to her pantoprazole 40 mg once daily, also taking Maalox. She denies diarrhea, vomiting. She is eating and drinking okay, is eating a more bland diet over the last several days. Prior to her recent event she admits to an unhealthy diet.    The CT scan  shows a right adnexal cyst measuring 5.8 cm, bilateral left renal cysts. She has not had a hysterectomy. She denies vaginal bleeding, pressure. History of tobacco abuse.   Review of Systems  Constitutional: Negative for fever.  Cardiovascular: Negative for chest pain.  Gastrointestinal: Positive for abdominal pain and nausea.  Genitourinary: Negative for pelvic pain, vaginal bleeding and vaginal discharge.  Musculoskeletal: Positive for back pain.       Past Medical History:  Diagnosis Date  . CAD (coronary artery disease)    has documented coronary vasospasm but with underlying 40% prox LAD, 30% prox LCX and 70 to 80% proximal 1st DX and 30% RCA; recurrent vasospasm with subsequent cath in Jan 2013.   Marland Kitchen Coronary vasospasm ([place])   . Environmental and seasonal allergies   . Esophagitis   . GERD (gastroesophageal reflux disease)   . Hyperlipidemia   . Hypertension   . IBS (irritable bowel syndrome)   . Palpitations    tachycardia on holter; treated with beta blockers     Social History   Socioeconomic History  . Marital status: Married    Spouse name: Not on file  . Number of children: 2  . Years of education: Not on file  . Highest education level: Not on file  Occupational History  . Occupation: Unemployed  Tobacco Use  . Smoking status: Former Smoker    Packs/day: 0.25    Years: 30.00    Pack years: 7.50    Types: Cigarettes    Quit date: 05/30/2014    Years since quitting: 5.7  .  Smokeless tobacco: Never Used  Vaping Use  . Vaping Use: Never used  Substance and Sexual Activity  . Alcohol use: Yes    Alcohol/week: 2.0 standard drinks    Types: 2 Standard drinks or equivalent per week    Comment: occ  . Drug use: No  . Sexual activity: Yes  Other Topics Concern  . Not on file  Social History Narrative   Married.   2 children.   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   She enjoys being with her dogs, shopping.    Social Determinants of Health   Financial  Resource Strain:   . Difficulty of Paying Living Expenses:   Food Insecurity:   . Worried About Charity fundraiser in the Last Year:   . Arboriculturist in the Last Year:   Transportation Needs:   . Film/video editor (Medical):   Marland Kitchen Lack of Transportation (Non-Medical):   Physical Activity:   . Days of Exercise per Week:   . Minutes of Exercise per Session:   Stress:   . Feeling of Stress :   Social Connections:   . Frequency of Communication with Friends and Family:   . Frequency of Social Gatherings with Friends and Family:   . Attends Religious Services:   . Active Member of Clubs or Organizations:   . Attends Archivist Meetings:   Marland Kitchen Marital Status:   Intimate Partner Violence:   . Fear of Current or Ex-Partner:   . Emotionally Abused:   Marland Kitchen Physically Abused:   . Sexually Abused:     Past Surgical History:  Procedure Laterality Date  . APPENDECTOMY  1976  . BREAST EXCISIONAL BIOPSY Right    benign  . BREAST SURGERY  2002   biopsy  . CARDIAC CATHETERIZATION  2011  . CARDIAC CATHETERIZATION  Sept 2012   With documented vasospasm but with residual disease; managed medically  . CHOLECYSTECTOMY  1982  . LEFT HEART CATHETERIZATION WITH CORONARY ANGIOGRAM N/A 09/14/2011   Procedure: LEFT HEART CATHETERIZATION WITH CORONARY ANGIOGRAM;  Surgeon: Burnell Blanks, MD;  Location: [facility] C[facility] LAB;  Service: Cardiovascular;  Laterality: N/A;  . UPPER GASTROINTESTINAL ENDOSCOPY  01/11/2011   esophagitis    Family History  Problem Relation Age of Onset  . Breast cancer Mother 68  . Heart disease Mother        died CHF 50  . Diabetes Mother   . Prostate cancer Father   . Hyperlipidemia Father   . Heart disease Father        s/p cabg - alive  . Colon cancer Other        First cousin on dads side   . Thyroid disease Maternal Grandmother     No Known Allergies  Current Outpatient Medications on File Prior to Visit  Medication Sig Dispense Refill  .  acetaminophen (TYLENOL) 500 MG tablet Take 500 mg by mouth every 6 (six) hours as needed. For pain    . alum & mag hydroxide-simeth (MAALOX MAX) 400-400-40 MG/5ML suspension Take 5 mLs by mouth every 6 (six) hours as needed for indigestion. 355 mL 0  . aspirin 81 MG tablet Take 81 mg by mouth daily.      Marland Kitchen atorvastatin (LIPITOR) 20 MG tablet Take 1 tablet (20 mg total) by mouth daily. Please keep upcoming appt for future refills. 90 tablet 0  . Cholecalciferol (VITAMIN D) 2000 units CAPS Take 2000 units once per day. 30 capsule   .  diclofenac (VOLTAREN) 75 MG EC tablet Take 1 tablet (75 mg total) by mouth 2 (two) times daily as needed. 60 tablet 0  . diclofenac Sodium (VOLTAREN) 1 % GEL Apply 2-4 g topically 4 (four) times daily. 200 g 2  . diltiazem (TIAZAC) 300 MG 24 hr capsule TAKE 1 CAPSULE BY MOUTH EVERY DAY. Please keep upcoming appt in June with Dr. Angelena Form before anymore refills. Thank you 90 capsule 0  . fluticasone (FLONASE) 50 MCG/ACT nasal spray Place 2 sprays into both nostrils daily. 16 g 6  . isosorbide dinitrate (ISORDIL) 30 MG tablet TAKE 1 TABLET BY MOUTH 3 TIMES DAILY. Please keep upcoming appt in June with Dr. Angelena Form before anymore refills. Thank you Final 270 tablet 0  . levocetirizine (XYZAL) 5 MG tablet Take 1 tablet (5 mg total) by mouth at bedtime as needed for allergies. 90 tablet 3  . levothyroxine (SYNTHROID) 50 MCG tablet TAKE 1 TABLET BY MOUTH EVERY MORNING ON AN EMPTY STOMACH. NO FOOD OR OTHER MEDICATIONS FOR 30 MINUTES. 90 tablet 0  . nitroGLYCERIN (NITROSTAT) 0.4 MG SL tablet Place 1 tablet (0.4 mg total) under the tongue every 5 (five) minutes as needed for chest pain. X 3 doses 75 tablet 1  . ondansetron (ZOFRAN ODT) 4 MG disintegrating tablet Take 1 tablet (4 mg total) by mouth every 8 (eight) hours as needed. 20 tablet 0  . pantoprazole (PROTONIX) 40 MG tablet Take 1 tablet (40 mg total) by mouth daily. 30 tablet 1  . sertraline (ZOLOFT) 50 MG tablet Take 1  tablet (50 mg total) by mouth at bedtime. 90 tablet 3  . VENTOLIN HFA 108 (90 Base) MCG/ACT inhaler INHALE 2 PUFFS BY MOUTH EVERY 6 HOURS AS NEEDED 18 g 0   No current facility-administered medications on file prior to visit.    BP 122/72   Pulse 76   Temp (!) 96.1 F (35.6 C) (Temporal)   Ht 5\' 4"  (1.626 m)   Wt 177 lb 12 oz (80.6 kg)   LMP 07/28/2010   SpO2 97%   BMI 30.51 kg/m    Objective:   Physical Exam  Cardiovascular: Normal rate and regular rhythm.  Respiratory: Effort normal and breath sounds normal.  GI: Soft. Bowel sounds are normal. There is abdominal tenderness in the epigastric area.  Musculoskeletal:     Cervical back: Neck supple.  Skin: Skin is warm and dry.           Assessment & Plan:

## 2020-02-27 ENCOUNTER — Encounter: Payer: Self-pay | Admitting: Cardiovascular Disease

## 2020-02-27 ENCOUNTER — Ambulatory Visit: Payer: BC Managed Care – PPO | Admitting: Cardiovascular Disease

## 2020-02-27 ENCOUNTER — Other Ambulatory Visit: Payer: Self-pay

## 2020-02-27 VITALS — BP 116/70 | HR 78 | Ht 64.0 in | Wt 179.2 lb

## 2020-02-27 DIAGNOSIS — E785 Hyperlipidemia, unspecified: Secondary | ICD-10-CM | POA: Diagnosis not present

## 2020-02-27 DIAGNOSIS — I2510 Atherosclerotic heart disease of native coronary artery without angina pectoris: Secondary | ICD-10-CM | POA: Diagnosis not present

## 2020-02-27 DIAGNOSIS — I201 Angina pectoris with documented spasm: Secondary | ICD-10-CM | POA: Diagnosis not present

## 2020-02-27 MED ORDER — ATORVASTATIN CALCIUM 20 MG PO TABS: 20.0000 mg | ORAL_TABLET | Freq: Every day | ORAL | 3 refills | Status: DC

## 2020-02-27 MED ORDER — ISOSORBIDE DINITRATE 30 MG PO TABS: ORAL_TABLET | ORAL | 3 refills | Status: DC

## 2020-02-27 MED ORDER — DILTIAZEM HCL ER BEADS 300 MG PO CP24: ORAL_CAPSULE | ORAL | 3 refills | Status: DC

## 2020-02-27 MED ORDER — NITROGLYCERIN 0.4 MG SL SUBL: 0.4000 mg | SUBLINGUAL_TABLET | SUBLINGUAL | 3 refills | Status: DC | PRN

## 2020-02-27 NOTE — Patient Instructions (Signed)

## 2020-02-27 NOTE — Progress Notes (Signed)
Chief Complaint  Patient presents with  . Follow-up    CAD   History of Present Illness: 59 yo female with history of CAD, Prinzmetal angina, HLD, IBS, GERD who is here today for cardiac follow up. She had been followed by Dr. Mare Ferrari prior to his retirement. She has Prinzmetal angina controlled with Diltiazem, Isordil and Ranexa. Last cardiac cath in January 2013 at which time she was noted to have spasm in the ostial LAD with mild calcification, ostial Diagonal 50% stenosis, 20% mid Circumflex stenosis, 30% OM stenosis, 30% mid RCA stenosis. LVEF 55% by cath. ABI normal September 2017. Nuclear stress test in February 2018 with no ischemia. She was seen in the [facility] ED 02/19/20 with epigastric pain. Troponin negative. EKG without ischemic changes.   She is here today for follow up. The patient denies any chest pain, dyspnea, palpitations, lower extremity edema, orthopnea, PND, dizziness, near syncope or syncope. She is feeling great.     Primary Care Physician: Pleas Koch, NP  Past Medical History:  Diagnosis Date  . CAD (coronary artery disease)    has documented coronary vasospasm but with underlying 40% prox LAD, 30% prox LCX and 70 to 80% proximal 1st DX and 30% RCA; recurrent vasospasm with subsequent cath in Jan 2013.   Marland Kitchen Coronary vasospasm ([place])   . Environmental and seasonal allergies   . Esophagitis   . GERD (gastroesophageal reflux disease)   . Hyperlipidemia   . Hypertension   . IBS (irritable bowel syndrome)   . Palpitations    tachycardia on holter; treated with beta blockers    Past Surgical History:  Procedure Laterality Date  . APPENDECTOMY  1976  . BREAST EXCISIONAL BIOPSY Right    benign  . BREAST SURGERY  2002   biopsy  . CARDIAC CATHETERIZATION  2011  . CARDIAC CATHETERIZATION  Sept 2012   With documented vasospasm but with residual disease; managed medically  . CHOLECYSTECTOMY  1982  . LEFT HEART CATHETERIZATION WITH CORONARY ANGIOGRAM N/A  09/14/2011   Procedure: LEFT HEART CATHETERIZATION WITH CORONARY ANGIOGRAM;  Surgeon: Burnell Blanks, MD;  Location: [facility] CATH LAB;  Service: Cardiovascular;  Laterality: N/A;  . UPPER GASTROINTESTINAL ENDOSCOPY  01/11/2011   esophagitis    Current Outpatient Medications  Medication Sig Dispense Refill  . acetaminophen (TYLENOL) 500 MG tablet Take 500 mg by mouth every 6 (six) hours as needed. For pain    . alum & mag hydroxide-simeth (MAALOX MAX) 400-400-40 MG/5ML suspension Take 5 mLs by mouth every 6 (six) hours as needed for indigestion. 355 mL 0  . aspirin 81 MG tablet Take 81 mg by mouth daily.      Marland Kitchen atorvastatin (LIPITOR) 20 MG tablet Take 1 tablet (20 mg total) by mouth daily. 90 tablet 3  . Cholecalciferol (VITAMIN D) 2000 units CAPS Take 2000 units once per day. 30 capsule   . diclofenac (VOLTAREN) 75 MG EC tablet Take 1 tablet (75 mg total) by mouth 2 (two) times daily as needed. 60 tablet 0  . diclofenac Sodium (VOLTAREN) 1 % GEL Apply 2-4 g topically 4 (four) times daily. 200 g 2  . diltiazem (TIAZAC) 300 MG 24 hr capsule TAKE 1 CAPSULE BY MOUTH EVERY DAY 90 capsule 3  . fluticasone (FLONASE) 50 MCG/ACT nasal spray Place 2 sprays into both nostrils daily. 16 g 6  . isosorbide dinitrate (ISORDIL) 30 MG tablet TAKE 1 TABLET BY MOUTH 3 TIMES DAILY. 270 tablet 3  . levocetirizine (  XYZAL) 5 MG tablet Take 1 tablet (5 mg total) by mouth at bedtime as needed for allergies. 90 tablet 3  . levothyroxine (SYNTHROID) 50 MCG tablet TAKE 1 TABLET BY MOUTH EVERY MORNING ON AN EMPTY STOMACH. NO FOOD OR OTHER MEDICATIONS FOR 30 MINUTES. 90 tablet 0  . nitroGLYCERIN (NITROSTAT) 0.4 MG SL tablet Place 1 tablet (0.4 mg total) under the tongue every 5 (five) minutes as needed for chest pain. X 3 doses 25 tablet 3  . ondansetron (ZOFRAN ODT) 4 MG disintegrating tablet Take 1 tablet (4 mg total) by mouth every 8 (eight) hours as needed. 20 tablet 0  . pantoprazole (PROTONIX) 40 MG tablet Take 1  tablet (40 mg total) by mouth daily. 30 tablet 1  . sertraline (ZOLOFT) 50 MG tablet Take 1 tablet (50 mg total) by mouth at bedtime. 90 tablet 3  . VENTOLIN HFA 108 (90 Base) MCG/ACT inhaler INHALE 2 PUFFS BY MOUTH EVERY 6 HOURS AS NEEDED 18 g 0   No current facility-administered medications for this visit.    No Known Allergies  Social History   Socioeconomic History  . Marital status: Married    Spouse name: Not on file  . Number of children: 2  . Years of education: Not on file  . Highest education level: Not on file  Occupational History  . Occupation: Unemployed  Tobacco Use  . Smoking status: Former Smoker    Packs/day: 0.25    Years: 30.00    Pack years: 7.50    Types: Cigarettes    Quit date: 05/30/2014    Years since quitting: 5.7  . Smokeless tobacco: Never Used  Vaping Use  . Vaping Use: Never used  Substance and Sexual Activity  . Alcohol use: Yes    Alcohol/week: 2.0 standard drinks    Types: 2 Standard drinks or equivalent per week    Comment: occ  . Drug use: No  . Sexual activity: Yes  Other Topics Concern  . Not on file  Social History Narrative   Married.   2 children.   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   She enjoys being with her dogs, shopping.    Social Determinants of Health   Financial Resource Strain:   . Difficulty of Paying Living Expenses:   Food Insecurity:   . Worried About Charity fundraiser in the Last Year:   . Arboriculturist in the Last Year:   Transportation Needs:   . Film/video editor (Medical):   Marland Kitchen Lack of Transportation (Non-Medical):   Physical Activity:   . Days of Exercise per Week:   . Minutes of Exercise per Session:   Stress:   . Feeling of Stress :   Social Connections:   . Frequency of Communication with Friends and Family:   . Frequency of Social Gatherings with Friends and Family:   . Attends Religious Services:   . Active Member of Clubs or Organizations:   . Attends Archivist  Meetings:   Marland Kitchen Marital Status:   Intimate Partner Violence:   . Fear of Current or Ex-Partner:   . Emotionally Abused:   Marland Kitchen Physically Abused:   . Sexually Abused:     Family History  Problem Relation Age of Onset  . Breast cancer Mother 39  . Heart disease Mother        died CHF 55  . Diabetes Mother   . Prostate cancer Father   . Hyperlipidemia Father   .  Heart disease Father        s/p cabg - alive  . Colon cancer Other        First cousin on dads side   . Thyroid disease Maternal Grandmother     Review of Systems:  As stated in the HPI and otherwise negative.   BP 116/70   Pulse 78   Ht 5\' 4"  (1.626 m)   Wt 179 lb 3.2 oz (81.3 kg)   LMP 07/28/2010   SpO2 97%   BMI 30.76 kg/m   Physical Examination: General: Well developed, well nourished, NAD  HEENT: OP clear, mucus membranes moist  SKIN: warm, dry. No rashes. Neuro: No focal deficits  Musculoskeletal: Muscle strength 5/5 all ext  Psychiatric: Mood and affect normal  Neck: No JVD, no carotid bruits, no thyromegaly, no lymphadenopathy.  Lungs:Clear bilaterally, no wheezes, rhonci, crackles Cardiovascular: Regular rate and rhythm. No murmurs, gallops or rubs. Abdomen:Soft. Bowel sounds present. Non-tender.  Extremities: No lower extremity edema. Pulses are 2 + in the bilateral DP/PT.  Cardiac cath September 12, 2011: Left main: No obstructive disease. As noted above, pt with intense vasospasm at beginning of case.  Left Anterior Descending Artery: Ostial spasm however after NTG given, there is minimal obstructive disease in the ostium with mild calcification. The diagonal has ostial 50% stenosis, unchanged from previous cath.  Circumflex Artery: During initial injections, there is a 99% mid stenosis, however, after NTG given this area responded well with minimal 20% stenosis. The proximal portion of the OM 1 also had 30% stenosis.  Right Coronary Artery: Large, dominant vessel with 30% mid stenosis.  Left  Ventricular Angiogram: LVEF=55%.   EKG:  EKG is not  ordered today. The ekg ordered today demonstrates   Recent Labs: 04/26/2019: TSH 1.52 02/18/2020: ALT 29; BUN 13; Creatinine, Ser 0.75; Potassium 3.6; Sodium 137 02/21/2020: Hemoglobin 14.2; Platelets 218.0   Lipid Panel    Component Value Date/Time   CHOL 168 03/13/2019 1411   TRIG 84 03/13/2019 1411   HDL 89 03/13/2019 1411   CHOLHDL 1.9 03/13/2019 1411   CHOLHDL 2 09/26/2017 0910   VLDL 11.8 09/26/2017 0910   LDLCALC 62 03/13/2019 1411     Wt Readings from Last 3 Encounters:  02/27/20 179 lb 3.2 oz (81.3 kg)  02/21/20 177 lb 12 oz (80.6 kg)  02/18/20 168 lb (76.2 kg)     Other studies Reviewed: Additional studies/ records that were reviewed today include: . Review of the above records demonstrates:   Assessment and Plan:   1. CAD with stable angina/suspected coronary vasospasm: She has moderate CAD by cath in 2013 with documented coronary vasospasm. She has no chest pain on current therapy. Ranexa stopped due to cost. Continue ASA, Diltiazem, Isordil and statin.    2. Hyperlipidemia: Lipids followed in primary care. LDL 62 in 2020. Continue statin     Current medicines are reviewed at length with the patient today.  The patient does not have concerns regarding medicines.  The following changes have been made:  no change  Labs/ tests ordered today include:   No orders of the defined types were placed in this encounter.   Disposition:   FU with me in 12 months  Signed, Lauree Chandler, MD 02/27/2020 10:52 AM    [facility] [address], [place], [place]  [address] Phone: [phone]; Fax: [phone]

## 2020-03-04 ENCOUNTER — Ambulatory Visit
Admission: RE | Admit: 2020-03-04 | Discharge: 2020-03-04 | Disposition: A | Discharge status: Home / Self Care | Payer: BC Managed Care – PPO | Source: Ambulatory Visit | Attending: Primary Care | Admitting: Primary Care

## 2020-03-04 ENCOUNTER — Other Ambulatory Visit: Payer: Self-pay

## 2020-03-04 DIAGNOSIS — Q6102 Congenital multiple renal cysts: Secondary | ICD-10-CM | POA: Diagnosis not present

## 2020-03-04 DIAGNOSIS — N9489 Other specified conditions associated with female genital organs and menstrual cycle: Secondary | ICD-10-CM

## 2020-03-04 DIAGNOSIS — N838 Other noninflammatory disorders of ovary, fallopian tube and broad ligament: Secondary | ICD-10-CM | POA: Diagnosis not present

## 2020-03-04 DIAGNOSIS — N854 Malposition of uterus: Secondary | ICD-10-CM | POA: Diagnosis not present

## 2020-03-04 DIAGNOSIS — N83291 Other ovarian cyst, right side: Secondary | ICD-10-CM | POA: Diagnosis not present

## 2020-03-04 DIAGNOSIS — N281 Cyst of kidney, acquired: Secondary | ICD-10-CM | POA: Diagnosis not present

## 2020-03-04 DIAGNOSIS — Q600 Renal agenesis, unilateral: Secondary | ICD-10-CM | POA: Diagnosis not present

## 2020-03-05 ENCOUNTER — Telehealth: Payer: Self-pay | Admitting: Primary Care

## 2020-03-05 DIAGNOSIS — R1907 Generalized intra-abdominal and pelvic swelling, mass and lump: Secondary | ICD-10-CM

## 2020-03-05 DIAGNOSIS — N9489 Other specified conditions associated with female genital organs and menstrual cycle: Secondary | ICD-10-CM

## 2020-03-05 NOTE — Telephone Encounter (Signed)
Received a call from [facility] with a call report for U/S Pelvic   IMPRESSION: Tiny probable subserosal leiomyoma at posterior upper uterus 14 mm diameter.  Unremarkable endometrial complex and LEFT ovary.  5.5 cm diameter postmenopausal cyst within RIGHT adnexa, of ovarian versus paraovarian origin, containing a few scattered internal echoes which are felt to be artifactual; no additional complicating features such as mural nodularity, wall irregularity or septations are identified.  Finding is of concern for low-grade cystic neoplasm.  Either gynecologic consultation with follow-up ultrasound 3-6 months, or further characterization by MR imaging with and without IV contrast recommended.

## 2020-03-05 NOTE — Telephone Encounter (Signed)
FYI.Marland KitchenMarland KitchenResults from both ultrasound are in

## 2020-03-05 NOTE — Telephone Encounter (Signed)
My chart message sent to patient earlier today.  See result note.

## 2020-03-11 ENCOUNTER — Other Ambulatory Visit: Payer: Self-pay | Admitting: Obstetrics

## 2020-03-24 ENCOUNTER — Other Ambulatory Visit: Payer: Self-pay

## 2020-03-24 ENCOUNTER — Ambulatory Visit (INDEPENDENT_AMBULATORY_CARE_PROVIDER_SITE_OTHER): Payer: BC Managed Care – PPO

## 2020-03-24 DIAGNOSIS — R1907 Generalized intra-abdominal and pelvic swelling, mass and lump: Secondary | ICD-10-CM

## 2020-03-24 DIAGNOSIS — D251 Intramural leiomyoma of uterus: Secondary | ICD-10-CM | POA: Diagnosis not present

## 2020-03-24 MED ORDER — GADOBUTROL 1 MMOL/ML IV SOLN
8.0000 mL | Freq: Once | INTRAVENOUS | Status: AC | PRN
  Administered 2020-03-24: 8 mL via INTRAVENOUS

## 2020-03-25 NOTE — Telephone Encounter (Signed)
Do you want me to let patient know?  I spoken to [facility] and she stated that patient will need to call [facility].  She may need to go there and sign a medical release form then they can send the report and images to her GYN's office.  This is the fastest to get the report to

## 2020-03-25 NOTE — Telephone Encounter (Signed)
Can you help with getting her MRI results to her GYN's office?

## 2020-03-26 NOTE — Telephone Encounter (Signed)
Caroline [name], can you please take over?

## 2020-04-04 DIAGNOSIS — N83201 Unspecified ovarian cyst, right side: Secondary | ICD-10-CM | POA: Diagnosis not present

## 2020-04-04 DIAGNOSIS — Z803 Family history of malignant neoplasm of breast: Secondary | ICD-10-CM | POA: Diagnosis not present

## 2020-05-04 ENCOUNTER — Other Ambulatory Visit: Payer: Self-pay | Admitting: Primary Care

## 2020-05-04 DIAGNOSIS — E039 Hypothyroidism, unspecified: Secondary | ICD-10-CM

## 2020-06-17 DIAGNOSIS — N83201 Unspecified ovarian cyst, right side: Secondary | ICD-10-CM | POA: Diagnosis not present

## 2020-06-26 ENCOUNTER — Other Ambulatory Visit: Payer: Self-pay | Admitting: Primary Care

## 2020-06-26 DIAGNOSIS — F419 Anxiety disorder, unspecified: Secondary | ICD-10-CM

## 2020-08-05 ENCOUNTER — Other Ambulatory Visit: Payer: Self-pay | Admitting: Primary Care

## 2020-08-05 DIAGNOSIS — E039 Hypothyroidism, unspecified: Secondary | ICD-10-CM

## 2020-08-18 DIAGNOSIS — Z1231 Encounter for screening mammogram for malignant neoplasm of breast: Secondary | ICD-10-CM | POA: Diagnosis not present

## 2020-08-18 LAB — HM MAMMOGRAPHY

## 2020-08-21 ENCOUNTER — Telehealth: Payer: Self-pay | Admitting: *Deleted

## 2020-08-21 ENCOUNTER — Encounter: Payer: Self-pay | Admitting: Primary Care

## 2020-08-21 NOTE — Telephone Encounter (Signed)
LM2CB-pt needs appt before 1-20.

## 2020-08-21 NOTE — Telephone Encounter (Signed)
Patient called back to schedule pre-op clearance appt. Pt did not want to schedule appt at [facility] 09/18/20 because that is the same day as her procedure. Put patient on a high priority wait list

## 2020-08-21 NOTE — Telephone Encounter (Signed)
   [facility] Pre-operative Risk Assessment    HEARTCARE STAFF: - Please ensure there is not already an duplicate clearance open for this procedure. - Under Visit Info/Reason for Call, type in Other and utilize the format Clearance MM/DD/YY or Clearance TBD. Do not use dashes or single digits. - If request is for dental extraction, please clarify the # of teeth to be extracted.  Request for surgical clearance:  1. What type of surgery is being performed? LAPAROSCOPIC RIGHT SALPINGO-OOPHORECTOMY    2. When is this surgery scheduled? 09/18/20   3. What type of clearance is required (medical clearance vs. Pharmacy clearance to hold med vs. Both)? MEDICAL  4. Are there any medications that need to be held prior to surgery and how long? ASA    5. Practice name and name of physician performing surgery? [facility]; DR. Aloha Gell   6. What is the office phone number? [phone]   7.   What is the office fax number? [phone] ATTN: SHANELLE  8.   Anesthesia type (None, local, MAC, general) ? GENERAL   Julaine Hua 08/21/2020, 8:58 AM  _________________________________________________________________   (provider comments below)

## 2020-08-21 NOTE — Telephone Encounter (Signed)
Primary Cardiologist:Caroline Angelena Form, MD  Chart reviewed as part of pre-operative protocol coverage. Because of Caroline [name]'s past medical history and time since last visit, he/she will require a follow-up visit in order to better assess preoperative cardiovascular risk.  Pre-op covering staff: - Please schedule appointment and call patient to inform them. - Please contact requesting surgeon's office via preferred method (i.e, phone, fax) to inform them of need for appointment prior to surgery.  If applicable, this message will also be routed to pharmacy pool and/or primary cardiologist for input on holding anticoagulant/antiplatelet agent as requested below so that this information is available at time of patient's appointment.   Caroline Pelton, NP  08/21/2020, 9:11 AM

## 2020-08-25 NOTE — Telephone Encounter (Signed)
Patient is scheduled to see Joni Reining, NP on 09/05/2020 at 1:45 PM. Will route to requesting surgeons office to make them aware.

## 2020-08-26 NOTE — Progress Notes (Signed)
Primary Cardiologist: Harrold  Primary Care Physician: Pleas Koch, NP  History of present illness: 59 yo female with history of CAD, Prinzmetal angina, HLD, IBS, GERD who is here today for cardiac follow up. She had been followed by Dr. Mare Ferrari prior to his retirement. She has Prinzmetal angina controlled with Diltiazem, Isordil and Ranexa. Last cardiac cath in January 2013 at which time she was noted to have spasm in the ostial LAD with mild calcification, ostial Diagonal 50% stenosis, 20% mid Circumflex stenosis, 30% OM stenosis, 30% mid RCA stenosis. LVEF 55% by cath. ABI normal September 2017. Nuclear stress test in February 2018 with no ischemia. She was seen in the [facility] ED 02/19/20 with epigastric pain. Troponin negative. EKG without ischemic changes.   She is here today for preoperative evaluation in the setting of ovarian mass, and pelvic pain.  She is to have a laparoscopic right salpingonephrectomy by Dr. Glo Herring on 09/18/2020.  She is here for preoperative evaluation.  She has no complaints of chest pai or shortness of breath.  Her energy level is lower as she has been having some discomfort in her abdomen, and has been recently diagnosed with hypothyroidism.  Her main complaint is fatigue associated with both.  Otherwise, she is anxious to have her surgery and get it up with so that she can get back to feeling her best.  Past Medical History:  Diagnosis Date  . CAD (coronary artery disease)    has documented coronary vasospasm but with underlying 40% prox LAD, 30% prox LCX and 70 to 80% proximal 1st DX and 30% RCA; recurrent vasospasm with subsequent cath in Jan 2013.   Marland Kitchen Coronary vasospasm ([place])   . Environmental and seasonal allergies   . Esophagitis   . GERD (gastroesophageal reflux disease)   . Hyperlipidemia   . Hypertension   . IBS (irritable bowel syndrome)   . Palpitations    tachycardia on holter; treated with beta blockers    Past Surgical History:   Procedure Laterality Date  . APPENDECTOMY  1976  . BREAST EXCISIONAL BIOPSY Right    benign  . BREAST SURGERY  2002   biopsy  . CARDIAC CATHETERIZATION  2011  . CARDIAC CATHETERIZATION  Sept 2012   With documented vasospasm but with residual disease; managed medically  . CHOLECYSTECTOMY  1982  . LEFT HEART CATHETERIZATION WITH CORONARY ANGIOGRAM N/A 09/14/2011   Procedure: LEFT HEART CATHETERIZATION WITH CORONARY ANGIOGRAM;  Surgeon: Burnell Blanks, MD;  Location: [facility] CATH LAB;  Service: Cardiovascular;  Laterality: N/A;  . UPPER GASTROINTESTINAL ENDOSCOPY  01/11/2011   esophagitis     Current Outpatient Medications  Medication Sig Dispense Refill  . acetaminophen (TYLENOL) 500 MG tablet Take 500 mg by mouth every 6 (six) hours as needed. For pain    . atorvastatin (LIPITOR) 20 MG tablet Take 1 tablet (20 mg total) by mouth daily. 90 tablet 3  . Cholecalciferol (VITAMIN D) 2000 units CAPS Take 2000 units once per day. 30 capsule   . alum & mag hydroxide-simeth (MAALOX MAX) 400-400-40 MG/5ML suspension Take 5 mLs by mouth every 6 (six) hours as needed for indigestion. (Patient not taking: Reported on 09/05/2020) 355 mL 0  . aspirin 81 MG tablet Take 81 mg by mouth daily.   (Patient not taking: Reported on 09/05/2020)    . diclofenac (VOLTAREN) 75 MG EC tablet Take 1 tablet (75 mg total) by mouth 2 (two) times daily as needed. 60 tablet 0  . diclofenac Sodium (VOLTAREN)  1 % GEL Apply 2-4 g topically 4 (four) times daily. 200 g 2  . diltiazem (TIAZAC) 300 MG 24 hr capsule TAKE 1 CAPSULE BY MOUTH EVERY DAY 90 capsule 3  . fluticasone (FLONASE) 50 MCG/ACT nasal spray Place 2 sprays into both nostrils daily. 16 g 6  . isosorbide dinitrate (ISORDIL) 30 MG tablet TAKE 1 TABLET BY MOUTH 3 TIMES DAILY. 270 tablet 3  . levocetirizine (XYZAL) 5 MG tablet Take 1 tablet (5 mg total) by mouth at bedtime as needed for allergies. 90 tablet 3  . levothyroxine (SYNTHROID) 50 MCG tablet TAKE 1 TABLET BY  MOUTH EVERY MORNING ON AN EMPTY STOMACH 30 tablet 0  . nitroGLYCERIN (NITROSTAT) 0.4 MG SL tablet Place 1 tablet (0.4 mg total) under the tongue every 5 (five) minutes as needed for chest pain. X 3 doses 25 tablet 3  . ondansetron (ZOFRAN ODT) 4 MG disintegrating tablet Take 1 tablet (4 mg total) by mouth every 8 (eight) hours as needed. 20 tablet 0  . pantoprazole (PROTONIX) 40 MG tablet Take 1 tablet (40 mg total) by mouth daily. 30 tablet 1  . sertraline (ZOLOFT) 50 MG tablet TAKE 1 TABLET BY MOUTH EVERYDAY AT BEDTIME 90 tablet 0  . VENTOLIN HFA 108 (90 Base) MCG/ACT inhaler INHALE 2 PUFFS BY MOUTH EVERY 6 HOURS AS NEEDED 18 g 0   No current facility-administered medications for this visit.    Allergies:   Patient has no known allergies.    Social History:  The patient  reports that she quit smoking about 6 years ago. Her smoking use included cigarettes. She has a 7.50 pack-year smoking history. She has never used smokeless tobacco. She reports current alcohol use of about 2.0 standard drinks of alcohol per week. She reports that she does not use drugs.   Family History:  The patient's family history includes Breast cancer (age of onset: 62) in her mother; Colon cancer in an other family member; Diabetes in her mother; Heart disease in her father and mother; Hyperlipidemia in her father; Prostate cancer in her father; Thyroid disease in her maternal grandmother.    ROS: All other systems are reviewed and negative. Unless otherwise mentioned in H&P    PHYSICAL EXAM: VS:  LMP 07/28/2010  , BMI There is no height or weight on file to calculate BMI. GEN: Well nourished, well developed, in no acute distress HEENT: normal Neck: no JVD, carotid bruits, or masses Cardiac: RRR; 1/6  murmurs, rubs, or gallops,no edema  Respiratory:  Clear to auscultation bilaterally, normal work of breathing GI: soft, nontender, nondistended, + BS MS: no deformity or atrophy Skin: warm and dry, no rash Neuro:   Strength and sensation are intact Psych: euthymic mood, full affect   EKG: Normal sinus rhythm heart rate of 91 bpm.  Recent Labs: 02/18/2020: ALT 29; BUN 13; Creatinine, Ser 0.75; Potassium 3.6; Sodium 137 02/21/2020: Hemoglobin 14.2; Platelets 218.0    Lipid Panel    Component Value Date/Time   CHOL 168 03/13/2019 1411   TRIG 84 03/13/2019 1411   HDL 89 03/13/2019 1411   CHOLHDL 1.9 03/13/2019 1411   CHOLHDL 2 09/26/2017 0910   VLDL 11.8 09/26/2017 0910   LDLCALC 62 03/13/2019 1411      Wt Readings from Last 3 Encounters:  02/27/20 179 lb 3.2 oz (81.3 kg)  02/21/20 177 lb 12 oz (80.6 kg)  02/18/20 168 lb (76.2 kg)    Other studies Reviewed:  Cardiac cath September 12, 2011: Left  main: No obstructive disease. As noted above, pt with intense vasospasm at beginning of case.  Left Anterior Descending Artery: Ostial spasm however after NTG given, there is minimal obstructive disease in the ostium with mild calcification. The diagonal has ostial 50% stenosis, unchanged from previous cath.  Circumflex Artery: During initial injections, there is a 99% mid stenosis, however, after NTG given this area responded well with minimal 20% stenosis. The proximal portion of the OM 1 also had 30% stenosis.  Right Coronary Artery: Large, dominant vessel with 30% mid stenosis.  Left Ventricular Angiogram: LVEF=55%.    ASSESSMENT AND PLAN:  1.  Cardiology Pre-Op Evaluation:   Chart reviewed as part of pre-operative protocol coverage. Given past medical history and time since last visit, based on ACC/AHA guidelines, Caroline [name] would be at acceptable risk for the planned procedure without further cardiovascular testing.   2. CAD: Chest pain associated with coronary vasospasm but did have underlying 40% proximal LAD and 30% proximal circumflex and 7080% proximal first diagonal with 30% RCA noted per cath in 2013.  She has been asymptomatic and continues on nitrates.  She is medically  compliant.  3.  Prinzmetal angina: She offers no complaints of chest discomfort and has tolerated diltiazem.  She is no longer on Ranexa.  4.  Hyperlipidemia: Patient remains on atorvastatin 20 mg daily with goal of LDL less than 70.  Labs are drawn by PCP.   Current medicines are reviewed at length with the patient today.  I have spent 25 minutes dedicated to the care of this patient on the date of this encounter to include pre-visit review of records, assessment, management and diagnostic testing,with shared decision making.  Labs/ tests ordered today include: None  Phill Myron. [place] Pugh, ANP, AACC   09/05/2020 1:48 PM    [facility] [address] Office [phone] Fax [phone]  Notice: This dictation was prepared with Dragon dictation along with smaller phrase technology. Any transcriptional errors that result from this process are unintentional and may not be corrected upon review.

## 2020-08-27 DIAGNOSIS — N83201 Unspecified ovarian cyst, right side: Secondary | ICD-10-CM | POA: Insufficient documentation

## 2020-08-27 DIAGNOSIS — I252 Old myocardial infarction: Secondary | ICD-10-CM | POA: Insufficient documentation

## 2020-08-27 DIAGNOSIS — Z803 Family history of malignant neoplasm of breast: Secondary | ICD-10-CM | POA: Insufficient documentation

## 2020-09-02 ENCOUNTER — Other Ambulatory Visit: Payer: Self-pay | Admitting: Primary Care

## 2020-09-02 DIAGNOSIS — E039 Hypothyroidism, unspecified: Secondary | ICD-10-CM

## 2020-09-05 ENCOUNTER — Other Ambulatory Visit: Payer: Self-pay | Admitting: Obstetrics

## 2020-09-05 ENCOUNTER — Other Ambulatory Visit: Payer: Self-pay

## 2020-09-05 ENCOUNTER — Ambulatory Visit: Payer: BC Managed Care – PPO | Admitting: Adult Health

## 2020-09-05 ENCOUNTER — Encounter: Payer: Self-pay | Admitting: Adult Health

## 2020-09-05 VITALS — BP 140/62 | HR 96 | Resp 18 | Ht 64.0 in | Wt 179.8 lb

## 2020-09-05 DIAGNOSIS — Z01818 Encounter for other preprocedural examination: Secondary | ICD-10-CM

## 2020-09-05 DIAGNOSIS — E7800 Pure hypercholesterolemia, unspecified: Secondary | ICD-10-CM

## 2020-09-05 DIAGNOSIS — I201 Angina pectoris with documented spasm: Secondary | ICD-10-CM | POA: Diagnosis not present

## 2020-09-05 DIAGNOSIS — I2510 Atherosclerotic heart disease of native coronary artery without angina pectoris: Secondary | ICD-10-CM

## 2020-09-05 NOTE — Patient Instructions (Signed)
Medication Instructions:  Your physician recommends that you continue on your current medications as directed. Please refer to the Current Medication list given to you today.  *If you need a refill on your cardiac medications before your next appointment, please call your pharmacy*   Follow-Up: At [facility], you and your health needs are our priority.  As part of our continuing mission to provide you with exceptional heart care, we have created designated Provider Care Teams.  These Care Teams include your primary Cardiologist (physician) and Advanced Practice Providers (APPs -  Physician Assistants and Nurse Practitioners) who all work together to provide you with the care you need, when you need it.  We recommend signing up for the patient portal called "MyChart".  Sign up information is provided on this After Visit Summary.  MyChart is used to connect with patients for Virtual Visits (Telemedicine).  Patients are able to view lab/test results, encounter notes, upcoming appointments, etc.  Non-urgent messages can be sent to your provider as well.   To learn more about what you can do with MyChart, go to [email or web address].    Your next appointment:   6 month(s)  The format for your next appointment:   In Person  Provider:   Lauree Chandler, MD   Other Instructions Surgical clearance done.

## 2020-09-07 LAB — RESULTS CONSOLE HPV: CHL HPV: NEGATIVE

## 2020-09-08 DIAGNOSIS — Z124 Encounter for screening for malignant neoplasm of cervix: Secondary | ICD-10-CM | POA: Diagnosis not present

## 2020-09-08 DIAGNOSIS — Z01419 Encounter for gynecological examination (general) (routine) without abnormal findings: Secondary | ICD-10-CM | POA: Diagnosis not present

## 2020-09-08 DIAGNOSIS — Z6832 Body mass index (BMI) 32.0-32.9, adult: Secondary | ICD-10-CM | POA: Diagnosis not present

## 2020-09-10 ENCOUNTER — Other Ambulatory Visit: Payer: Self-pay

## 2020-09-10 ENCOUNTER — Ambulatory Visit (INDEPENDENT_AMBULATORY_CARE_PROVIDER_SITE_OTHER): Payer: BC Managed Care – PPO | Admitting: Primary Care

## 2020-09-10 VITALS — BP 122/78 | HR 92 | Temp 97.4°F | Ht 64.0 in | Wt 182.0 lb

## 2020-09-10 DIAGNOSIS — Q6102 Congenital multiple renal cysts: Secondary | ICD-10-CM

## 2020-09-10 DIAGNOSIS — F419 Anxiety disorder, unspecified: Secondary | ICD-10-CM | POA: Diagnosis not present

## 2020-09-10 DIAGNOSIS — Z23 Encounter for immunization: Secondary | ICD-10-CM

## 2020-09-10 DIAGNOSIS — Z01818 Encounter for other preprocedural examination: Secondary | ICD-10-CM

## 2020-09-10 DIAGNOSIS — E039 Hypothyroidism, unspecified: Secondary | ICD-10-CM

## 2020-09-10 DIAGNOSIS — I10 Essential (primary) hypertension: Secondary | ICD-10-CM | POA: Diagnosis not present

## 2020-09-10 DIAGNOSIS — R000 Tachycardia, unspecified: Secondary | ICD-10-CM

## 2020-09-10 DIAGNOSIS — E785 Hyperlipidemia, unspecified: Secondary | ICD-10-CM

## 2020-09-10 DIAGNOSIS — R7303 Prediabetes: Secondary | ICD-10-CM

## 2020-09-10 DIAGNOSIS — N9489 Other specified conditions associated with female genital organs and menstrual cycle: Secondary | ICD-10-CM

## 2020-09-10 DIAGNOSIS — I2510 Atherosclerotic heart disease of native coronary artery without angina pectoris: Secondary | ICD-10-CM

## 2020-09-10 MED ORDER — SERTRALINE HCL 50 MG PO TABS: ORAL_TABLET | ORAL | 3 refills | Status: DC

## 2020-09-10 NOTE — Assessment & Plan Note (Signed)
Controlled, continue diltiazem 300 mg daily,.

## 2020-09-10 NOTE — Assessment & Plan Note (Signed)
Discussed the importance of a healthy diet and regular exercise in order for weight loss, and to reduce the risk of any potential medical problems.  Repeat A1C pending. 

## 2020-09-10 NOTE — Assessment & Plan Note (Signed)
Pending laprascopic right salpingo-oophorectomy for 09/18/20 per Dr. Pamala Hurry.   Exam today stable. Labs pending. Cardiology has cleared from a cardiac stand point. Once labs return, should be able to clear.

## 2020-09-10 NOTE — Assessment & Plan Note (Signed)
Well controlled on Zoloft 50 mg, continue same.

## 2020-09-10 NOTE — Assessment & Plan Note (Signed)
Well controlled in the office today, continue diltiazem 300 mg daily.

## 2020-09-10 NOTE — Assessment & Plan Note (Signed)
Following with GYN, pending right oophorectomy for 09/18/20.

## 2020-09-10 NOTE — Assessment & Plan Note (Signed)
Repeat lipid panel pending. Continue atorvastatin 20 mg.  

## 2020-09-10 NOTE — Assessment & Plan Note (Addendum)
Asymptomatic. Continue statin therapy and BP control. Continue isosorbide dinitrate 30 mg TID and PRN nitro. Lipids pending.

## 2020-09-10 NOTE — Patient Instructions (Signed)
Stop by the lab prior to leaving today. I will notify you of your results once received.   Be sure to take your levothyroxine (thyroid medication) every morning on an empty stomach with water only. No food or other medications for 30 minutes. No heartburn medication, iron pills, calcium, vitamin D, or magnesium pills within four hours of taking levothyroxine.   It was a pleasure to see you today!  

## 2020-09-10 NOTE — Assessment & Plan Note (Signed)
Grossly unchanged from imaging in 2012, see renal ultrasound from 2021.

## 2020-09-10 NOTE — Progress Notes (Signed)
Subjective:    Patient ID: Caroline [name], female    DOB: 14-Jul-1961, 60 y.o.   MRN: [id number]  HPI  This visit occurred during the SARS-CoV-2 public health emergency.  Safety protocols were in place, including screening questions prior to the visit, additional usage of staff PPE, and extensive cleaning of exam room while observing appropriate contact time as indicated for disinfecting solutions.   Caroline [name] is a 60 year old female with a history of CAD, asthma, hypertension, hypothyroidism, renal cysts, hyperlipidemia, prediabetes who presents today for surgical clearance.   She is pending surgery for laparoscopic right salpingo-oophorectomy on 09/18/20 for right ovarian cyst. Evaluated by cardiology on 09/05/20.  She has no concerns today except for chronic fatigue. She's also noticed hair thinning. She's unsure if her fatigue is from the ovarian cyst or from her thyroid. She is doing well on sertraline daily, is needing refills today.  BP Readings from Last 3 Encounters:  09/10/20 122/78  09/05/20 140/62  02/27/20 116/70     Review of Systems  Constitutional: Positive for fatigue. Negative for unexpected weight change.  HENT: Negative for rhinorrhea.   Respiratory: Negative for cough and shortness of breath.   Cardiovascular: Negative for chest pain.  Gastrointestinal: Negative for constipation and diarrhea.  Genitourinary: Negative for difficulty urinating.  Musculoskeletal: Negative for arthralgias and myalgias.  Skin: Negative for rash.  Allergic/Immunologic: Negative for environmental allergies.  Neurological: Negative for dizziness and headaches.       Past Medical History:  Diagnosis Date  . CAD (coronary artery disease)    has documented coronary vasospasm but with underlying 40% prox LAD, 30% prox LCX and 70 to 80% proximal 1st DX and 30% RCA; recurrent vasospasm with subsequent cath in Jan 2013.   Marland Kitchen Coronary vasospasm ([place])   . Environmental and seasonal  allergies   . Esophagitis   . GERD (gastroesophageal reflux disease)   . Hyperlipidemia   . Hypertension   . IBS (irritable bowel syndrome)   . Palpitations    tachycardia on holter; treated with beta blockers     Social History   Socioeconomic History  . Marital status: Married    Spouse name: Not on file  . Number of children: 2  . Years of education: Not on file  . Highest education level: Not on file  Occupational History  . Occupation: Unemployed  Tobacco Use  . Smoking status: Former Smoker    Packs/day: 0.25    Years: 30.00    Pack years: 7.50    Types: Cigarettes    Quit date: 05/30/2014    Years since quitting: 6.2  . Smokeless tobacco: Never Used  Vaping Use  . Vaping Use: Never used  Substance and Sexual Activity  . Alcohol use: Yes    Alcohol/week: 2.0 standard drinks    Types: 2 Standard drinks or equivalent per week    Comment: occ  . Drug use: No  . Sexual activity: Yes  Other Topics Concern  . Not on file  Social History Narrative   Married.   2 children.   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   She enjoys being with her dogs, shopping.    Social Determinants of Health   Financial Resource Strain: Not on file  Food Insecurity: Not on file  Transportation Needs: Not on file  Physical Activity: Not on file  Stress: Not on file  Social Connections: Not on file  Intimate Partner Violence: Not on file    Past Surgical  History:  Procedure Laterality Date  . APPENDECTOMY  1976  . BREAST EXCISIONAL BIOPSY Right    benign  . BREAST SURGERY  2002   biopsy  . CARDIAC CATHETERIZATION  2011  . CARDIAC CATHETERIZATION  Sept 2012   With documented vasospasm but with residual disease; managed medically  . CHOLECYSTECTOMY  1982  . LEFT HEART CATHETERIZATION WITH CORONARY ANGIOGRAM N/A 09/14/2011   Procedure: LEFT HEART CATHETERIZATION WITH CORONARY ANGIOGRAM;  Surgeon: Burnell Blanks, MD;  Location: [facility] CATH LAB;  Service: Cardiovascular;   Laterality: N/A;  . UPPER GASTROINTESTINAL ENDOSCOPY  01/11/2011   esophagitis    Family History  Problem Relation Age of Onset  . Breast cancer Mother 66  . Heart disease Mother        died CHF 53  . Diabetes Mother   . Prostate cancer Father   . Hyperlipidemia Father   . Heart disease Father        s/p cabg - alive  . Colon cancer Other        First cousin on dads side   . Thyroid disease Maternal Grandmother     No Known Allergies  Current Outpatient Medications on File Prior to Visit  Medication Sig Dispense Refill  . acetaminophen (TYLENOL) 500 MG tablet Take 500 mg by mouth every 6 (six) hours as needed. For pain    . aspirin 81 MG tablet Take 81 mg by mouth daily.    Marland Kitchen atorvastatin (LIPITOR) 20 MG tablet Take 1 tablet (20 mg total) by mouth daily. 90 tablet 3  . Cholecalciferol (VITAMIN D) 2000 units CAPS Take 2000 units once per day. 30 capsule   . diclofenac Sodium (VOLTAREN) 1 % GEL Apply 2-4 g topically 4 (four) times daily. 200 g 2  . diltiazem (TIAZAC) 300 MG 24 hr capsule TAKE 1 CAPSULE BY MOUTH EVERY DAY 90 capsule 3  . fluticasone (FLONASE) 50 MCG/ACT nasal spray Place 2 sprays into both nostrils daily. 16 g 6  . isosorbide dinitrate (ISORDIL) 30 MG tablet TAKE 1 TABLET BY MOUTH 3 TIMES DAILY. 270 tablet 3  . levocetirizine (XYZAL) 5 MG tablet Take 1 tablet (5 mg total) by mouth at bedtime as needed for allergies. 90 tablet 3  . levothyroxine (SYNTHROID) 50 MCG tablet TAKE 1 TABLET BY MOUTH EVERY MORNING ON AN EMPTY STOMACH 30 tablet 0  . nitroGLYCERIN (NITROSTAT) 0.4 MG SL tablet Place 1 tablet (0.4 mg total) under the tongue every 5 (five) minutes as needed for chest pain. X 3 doses 25 tablet 3  . ondansetron (ZOFRAN ODT) 4 MG disintegrating tablet Take 1 tablet (4 mg total) by mouth every 8 (eight) hours as needed. 20 tablet 0  . sertraline (ZOLOFT) 50 MG tablet TAKE 1 TABLET BY MOUTH EVERYDAY AT BEDTIME 90 tablet 0  . VENTOLIN HFA 108 (90 Base) MCG/ACT inhaler  INHALE 2 PUFFS BY MOUTH EVERY 6 HOURS AS NEEDED 18 g 0   No current facility-administered medications on file prior to visit.    BP 122/78   Pulse 92   Temp (!) 97.4 F (36.3 C) (Temporal)   Ht 5\' 4"  (1.626 m)   Wt 182 lb (82.6 kg)   LMP 07/28/2010   SpO2 96%   BMI 31.24 kg/m    Objective:   Physical Exam Constitutional:      Appearance: She is well-nourished.  HENT:     Right Ear: Tympanic membrane and ear canal normal.     Left Ear: Tympanic  membrane and ear canal normal.     Mouth/Throat:     Mouth: Oropharynx is clear and moist.  Eyes:     Extraocular Movements: EOM normal.     Pupils: Pupils are equal, round, and reactive to light.  Cardiovascular:     Rate and Rhythm: Normal rate and regular rhythm.  Pulmonary:     Effort: Pulmonary effort is normal.     Breath sounds: Normal breath sounds.  Abdominal:     General: Bowel sounds are normal.     Palpations: Abdomen is soft.     Tenderness: There is no abdominal tenderness.  Musculoskeletal:        General: Normal range of motion.     Cervical back: Neck supple.  Skin:    General: Skin is warm and dry.  Neurological:     Mental Status: She is alert and oriented to person, place, and time.     Cranial Nerves: No cranial nerve deficit.     Deep Tendon Reflexes:     Reflex Scores:      Patellar reflexes are 2+ on the right side and 2+ on the left side. Psychiatric:        Mood and Affect: Mood and affect and mood normal.            Assessment & Plan:

## 2020-09-10 NOTE — Assessment & Plan Note (Signed)
She is taking levothyroxine correctly, repeat TSH pending. Continue levothyroxine 50 mcg for now.

## 2020-09-11 LAB — COMPREHENSIVE METABOLIC PANEL
ALT: 22 U/L (ref 0–35)
AST: 21 U/L (ref 0–37)
Albumin: 4.7 g/dL (ref 3.5–5.2)
Alkaline Phosphatase: 86 U/L (ref 39–117)
BUN: 13 mg/dL (ref 6–23)
CO2: 32 mEq/L (ref 19–32)
Calcium: 10 mg/dL (ref 8.4–10.5)
Chloride: 98 mEq/L (ref 96–112)
Creatinine, Ser: 0.72 mg/dL (ref 0.40–1.20)
GFR: 90.44 mL/min (ref >60.00)
Glucose, Bld: 99 mg/dL (ref 70–99)
Potassium: 4.2 mEq/L (ref 3.5–5.1)
Sodium: 137 mEq/L (ref 135–145)
Total Bilirubin: 0.4 mg/dL (ref 0.2–1.2)
Total Protein: 7.6 g/dL (ref 6.0–8.3)

## 2020-09-11 LAB — LIPID PANEL
Cholesterol: 168 mg/dL (ref 0–200)
HDL: 81.5 mg/dL (ref >39.00)
LDL Cholesterol: 68 mg/dL (ref 0–99)
NonHDL: 86.89
Total CHOL/HDL Ratio: 2
Triglycerides: 95 mg/dL (ref 0.0–149.0)
VLDL: 19 mg/dL (ref 0.0–40.0)

## 2020-09-11 LAB — CBC
HCT: 44.2 % (ref 36.0–46.0)
Hemoglobin: 14.9 g/dL (ref 12.0–15.0)
MCHC: 33.8 g/dL (ref 30.0–36.0)
MCV: 97 fl (ref 78.0–100.0)
Platelets: 197 10*3/uL (ref 150.0–400.0)
RBC: 4.56 Mil/uL (ref 3.87–5.11)
RDW: 14 % (ref 11.5–15.5)
WBC: 9.1 10*3/uL (ref 4.0–10.5)

## 2020-09-11 LAB — HEMOGLOBIN A1C: Hgb A1c MFr Bld: 6 % (ref 4.6–6.5)

## 2020-09-11 LAB — TSH: TSH: 2.76 u[IU]/mL (ref 0.35–4.50)

## 2020-09-16 ENCOUNTER — Other Ambulatory Visit: Payer: BC Managed Care – PPO

## 2020-09-16 ENCOUNTER — Encounter (HOSPITAL_BASED_OUTPATIENT_CLINIC_OR_DEPARTMENT_OTHER): Payer: Self-pay | Admitting: Obstetrics

## 2020-09-16 ENCOUNTER — Other Ambulatory Visit: Payer: Self-pay

## 2020-09-16 ENCOUNTER — Other Ambulatory Visit
Admission: RE | Admit: 2020-09-16 | Discharge: 2020-09-16 | Disposition: A | Discharge status: Home / Self Care | Payer: BC Managed Care – PPO | Source: Ambulatory Visit | Attending: Obstetrics | Admitting: Obstetrics

## 2020-09-16 DIAGNOSIS — Z01812 Encounter for preprocedural laboratory examination: Secondary | ICD-10-CM | POA: Insufficient documentation

## 2020-09-16 DIAGNOSIS — Z20822 Contact with and (suspected) exposure to covid-19: Secondary | ICD-10-CM | POA: Insufficient documentation

## 2020-09-16 NOTE — Progress Notes (Addendum)
Spoke w/ via phone for pre-op interview---pt Lab needs dos----  T & S             Lab results------cbc, cmet, hemaglobin a1c, tsh lipid panel (spoke with shinelle pinder at dr Pamala Hurry office ok per dr Emilia Beck to use cbc, cmet done on 09-10-2020 for 09-18-2020 surgery ( discontinued cbc and bmet ordered by dr Pamala Hurry for 09-18-2020 surgery in epic) Heparin 5000 units pre op order discontinued, reentered heparin 5000 units sq pre op order from dr Pamala Hurry  COVID test ------09-16-2020 1130 am [facility] Arrive at -------1130 am 09-18-2020 NPO after MN NO Solid Food.  Clear liquids from MN until---1030 am then npo Medications to take morning of surgery -----isosaorbide dinitrate, ventolin inhaler prn/bring inhaler, atorvastatin, diltiazem levothyroxine Diabetic medication -----n/a Patient Special Instructions -----none Pre-Op special Istructions -----none Patient verbalized understanding of instructions that were given at this phone interview. Patient denies shortness of breath, chest pain, fever, cough at this phone interview.  Anesthesia : hx of  Cad, tachacardia,,  Coronary vasospams  [id number] clearance note Jerrel Ivory np 09-10-2020 epic Cardiologist : Ellie Lunch lawrence no cardiology cardiac clearance note 09-05-2020 epic dr Angelena Form cardiologist Chest x-ray :none EKG :09-05-2020 epic Echo : Stress test:10-01-2016 epic Cardiac Cath : 2011, 05-2011 Activity level: does own housework and climbs steps without problems Sleep Study/ CPAP :n/a Fasting Blood Sugar :      / Checks Blood Sugar -- times a day:  n/a Blood Thinner/ Instructions /Last Dose:n/a ASA / Instructions/ Last Dose : staying of 81 mg aspirin per dr Pamala Hurry, last dose will be day before surgery

## 2020-09-17 LAB — SARS CORONAVIRUS 2 (TAT 6-24 HRS): SARS Coronavirus 2: NEGATIVE

## 2020-09-17 LAB — HM PAP SMEAR

## 2020-09-18 ENCOUNTER — Ambulatory Visit (HOSPITAL_BASED_OUTPATIENT_CLINIC_OR_DEPARTMENT_OTHER): Payer: BC Managed Care – PPO | Admitting: Anesthesiology

## 2020-09-18 ENCOUNTER — Encounter (HOSPITAL_BASED_OUTPATIENT_CLINIC_OR_DEPARTMENT_OTHER): Payer: Self-pay | Admitting: Obstetrics

## 2020-09-18 ENCOUNTER — Ambulatory Visit (HOSPITAL_BASED_OUTPATIENT_CLINIC_OR_DEPARTMENT_OTHER)
Admission: RE | Admit: 2020-09-18 | Discharge: 2020-09-18 | Disposition: A | Discharge status: Home / Self Care | Payer: BC Managed Care – PPO | Attending: Obstetrics | Admitting: Obstetrics

## 2020-09-18 ENCOUNTER — Other Ambulatory Visit: Payer: Self-pay

## 2020-09-18 ENCOUNTER — Encounter (HOSPITAL_BASED_OUTPATIENT_CLINIC_OR_DEPARTMENT_OTHER)
Admission: RE | Disposition: A | Discharge status: Home / Self Care | Payer: Self-pay | Source: Home / Self Care | Attending: Obstetrics

## 2020-09-18 DIAGNOSIS — I252 Old myocardial infarction: Secondary | ICD-10-CM | POA: Insufficient documentation

## 2020-09-18 DIAGNOSIS — Z803 Family history of malignant neoplasm of breast: Secondary | ICD-10-CM | POA: Insufficient documentation

## 2020-09-18 DIAGNOSIS — N83201 Unspecified ovarian cyst, right side: Secondary | ICD-10-CM | POA: Diagnosis not present

## 2020-09-18 DIAGNOSIS — I2510 Atherosclerotic heart disease of native coronary artery without angina pectoris: Secondary | ICD-10-CM | POA: Diagnosis not present

## 2020-09-18 DIAGNOSIS — K589 Irritable bowel syndrome without diarrhea: Secondary | ICD-10-CM | POA: Diagnosis not present

## 2020-09-18 DIAGNOSIS — E039 Hypothyroidism, unspecified: Secondary | ICD-10-CM | POA: Diagnosis not present

## 2020-09-18 DIAGNOSIS — D270 Benign neoplasm of right ovary: Secondary | ICD-10-CM | POA: Diagnosis not present

## 2020-09-18 DIAGNOSIS — I10 Essential (primary) hypertension: Secondary | ICD-10-CM | POA: Diagnosis not present

## 2020-09-18 HISTORY — PX: LAPAROSCOPIC SALPINGO OOPHERECTOMY: SHX5927

## 2020-09-18 HISTORY — DX: Hypothyroidism, unspecified: E03.9

## 2020-09-18 HISTORY — DX: Unspecified ovarian cyst, right side: N83.201

## 2020-09-18 LAB — TYPE AND SCREEN
ABO/RH(D): A NEG
Antibody Screen: NEGATIVE

## 2020-09-18 LAB — ABO/RH: ABO/RH(D): A NEG

## 2020-09-18 SURGERY — SALPINGO-OOPHORECTOMY, LAPAROSCOPIC
Anesthesia: General | Laterality: Right

## 2020-09-18 MED ORDER — 0.9 % SODIUM CHLORIDE (POUR BTL) OPTIME
TOPICAL | Status: DC | PRN
Start: 2020-09-18 — End: 2020-09-18
  Administered 2020-09-18: 500 mL

## 2020-09-18 MED ORDER — EPHEDRINE 5 MG/ML INJ
INTRAVENOUS | Status: AC
  Filled 2020-09-18: qty 10

## 2020-09-18 MED ORDER — LACTATED RINGERS IV SOLN: INTRAVENOUS | Status: DC

## 2020-09-18 MED ORDER — GLYCOPYRROLATE PF 0.2 MG/ML IJ SOSY
PREFILLED_SYRINGE | INTRAMUSCULAR | Status: AC
  Filled 2020-09-18: qty 2

## 2020-09-18 MED ORDER — FENTANYL CITRATE (PF) 100 MCG/2ML IJ SOLN
INTRAMUSCULAR | Status: AC
  Filled 2020-09-18: qty 2

## 2020-09-18 MED ORDER — FENTANYL CITRATE (PF) 100 MCG/2ML IJ SOLN
50.0000 ug | Freq: Once | INTRAMUSCULAR | Status: AC
Start: 2020-09-18 — End: 2020-09-18
  Administered 2020-09-18: 50 ug via INTRAVENOUS

## 2020-09-18 MED ORDER — PROPOFOL 10 MG/ML IV BOLUS
INTRAVENOUS | Status: AC
  Filled 2020-09-18: qty 20

## 2020-09-18 MED ORDER — PROPOFOL 10 MG/ML IV BOLUS
INTRAVENOUS | Status: DC | PRN
  Administered 2020-09-18: 40 mg via INTRAVENOUS
  Administered 2020-09-18: 150 mg via INTRAVENOUS

## 2020-09-18 MED ORDER — KETOROLAC TROMETHAMINE 30 MG/ML IJ SOLN
INTRAMUSCULAR | Status: DC | PRN
  Administered 2020-09-18: 30 mg via INTRAVENOUS

## 2020-09-18 MED ORDER — HYDROMORPHONE HCL 1 MG/ML IJ SOLN
INTRAMUSCULAR | Status: AC
  Filled 2020-09-18: qty 1

## 2020-09-18 MED ORDER — ROCURONIUM BROMIDE 100 MG/10ML IV SOLN
INTRAVENOUS | Status: DC | PRN
  Administered 2020-09-18: 10 mg via INTRAVENOUS
  Administered 2020-09-18: 50 mg via INTRAVENOUS

## 2020-09-18 MED ORDER — ONDANSETRON HCL 4 MG/2ML IJ SOLN
INTRAMUSCULAR | Status: DC | PRN
  Administered 2020-09-18: 4 mg via INTRAVENOUS

## 2020-09-18 MED ORDER — HYDROMORPHONE HCL 1 MG/ML IJ SOLN
0.2500 mg | INTRAMUSCULAR | Status: DC | PRN
  Administered 2020-09-18 (×2): 0.5 mg via INTRAVENOUS

## 2020-09-18 MED ORDER — MIDAZOLAM HCL 2 MG/2ML IJ SOLN
INTRAMUSCULAR | Status: AC
  Filled 2020-09-18: qty 2

## 2020-09-18 MED ORDER — POVIDONE-IODINE 10 % EX SWAB: 2.0000 "application " | Freq: Once | CUTANEOUS | Status: DC

## 2020-09-18 MED ORDER — SUGAMMADEX SODIUM 200 MG/2ML IV SOLN
INTRAVENOUS | Status: DC | PRN
  Administered 2020-09-18: 200 mg via INTRAVENOUS

## 2020-09-18 MED ORDER — ACETAMINOPHEN 500 MG PO TABS
ORAL_TABLET | ORAL | Status: AC
  Filled 2020-09-18: qty 2

## 2020-09-18 MED ORDER — ACETAMINOPHEN 500 MG PO TABS
1000.0000 mg | ORAL_TABLET | Freq: Once | ORAL | Status: AC
  Administered 2020-09-18: 1000 mg via ORAL

## 2020-09-18 MED ORDER — OXYCODONE-ACETAMINOPHEN 5-325 MG PO TABS: 1.0000 | ORAL_TABLET | Freq: Four times a day (QID) | ORAL | 0 refills | Status: DC | PRN

## 2020-09-18 MED ORDER — BUPIVACAINE HCL (PF) 0.25 % IJ SOLN
INTRAMUSCULAR | Status: DC | PRN
  Administered 2020-09-18: 10 mL

## 2020-09-18 MED ORDER — GLYCOPYRROLATE 0.2 MG/ML IJ SOLN
INTRAMUSCULAR | Status: DC | PRN
  Administered 2020-09-18: .1 mg via INTRAVENOUS

## 2020-09-18 MED ORDER — PROMETHAZINE HCL 25 MG/ML IJ SOLN
6.2500 mg | Freq: Once | INTRAMUSCULAR | Status: AC
  Administered 2020-09-18: 6.25 mg via INTRAVENOUS

## 2020-09-18 MED ORDER — EPHEDRINE SULFATE 50 MG/ML IJ SOLN
INTRAMUSCULAR | Status: DC | PRN
  Administered 2020-09-18: 10 mg via INTRAVENOUS

## 2020-09-18 MED ORDER — MIDAZOLAM HCL 2 MG/2ML IJ SOLN
INTRAMUSCULAR | Status: DC | PRN
  Administered 2020-09-18: 2 mg via INTRAVENOUS

## 2020-09-18 MED ORDER — IBUPROFEN 800 MG PO TABS: 800.0000 mg | ORAL_TABLET | Freq: Three times a day (TID) | ORAL | 1 refills | Status: DC | PRN

## 2020-09-18 MED ORDER — FENTANYL CITRATE (PF) 100 MCG/2ML IJ SOLN
INTRAMUSCULAR | Status: DC | PRN
  Administered 2020-09-18 (×2): 50 ug via INTRAVENOUS

## 2020-09-18 MED ORDER — KETOROLAC TROMETHAMINE 30 MG/ML IJ SOLN
INTRAMUSCULAR | Status: AC
  Filled 2020-09-18: qty 1

## 2020-09-18 MED ORDER — HEPARIN SODIUM (PORCINE) 5000 UNIT/ML IJ SOLN: 5000.0000 [IU] | Freq: Once | INTRAMUSCULAR | Status: DC

## 2020-09-18 MED ORDER — ONDANSETRON HCL 4 MG/2ML IJ SOLN
INTRAMUSCULAR | Status: AC
  Filled 2020-09-18: qty 2

## 2020-09-18 MED ORDER — LIDOCAINE HCL (CARDIAC) PF 100 MG/5ML IV SOSY
PREFILLED_SYRINGE | INTRAVENOUS | Status: DC | PRN
  Administered 2020-09-18: 80 mg via INTRAVENOUS

## 2020-09-18 MED ORDER — DEXAMETHASONE SODIUM PHOSPHATE 4 MG/ML IJ SOLN
INTRAMUSCULAR | Status: DC | PRN
  Administered 2020-09-18: 8 mg via INTRAVENOUS

## 2020-09-18 MED ORDER — SODIUM CHLORIDE 0.9 % IR SOLN
Status: DC | PRN
  Administered 2020-09-18: 3000 mL

## 2020-09-18 MED ORDER — FENTANYL CITRATE (PF) 250 MCG/5ML IJ SOLN
INTRAMUSCULAR | Status: AC
  Filled 2020-09-18: qty 5

## 2020-09-18 MED ORDER — PROMETHAZINE HCL 25 MG/ML IJ SOLN
INTRAMUSCULAR | Status: AC
  Filled 2020-09-18: qty 1

## 2020-09-18 SURGICAL SUPPLY — 44 items
ADH SKN CLS APL DERMABOND .7 (GAUZE/BANDAGES/DRESSINGS) ×1
APL SRG 38 LTWT LNG FL B (MISCELLANEOUS) ×1
APPLICATOR ARISTA FLEXITIP XL (MISCELLANEOUS) ×2 IMPLANT
BAG SPEC RTRVL LRG 6X4 10 (ENDOMECHANICALS)
CABLE HIGH FREQUENCY MONO STRZ (ELECTRODE) IMPLANT
COVER WAND RF STERILE (DRAPES) ×2 IMPLANT
DECANTER SPIKE VIAL GLASS SM (MISCELLANEOUS) ×2 IMPLANT
DERMABOND ADVANCED (GAUZE/BANDAGES/DRESSINGS) ×1
DERMABOND ADVANCED .7 DNX12 (GAUZE/BANDAGES/DRESSINGS) ×1 IMPLANT
DRSG OPSITE POSTOP 3X4 (GAUZE/BANDAGES/DRESSINGS) ×2 IMPLANT
DURAPREP 26ML APPLICATOR (WOUND CARE) ×2 IMPLANT
ELECT REM PT RETURN 9FT ADLT (ELECTROSURGICAL) ×2
ELECTRODE REM PT RTRN 9FT ADLT (ELECTROSURGICAL) ×1 IMPLANT
FORCEPS CUTTING 33CM 5MM (CUTTING FORCEPS) IMPLANT
GLOVE SURG ENC MOIS LTX SZ6.5 (GLOVE) ×2 IMPLANT
GLOVE SURG UNDER POLY LF SZ7 (GLOVE) ×8 IMPLANT
GOWN STRL REUS W/ TWL LRG LVL3 (GOWN DISPOSABLE) ×2 IMPLANT
GOWN STRL REUS W/TWL LRG LVL3 (GOWN DISPOSABLE) ×4
HEMOSTAT ARISTA ABSORB 3G PWDR (HEMOSTASIS) ×2 IMPLANT
KIT TURNOVER CYSTO (KITS) ×2 IMPLANT
MANIPULATOR UTERINE 4.5 ZUMI (MISCELLANEOUS) ×2 IMPLANT
PACK LAPAROSCOPY BASIN (CUSTOM PROCEDURE TRAY) ×2 IMPLANT
PACK TRENDGUARD 450 HYBRID PRO (MISCELLANEOUS) IMPLANT
PENCIL SMOKE EVACUATOR (MISCELLANEOUS) IMPLANT
POUCH SPECIMEN RETRIEVAL 10MM (ENDOMECHANICALS) IMPLANT
PROTECTOR NERVE ULNAR (MISCELLANEOUS) ×4 IMPLANT
SCISSORS LAP 5X35 DISP (ENDOMECHANICALS) IMPLANT
SET SUCTION IRRIG HYDROSURG (IRRIGATION / IRRIGATOR) IMPLANT
SET TUBE SMOKE EVAC HIGH FLOW (TUBING) ×2 IMPLANT
SHEARS HARMONIC ACE PLUS 36CM (ENDOMECHANICALS) ×2 IMPLANT
SOLUTION ELECTROLUBE (MISCELLANEOUS) IMPLANT
SUT VIC AB 4-0 PS2 18 (SUTURE) ×2 IMPLANT
SUT VIC AB 4-0 PS2 27 (SUTURE) ×2 IMPLANT
SUT VICRYL 0 UR6 27IN ABS (SUTURE) ×2 IMPLANT
SYR 10ML LL (SYRINGE) ×4 IMPLANT
SYS RETRIEVAL 5MM INZII UNIV (BASKET) ×2
SYSTEM RETRIEVL 5MM INZII UNIV (BASKET) ×1 IMPLANT
TRAY FOLEY W/BAG SLVR 14FR LF (SET/KITS/TRAYS/PACK) ×2 IMPLANT
TRENDGUARD 450 HYBRID PRO PACK (MISCELLANEOUS)
TROCAR BALLN 12MMX100 BLUNT (TROCAR) ×2 IMPLANT
TROCAR BLADELESS OPT 5 100 (ENDOMECHANICALS) ×6 IMPLANT
TROCAR XCEL NON BLADE 8MM B8LT (ENDOMECHANICALS) ×2 IMPLANT
TROCAR XCEL NON-BLD 11X100MML (ENDOMECHANICALS) IMPLANT
WARMER LAPAROSCOPE (MISCELLANEOUS) ×2 IMPLANT

## 2020-09-18 NOTE — Brief Op Note (Signed)
09/18/2020  3:46 PM  PATIENT:  Caroline [name]  60 y.o. female  PRE-OPERATIVE DIAGNOSIS:  Right Ovarian Cyst  POST-OPERATIVE DIAGNOSIS:  Right Ovarian Cyst  PROCEDURE:  Procedure(s): LAPAROSCOPIC SALPINGO OOPHORECTOMY (Right)  SURGEON:  Surgeon(s) and Role:    Aloha Gell, MD - Primary think is a couple grams a year PHYSICIAN ASSISTANT:   ASSISTANTS: Vaishaili Mod area, MD  ANESTHESIA:   local and general  EBL:  50 mL   BLOOD ADMINISTERED:none  DRAINS: Urinary Catheter (Foley)   LOCAL MEDICATIONS USED:  MARCAINE     SPECIMEN:  Source of Specimen:  Right ovary, tube and cyst  DISPOSITION OF SPECIMEN:  PATHOLOGY  COUNTS:  YES  TOURNIQUET:  * No tourniquets in log *  DICTATION: .Note written in EPIC  PLAN OF CARE: Discharge to home after PACU  PATIENT DISPOSITION:  PACU - hemodynamically stable.   Delay start of Pharmacological VTE agent (>24hrs) due to surgical blood loss or risk of bleeding: yes

## 2020-09-18 NOTE — Anesthesia Postprocedure Evaluation (Signed)
Anesthesia Post Note  Patient: Caroline [name]  Procedure(s) Performed: LAPAROSCOPIC SALPINGO OOPHORECTOMY (Right )     Patient location during evaluation: PACU Anesthesia Type: General Level of consciousness: awake Pain management: pain level controlled Vital Signs Assessment: post-procedure vital signs reviewed and stable Respiratory status: spontaneous breathing Cardiovascular status: stable Postop Assessment: no apparent nausea or vomiting Anesthetic complications: no   No complications documented.  Last Vitals:  Vitals:   09/18/20 1141 09/18/20 1550  BP: 131/78   Pulse: 76   Resp: 16   Temp: 36.7 C 36.6 C  SpO2: 96%     Last Pain:  Vitals:   09/18/20 1550  TempSrc:   PainSc: 6                  Isak Sotomayor

## 2020-09-18 NOTE — Op Note (Signed)
09/18/2020  3:46 PM  PATIENT:  Caroline [name]  60 y.o. female  PRE-OPERATIVE DIAGNOSIS:  Right Ovarian Cyst  POST-OPERATIVE DIAGNOSIS:  Right Ovarian Cyst  PROCEDURE:  Procedure(s): LAPAROSCOPIC SALPINGO OOPHORECTOMY (Right)  SURGEON:  Surgeon(s) and Role:    Aloha Gell, MD - Primary think is a couple grams a year PHYSICIAN ASSISTANT:   ASSISTANTS: Vaishaili Mod area, MD  ANESTHESIA:   local and general  EBL:  50 mL   BLOOD ADMINISTERED:none  DRAINS: Urinary Catheter (Foley)   LOCAL MEDICATIONS USED:  MARCAINE     SPECIMEN:  Source of Specimen:  Right ovary, tube and cyst  DISPOSITION OF SPECIMEN:  PATHOLOGY  COUNTS:  YES  TOURNIQUET:  * No tourniquets in log *  DICTATION: .Note written in EPIC  PLAN OF CARE: Discharge to home after PACU  PATIENT DISPOSITION:  PACU - hemodynamically stable.   Delay start of Pharmacological VTE agent (>24hrs) due to surgical blood loss or risk of bleeding: yes    Antibiotics: None Complications: None  INDICATIONS: 60 year old female with persistent simple 5-1/2 cm right adnexal cyst, symptomatic.  Risks of procedure discussed with patient including bleeding, infection, damage to surrounding organs.     FINDINGS: 5-1/2 cm simple right adnexal cyst between the right ovary and the right infundibulopelvic ligament in the mesosalpinx, right fallopian tube involved in lesion.  Cervix was grasped with  TECHNIQUE:  The patient was taken to the operating room where general anesthesia was obtained without difficulty.  She was then placed in the dorsal lithotomy position and prepared and draped in sterile fashion.  After an adequate timeout was performed, a foley catheter was placed and  a bimanual exam was done to assess the size and position of the uterus. A bivalved speculum was then placed in the patient's vagina, and the anterior lip of cervix grasped with the Hulka tenaculum.  Attention was then turned to the patient's abdomen  where a 5-mm skin incision was made on the umbilical fold after first injecting with quarter percent Marcaine.  A 5 mm camera was placed through an Optiview trocar which was advanced into the peritoneal cavity without difficulty.  Adequate pneumoperitoneum was obtained, and a brief survey of the patient's pelvis and abdomen revealed findings as above.  5 mm skin incisions were made in the left and right lower quadrant after injecting with anasthetic. Non-bladed trocars were placed under direct visualization. Using blunt and grasping instruments, anatomy was surveyed with findings as above.  We started by evaluating the right ureter.  The right ovary was found to be normal but the right adnexal cyst was approaching the infundibulopelvic ligament and the decision was made to remove the right ovary and cyst as 1 specimen.  Starting at the proximal end of the cyst was deviated medially to place the right IP on stretch.  The right IP was cauterized with the harmonic scalpel and divided.  We then went medially and cauterized and divided the right utero-ovarian ligament and continued division along the mesosalpinx until we met the right IP.  The specimen was removed and placed in the posterior cul-de-sac.  A 10 mm trocar was used to replace the 5 mm umbilical trocar.  Using a 5 mm Endo Catch through the umbilicus the specimen was placed into the Endo Catch bag and pulled out through the umbilical incision.  The bag did not pull through the small umbilical incision but instead the bag was opened and the specimen was removed from the bag  and several pieces.  Trochars were replaced and a small amount of bleeding was noted from the right pedicle.  The harmonic scalpel was used to control hemostasis.  Suction irrigation was carried out.  Once hemostasis was assured all free fluid was removed from the pelvis and Arista was placed over the cut edges.  Pneumoperitoneum removed, sites still hemostatic. Instruments and  ports removed.   Umbilical fascial incision was not closed due to small nature of the incision.   4-0 vicryl used for skin incisions. Dermabond applied by nurse.   Hulka tenaculum removed, cervix was hemostatic catheter removed by nurse before moving to the recovery room  The patient tolerated the procedure well.  Sponge, lap, and needle counts were correct times two.  The patient was then taken to the recovery room awake, extubated and  in stable condition.  [name] A. 08/14/2013 12:19 PM

## 2020-09-18 NOTE — H&P (Signed)
Chief complaint right ovarian cyst  History of present illness 60 year old menopausal patient who first presented with low back pain and right lower quadrant pain back in June 2021.  In the ER she had a CT scan and then an ultrasound which showed a 5.7 cm simple cyst in the right pelvis likely stemming from the right ovary.  Follow-up MRI was consistent with the prior findings.  Patient presented to my office for a consult.  Exam was overall benign and follow-up ultrasound October 2021 showed right ovarian simple cystic mass 5.1 x 4.7 x 5.2 cm.  No increased blood flow.  Normal left ovary normal uterus  Patient's family history is also significant for family history of breast cancer and patient has done genetic testing which revealed normal [employer] hereditary cancer screening.  Past medical history is significant for hypertension hypercholesterolemia and prior MI.  She has had medical clearance from her cardiologist and her primary care physician.  Past surgical history exploratory laparotomy for cholecystectomy and appendectomy and separate surgeries Past Surgical History:  Procedure Laterality Date  . APPENDECTOMY  1976  . BREAST EXCISIONAL BIOPSY Right    benign  . BREAST SURGERY Right 2002   biopsy  . CARDIAC CATHETERIZATION  2011  . CARDIAC CATHETERIZATION  Sept 2012   With documented vasospasm but with residual disease; managed medically  . CHOLECYSTECTOMY  1982   open  . LEFT HEART CATHETERIZATION WITH CORONARY ANGIOGRAM N/A 09/14/2011   Procedure: LEFT HEART CATHETERIZATION WITH CORONARY ANGIOGRAM;  Surgeon: Burnell Blanks, MD;  Location: [facility] CATH LAB;  Service: Cardiovascular;  Laterality: N/A;  . UPPER GASTROINTESTINAL ENDOSCOPY  01/11/2011   esophagitis    Past Medical History:  Diagnosis Date  . CAD (coronary artery disease)    has documented coronary vasospasm but with underlying 40% prox LAD, 30% prox LCX and 70 to 80% proximal 1st DX and 30% RCA; recurrent vasospasm  with subsequent cath in Jan 2013.   Marland Kitchen Coronary vasospasm ([place])   . Environmental and seasonal allergies   . Esophagitis   . GERD (gastroesophageal reflux disease)   . Hyperlipidemia   . Hypertension   . Hypothyroidism   . IBS (irritable bowel syndrome)   . Palpitations    tachycardia on holter; treated with beta blockers  . Right ovarian cyst     Allergies: No known drug allergies  Physical exam Vitals:   09/18/20 1141  BP: 131/78  Pulse: 76  Resp: 16  Temp: 98 F (36.7 C)  SpO2: 96%   General: Well-appearing no distress Abdomen: Obese, prior surgical incisions, none tender GU: Deferred to the OR Lower extremity: Nontender, no edema  CBC    Component Value Date/Time   WBC 9.1 09/10/2020 1509   RBC 4.56 09/10/2020 1509   HGB 14.9 09/10/2020 1509   HGB 14.9 03/13/2019 1411   HCT 44.2 09/10/2020 1509   HCT 45.4 03/13/2019 1411   PLT 197.0 09/10/2020 1509   PLT 207 03/13/2019 1411   MCV 97.0 09/10/2020 1509   MCV 98 (H) 03/13/2019 1411   MCH 32.4 02/18/2020 1941   MCHC 33.8 09/10/2020 1509   RDW 14.0 09/10/2020 1509   RDW 12.8 03/13/2019 1411   LYMPHSABS 1.5 02/21/2020 0952   MONOABS 0.7 02/21/2020 0952   EOSABS 0.4 02/21/2020 0952   BASOSABS 0.0 02/21/2020 0952     Assessment and plan: 60 year old menopausal patient with new finding of 5-1/2 cm right adnexal cyst, simple, most likely arising from the right ovary.  Patient  is for laparoscopic right oophorectomy versus right cystectomy depending on the intraoperative findings.  Risk benefits and alternatives to the procedure discussed with patient.  Patient is consented for blood transfusion if necessary.  Ala Dach 09/18/2020 1:24 PM

## 2020-09-18 NOTE — Discharge Instructions (Signed)
Post Anesthesia Home Care Instructions  Activity: Get plenty of rest for the remainder of the day. A responsible adult should stay with you for 24 hours following the procedure.  For the next 24 hours, DO NOT: -Drive a car -Operate machinery -Drink alcoholic beverages -Take any medication unless instructed by your physician -Make any legal decisions or sign important papers.  Meals: Start with liquid foods such as gelatin or soup. Progress to regular foods as tolerated. Avoid greasy, spicy, heavy foods. If nausea and/or vomiting occur, drink only clear liquids until the nausea and/or vomiting subsides. Call your physician if vomiting continues.  Special Instructions/Symptoms: Your throat may feel dry or sore from the anesthesia or the breathing tube placed in your throat during surgery. If this causes discomfort, gargle with warm salt water. The discomfort should disappear within 24 hours.  If you had a scopolamine patch placed behind your ear for the management of post- operative nausea and/or vomiting:  1. The medication in the patch is effective for 72 hours, after which it should be removed.  Wrap patch in a tissue and discard in the trash. Wash hands thoroughly with soap and water. 2. You may remove the patch earlier than 72 hours if you experience unpleasant side effects which may include dry mouth, dizziness or visual disturbances. 3. Avoid touching the patch. Wash your hands with soap and water after contact with the patch.   DISCHARGE INSTRUCTIONS: Laparoscopy  The following instructions have been prepared to help you care for yourself upon your return home today.  Wound care: . Do not get the incision wet for the first 24 hours. The incision should be kept clean and dry. . The Band-Aids or dressings may be removed the day after surgery. . Should the incision become sore, red, and swollen after the first week, check with your doctor.  Personal hygiene: . Shower the day  after your procedure.  Activity and limitations: . Do NOT drive or operate any equipment today. . Do NOT lift anything more than 15 pounds for 2-3 weeks after surgery. . Do NOT rest in bed all day. . Walking is encouraged. Walk each day, starting slowly with 5-minute walks 3 or 4 times a day. Slowly increase the length of your walks. . Walk up and down stairs slowly. . Do NOT do strenuous activities, such as golfing, playing tennis, bowling, running, biking, weight lifting, gardening, mowing, or vacuuming for 2-4 weeks. Ask your doctor when it is okay to start.  Diet: Eat a light meal as desired this evening. You may resume your usual diet tomorrow.  Return to work: This is dependent on the type of work you do. For the most part you can return to a desk job within a week of surgery. If you are more active at work, please discuss this with your doctor.  What to expect after your surgery: You may have a slight burning sensation when you urinate on the first day. You may have a very small amount of blood in the urine. Expect to have a small amount of vaginal discharge/light bleeding for 1-2 weeks. It is not unusual to have abdominal soreness and bruising for up to 2 weeks. You may be tired and need more rest for about 1 week. You may experience shoulder pain for 24-72 hours. Lying flat in bed may relieve it.  Call your doctor for any of the following: . Develop a fever of 100.4 or greater . Inability to urinate 6 hours after discharge   from hospital . Severe pain not relieved by pain medications . Persistent of heavy bleeding at incision site . Redness or swelling around incision site after a week . Increasing nausea or vomiting  Patient Signature________________________________________ Nurse Signature_________________________________________ 

## 2020-09-18 NOTE — Anesthesia Preprocedure Evaluation (Addendum)
Anesthesia Evaluation  Patient identified by MRN, date of birth, ID band Patient awake    Reviewed: Allergy & Precautions, H&P , NPO status , Patient's Chart, lab work & pertinent test results  Airway Mallampati: II  TM Distance: >3 FB Neck ROM: Full    Dental no notable dental hx. (+) Teeth Intact, Dental Advisory Given   Pulmonary asthma , former smoker,    Pulmonary exam normal breath sounds clear to auscultation       Cardiovascular hypertension, Pt. on medications + CAD   Rhythm:Regular Rate:Normal     Neuro/Psych Anxiety negative neurological ROS     GI/Hepatic Neg liver ROS, GERD  ,  Endo/Other  Hypothyroidism   Renal/GU negative Renal ROS  negative genitourinary   Musculoskeletal  (+) Arthritis , Osteoarthritis,    Abdominal   Peds  Hematology negative hematology ROS (+)   Anesthesia Other Findings   Reproductive/Obstetrics negative OB ROS                            Anesthesia Physical Anesthesia Plan  ASA: III  Anesthesia Plan: General   Post-op Pain Management:    Induction: Intravenous  PONV Risk Score and Plan: 4 or greater and Ondansetron, Dexamethasone and Midazolam  Airway Management Planned: Oral ETT  Additional Equipment:   Intra-op Plan:   Post-operative Plan: Extubation in OR  Informed Consent: I have reviewed the patients History and Physical, chart, labs and discussed the procedure including the risks, benefits and alternatives for the proposed anesthesia with the patient or authorized representative who has indicated his/her understanding and acceptance.     Dental advisory given  Plan Discussed with: CRNA  Anesthesia Plan Comments:         Anesthesia Quick Evaluation

## 2020-09-18 NOTE — Anesthesia Procedure Notes (Signed)
Procedure Name: Intubation Date/Time: 09/18/2020 2:05 PM Performed by: Georgeanne Nim, CRNA Pre-anesthesia Checklist: Patient identified, Emergency Drugs available, Suction available, Patient being monitored and Timeout performed Patient Re-evaluated:Patient Re-evaluated prior to induction Oxygen Delivery Method: Circle system utilized Preoxygenation: Pre-oxygenation with 100% oxygen Induction Type: IV induction Ventilation: Mask ventilation without difficulty Grade View: Grade I Tube type: Oral Tube size: 7.0 mm Number of attempts: 1 Airway Equipment and Method: Stylet Placement Confirmation: ETT inserted through vocal cords under direct vision,  positive ETCO2,  CO2 detector and breath sounds checked- equal and bilateral Secured at: 21 cm Tube secured with: Tape Dental Injury: Teeth and Oropharynx as per pre-operative assessment

## 2020-09-18 NOTE — Transfer of Care (Signed)
Immediate Anesthesia Transfer of Care Note  Patient: Caroline [name]  Procedure(s) Performed: LAPAROSCOPIC SALPINGO OOPHORECTOMY (Right )  Patient Location: PACU  Anesthesia Type:General  Level of Consciousness: drowsy  Airway & Oxygen Therapy: Patient Spontanous Breathing and Patient connected to nasal cannula oxygen  Post-op Assessment: Report given to RN and Post -op Vital signs reviewed and stable  Post vital signs: Reviewed and stable  Last Vitals:  Vitals Value Taken Time  BP    Temp    Pulse 63 09/18/20 1550  Resp 14 09/18/20 1550  SpO2 100 % 09/18/20 1550  Vitals shown include unvalidated device data.  Last Pain:  Vitals:   09/18/20 1141  TempSrc: Oral         Complications: No complications documented.

## 2020-09-19 ENCOUNTER — Encounter (HOSPITAL_BASED_OUTPATIENT_CLINIC_OR_DEPARTMENT_OTHER): Payer: Self-pay | Admitting: Obstetrics

## 2020-09-22 LAB — SURGICAL PATHOLOGY

## 2020-10-01 ENCOUNTER — Other Ambulatory Visit: Payer: Self-pay | Admitting: Primary Care

## 2020-10-01 DIAGNOSIS — E039 Hypothyroidism, unspecified: Secondary | ICD-10-CM

## 2020-10-24 ENCOUNTER — Ambulatory Visit: Payer: BC Managed Care – PPO | Admitting: Family Medicine

## 2020-10-24 ENCOUNTER — Other Ambulatory Visit: Payer: Self-pay

## 2020-10-24 ENCOUNTER — Encounter: Payer: Self-pay | Admitting: Family Medicine

## 2020-10-24 VITALS — BP 140/66 | HR 83 | Temp 97.8°F | Ht 64.0 in | Wt 180.0 lb

## 2020-10-24 DIAGNOSIS — M436 Torticollis: Secondary | ICD-10-CM | POA: Diagnosis not present

## 2020-10-24 DIAGNOSIS — J45909 Unspecified asthma, uncomplicated: Secondary | ICD-10-CM

## 2020-10-24 DIAGNOSIS — R6883 Chills (without fever): Secondary | ICD-10-CM | POA: Diagnosis not present

## 2020-10-24 DIAGNOSIS — M791 Myalgia, unspecified site: Secondary | ICD-10-CM

## 2020-10-24 LAB — CBC WITH DIFFERENTIAL/PLATELET
Basophils Absolute: 0 10*3/uL (ref 0.0–0.1)
Basophils Relative: 0.6 % (ref 0.0–3.0)
Eosinophils Absolute: 0.2 10*3/uL (ref 0.0–0.7)
Eosinophils Relative: 2.7 % (ref 0.0–5.0)
HCT: 42.6 % (ref 36.0–46.0)
Hemoglobin: 14.5 g/dL (ref 12.0–15.0)
Lymphocytes Relative: 25.3 % (ref 12.0–46.0)
Lymphs Abs: 2 10*3/uL (ref 0.7–4.0)
MCHC: 34.2 g/dL (ref 30.0–36.0)
MCV: 95.9 fl (ref 78.0–100.0)
Monocytes Absolute: 0.5 10*3/uL (ref 0.1–1.0)
Monocytes Relative: 6.9 % (ref 3.0–12.0)
Neutro Abs: 5.2 10*3/uL (ref 1.4–7.7)
Neutrophils Relative %: 64.5 % (ref 43.0–77.0)
Platelets: 186 10*3/uL (ref 150.0–400.0)
RBC: 4.44 Mil/uL (ref 3.87–5.11)
RDW: 14.2 % (ref 11.5–15.5)
WBC: 8 10*3/uL (ref 4.0–10.5)

## 2020-10-24 LAB — POC URINALSYSI DIPSTICK (AUTOMATED)
Bilirubin, UA: NEGATIVE
Blood, UA: NEGATIVE
Glucose, UA: NEGATIVE
Ketones, UA: NEGATIVE
Leukocytes, UA: NEGATIVE
Nitrite, UA: NEGATIVE
Protein, UA: NEGATIVE
Spec Grav, UA: 1.01 (ref 1.010–1.025)
Urobilinogen, UA: 0.2 E.U./dL
pH, UA: 6 (ref 5.0–8.0)

## 2020-10-24 LAB — COMPREHENSIVE METABOLIC PANEL
ALT: 22 U/L (ref 0–35)
AST: 21 U/L (ref 0–37)
Albumin: 4.4 g/dL (ref 3.5–5.2)
Alkaline Phosphatase: 78 U/L (ref 39–117)
BUN: 12 mg/dL (ref 6–23)
CO2: 30 mEq/L (ref 19–32)
Calcium: 9.5 mg/dL (ref 8.4–10.5)
Chloride: 99 mEq/L (ref 96–112)
Creatinine, Ser: 0.69 mg/dL (ref 0.40–1.20)
GFR: 93.8 mL/min (ref >60.00)
Glucose, Bld: 104 mg/dL — ABNORMAL HIGH (ref 70–99)
Potassium: 4.1 mEq/L (ref 3.5–5.1)
Sodium: 138 mEq/L (ref 135–145)
Total Bilirubin: 0.4 mg/dL (ref 0.2–1.2)
Total Protein: 7.8 g/dL (ref 6.0–8.3)

## 2020-10-24 LAB — POC INFLUENZA A&B (BINAX/QUICKVUE)
Influenza A, POC: NEGATIVE
Influenza B, POC: NEGATIVE

## 2020-10-24 MED ORDER — ALBUTEROL SULFATE HFA 108 (90 BASE) MCG/ACT IN AERS: 2.0000 | INHALATION_SPRAY | Freq: Four times a day (QID) | RESPIRATORY_TRACT | 0 refills | Status: DC | PRN

## 2020-10-24 NOTE — Patient Instructions (Signed)
We  will call with labs results as they return.  Increase water intake. Can use ibuprofen or tylenol as needed for neck ache and body ache.  Go to ER if symptoms worsening to severe or if not able to keep down liquids.

## 2020-10-24 NOTE — Progress Notes (Signed)
Patient ID: Caroline [name], female    DOB: March 12, 1961, 60 y.o.   MRN: [id number]  This visit was conducted in person.  BP 140/66   Pulse 83   Temp 97.8 F (36.6 C) (Temporal)   Ht 5\' 4"  (1.626 m)   Wt 180 lb (81.6 kg)   LMP 07/28/2010   SpO2 96%   BMI 30.90 kg/m    CC:  Chief Complaint  Patient presents with  . Neck Pain    Started 10 days ago  . Headache  . Chills  . Back Pain    Lower back pain    . Fatigue  . Blurred Vision    Subjective:   HPI: Caroline [name] is a 60 y.o. female  with history of CAD, HTN, asthma, hypothyroidism, prediabetes and high cholesterol presenting on 10/24/2020 for Neck Pain (Started 10 days ago), Headache, Chills, Back Pain (Lower back pain//), Fatigue, and Blurred Vision   PCP Allie Bossier  She reports   starting on 2/15 she awoke with stiffness and pain in right neck. Took tylenol helped some.  Started with headache and pain moved to left side of neck.  Now in low back base is sore in last 24 hours  No rash.  No fever but in last 24 hours she feels vision is blurry off and on, chills off and on.  Awoke on morning with excessive sweating.  Intermittent nausea but no emesis, occ diarrhea off and on associated with abd cramping off and on.  No dysuria, no urinary.  She is feeling very tired. Having myalgia.. feels flu like.  Has mild occ cough, clear productive cough.... dogs are shedding now and she has asthma.  No ST,  No new nasal congestion.  He son got over [place] 3 weeks ago.. had three home tests negative over 4 days. Last test she took was negative on 10/19/20.  She has gotten COVID vaccine x 3.    Recent  Right Oopherectomy 09/18/20.. for mass on ovary, benign on pathology. Mild redness at surgical site.   Relevant past medical, surgical, family and social history reviewed and updated as indicated. Interim medical history since our last visit reviewed. Allergies and medications reviewed and updated. Outpatient Medications  Prior to Visit  Medication Sig Dispense Refill  . acetaminophen (TYLENOL) 500 MG tablet Take 500 mg by mouth every 6 (six) hours as needed. For pain    . aspirin 81 MG tablet Take 81 mg by mouth daily.    Marland Kitchen atorvastatin (LIPITOR) 20 MG tablet Take 1 tablet (20 mg total) by mouth daily. 90 tablet 3  . Cholecalciferol (VITAMIN D) 2000 units CAPS Take 2000 units once per day. 30 capsule   . diclofenac Sodium (VOLTAREN) 1 % GEL Apply 2-4 g topically 4 (four) times daily. 200 g 2  . diltiazem (TIAZAC) 300 MG 24 hr capsule TAKE 1 CAPSULE BY MOUTH EVERY DAY 90 capsule 3  . fluticasone (FLONASE) 50 MCG/ACT nasal spray Place 2 sprays into both nostrils daily. 16 g 6  . ibuprofen (ADVIL) 800 MG tablet Take 1 tablet (800 mg total) by mouth every 8 (eight) hours as needed. 60 tablet 1  . isosorbide dinitrate (ISORDIL) 30 MG tablet TAKE 1 TABLET BY MOUTH 3 TIMES DAILY. 270 tablet 3  . levocetirizine (XYZAL) 5 MG tablet Take 1 tablet (5 mg total) by mouth at bedtime as needed for allergies. 90 tablet 3  . levothyroxine (SYNTHROID) 50 MCG tablet TAKE 1 TABLET BY MOUTH EVERY  DAY IN THE MORNING ON AN EMPTY STOMACH 90 tablet 1  . nitroGLYCERIN (NITROSTAT) 0.4 MG SL tablet Place 1 tablet (0.4 mg total) under the tongue every 5 (five) minutes as needed for chest pain. X 3 doses 25 tablet 3  . ondansetron (ZOFRAN ODT) 4 MG disintegrating tablet Take 1 tablet (4 mg total) by mouth every 8 (eight) hours as needed. 20 tablet 0  . oxyCODONE-acetaminophen (PERCOCET) 5-325 MG tablet Take 1-2 tablets by mouth every 6 (six) hours as needed for severe pain. 30 tablet 0  . sertraline (ZOLOFT) 50 MG tablet TAKE 1 TABLET BY MOUTH EVERYDAY AT BEDTIME for anxiety. 90 tablet 3  . VENTOLIN HFA 108 (90 Base) MCG/ACT inhaler INHALE 2 PUFFS BY MOUTH EVERY 6 HOURS AS NEEDED 18 g 0   No facility-administered medications prior to visit.     Per HPI unless specifically indicated in ROS section below Review of Systems  All other systems  reviewed and are negative.  Objective:  BP 140/66   Pulse 83   Temp 97.8 F (36.6 C) (Temporal)   Ht 5\' 4"  (1.626 m)   Wt 180 lb (81.6 kg)   LMP 07/28/2010   SpO2 96%   BMI 30.90 kg/m   Wt Readings from Last 3 Encounters:  10/24/20 180 lb (81.6 kg)  09/18/20 179 lb 3.2 oz (81.3 kg)  09/10/20 182 lb (82.6 kg)      Physical Exam Constitutional:      General: She is not in acute distress.    Appearance: Normal appearance. She is well-developed. She is not ill-appearing or toxic-appearing.  HENT:     Head: Normocephalic.     Right Ear: Hearing, tympanic membrane, ear canal and external ear normal. Tympanic membrane is not erythematous, retracted or bulging.     Left Ear: Hearing, tympanic membrane, ear canal and external ear normal. Tympanic membrane is not erythematous, retracted or bulging.     Nose: No mucosal edema or rhinorrhea.     Right Sinus: No maxillary sinus tenderness or frontal sinus tenderness.     Left Sinus: No maxillary sinus tenderness or frontal sinus tenderness.     Mouth/Throat:     Pharynx: Uvula midline.  Eyes:     General: Lids are normal. Lids are everted, no foreign bodies appreciated.     Conjunctiva/sclera: Conjunctivae normal.     Pupils: Pupils are equal, round, and reactive to light.  Neck:     Thyroid: No thyroid mass or thyromegaly.     Vascular: No carotid bruit.     Trachea: Trachea normal.  Cardiovascular:     Rate and Rhythm: Normal rate and regular rhythm.     Pulses: Normal pulses.     Heart sounds: Normal heart sounds, S1 normal and S2 normal. No murmur heard. No friction rub. No gallop.   Pulmonary:     Effort: Pulmonary effort is normal. No tachypnea or respiratory distress.     Breath sounds: Normal breath sounds. No decreased breath sounds, wheezing, rhonchi or rales.  Abdominal:     General: Bowel sounds are normal.     Palpations: Abdomen is soft.     Tenderness: There is no abdominal tenderness.  Musculoskeletal:      Cervical back: Normal range of motion and neck supple.  Skin:    General: Skin is warm and dry.     Findings: No rash.  Neurological:     Mental Status: She is alert.  Psychiatric:  Mood and Affect: Mood is not anxious or depressed.        Speech: Speech normal.        Behavior: Behavior normal. Behavior is cooperative.        Thought Content: Thought content normal.        Judgment: Judgment normal.       Results for orders placed or performed during the hospital encounter of 09/18/20  Type and screen  Result Value Ref Range   ABO/RH(D) A NEG    Antibody Screen NEG    Sample Expiration      09/21/2020,2359 Performed at [facility], [place] [address], [place], [place] [address]   ABO/Rh  Result Value Ref Range   ABO/RH(D)      A NEG Performed at [facility] [address], [place], [place] [address]   Surgical pathology  Result Value Ref Range   SURGICAL PATHOLOGY      SURGICAL PATHOLOGY CASE: [id number] PATIENT: Lujean Rave Surgical Pathology Report     Clinical History: Right ovarian cyst (crm)     FINAL MICROSCOPIC DIAGNOSIS:  A. OVARY AND FALLOPIAN TUBE, RIGHT, SALPINGO-OOPHORECTOMY: - Ovary: Serous cystadenoma. - Fallopian tube: No significant histopathologic findings.   GROSS DESCRIPTION:  Received in formalin is a 2.6 x 1.7 x 1.2 cm ovary and attached segment of fallopian tube.  There is a separate 4.3 x 2.2 x 1 cm collapsed tan-pink thin-walled cyst containing clear fluid.  The lining of the cyst is smooth.  The cortical surface of the ovary is focally disrupted along one edge.  The cut surfaces are unremarkable.  The segment of fallopian tube measures 6.2 cm in length x 0.6 cm in diameter.  Sections are submitted in 4 cassettes. 1-2 = sections of cyst. 3-4 = sections of ovary and fallopian tube.  [facility] 09/19/2020)   Final Diagnosis performed by Gillie Manners, MD.   Electronically signed  09/22/2020 Tec hnical component performed at [facility], [place] [address] [place], [place] [address].  Professional component performed at [employer]. [facility], [place] [address] [place], [place] [address].  Immunohistochemistry Technical component (if applicable) was performed at [facility]. [address], [place], [place], [place] [address].   IMMUNOHISTOCHEMISTRY DISCLAIMER (if applicable): Some of these immunohistochemical stains may have been developed and the performance characteristics determine by [facility]. Some may not have been cleared or approved by the U.S. Food and Drug Administration. The FDA has determined that such clearance or approval is not necessary. This test is used for clinical purposes. It should not be regarded as investigational or for research. This laboratory is certified under the [place] (CLIA-88) as qualified to perform high complexity clinical labora tory testing.  The controls stained appropriately.     This visit occurred during the SARS-CoV-2 public health emergency.  Safety protocols were in place, including screening questions prior to the visit, additional usage of staff PPE, and extensive cleaning of exam room while observing appropriate contact time as indicated for disinfecting solutions.   COVID 19 screen:  No recent travel or known exposure to COVID19 The patient denies respiratory symptoms of COVID 19 at this time. The importance of social distancing was discussed today.   Assessment and Plan Problem List Items Addressed This Visit    Chills (without fever) - Primary   Relevant Orders   POC Influenza A&B (Binax test) (Completed)   Novel Coronavirus, NAA ([facility]) (  Completed)   Comprehensive metabolic panel (Completed)   CBC with Differential/Platelet (Completed)   POCT Urinalysis Dipstick (Automated) (Completed)   Intrinsic asthma    Myalgia   Relevant Orders   POC Influenza A&B (Binax test) (Completed)   Novel Coronavirus, NAA ([facility]) (Completed)   Comprehensive metabolic panel (Completed)   CBC with Differential/Platelet (Completed)   Neck stiffness   Relevant Orders   POC Influenza A&B (Binax test) (Completed)   Novel Coronavirus, NAA ([facility]) (Completed)   Comprehensive metabolic panel (Completed)   CBC with Differential/Platelet (Completed)      Unclear cause of symptoms.Marland Kitchen eval with labs and testing as above.  Symptomatic care recommended until labs return.  ER precautions given.    Eliezer Lofts, MD

## 2020-10-25 LAB — SARS-COV-2, NAA 2 DAY TAT

## 2020-10-25 LAB — NOVEL CORONAVIRUS, NAA: SARS-CoV-2, NAA: NOT DETECTED

## 2020-10-29 ENCOUNTER — Encounter: Payer: Self-pay | Admitting: Orthopaedic Surgery

## 2020-10-29 ENCOUNTER — Ambulatory Visit: Payer: BC Managed Care – PPO | Admitting: Orthopaedic Surgery

## 2020-10-29 ENCOUNTER — Other Ambulatory Visit: Payer: Self-pay

## 2020-10-29 DIAGNOSIS — M79642 Pain in left hand: Secondary | ICD-10-CM

## 2020-10-29 MED ORDER — MELOXICAM 15 MG PO TABS: 15.0000 mg | ORAL_TABLET | Freq: Every day | ORAL | 1 refills | Status: DC

## 2020-10-29 MED ORDER — METHYLPREDNISOLONE 4 MG PO TABS: ORAL_TABLET | ORAL | 0 refills | Status: DC

## 2020-10-29 NOTE — Progress Notes (Signed)
Office Visit Note   Patient: Caroline [name]           Date of Birth: [birth year]           MRN: [id number] Visit Date: 10/29/2020              Requested by: Pleas Koch, NP [place],  [place] [address] PCP: Pleas Koch, NP   Assessment & Plan: Visit Diagnoses:  1. Pain in left hand     Plan: I would like to put her on a steroid taper as well as meloxicam.  Also would like to obtain new nerve conduction studies to get a good idea of what we are dealing with with her left upper extremity.  We will see her back in follow-up after as she has those studies completed.  At that visit I would also like to get 3 views of her left hand.  All question concerns were answered and addressed.  She agrees with this treatment plan.  Follow-Up Instructions: No follow-ups on file.   Orders:  No orders of the defined types were placed in this encounter.  Meds ordered this encounter  Medications  . methylPREDNISolone (MEDROL) 4 MG tablet    Sig: Medrol dose pack. Take as instructed    Dispense:  21 tablet    Refill:  0  . meloxicam (MOBIC) 15 MG tablet    Sig: Take 1 tablet (15 mg total) by mouth daily.    Dispense:  30 tablet    Refill:  1      Procedures: No procedures performed   Clinical Data: No additional findings.   Subjective: Chief Complaint  Patient presents with  . Left Hand - Pain  The patient comes in today with worsening left hand pain as well as numbness and tingling.  Apparently years ago we did obtain nerve conduction studies and has recommended carpal tunnel surgery but we have no notes anymore describing what we wanted to do.  She is not a diabetic and she does have a lot of pain when driving and with gripping activities.  She cannot open bottles well with her left hand due to the pain she has.  A lot of the pain is at the base of her thumb and in the forearm as well.  There is also in the wrist and hand.  She denies any injuries.  She has had  no other acute change in her medical status.  This is been slowly getting worse with time.  She was going to see me this summer but then ended up having to have a more urgent surgery there was gynecologic related.  HPI  Review of Systems She currently denies any headache, chest pain, shortness of breath, fever, chills, nausea, vomiting  Objective: Vital Signs: LMP 07/28/2010   Physical Exam She is alert and oriented x3 and in no acute distress Ortho Exam Examination of her left hand shows no muscle atrophy.  She has pretty good grip and pinch strength.  She has a lot of pain over the ulnar styloid but also the base of the thumb.  She has subjective numbness in the median nerve distribution. Specialty Comments:  No specialty comments available.  Imaging: No results found.   PMFS History: Patient Active Problem List   Diagnosis Date Noted  . Preoperative clearance 09/10/2020  . Adnexal mass 02/21/2020  . Multiple renal cysts 02/21/2020  . Anxiety 04/26/2019  . Hypothyroidism 03/15/2019  . Environmental and  seasonal allergies 06/09/2018  . Preventative health care 06/09/2018  . Prediabetes 06/09/2018  . Recurrent cough 09/26/2017  . Hypertension   . Lateral epicondylitis of left elbow 10/07/2016  . Carpal tunnel syndrome, right upper limb 10/07/2016  . Intrinsic asthma 08/06/2014  . Dyslipidemia 02/13/2014  . Closed fracture of right humerus with routine healing 02/06/2013  . IBS (irritable bowel syndrome) 09/15/2011  . Nonspecific elevation of levels of transaminase or lactic acid dehydrogenase (LDH) 09/15/2011  . Coronary vasospasm ([place])   . Tachycardia 07/28/2011  . History of esophagitis 07/28/2011  . CAD (coronary artery disease) 05/28/2011  . Epigastric pain 12/04/2010   Past Medical History:  Diagnosis Date  . CAD (coronary artery disease)    has documented coronary vasospasm but with underlying 40% prox LAD, 30% prox LCX and 70 to 80% proximal 1st DX and 30%  RCA; recurrent vasospasm with subsequent cath in Jan 2013.   Marland Kitchen Coronary vasospasm ([place])   . Environmental and seasonal allergies   . Esophagitis   . GERD (gastroesophageal reflux disease)   . Hyperlipidemia   . Hypertension   . Hypothyroidism   . IBS (irritable bowel syndrome)   . Palpitations    tachycardia on holter; treated with beta blockers  . Right ovarian cyst     Family History  Problem Relation Age of Onset  . Breast cancer Mother 77  . Heart disease Mother        died CHF 67  . Diabetes Mother   . Prostate cancer Father   . Hyperlipidemia Father   . Heart disease Father        s/p cabg - alive  . Colon cancer Other        First cousin on dads side   . Thyroid disease Maternal Grandmother     Past Surgical History:  Procedure Laterality Date  . APPENDECTOMY  1976  . BREAST EXCISIONAL BIOPSY Right    benign  . BREAST SURGERY Right 2002   biopsy  . CARDIAC CATHETERIZATION  2011  . CARDIAC CATHETERIZATION  Sept 2012   With documented vasospasm but with residual disease; managed medically  . CHOLECYSTECTOMY  1982   open  . LAPAROSCOPIC SALPINGO OOPHERECTOMY Right 09/18/2020   Procedure: LAPAROSCOPIC SALPINGO OOPHORECTOMY;  Surgeon: Aloha Gell, MD;  Location: [facility];  Service: Gynecology;  Laterality: Right;  . LEFT HEART CATHETERIZATION WITH CORONARY ANGIOGRAM N/A 09/14/2011   Procedure: LEFT HEART CATHETERIZATION WITH CORONARY ANGIOGRAM;  Surgeon: Burnell Blanks, MD;  Location: [facility] CATH LAB;  Service: Cardiovascular;  Laterality: N/A;  . UPPER GASTROINTESTINAL ENDOSCOPY  01/11/2011   esophagitis   Social History   Occupational History  . Occupation: Unemployed  Tobacco Use  . Smoking status: Former Smoker    Packs/day: 0.25    Years: 30.00    Pack years: 7.50    Types: Cigarettes    Quit date: 05/30/2014    Years since quitting: 6.4  . Smokeless tobacco: Never Used  Vaping Use  . Vaping Use: Never used  Substance and  Sexual Activity  . Alcohol use: Yes    Alcohol/week: 2.0 standard drinks    Types: 2 Standard drinks or equivalent per week    Comment: occ  . Drug use: No  . Sexual activity: Yes

## 2020-11-25 ENCOUNTER — Other Ambulatory Visit: Payer: Self-pay | Admitting: Family Medicine

## 2020-11-25 ENCOUNTER — Other Ambulatory Visit: Payer: Self-pay

## 2020-11-25 DIAGNOSIS — J45909 Unspecified asthma, uncomplicated: Secondary | ICD-10-CM

## 2020-11-25 DIAGNOSIS — J3089 Other allergic rhinitis: Secondary | ICD-10-CM

## 2020-11-25 MED ORDER — LEVOCETIRIZINE DIHYDROCHLORIDE 5 MG PO TABS: 5.0000 mg | ORAL_TABLET | Freq: Every evening | ORAL | 0 refills | Status: DC | PRN

## 2020-11-25 NOTE — Telephone Encounter (Signed)
Called patient she states that she would like to have one refill on hold at pharmacy for when she needs she does not need refill at this time. She is only uses 2-3 times a week.

## 2020-11-25 NOTE — Telephone Encounter (Signed)
Left message to return call to our office.  

## 2020-11-26 NOTE — Telephone Encounter (Signed)
Refills sent to pharmacy. 

## 2020-12-24 DIAGNOSIS — M436 Torticollis: Secondary | ICD-10-CM | POA: Insufficient documentation

## 2020-12-24 DIAGNOSIS — R6883 Chills (without fever): Secondary | ICD-10-CM | POA: Insufficient documentation

## 2020-12-24 DIAGNOSIS — M791 Myalgia, unspecified site: Secondary | ICD-10-CM | POA: Insufficient documentation

## 2021-01-19 DIAGNOSIS — D485 Neoplasm of uncertain behavior of skin: Secondary | ICD-10-CM | POA: Diagnosis not present

## 2021-01-19 DIAGNOSIS — D2272 Melanocytic nevi of left lower limb, including hip: Secondary | ICD-10-CM | POA: Diagnosis not present

## 2021-01-19 DIAGNOSIS — D225 Melanocytic nevi of trunk: Secondary | ICD-10-CM | POA: Diagnosis not present

## 2021-01-19 DIAGNOSIS — D2261 Melanocytic nevi of right upper limb, including shoulder: Secondary | ICD-10-CM | POA: Diagnosis not present

## 2021-01-19 DIAGNOSIS — C44529 Squamous cell carcinoma of skin of other part of trunk: Secondary | ICD-10-CM | POA: Diagnosis not present

## 2021-01-19 DIAGNOSIS — D2262 Melanocytic nevi of left upper limb, including shoulder: Secondary | ICD-10-CM | POA: Diagnosis not present

## 2021-02-03 DIAGNOSIS — R208 Other disturbances of skin sensation: Secondary | ICD-10-CM | POA: Diagnosis not present

## 2021-02-03 DIAGNOSIS — L298 Other pruritus: Secondary | ICD-10-CM | POA: Diagnosis not present

## 2021-02-03 DIAGNOSIS — C44529 Squamous cell carcinoma of skin of other part of trunk: Secondary | ICD-10-CM | POA: Diagnosis not present

## 2021-02-03 DIAGNOSIS — D485 Neoplasm of uncertain behavior of skin: Secondary | ICD-10-CM | POA: Diagnosis not present

## 2021-02-03 DIAGNOSIS — L538 Other specified erythematous conditions: Secondary | ICD-10-CM | POA: Diagnosis not present

## 2021-02-10 ENCOUNTER — Encounter: Payer: Self-pay | Admitting: *Deleted

## 2021-03-05 ENCOUNTER — Telehealth: Payer: Self-pay | Admitting: Physical Medicine and Rehabilitation

## 2021-03-05 NOTE — Telephone Encounter (Signed)
Patient called needing to schedule an appointment with Dr. Ernestina Patches for her wrist. Patient said she will be going out of town Thursday and would like to have an appointment on the books before she leave. The number to contact patient is [phone]

## 2021-03-06 ENCOUNTER — Other Ambulatory Visit: Payer: Self-pay

## 2021-03-06 ENCOUNTER — Other Ambulatory Visit: Payer: Self-pay | Admitting: Cardiovascular Disease

## 2021-03-06 DIAGNOSIS — M79642 Pain in left hand: Secondary | ICD-10-CM

## 2021-03-06 NOTE — Telephone Encounter (Signed)
Sent order for EMG to [facility]

## 2021-03-06 NOTE — Telephone Encounter (Signed)
LMOM for patient to call back and maybe make an appt with Ninfa Linden since she's seen him before?

## 2021-03-06 NOTE — Telephone Encounter (Signed)
Patient called regarding vm she received I advised patient of the previous message patient stated she has already seen Dr.Blackman and he told her to see Dr.Newton which is why she wants to see Dr.Newton patient is requesting a call [phone]

## 2021-03-09 ENCOUNTER — Other Ambulatory Visit: Payer: Self-pay

## 2021-03-09 MED ORDER — DILTIAZEM HCL ER BEADS 300 MG PO CP24: ORAL_CAPSULE | ORAL | 3 refills | Status: DC

## 2021-03-23 ENCOUNTER — Telehealth: Payer: BC Managed Care – PPO | Admitting: Physician Assistant

## 2021-03-23 ENCOUNTER — Encounter: Payer: Self-pay | Admitting: Physician Assistant

## 2021-03-23 DIAGNOSIS — U071 COVID-19: Secondary | ICD-10-CM | POA: Diagnosis not present

## 2021-03-23 MED ORDER — MOLNUPIRAVIR EUA 200MG CAPSULE: 4.0000 | ORAL_CAPSULE | Freq: Two times a day (BID) | ORAL | 0 refills | Status: AC

## 2021-03-23 MED ORDER — BENZONATATE 100 MG PO CAPS: 100.0000 mg | ORAL_CAPSULE | Freq: Three times a day (TID) | ORAL | 0 refills | Status: DC | PRN

## 2021-03-23 NOTE — Patient Instructions (Signed)
Hello Caroline [name],  You are being placed in the home monitoring program for COVID-19 (commonly known as Coronavirus).  This is because you are suspected to have the virus or are known to have the virus.  If you are unsure which group you fall into call your clinic.    As part of this program, you'll answer a daily questionnaire in the MyChart mobile app. You'll receive a notification through the MyChart app when the questionnaire is available. When you log in to MyChart, you'll see the tasks in your To Do activity.       Clinicians will see any answers that are concerning and take appropriate steps.  If at any point you are having a medical emergency, call 911.  If otherwise concerned call your clinic instead of coming into the clinic or hospital.  To keep from spreading the disease you should: Stay home and limit contact with other people as much as possible.  Wash your hands frequently. Cover your coughs and sneezes with a tissue, and throw used tissues in the trash.   Clean and disinfect frequently touched surfaces and objects.    Take care of yourself by: Staying home Resting Drinking fluids Take fever-reducing medications (Tylenol/Acetaminophen and Ibuprofen)  For more information on the disease go to the Centers for Disease Control and Prevention website     You are being prescribed MOLNUPIRAVIR for COVID-19 infection.   Please call the pharmacy or go through the drive through vs going inside if you are picking up the mediation yourself to prevent further spread. If prescribed to a [facility] affiliated pharmacy, a pharmacist will bring the medication out to your car.   ADMINISTRATION INSTRUCTIONS: Take with or without food. Swallow the tablets whole. Don't chew, crush, or break the medications because it might not work as well  For each dose of the medication, you should be taking FOUR tablets at one time, TWICE a day   Finish your full five-day course of Molnupiravir even if you  feel better before you're done. Stopping this medication too early can make it less effective to prevent severe illness related to [place].    Molnupiravir is prescribed for YOU ONLY. Don't share it with others, even if they have similar symptoms as you. This medication might not be right for everyone.   Make sure to take steps to protect yourself and others while you're taking this medication in order to get well soon and to prevent others from getting sick with COVID-19.   **If you are of childbearing potential (any gender) - it is advised to not get pregnant while taking this medication and recommended that condoms are used for female partners the next 3 months after taking the medication out of extreme caution    COMMON SIDE EFFECTS: Diarrhea Nausea  Dizziness    If your COVID-19 symptoms get worse, get medical help right away. Call 911 if you experience symptoms such as worsening cough, trouble breathing, chest pain that doesn't go away, confusion, a hard time staying awake, and pale or blue-colored skin. This medication won't prevent all COVID-19 cases from getting worse.  Can take to lessen severity: Vit C '500mg'$  twice daily Quercertin 250-'500mg'$  twice daily Zinc 75-'100mg'$  daily Melatonin 3-6 mg at bedtime Vit D3 1000-2000 IU daily Aspirin 81 mg daily with food Optional: Famotidine '20mg'$  daily Also can add tylenol/ibuprofen as needed for fevers and body aches May add Mucinex or Mucinex DM as needed for cough/congestion  10 Things You Can Do to  Manage Your COVID-19 Symptoms at Home If you have possible or confirmed COVID-19 Stay home except to get medical care. Monitor your symptoms carefully. If your symptoms get worse, call your healthcare provider immediately. Get rest and stay hydrated. If you have a medical appointment, call the healthcare provider ahead of time and tell them that you have or may have COVID-19. For medical emergencies, call 911 and notify the dispatch  personnel that you have or may have COVID-19. Cover your cough and sneezes with a tissue or use the inside of your elbow. Wash your hands often with soap and water for at least 20 seconds or clean your hands with an alcohol-based hand sanitizer that contains at least 60% alcohol. As much as possible, stay in a specific room and away from other people in your home. Also, you should use a separate bathroom, if available. If you need to be around other people in or outside of the home, wear a mask. Avoid sharing personal items with other people in your household, like dishes, towels, and bedding. Clean all surfaces that are touched often, like counters, tabletops, and doorknobs. Use household cleaning sprays or wipes according to the label instructions. [email or web address] 03/14/2020 This information is not intended to replace advice given to you by your health care provider. Make sure you discuss any questions you have with your healthcare provider. Document Revised: 10/03/2020 Document Reviewed: 10/03/2020 Elsevier Patient Education  2022 [place]: What to Do if You Are Sick CDC has updated isolation and quarantine recommendations for the public, and is revising the CDC website to reflect these changes. These recommendations do not apply to healthcare personnel and do not supersede state, local, tribal, or territorial laws, rules, andregulations. If you have a fever, cough or other symptoms, you might have COVID-19. Most people have mild illness and are able to recover at home. If you are sick: Keep track of your symptoms. If you have an emergency warning sign (including trouble breathing), call 911. Steps to help prevent the spread of COVID-19 if you are sick If you are sick with COVID-19 or think you might have COVID-19, follow the steps below to care for yourself and to help protect other peoplein your home and community. Stay home except to get medical care Stay home. Most  people with COVID-19 have mild illness and can recover at home without medical care. Do not leave your home, except to get medical care. Do not visit public areas. Take care of yourself. Get rest and stay hydrated. Take over-the-counter medicines, such as acetaminophen, to help you feel better. Stay in touch with your doctor. Call before you get medical care. Be sure to get care if you have trouble breathing, or have any other emergency warning signs, or if you think it is an emergency. Avoid public transportation, ride-sharing, or taxis. Separate yourself from other people As much as possible, stay in a specific room and away from other people and pets in your home. If possible, you should use a separate bathroom. If you need to be around other people or animals in oroutside of the home, wear a mask. Tell your close contactsthat they may have been exposed to COVID-19. An infected person can spread COVID-19 starting 48 hours (or 2 days) before the person has any symptoms or tests positive. By letting your close contacts know they may have been exposed to COVID-19, you are helping to protect everyone. Additional guidance is available for those living in close quarters and  shared housing. See COVID-19 and Animals if you have questions about pets. If you are diagnosed with COVID-19, someone from the health department may call you. Answer the call to slow the spread. Monitor your symptoms Symptoms of COVID-19 include fever, cough, or other symptoms. Follow care instructions from your healthcare provider and local health department. Your local health authorities may give instructions on checking your symptoms and reporting information. When to seek emergency medical attention Look for emergency warning signs* for COVID-19. If someone is showing any of these signs, seek emergency medical care immediately: Trouble breathing Persistent pain or pressure in the chest New confusion Inability to wake or stay  awake Pale, gray, or blue-colored skin, lips, or nail beds, depending on skin tone *This list is not all possible symptoms. Please call your medical provider forany other symptoms that are severe or concerning to you. Call 911 or call ahead to your local emergency facility: Notify the operator that you are seeking care for someone who has or may haveCOVID-19. Call ahead before visiting your doctor Call ahead. Many medical visits for routine care are being postponed or done by phone or telemedicine. If you have a medical appointment that cannot be postponed, call your doctor's office, and tell them you have or may have COVID-19. This will help the office protect themselves and other patients. Get tested If you have symptoms of COVID-19, get tested. While waiting for test results, you stay away from others, including staying apart from those living in your household. Self-tests are one of several options for testing for the virus that causes COVID-19 and may be more convenient than laboratory-based tests and point-of-care tests. Ask your healthcare provider or your local health department if you need help interpreting your test results. You can visit your state, tribal, local, and territorial health department's website to look for the latest local information on testing sites. If you are sick, wear a mask over your nose and mouth You should wear a mask over your nose and mouth if you must be around other people or animals, including pets (even at home). You don't need to wear the mask if you are alone. If you can't put on a mask (because of trouble breathing, for example), cover your coughs and sneezes in some other way. Try to stay at least 6 feet away from other people. This will help protect the people around you. Masks should not be placed on young children under age 71 years, anyone who has trouble breathing, or anyone who is not able to remove the mask without help. Note: During the COVID-19  pandemic, medical grade facemasks are reserved forhealthcare workers and some first responders. Cover your coughs and sneezes Cover your mouth and nose with a tissue when you cough or sneeze. Throw away used tissues in a lined trash can. Immediately wash your hands with soap and water for at least 20 seconds. If soap and water are not available, clean your hands with an alcohol-based hand sanitizer that contains at least 60% alcohol. Clean your hands often Wash your hands often with soap and water for at least 20 seconds. This is especially important after blowing your nose, coughing, or sneezing; going to the bathroom; and before eating or preparing food. Use hand sanitizer if soap and water are not available. Use an alcohol-based hand sanitizer with at least 60% alcohol, covering all surfaces of your hands and rubbing them together until they feel dry. Soap and water are the best option, especially if hands  are visibly dirty. Avoid touching your eyes, nose, and mouth with unwashed hands. Handwashing Tips Avoid sharing personal household items Do not share dishes, drinking glasses, cups, eating utensils, towels, or bedding with other people in your home. Wash these items thoroughly after using them with soap and water or put in the dishwasher. Clean all "high-touch" surfaces every day Clean and disinfect high-touch surfaces in your "sick room" and bathroom; wear disposable gloves. Let someone else clean and disinfect surfaces in common areas, but you should clean your bedroom and bathroom, if possible. If a caregiver or other person needs to clean and disinfect a sick person's bedroom or bathroom, they should do so on an as-needed basis. The caregiver/other person should wear a mask and disposable gloves prior to cleaning. They should wait as long as possible after the person who is sick has used the bathroom before coming in to clean and use the bathroom. High-touch surfaces include phones, remote  controls, counters, tabletops, doorknobs, bathroom fixtures, toilets, keyboards, tablets, and bedside tables. Clean and disinfect areas that may have blood, stool, or body fluids on them. Use household cleaners and disinfectants. Clean the area or item with soap and water or another detergent if it is dirty. Then, use a household disinfectant. Be sure to follow the instructions on the label to ensure safe and effective use of the product. Many products recommend keeping the surface wet for several minutes to ensure germs are killed. Many also recommend precautions such as wearing gloves and making sure you have good ventilation during use of the product. Use a product from [employer]: Disinfectants for Coronavirus ([id number]). Complete Disinfection Guidance When you can be around others after being sick with COVID-19 Deciding when you can be around others is different for different situations. Find out when you can safely end home isolation. For any additional questions about your care,contact your healthcare provider or state or local health department. 08/06/2020 Content source: [facility] ([facility]), Division of Viral Diseases This information is not intended to replace advice given to you by your health care provider. Make sure you discuss any questions you have with your healthcare provider. Document Revised: 10/03/2020 Document Reviewed: 10/03/2020 Elsevier Patient Education  [place].

## 2021-03-23 NOTE — Progress Notes (Signed)
Virtual Visit Consent   Jeryl Sciullo, you are scheduled for a virtual visit with a [facility] provider today.     Just as with appointments in the office, your consent must be obtained to participate.  Your consent will be active for this visit and any virtual visit you may have with one of our providers in the next 365 days.     If you have a MyChart account, a copy of this consent can be sent to you electronically.  All virtual visits are billed to your insurance company just like a traditional visit in the office.    As this is a virtual visit, video technology does not allow for your provider to perform a traditional examination.  This may limit your provider's ability to fully assess your condition.  If your provider identifies any concerns that need to be evaluated in person or the need to arrange testing (such as labs, EKG, etc.), we will make arrangements to do so.     Although advances in technology are sophisticated, we cannot ensure that it will always work on either your end or our end.  If the connection with a video visit is poor, the visit may have to be switched to a telephone visit.  With either a video or telephone visit, we are not always able to ensure that we have a secure connection.     I need to obtain your verbal consent now.   Are you willing to proceed with your visit today?    Nykira Flanery has provided verbal consent on 03/23/2021 for a virtual visit (video or telephone).   Mar Daring, PA-C   Date: 03/23/2021 2:21 PM   Virtual Visit via Video Note   I, Mar Daring, connected with  Amadi Igo  ([id number], 1961/07/10) on 03/23/21 at  2:15 PM EDT by a video-enabled telemedicine application and verified that I am speaking with the correct person using two identifiers.  Location: Patient: Virtual Visit Location Patient: Home Provider: Virtual Visit Location Provider: Home Office   I discussed the limitations of evaluation and management by  telemedicine and the availability of in person appointments. The patient expressed understanding and agreed to proceed.    History of Present Illness: Caroline [name] is a 60 y.o. who identifies as a female who was assigned female at birth, and is being seen today for Covid 33.  HPI: URI  This is a new problem. The current episode started in the past 7 days (Symptoms started Saturday, worsened Sunday. Tested positive for Covid 19 today). The problem has been gradually worsening. Maximum temperature: felt feverish last night but no temperature taken. Associated symptoms include congestion, coughing (dry), diarrhea, headaches, neck pain, rhinorrhea, sinus pain and a sore throat. Pertinent negatives include no nausea or vomiting. Associated symptoms comments: Fatigue, chills, myalgias. She has tried acetaminophen and inhaler use for the symptoms. The treatment provided no relief.     Problems:  Patient Active Problem List   Diagnosis Date Noted   Chills (without fever) 12/24/2020   Neck stiffness 12/24/2020   Myalgia 12/24/2020   Preoperative clearance 09/10/2020   Adnexal mass 02/21/2020   Multiple renal cysts 02/21/2020   Anxiety 04/26/2019   Hypothyroidism 03/15/2019   Environmental and seasonal allergies 06/09/2018   Preventative health care 06/09/2018   Prediabetes 06/09/2018   Recurrent cough 09/26/2017   Hypertension    Lateral epicondylitis of left elbow 10/07/2016   Carpal tunnel syndrome, right upper limb 10/07/2016   Intrinsic  asthma 08/06/2014   Dyslipidemia 02/13/2014   Closed fracture of right humerus with routine healing 02/06/2013   IBS (irritable bowel syndrome) 09/15/2011   Nonspecific elevation of levels of transaminase or lactic acid dehydrogenase (LDH) 09/15/2011   Coronary vasospasm (HCC)    Tachycardia 07/28/2011   History of esophagitis 07/28/2011   CAD (coronary artery disease) 05/28/2011   Epigastric pain 12/04/2010    Allergies: No Known  Allergies Medications:  Current Outpatient Medications:    acetaminophen (TYLENOL) 500 MG tablet, Take 500 mg by mouth every 6 (six) hours as needed. For pain, Disp: , Rfl:    aspirin 81 MG tablet, Take 81 mg by mouth daily., Disp: , Rfl:    atorvastatin (LIPITOR) 20 MG tablet, Take 1 tablet (20 mg total) by mouth daily., Disp: 90 tablet, Rfl: 3   Cholecalciferol (VITAMIN D) 2000 units CAPS, Take 2000 units once per day., Disp: 30 capsule, Rfl:    diclofenac Sodium (VOLTAREN) 1 % GEL, Apply 2-4 g topically 4 (four) times daily., Disp: 200 g, Rfl: 2   diltiazem (TIAZAC) 300 MG 24 hr capsule, TAKE 1 CAPSULE BY MOUTH EVERY DAY, Disp: 90 capsule, Rfl: 3   fluticasone (FLONASE) 50 MCG/ACT nasal spray, Place 2 sprays into both nostrils daily., Disp: 16 g, Rfl: 6   ibuprofen (ADVIL) 800 MG tablet, Take 1 tablet (800 mg total) by mouth every 8 (eight) hours as needed., Disp: 60 tablet, Rfl: 1   isosorbide dinitrate (ISORDIL) 30 MG tablet, TAKE 1 TABLET BY MOUTH 3 TIMES DAILY., Disp: 270 tablet, Rfl: 3   levocetirizine (XYZAL) 5 MG tablet, Take 1 tablet (5 mg total) by mouth at bedtime as needed for allergies., Disp: 90 tablet, Rfl: 0   levothyroxine (SYNTHROID) 50 MCG tablet, TAKE 1 TABLET BY MOUTH EVERY DAY IN THE MORNING ON AN EMPTY STOMACH, Disp: 90 tablet, Rfl: 1   meloxicam (MOBIC) 15 MG tablet, Take 1 tablet (15 mg total) by mouth daily., Disp: 30 tablet, Rfl: 1   methylPREDNISolone (MEDROL) 4 MG tablet, Medrol dose pack. Take as instructed, Disp: 21 tablet, Rfl: 0   nitroGLYCERIN (NITROSTAT) 0.4 MG SL tablet, Place 1 tablet (0.4 mg total) under the tongue every 5 (five) minutes as needed for chest pain. X 3 doses, Disp: 25 tablet, Rfl: 3   ondansetron (ZOFRAN ODT) 4 MG disintegrating tablet, Take 1 tablet (4 mg total) by mouth every 8 (eight) hours as needed., Disp: 20 tablet, Rfl: 0   oxyCODONE-acetaminophen (PERCOCET) 5-325 MG tablet, Take 1-2 tablets by mouth every 6 (six) hours as needed for  severe pain., Disp: 30 tablet, Rfl: 0   sertraline (ZOLOFT) 50 MG tablet, TAKE 1 TABLET BY MOUTH EVERYDAY AT BEDTIME for anxiety., Disp: 90 tablet, Rfl: 3   VENTOLIN HFA 108 (90 Base) MCG/ACT inhaler, TAKE 2 PUFFS BY MOUTH EVERY 6 HOURS AS NEEDED, Disp: 8 g, Rfl: 0  Observations/Objective: Patient is well-developed, well-nourished in no acute distress.  Resting comfortably at home.  Head is normocephalic, atraumatic.  No labored breathing.  Dry, harsh, barking cough heard x 2 Speech is clear and coherent with logical content.  Patient is alert and oriented at baseline.   Assessment and Plan: There are no diagnoses linked to this encounter. - Continue OTC symptomatic management of choice - Will send OTC vitamins and supplement information through AVS - Molnupiravir prescribed - Patient enrolled in MyChart symptom monitoring - Push fluids - Rest as needed - Referral to Covid treatment team placed, consult appreciated -  Discussed return precautions and when to seek in-person evaluation, sent via AVS as well  Follow Up Instructions: I discussed the assessment and treatment plan with the patient. The patient was provided an opportunity to ask questions and all were answered. The patient agreed with the plan and demonstrated an understanding of the instructions.  A copy of instructions were sent to the patient via MyChart.  The patient was advised to call back or seek an in-person evaluation if the symptoms worsen or if the condition fails to improve as anticipated.  Time:  I spent 12 minutes with the patient via telehealth technology discussing the above problems/concerns.    Mar Daring, PA-C

## 2021-03-25 ENCOUNTER — Other Ambulatory Visit: Payer: Self-pay | Admitting: Primary Care

## 2021-03-25 DIAGNOSIS — E039 Hypothyroidism, unspecified: Secondary | ICD-10-CM

## 2021-03-27 ENCOUNTER — Telehealth: Payer: Self-pay

## 2021-03-27 NOTE — Telephone Encounter (Signed)
Vm left for patient to callback regarding questionnaire and worsening appetite. My chart message also sent with protocol information.

## 2021-03-29 ENCOUNTER — Encounter (INDEPENDENT_AMBULATORY_CARE_PROVIDER_SITE_OTHER): Payer: Self-pay

## 2021-03-29 ENCOUNTER — Telehealth: Payer: Self-pay

## 2021-03-29 NOTE — Telephone Encounter (Addendum)
Called pt and LM on VM to call back to discuss her SOB and diarrhea. Call back number provided.

## 2021-03-29 NOTE — Telephone Encounter (Signed)
Pt called back. Pt stated that her SOB after coughing and  activity. Pt stated that no SOB at rest. Pt stated cough is productive with clear to white sputum. No fever. Pt talked in full sentences. No chest pain. Pt is using her inhaler periodically.  Pt advised: Shortness of breath is the same:   continue to monitor at home  If symptoms become severe, i.e. shortness of breath at rest, gasping for air, wheezing, call 911 and seek treatment in the ED If cough remains the same or better: continue to treat with over the counter medications.  Hard candy or cough drops and drinking warm fluids. Adults can also use honey 2 tsp (10 ML) at bedtime.  If cough is becoming worse even with the use of over the counter medications and patient is not able to sleep at night, cough becomes productive with sputum that maybe yellow or green in color, contact PCP.    Pt stated that she has diarrhea that started yesterday. Pt was having loose stool 4-5 episodes over what she normally has in a 24 hour period. No abdominal pain. No vomiting. Pt stated she has had no diarrhea today.   Pt advised:  If diarrhea remains the same:  encourage patient to drink oral fluids and bland foods.  Avoid alcohol, spicy foods, caffeine or fatty foods that could make diarrhea worse.  Continue to monitor for signs of dehydration (increased thirst decreased urine output, yellow urine, dry skin, headache or dizziness).  Advise patient to try OTC medication (Imodium, kaopectate, Pepto-Bismol) as per manufacturer's instructions  If worsening diarrhea occurs and becomes severe (6-7 bowel movements a day): notify PCP  If diarrhea last greater than 7 days: notify PCP If signs of dehydration occur (increased thirst, decreased urine output, yellow urine, dry skin, headache or dizziness) advise patient to call 911 and seek treatment in the ED   Discussed qurantine guidelines. Pt verbalized understanding.

## 2021-04-03 ENCOUNTER — Encounter (INDEPENDENT_AMBULATORY_CARE_PROVIDER_SITE_OTHER): Payer: Self-pay

## 2021-04-03 ENCOUNTER — Telehealth: Payer: Self-pay | Admitting: *Deleted

## 2021-04-03 NOTE — Telephone Encounter (Signed)
TC to patient to discuss worsening weakness symptom due to Covid.No answer-left VM to return call if needed. Also replied to [place] with information.

## 2021-04-06 ENCOUNTER — Other Ambulatory Visit: Payer: Self-pay | Admitting: Primary Care

## 2021-04-06 DIAGNOSIS — J45909 Unspecified asthma, uncomplicated: Secondary | ICD-10-CM

## 2021-04-30 ENCOUNTER — Other Ambulatory Visit: Payer: Self-pay | Admitting: Cardiovascular Disease

## 2021-05-08 ENCOUNTER — Encounter: Payer: Self-pay | Admitting: Physical Medicine and Rehabilitation

## 2021-05-08 ENCOUNTER — Ambulatory Visit (INDEPENDENT_AMBULATORY_CARE_PROVIDER_SITE_OTHER): Payer: BC Managed Care – PPO | Admitting: Physical Medicine and Rehabilitation

## 2021-05-08 ENCOUNTER — Other Ambulatory Visit: Payer: Self-pay

## 2021-05-08 DIAGNOSIS — R202 Paresthesia of skin: Secondary | ICD-10-CM | POA: Diagnosis not present

## 2021-05-08 NOTE — Progress Notes (Signed)
Caroline [name] - 60 y.o. female MRN [id number]  Date of birth: [birth year]  Office Visit Note: Visit Date: 05/08/2021 PCP: Pleas Koch, NP Referred by: Pleas Koch, NP  Subjective: Chief Complaint  Patient presents with   Right Hand - Numbness, Pain   Left Hand - Numbness, Pain   HPI:  Caroline [name] is a 60 y.o. female who comes in today at the request of Dr. Jean Rosenthal for electrodiagnostic study of the Left upper extremities.  Patient is Right hand dominant.  She had prior electrodiagnostic study in 2016 of the right upper limb.  This was done in our office and the results are below.  She did have moderate median neuropathy at the wrist on the right.  She now complains of pain numbness and tingling in both hands particularly in the radial digits.  It is worse on the right.  ROS Otherwise per HPI.  Assessment & Plan: Visit Diagnoses:    ICD-10-CM   1. Paresthesia of skin  R20.2 NCV with EMG (electromyography)      Plan: Impression: The above electrodiagnostic study is ABNORMAL and reveals evidence of a moderate right median nerve entrapment at the wrist (carpal tunnel syndrome) affecting sensory and motor components.   There is no significant electrodiagnostic evidence of any other radiculopathy focal nerve entrapment (in particular and no left-sided median nerve neuropathy at the wrist), brachial plexopathy or cervical radiculopathy in either upper limb.  Recommendations: 1.  Follow-up with referring physician. 2.  Continue current management of symptoms. 3.  Continue use of resting splint at night-time and as needed during the day. 4.  Suggest surgical evaluation.  Meds & Orders: No orders of the defined types were placed in this encounter.   Orders Placed This Encounter  Procedures   NCV with EMG (electromyography)     Follow-up: Return in about 2 weeks (around 05/22/2021) for Jean Rosenthal, MD.   Procedures: No procedures performed  EMG  & NCV Findings: Evaluation of the right median motor nerve showed prolonged distal onset latency (4.3 ms) and decreased conduction velocity (Elbow-Wrist, 46 m/s).  The right median (across palm) sensory nerve showed prolonged distal peak latency (Wrist, 4.2 ms).  All remaining nerves (as indicated in the following tables) were within normal limits.  Left vs. Right side comparison data for the median motor nerve indicates abnormal L-R latency difference (1.2 ms) and abnormal L-R velocity difference (Elbow-Wrist, 10 m/s).    All examined muscles (as indicated in the following table) showed no evidence of electrical instability.    Impression: The above electrodiagnostic study is ABNORMAL and reveals evidence of a moderate right median nerve entrapment at the wrist (carpal tunnel syndrome) affecting sensory and motor components.   There is no significant electrodiagnostic evidence of any other radiculopathy focal nerve entrapment (in particular and no left-sided median nerve neuropathy at the wrist), brachial plexopathy or cervical radiculopathy in either upper limb.  Recommendations: 1.  Follow-up with referring physician. 2.  Continue current management of symptoms. 3.  Continue use of resting splint at night-time and as needed during the day. 4.  Suggest surgical evaluation.  ___________________________ Laurence Spates FAAPMR Board Certified, American Board of Physical Medicine and Rehabilitation    Nerve Conduction Studies Anti Sensory Summary Table   Stim Site NR Peak (ms) Norm Peak (ms) P-T Amp (V) Norm P-T Amp Site1 Site2 Delta-P (ms) Dist (cm) Vel (m/s) Norm Vel (m/s)  Left Median Acr Palm Anti Sensory (2nd Digit)  32.[place]  Wrist    3.2 <3.6 34.7 >10 Wrist Palm 1.6 0.0    Palm    1.6 <2.0 32.2         Right Median Acr Palm Anti Sensory (2nd Digit)  31.3C  Wrist    *4.2 <3.6 24.5 >10 Wrist Palm 2.3 0.0    Palm    1.9 <2.0 17.6         Right Radial Anti Sensory (Base 1st Digit)   31.5C  Wrist    2.2 <3.1 31.8  Wrist Base 1st Digit 2.2 0.0    Right Ulnar Anti Sensory (5th Digit)  31.6C  Wrist    3.0 <3.7 17.7 >15.0 Wrist 5th Digit 3.0 14.0 47 >38   Motor Summary Table   Stim Site NR Onset (ms) Norm Onset (ms) O-P Amp (mV) Norm O-P Amp Site1 Site2 Delta-0 (ms) Dist (cm) Vel (m/s) Norm Vel (m/s)  Left Median Motor (Abd Poll Brev)  32.9C  Wrist    3.1 <4.2 5.2 >5 Elbow Wrist 3.5 19.5 56 >50  Elbow    6.6  5.1         Right Median Motor (Abd Poll Brev)  32C  Wrist    *4.3 <4.2 7.0 >5 Elbow Wrist 4.0 18.5 *46 >50  Elbow    8.3  3.5         Right Ulnar Motor (Abd Dig Min)  32.1C  Wrist    2.9 <4.2 9.1 >3 B Elbow Wrist 2.8 19.0 68 >53  B Elbow    5.7  9.4  A Elbow B Elbow 1.1 10.0 91 >53  A Elbow    6.8  9.6          EMG   Side Muscle Nerve Root Ins Act Fibs Psw Amp Dur Poly Recrt Int Fraser Din Comment  Right Abd Poll Brev Median C8-T1 Nml Nml Nml Nml Nml 0 Nml Nml   Right 1stDorInt Ulnar C8-T1 Nml Nml Nml Nml Nml 0 Nml Nml   Right PronatorTeres Median C6-7 Nml Nml Nml Nml Nml 0 Nml Nml   Right Biceps Musculocut C5-6 Nml Nml Nml Nml Nml 0 Nml Nml   Right Deltoid Axillary C5-6 Nml Nml Nml Nml Nml 0 Nml Nml     Nerve Conduction Studies Anti Sensory Left/Right Comparison   Stim Site L Lat (ms) R Lat (ms) L-R Lat (ms) L Amp (V) R Amp (V) L-R Amp (%) Site1 Site2 L Vel (m/s) R Vel (m/s) L-R Vel (m/s)  Median Acr Palm Anti Sensory (2nd Digit)  32.8C  Wrist 3.2 *4.2 1.0 34.7 24.5 29.4 Wrist Palm     Palm 1.6 1.9 0.3 32.2 17.6 45.3       Radial Anti Sensory (Base 1st Digit)  31.5C  Wrist  2.2   31.8  Wrist Base 1st Digit     Ulnar Anti Sensory (5th Digit)  31.6C  Wrist  3.0   17.7  Wrist 5th Digit  47    Motor Left/Right Comparison   Stim Site L Lat (ms) R Lat (ms) L-R Lat (ms) L Amp (mV) R Amp (mV) L-R Amp (%) Site1 Site2 L Vel (m/s) R Vel (m/s) L-R Vel (m/s)  Median Motor (Abd Poll Brev)  32.9C  Wrist 3.1 *4.3 *1.2 5.2 7.0 25.7 Elbow Wrist 56 *46 *10   Elbow 6.6 8.3 1.7 5.1 3.5 31.4       Ulnar Motor (Abd Dig Min)  32.1C  Wrist  2.9   9.1  B Elbow  Wrist  68   B Elbow  5.7   9.4  A Elbow B Elbow  91   A Elbow  6.8   9.6           Waveforms:                Clinical History: 05/16/2015 Impression: The above electrodiagnostic study is ABNORMAL and reveals evidence of a moderate right median nerve entrapment at the wrist (carpal tunnel syndrome) affecting sensory and motor components.   There is no significant electrodiagnostic evidence of any other focal nerve entrapment, brachial plexopathy or cervical radiculopathy.   Recommendations: 1.  Follow-up with referring physician. 2.  Continue current management of symptoms. 3.  Continue use of resting splint at night-time and as needed during the day. 4.  Suggest surgical evaluation.     Objective:  VS:  HT:    WT:   BMI:     BP:   HR: bpm  TEMP: ( )  RESP:  Physical Exam Musculoskeletal:        General: No swelling, tenderness or deformity.     Comments: Inspection reveals no atrophy of the bilateral APB or FDI or hand intrinsics. There is no swelling, color changes, allodynia or dystrophic changes. There is 5 out of 5 strength in the bilateral wrist extension, finger abduction and long finger flexion. There is intact sensation to light touch in all dermatomal and peripheral nerve distributions.  There is a negative Hoffmann's test bilaterally.  Skin:    General: Skin is warm and dry.     Findings: No erythema or rash.  Neurological:     General: No focal deficit present.     Mental Status: She is alert and oriented to person, place, and time.     Motor: No weakness or abnormal muscle tone.     Coordination: Coordination normal.  Psychiatric:        Mood and Affect: Mood normal.        Behavior: Behavior normal.     Imaging: No results found.

## 2021-05-08 NOTE — Progress Notes (Signed)
Pain in palm and thumb both hands- worse on right. Numbness in hands.  Right hand dominant +Lotion Numeric Pain Rating Scale and Functional Assessment Average Pain 9   In the last MONTH (on 0-10 scale) has pain interfered with the following?  1. General activity like being  able to carry out your everyday physical activities such as walking, climbing stairs, carrying groceries, or moving a chair?  Rating(10)

## 2021-05-13 NOTE — Procedures (Signed)
EMG & NCV Findings: Evaluation of the right median motor nerve showed prolonged distal onset latency (4.3 ms) and decreased conduction velocity (Elbow-Wrist, 46 m/s).  The right median (across palm) sensory nerve showed prolonged distal peak latency (Wrist, 4.2 ms).  All remaining nerves (as indicated in the following tables) were within normal limits.  Left vs. Right side comparison data for the median motor nerve indicates abnormal L-R latency difference (1.2 ms) and abnormal L-R velocity difference (Elbow-Wrist, 10 m/s).    All examined muscles (as indicated in the following table) showed no evidence of electrical instability.    Impression: The above electrodiagnostic study is ABNORMAL and reveals evidence of a moderate right median nerve entrapment at the wrist (carpal tunnel syndrome) affecting sensory and motor components.   There is no significant electrodiagnostic evidence of any other radiculopathy focal nerve entrapment (in particular and no left-sided median nerve neuropathy at the wrist), brachial plexopathy or cervical radiculopathy in either upper limb.  Recommendations: 1.  Follow-up with referring physician. 2.  Continue current management of symptoms. 3.  Continue use of resting splint at night-time and as needed during the day. 4.  Suggest surgical evaluation.  ___________________________ Laurence Spates FAAPMR Board Certified, American Board of Physical Medicine and Rehabilitation    Nerve Conduction Studies Anti Sensory Summary Table   Stim Site NR Peak (ms) Norm Peak (ms) P-T Amp (V) Norm P-T Amp Site1 Site2 Delta-P (ms) Dist (cm) Vel (m/s) Norm Vel (m/s)  Left Median Acr Palm Anti Sensory (2nd Digit)  32.8C  Wrist    3.2 <3.6 34.7 >10 Wrist Palm 1.6 0.0    Palm    1.6 <2.0 32.2         Right Median Acr Palm Anti Sensory (2nd Digit)  31.3C  Wrist    *4.2 <3.6 24.5 >10 Wrist Palm 2.3 0.0    Palm    1.9 <2.0 17.6         Right Radial Anti Sensory (Base 1st Digit)   31.5C  Wrist    2.2 <3.1 31.8  Wrist Base 1st Digit 2.2 0.0    Right Ulnar Anti Sensory (5th Digit)  31.6C  Wrist    3.0 <3.7 17.7 >15.0 Wrist 5th Digit 3.0 14.0 47 >38   Motor Summary Table   Stim Site NR Onset (ms) Norm Onset (ms) O-P Amp (mV) Norm O-P Amp Site1 Site2 Delta-0 (ms) Dist (cm) Vel (m/s) Norm Vel (m/s)  Left Median Motor (Abd Poll Brev)  32.9C  Wrist    3.1 <4.2 5.2 >5 Elbow Wrist 3.5 19.5 56 >50  Elbow    6.6  5.1         Right Median Motor (Abd Poll Brev)  32C  Wrist    *4.3 <4.2 7.0 >5 Elbow Wrist 4.0 18.5 *46 >50  Elbow    8.3  3.5         Right Ulnar Motor (Abd Dig Min)  32.1C  Wrist    2.9 <4.2 9.1 >3 B Elbow Wrist 2.8 19.0 68 >53  B Elbow    5.7  9.4  A Elbow B Elbow 1.1 10.0 91 >53  A Elbow    6.8  9.6          EMG   Side Muscle Nerve Root Ins Act Fibs Psw Amp Dur Poly Recrt Int Fraser Din Comment  Right Abd Poll Brev Median C8-T1 Nml Nml Nml Nml Nml 0 Nml Nml   Right 1stDorInt Ulnar C8-T1 Nml Nml Nml  Nml Nml 0 Nml Nml   Right PronatorTeres Median C6-7 Nml Nml Nml Nml Nml 0 Nml Nml   Right Biceps Musculocut C5-6 Nml Nml Nml Nml Nml 0 Nml Nml   Right Deltoid Axillary C5-6 Nml Nml Nml Nml Nml 0 Nml Nml     Nerve Conduction Studies Anti Sensory Left/Right Comparison   Stim Site L Lat (ms) R Lat (ms) L-R Lat (ms) L Amp (V) R Amp (V) L-R Amp (%) Site1 Site2 L Vel (m/s) R Vel (m/s) L-R Vel (m/s)  Median Acr Palm Anti Sensory (2nd Digit)  32.8C  Wrist 3.2 *4.2 1.0 34.7 24.5 29.4 Wrist Palm     Palm 1.6 1.9 0.3 32.2 17.6 45.3       Radial Anti Sensory (Base 1st Digit)  31.5C  Wrist  2.2   31.8  Wrist Base 1st Digit     Ulnar Anti Sensory (5th Digit)  31.6C  Wrist  3.0   17.7  Wrist 5th Digit  47    Motor Left/Right Comparison   Stim Site L Lat (ms) R Lat (ms) L-R Lat (ms) L Amp (mV) R Amp (mV) L-R Amp (%) Site1 Site2 L Vel (m/s) R Vel (m/s) L-R Vel (m/s)  Median Motor (Abd Poll Brev)  32.9C  Wrist 3.1 *4.3 *1.2 5.2 7.0 25.7 Elbow Wrist 56 *46 *10   Elbow 6.6 8.3 1.7 5.1 3.5 31.4       Ulnar Motor (Abd Dig Min)  32.1C  Wrist  2.9   9.1  B Elbow Wrist  68   B Elbow  5.7   9.4  A Elbow B Elbow  91   A Elbow  6.8   9.6           Waveforms:

## 2021-05-17 ENCOUNTER — Other Ambulatory Visit: Payer: Self-pay | Admitting: Cardiovascular Disease

## 2021-05-21 ENCOUNTER — Other Ambulatory Visit: Payer: Self-pay

## 2021-05-21 ENCOUNTER — Encounter: Payer: Self-pay | Admitting: Cardiovascular Disease

## 2021-05-21 ENCOUNTER — Ambulatory Visit: Payer: BC Managed Care – PPO | Admitting: Cardiovascular Disease

## 2021-05-21 VITALS — BP 130/68 | HR 95 | Ht 64.0 in | Wt 176.4 lb

## 2021-05-21 DIAGNOSIS — E7800 Pure hypercholesterolemia, unspecified: Secondary | ICD-10-CM | POA: Diagnosis not present

## 2021-05-21 DIAGNOSIS — I25118 Atherosclerotic heart disease of native coronary artery with other forms of angina pectoris: Secondary | ICD-10-CM

## 2021-05-21 NOTE — Progress Notes (Signed)
Chief Complaint  Patient presents with   Follow-up    CAD    History of Present Illness: 60 yo female with history of CAD, Prinzmetal angina, HLD, IBS, GERD who is here today for cardiac follow up. She had been followed by Dr. Mare Ferrari prior to his retirement. She has Prinzmetal angina controlled with Diltiazem and Isordil. Last cardiac cath in January 2013 at which time she was noted to have spasm in the ostial LAD with mild calcification, ostial Diagonal 50% stenosis, 20% mid Circumflex stenosis, 30% OM stenosis, 30% mid RCA stenosis. LVEF 55% by cath. ABI normal September 2017. Nuclear stress test in February 2018 with no ischemia. She was seen in the [facility] 02/19/20 with epigastric pain. Troponin negative. EKG without ischemic changes.   She is here today for follow up. The patient denies any chest pain, dyspnea, palpitations, lower extremity edema, orthopnea, PND, dizziness, near syncope or syncope.     Primary Care Physician: Pleas Koch, NP  Past Medical History:  Diagnosis Date   CAD (coronary artery disease)    has documented coronary vasospasm but with underlying 40% prox LAD, 30% prox LCX and 70 to 80% proximal 1st DX and 30% RCA; recurrent vasospasm with subsequent cath in Jan 2013.    Coronary vasospasm (HCC)    Environmental and seasonal allergies    Esophagitis    GERD (gastroesophageal reflux disease)    Hyperlipidemia    Hypertension    Hypothyroidism    IBS (irritable bowel syndrome)    Palpitations    tachycardia on holter; treated with beta blockers   Right ovarian cyst     Past Surgical History:  Procedure Laterality Date   APPENDECTOMY  1976   BREAST EXCISIONAL BIOPSY Right    benign   BREAST SURGERY Right 2002   biopsy   CARDIAC CATHETERIZATION  2011   CARDIAC CATHETERIZATION  Sept 2012   With documented vasospasm but with residual disease; managed medically   CHOLECYSTECTOMY  1982   open   LAPAROSCOPIC SALPINGO OOPHERECTOMY Right  09/18/2020   Procedure: LAPAROSCOPIC SALPINGO OOPHORECTOMY;  Surgeon: Aloha Gell, MD;  Location: [facility];  Service: Gynecology;  Laterality: Right;   LEFT HEART CATHETERIZATION WITH CORONARY ANGIOGRAM N/A 09/14/2011   Procedure: LEFT HEART CATHETERIZATION WITH CORONARY ANGIOGRAM;  Surgeon: Burnell Blanks, MD;  Location: [facility] CATH LAB;  Service: Cardiovascular;  Laterality: N/A;   UPPER GASTROINTESTINAL ENDOSCOPY  01/11/2011   esophagitis    Current Outpatient Medications  Medication Sig Dispense Refill   acetaminophen (TYLENOL) 500 MG tablet Take 500 mg by mouth every 6 (six) hours as needed. For pain     albuterol (VENTOLIN HFA) 108 (90 Base) MCG/ACT inhaler INHALE 2 PUFFS BY MOUTH EVERY 6 HOURS AS NEEDED 8 each 0   aspirin 81 MG tablet Take 81 mg by mouth daily.     atorvastatin (LIPITOR) 20 MG tablet TAKE 1 TABLET BY MOUTH EVERY DAY 90 tablet 1   Cholecalciferol (VITAMIN D) 2000 units CAPS Take 2000 units once per day. 30 capsule    diclofenac Sodium (VOLTAREN) 1 % GEL Apply 2-4 g topically 4 (four) times daily. 200 g 2   diltiazem (TIAZAC) 300 MG 24 hr capsule TAKE 1 CAPSULE BY MOUTH EVERY DAY 90 capsule 3   fluticasone (FLONASE) 50 MCG/ACT nasal spray Place 2 sprays into both nostrils daily. 16 g 6   ibuprofen (ADVIL) 800 MG tablet Take 1 tablet (800 mg total) by mouth every  8 (eight) hours as needed. 60 tablet 1   isosorbide dinitrate (ISORDIL) 30 MG tablet TAKE 1 TABLET BY MOUTH THREE TIMES A DAY 270 tablet 0   levothyroxine (SYNTHROID) 50 MCG tablet Take 1 tablet by mouth every morning on an empty stomach with water only.  No food or other medications for 30 minutes. 90 tablet 1   nitroGLYCERIN (NITROSTAT) 0.4 MG SL tablet Place 1 tablet (0.4 mg total) under the tongue every 5 (five) minutes as needed for chest pain. X 3 doses 25 tablet 3   sertraline (ZOLOFT) 50 MG tablet TAKE 1 TABLET BY MOUTH EVERYDAY AT BEDTIME for anxiety. 90 tablet 3   No current  facility-administered medications for this visit.    No Known Allergies  Social History   Socioeconomic History   Marital status: Married    Spouse name: Not on file   Number of children: 2   Years of education: Not on file   Highest education level: Not on file  Occupational History   Occupation: Unemployed  Tobacco Use   Smoking status: Former    Packs/day: 0.25    Years: 30.00    Pack years: 7.50    Types: Cigarettes    Quit date: 05/30/2014    Years since quitting: 6.9   Smokeless tobacco: Never  Vaping Use   Vaping Use: Never used  Substance and Sexual Activity   Alcohol use: Yes    Alcohol/week: 2.0 standard drinks    Types: 2 Standard drinks or equivalent per week    Comment: occ   Drug use: No   Sexual activity: Yes  Other Topics Concern   Not on file  Social History Narrative   Married.   2 children.   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   She enjoys being with her dogs, shopping.    Social Determinants of Health   Financial Resource Strain: Not on file  Food Insecurity: Not on file  Transportation Needs: Not on file  Physical Activity: Not on file  Stress: Not on file  Social Connections: Not on file  Intimate Partner Violence: Not on file    Family History  Problem Relation Age of Onset   Breast cancer Mother 44   Heart disease Mother        died CHF 38   Diabetes Mother    Prostate cancer Father    Hyperlipidemia Father    Heart disease Father        s/p cabg - alive   Colon cancer Other        First cousin on dads side    Thyroid disease Maternal Grandmother     Review of Systems:  As stated in the HPI and otherwise negative.   BP (!) 154/78   Pulse 95   Ht 5\' 4"  (1.626 m)   Wt 176 lb 6.4 oz (80 kg)   LMP 07/28/2010   SpO2 96%   BMI 30.28 kg/m   Physical Examination: General: Well developed, well nourished, NAD  HEENT: OP clear, mucus membranes moist  SKIN: warm, dry. No rashes. Neuro: No focal deficits  Musculoskeletal:  Muscle strength 5/5 all ext  Psychiatric: Mood and affect normal  Neck: No JVD, no carotid bruits, no thyromegaly, no lymphadenopathy.  Lungs:Clear bilaterally, no wheezes, rhonci, crackles Cardiovascular: Regular rate and rhythm. No murmurs, gallops or rubs. Abdomen:Soft. Bowel sounds present. Non-tender.  Extremities: No lower extremity edema. Pulses are 2 + in the bilateral DP/PT.  Cardiac cath September 12, 2011:  Left main: No obstructive disease. As noted above, pt with intense vasospasm at beginning of case.  Left Anterior Descending Artery: Ostial spasm however after NTG given, there is minimal obstructive disease in the ostium with mild calcification. The diagonal has ostial 50% stenosis, unchanged from previous cath.  Circumflex Artery: During initial injections, there is a 99% mid stenosis, however, after NTG given this area responded well with minimal 20% stenosis. The proximal portion of the OM 1 also had 30% stenosis.  Right Coronary Artery: Large, dominant vessel with 30% mid stenosis.  Left Ventricular Angiogram: LVEF=55%.   EKG:  EKG is not ordered today. The ekg ordered today demonstrates   Recent Labs: 09/10/2020: TSH 2.76 10/24/2020: ALT 22; BUN 12; Creatinine, Ser 0.69; Hemoglobin 14.5; Platelets 186.0; Potassium 4.1; Sodium 138   Lipid Panel    Component Value Date/Time   CHOL 168 09/10/2020 1509   CHOL 168 03/13/2019 1411   TRIG 95.0 09/10/2020 1509   HDL 81.50 09/10/2020 1509   HDL 89 03/13/2019 1411   CHOLHDL 2 09/10/2020 1509   VLDL 19.0 09/10/2020 1509   LDLCALC 68 09/10/2020 1509   LDLCALC 62 03/13/2019 1411     Wt Readings from Last 3 Encounters:  05/21/21 176 lb 6.4 oz (80 kg)  10/24/20 180 lb (81.6 kg)  09/18/20 179 lb 3.2 oz (81.3 kg)     Other studies Reviewed: Additional studies/ records that were reviewed today include: . Review of the above records demonstrates:   Assessment and Plan:   1. CAD with stable angina/suspected coronary  vasospasm: She has moderate CAD by cath in 2013 with documented coronary vasospasm. No chest pain concerning for angina. Ranexa stopped due to cost. Will continue ASA, Isordil, Diltiazem and statin.   2. Hyperlipidemia: Lipids followed in primary care. LDL at goal in January 2022. Will continue statin.    Current medicines are reviewed at length with the patient today.  The patient does not have concerns regarding medicines.  The following changes have been made:  no change  Labs/ tests ordered today include:   No orders of the defined types were placed in this encounter.   Disposition:   F/U with me in 12 months  Signed, Lauree Chandler, MD 05/21/2021 4:38 PM    [facility] [address], [place], [place]  [address] Phone: [phone]; Fax: [phone]

## 2021-05-21 NOTE — Patient Instructions (Signed)

## 2021-05-26 ENCOUNTER — Encounter: Payer: Self-pay | Admitting: Orthopaedic Surgery

## 2021-05-26 ENCOUNTER — Ambulatory Visit: Payer: BC Managed Care – PPO | Admitting: Orthopaedic Surgery

## 2021-05-26 DIAGNOSIS — G5601 Carpal tunnel syndrome, right upper limb: Secondary | ICD-10-CM | POA: Diagnosis not present

## 2021-05-26 NOTE — Progress Notes (Signed)
The patient comes in today to go over nerve conduction studies of her bilateral upper extremities.  She is a Curator and right-hand-dominant.  The nerve conduction studies do show moderate carpal tunnel syndrome on the right side.  There is no evidence of nerve entrapment on the left side.  I talked to her in length in detail what the studies mean.  I described the anatomy of the wrist and the transverse carpal ligament.  I did recommend a release of the median nerve with carpal tunnel surgery on the right side.  The surgery was described in detail as well as the risk and benefits of surgery.  She would like to consider steroid injection at the same time in her left transverse carpal ligament area and I agree with that as well.  She is otherwise a healthy individual with no significant medical issues.  On exam she still has increased grip and pinch strength on the right side with numbness and tingling in the median nerve distribution.  There is still some pain on her left side but no weakness.  We will work on getting her surgery scheduled and we will see her back in 2 weeks postoperative for suture removal.  All questions and concerns were answered and addressed.

## 2021-06-17 ENCOUNTER — Other Ambulatory Visit: Payer: Self-pay | Admitting: Primary Care

## 2021-06-17 DIAGNOSIS — J45909 Unspecified asthma, uncomplicated: Secondary | ICD-10-CM

## 2021-06-18 ENCOUNTER — Other Ambulatory Visit: Payer: Self-pay | Admitting: Orthopaedic Surgery

## 2021-06-18 DIAGNOSIS — G5602 Carpal tunnel syndrome, left upper limb: Secondary | ICD-10-CM | POA: Diagnosis not present

## 2021-06-18 DIAGNOSIS — G5601 Carpal tunnel syndrome, right upper limb: Secondary | ICD-10-CM | POA: Diagnosis not present

## 2021-06-18 DIAGNOSIS — G5603 Carpal tunnel syndrome, bilateral upper limbs: Secondary | ICD-10-CM | POA: Diagnosis not present

## 2021-06-18 MED ORDER — HYDROCODONE-ACETAMINOPHEN 5-325 MG PO TABS: 1.0000 | ORAL_TABLET | Freq: Four times a day (QID) | ORAL | 0 refills | Status: DC | PRN

## 2021-06-25 ENCOUNTER — Telehealth: Payer: Self-pay

## 2021-06-25 ENCOUNTER — Encounter: Payer: Self-pay | Admitting: Orthopaedic Surgery

## 2021-06-25 NOTE — Telephone Encounter (Signed)
Pt called and would like to know if she needs to wait an extra 6 days after taking her bandage off to get her incision wet ?  Pt also was stating that there is a strip over the sutures and she wanted to know if she needed to leave that intact.

## 2021-06-25 NOTE — Telephone Encounter (Signed)
Called and advised pt. She stated understanding  

## 2021-07-01 ENCOUNTER — Encounter: Payer: Self-pay | Admitting: Orthopaedic Surgery

## 2021-07-01 ENCOUNTER — Ambulatory Visit (INDEPENDENT_AMBULATORY_CARE_PROVIDER_SITE_OTHER): Payer: BC Managed Care – PPO | Admitting: Orthopaedic Surgery

## 2021-07-01 DIAGNOSIS — Z9889 Other specified postprocedural states: Secondary | ICD-10-CM | POA: Insufficient documentation

## 2021-07-01 NOTE — Progress Notes (Signed)
The patient is 2-week status post a right open carpal tunnel release.  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  She is doing well overall.  She said it is really tender.  Her carpal tunnel syndrome was quite severe so she still has numbness and tingling.  We did place a steroid injection on her left hand and she still has numbness and tingling on that side as well.  She does have known carpal tunnel syndrome on the left side.  I did remove the sutures from her right hand and place Steri-Strips.  I placed a protective dressing around this and gave her extra Steri-Strips.  She will be careful with that hand.  She understands that it can take 6 months to a year to get full recovery given the severity of her carpal tunnel syndrome.  I would like to see her back in 4 weeks to see how she is doing overall.  All questions and concerns were answered and addressed.

## 2021-07-06 DIAGNOSIS — Z85828 Personal history of other malignant neoplasm of skin: Secondary | ICD-10-CM | POA: Diagnosis not present

## 2021-07-06 DIAGNOSIS — D225 Melanocytic nevi of trunk: Secondary | ICD-10-CM | POA: Diagnosis not present

## 2021-07-06 DIAGNOSIS — D2261 Melanocytic nevi of right upper limb, including shoulder: Secondary | ICD-10-CM | POA: Diagnosis not present

## 2021-07-06 DIAGNOSIS — D2262 Melanocytic nevi of left upper limb, including shoulder: Secondary | ICD-10-CM | POA: Diagnosis not present

## 2021-07-10 ENCOUNTER — Other Ambulatory Visit: Payer: Self-pay | Admitting: Primary Care

## 2021-07-10 DIAGNOSIS — J45909 Unspecified asthma, uncomplicated: Secondary | ICD-10-CM

## 2021-07-29 ENCOUNTER — Encounter: Payer: Self-pay | Admitting: Orthopaedic Surgery

## 2021-07-29 ENCOUNTER — Other Ambulatory Visit: Payer: Self-pay

## 2021-07-29 ENCOUNTER — Ambulatory Visit (INDEPENDENT_AMBULATORY_CARE_PROVIDER_SITE_OTHER): Payer: BC Managed Care – PPO | Admitting: Orthopaedic Surgery

## 2021-07-29 DIAGNOSIS — Z9889 Other specified postprocedural states: Secondary | ICD-10-CM

## 2021-07-29 MED ORDER — METHYLPREDNISOLONE 4 MG PO TABS
ORAL_TABLET | ORAL | 0 refills | Status: DC
Start: 2021-07-29 — End: 2021-09-29

## 2021-07-29 NOTE — Progress Notes (Signed)
The patient is now about 6 weeks status post a right open carpal tunnel release.  She does have significant carpal tunnel syndrome of the left side and wants to consider surgery on that left side sometime before April of next year.  She does report that she is doing better with her right hand but there is certainly swelling and inflammation around the incision itself.  On examination incision does look good but there is some inflammation.  There is no evidence of infection.  She does have diclofenac cream at home and so I would like her to place that over her incision twice daily.  I will also put her on a 6-day steroid taper that may help with inflammation as well.  She will continue to increase her activities as comforted.  I will see her back in 6 weeks to see how she is doing overall.

## 2021-07-30 ENCOUNTER — Other Ambulatory Visit: Payer: Self-pay | Admitting: Cardiovascular Disease

## 2021-08-28 ENCOUNTER — Other Ambulatory Visit: Payer: Self-pay | Admitting: Cardiovascular Disease

## 2021-09-14 ENCOUNTER — Ambulatory Visit (INDEPENDENT_AMBULATORY_CARE_PROVIDER_SITE_OTHER): Payer: BC Managed Care – PPO | Admitting: Orthopaedic Surgery

## 2021-09-14 ENCOUNTER — Encounter: Payer: Self-pay | Admitting: Orthopaedic Surgery

## 2021-09-14 DIAGNOSIS — G5601 Carpal tunnel syndrome, right upper limb: Secondary | ICD-10-CM

## 2021-09-14 DIAGNOSIS — G5602 Carpal tunnel syndrome, left upper limb: Secondary | ICD-10-CM

## 2021-09-14 DIAGNOSIS — Z9889 Other specified postprocedural states: Secondary | ICD-10-CM

## 2021-09-14 NOTE — Progress Notes (Signed)
The patient is just over 3 months status post a right open carpal tunnel release.  She does have significant carpal tunnel syndrome on the left side.  She is hoping you have surgery done on the left hand sometime in late March.  She is still getting over her right carpal tunnel release.  On examination her incision looks much better overall.  There is no muscle atrophy of the hand and she can move her thumb and fingers.  There is definitely weakness on grip strength and pinch strength.  She does have a ball at home that she can squeeze.  Offered her hand therapy but she does feel like she can do this on her own.  I did tell her that with time this should improve as well but it does take some time.  We will see her back in about 6 weeks and at that visit can see about scheduling her for a left open carpal tunnel release.  She is continuing diclofenac cream as needed.  All questions and concerns were answered and addressed.

## 2021-09-16 DIAGNOSIS — H52223 Regular astigmatism, bilateral: Secondary | ICD-10-CM | POA: Diagnosis not present

## 2021-09-16 DIAGNOSIS — Z135 Encounter for screening for eye and ear disorders: Secondary | ICD-10-CM | POA: Diagnosis not present

## 2021-09-16 DIAGNOSIS — H524 Presbyopia: Secondary | ICD-10-CM | POA: Diagnosis not present

## 2021-09-16 DIAGNOSIS — H259 Unspecified age-related cataract: Secondary | ICD-10-CM | POA: Diagnosis not present

## 2021-09-18 ENCOUNTER — Telehealth: Payer: Self-pay | Admitting: Primary Care

## 2021-09-18 DIAGNOSIS — E039 Hypothyroidism, unspecified: Secondary | ICD-10-CM

## 2021-09-18 NOTE — Telephone Encounter (Signed)
Patient due for CPE and follow up. Please schedule.  Any slot, any day is fine.

## 2021-09-19 ENCOUNTER — Other Ambulatory Visit: Payer: Self-pay | Admitting: Primary Care

## 2021-09-19 ENCOUNTER — Other Ambulatory Visit: Payer: Self-pay | Admitting: Cardiovascular Disease

## 2021-09-19 DIAGNOSIS — F419 Anxiety disorder, unspecified: Secondary | ICD-10-CM

## 2021-09-21 NOTE — Telephone Encounter (Signed)
Called pt and got her scheduled for 2/17 @ 8:40 am

## 2021-09-28 ENCOUNTER — Other Ambulatory Visit: Payer: Self-pay

## 2021-09-28 ENCOUNTER — Emergency Department (HOSPITAL_COMMUNITY)
Admission: EM | Admit: 2021-09-28 | Discharge: 2021-09-29 | Disposition: A | Discharge status: Home / Self Care | Payer: BC Managed Care – PPO | Attending: Emergency Medicine | Admitting: Emergency Medicine

## 2021-09-28 ENCOUNTER — Emergency Department (HOSPITAL_COMMUNITY): Payer: BC Managed Care – PPO

## 2021-09-28 ENCOUNTER — Telehealth: Payer: Self-pay | Admitting: Cardiovascular Disease

## 2021-09-28 ENCOUNTER — Encounter (HOSPITAL_COMMUNITY): Payer: Self-pay | Admitting: Emergency Medicine

## 2021-09-28 DIAGNOSIS — R0609 Other forms of dyspnea: Secondary | ICD-10-CM | POA: Insufficient documentation

## 2021-09-28 DIAGNOSIS — R079 Chest pain, unspecified: Secondary | ICD-10-CM | POA: Insufficient documentation

## 2021-09-28 DIAGNOSIS — Z5321 Procedure and treatment not carried out due to patient leaving prior to being seen by health care provider: Secondary | ICD-10-CM | POA: Insufficient documentation

## 2021-09-28 DIAGNOSIS — M549 Dorsalgia, unspecified: Secondary | ICD-10-CM | POA: Diagnosis not present

## 2021-09-28 DIAGNOSIS — R0789 Other chest pain: Secondary | ICD-10-CM | POA: Diagnosis not present

## 2021-09-28 DIAGNOSIS — R0602 Shortness of breath: Secondary | ICD-10-CM | POA: Diagnosis not present

## 2021-09-28 DIAGNOSIS — M25512 Pain in left shoulder: Secondary | ICD-10-CM | POA: Insufficient documentation

## 2021-09-28 LAB — CBC
HCT: 45.4 % (ref 36.0–46.0)
Hemoglobin: 14.9 g/dL (ref 12.0–15.0)
MCH: 33 pg (ref 26.0–34.0)
MCHC: 32.8 g/dL (ref 30.0–36.0)
MCV: 100.4 fL — ABNORMAL HIGH (ref 80.0–100.0)
Platelets: 203 10*3/uL (ref 150–400)
RBC: 4.52 MIL/uL (ref 3.87–5.11)
RDW: 13.6 % (ref 11.5–15.5)
WBC: 10.8 10*3/uL — ABNORMAL HIGH (ref 4.0–10.5)
nRBC: 0 % (ref 0.0–0.2)

## 2021-09-28 LAB — TROPONIN I (HIGH SENSITIVITY)
Troponin I (High Sensitivity): 4 ng/L (ref <18)
Troponin I (High Sensitivity): 6 ng/L (ref <18)

## 2021-09-28 LAB — BASIC METABOLIC PANEL
Anion gap: 9 (ref 5–15)
BUN: 12 mg/dL (ref 8–23)
CO2: 25 mmol/L (ref 22–32)
Calcium: 9 mg/dL (ref 8.9–10.3)
Chloride: 103 mmol/L (ref 98–111)
Creatinine, Ser: 0.82 mg/dL (ref 0.44–1.00)
GFR, Estimated: >60 mL/min (ref >60)
Glucose, Bld: 119 mg/dL — ABNORMAL HIGH (ref 70–99)
Potassium: 3.6 mmol/L (ref 3.5–5.1)
Sodium: 137 mmol/L (ref 135–145)

## 2021-09-28 NOTE — Telephone Encounter (Signed)
Pt c/o Shortness Of Breath: STAT if SOB developed within the last 24 hours or pt is noticeably SOB on the phone  1. Are you currently SOB (can you hear that pt is SOB on the phone)? No   2. How long have you been experiencing SOB?  Last couple of weeks it has been occurring more so than normal   3. Are you SOB when sitting or when up moving around? Moving around   4. Are you currently experiencing any other symptoms? Fatigue and little "tweaking/stabbing pains" in upper back where her back meets her shoulder.  Reports she has been taking Nitroglycerin for stabbing pains. Did have pain today, took Nitroglycerin twice today. Back pain has been occurring 3 weeks ago.

## 2021-09-28 NOTE — ED Triage Notes (Signed)
Patient reports shortness of breath, back pain and left shoulder pain for past 2.5 weeks but has worsened. Spoke with cardiology today and was referred here. No pain at this time.

## 2021-09-28 NOTE — ED Provider Triage Note (Signed)
Emergency Medicine Provider Triage Evaluation Note  Caroline [name] , a 61 y.o. female  was evaluated in triage.  Pt complains of intermittent chest pain for several weeks with associated shortness of breath.  Patient states that she has been taking nitro at home, had to take 2 today with moderate relief.  She notes intermittent sharp pain that takes her breath away, as well as worsening shortness of breath at baseline.  This is made worse with exertion.  She contacted her cardiologist today who advised she come to the emergency department for evaluation  Review of Systems  Positive: Chest pain, dyspnea on exertion, shortness of breath Negative: Fever, chills, cough, nausea, vomiting  Physical Exam  BP 105/80 (BP Location: Right Arm)    Pulse 91    Temp 97.8 F (36.6 C) (Oral)    Resp 19    Ht 5' 3.5" (1.613 m)    Wt 75.8 kg    LMP 07/28/2010    SpO2 96%    BMI 29.12 kg/m  Gen:   Awake, no distress   Resp:  Normal effort  MSK:   Moves extremities without difficulty  Other:    Medical Decision Making  Medically screening exam initiated at 6:41 PM.  Appropriate orders placed.  Zanovia Rotz was informed that the remainder of the evaluation will be completed by another provider, this initial triage assessment does not replace that evaluation, and the importance of remaining in the ED until their evaluation is complete.     Kateri Plummer, PA-C 09/28/21 1845

## 2021-09-28 NOTE — Telephone Encounter (Signed)
Pt called to office with c/o "Windedness" on exertion present for "a couple of weeks, but worsening." Pt states she climbed one set of stairs in her home from her basement to main floor and felt as if she had climbed 3+ flights and was gasping for air. Pt states she has been experiencing a "tiny, jolt-like pain" in upper back between shoulder blades very frequently over last 3 weeks since helping son move into college. Pt states she gets lightheaded and slightly nauseous in association with this pain. Pt also condones a "cold sweat" with each episode. Pt states that the pain can also be felt just below her L collarbone. Pt states she has a history of vasospasms and partially blocked coronary artery, but that these symptoms are new for her and beginning to cause concern. Pt states that on 09/25/21, she was awakened from her sleep during the night by the pain and took 1 Nitroglycerin, which provided relief. Today while sweeping the floor at 10:30am, the pain returned and she took 1 nitro. After few minutes and rest, pain resolved. Again today at approx 12:45 when standing up from sitting on toiled, pain recurred and additional NTG was taken. Relief again. Pt denies n/v with episodes. Pt verbalized that when seen her in Sept, [facility] had mentioned doing a repeat cath (last in 2013), but they decided not to bc she was asymptomatic. Educated pt that with the new symptoms and descriptors she's providing sounding textbook for females experiencing a cardiac event, she should go to the ED. Pt agrees and states she will go to [facility] today, stating she would prefer to be proactive rather than wait for something big or terrible to occur.

## 2021-09-29 ENCOUNTER — Ambulatory Visit: Payer: BC Managed Care – PPO | Admitting: Cardiovascular Disease

## 2021-09-29 ENCOUNTER — Encounter: Payer: Self-pay | Admitting: Cardiovascular Disease

## 2021-09-29 ENCOUNTER — Telehealth: Payer: Self-pay | Admitting: Cardiovascular Disease

## 2021-09-29 VITALS — BP 122/60 | HR 87 | Ht 63.5 in | Wt 177.6 lb

## 2021-09-29 DIAGNOSIS — E7800 Pure hypercholesterolemia, unspecified: Secondary | ICD-10-CM | POA: Diagnosis not present

## 2021-09-29 DIAGNOSIS — I25110 Atherosclerotic heart disease of native coronary artery with unstable angina pectoris: Secondary | ICD-10-CM | POA: Diagnosis not present

## 2021-09-29 NOTE — Patient Instructions (Signed)
Medication Instructions:  Your physician recommends that you continue on your current medications as directed. Please refer to the Current Medication list given to you today.  *If you need a refill on your cardiac medications before your next appointment, please call your pharmacy*   Lab Work: None ordered   If you have labs (blood work) drawn today and your tests are completely normal, you will receive your results only by: [facility] (if you have MyChart) OR A paper copy in the mail If you have any lab test that is abnormal or we need to change your treatment, we will call you to review the results.   Testing/Procedures: Your physician has requested that you have a cardiac catheterization. Cardiac catheterization is used to diagnose and/or treat various heart conditions. Doctors may recommend this procedure for a number of different reasons. The most common reason is to evaluate chest pain. Chest pain can be a symptom of coronary artery disease (CAD), and cardiac catheterization can show whether plaque is narrowing or blocking your hearts arteries. This procedure is also used to evaluate the valves, as well as measure the blood flow and oxygen levels in different parts of your heart. For further information please visit [email or web address]. Please follow instruction sheet, as given.   Follow-Up: Follow up as scheduled    Other Instructions  [facility] OFFICE [place], [place] [address] Dept: [phone] Loc: [place]  09/29/2021  You are scheduled for a Cardiac Catheterization on Thursday, February 9 with Dr. Lauree Chandler.  1. Please arrive at the [facility] (Main Entrance A) at [facility]: [address] [place], [place] [address] at 7:00 AM (This time is two hours before your procedure to ensure your preparation). Free valet parking  service is available.   Special note: Every effort is made to have your procedure done on time. Please understand that emergencies sometimes delay scheduled procedures.  2. Diet: Do not eat solid foods after midnight.  The patient may have clear liquids until 5am upon the day of the procedure.  3. Medication instructions in preparation for your procedure:   On the morning of your procedure, take your Aspirin and any morning medicines NOT listed above.  You may use sips of water.  5. Plan for one night stay--bring personal belongings. 6. Bring a current list of your medications and current insurance cards. 7. You MUST have a responsible person to drive you home. 8. Someone MUST be with you the first 24 hours after you arrive home or your discharge will be delayed. 9. Please wear clothes that are easy to get on and off and wear slip-on shoes.  Thank you for allowing [place] to care for you!   -- [facility]

## 2021-09-29 NOTE — Progress Notes (Signed)
Chief Complaint  Patient presents with   Follow-up    Chest pain/CAD   History of Present Illness: 61 yo female with history of CAD, Prinzmetal angina, HLD, IBS, GERD who is here today for cardiac follow up. She had been followed by Dr. Mare Ferrari prior to his retirement. She has Prinzmetal angina controlled with Diltiazem and Isordil. Last cardiac cath in January 2013 at which time she was noted to have spasm in the ostial LAD with mild calcification, ostial Diagonal 50% stenosis, 20% mid Circumflex stenosis, 30% OM stenosis, 30% mid RCA stenosis. LVEF 55% by cath. ABI normal September 2017. Nuclear stress test in February 2018 with no ischemia. She was seen in the [facility] ED 02/19/20 with epigastric pain. Troponin negative. EKG without ischemic changes. She was seen in my office in September 2022 and was doing well. She has had recent chest pains. She was in the [facility] ED yesterday with chest pain and dyspnea. She left before being evaluated by a doctor. Troponin was negative x 2 and her EKG showed no ischemic changes.   She is here today for follow up. The patient denies palpitations, lower extremity edema, orthopnea, PND, dizziness, near syncope or syncope. She has been having exertional chest pain and dyspnea. This occurs daily. The pain resolves with rest.   Primary Care Physician: Pleas Koch, NP  Past Medical History:  Diagnosis Date   CAD (coronary artery disease)    has documented coronary vasospasm but with underlying 40% prox LAD, 30% prox LCX and 70 to 80% proximal 1st DX and 30% RCA; recurrent vasospasm with subsequent cath in Jan 2013.    Coronary vasospasm (HCC)    Environmental and seasonal allergies    Esophagitis    GERD (gastroesophageal reflux disease)    Hyperlipidemia    Hypertension    Hypothyroidism    IBS (irritable bowel syndrome)    Palpitations    tachycardia on holter; treated with beta blockers   Right ovarian cyst     Past Surgical History:   Procedure Laterality Date   APPENDECTOMY  1976   BREAST EXCISIONAL BIOPSY Right    benign   BREAST SURGERY Right 2002   biopsy   CARDIAC CATHETERIZATION  2011   CARDIAC CATHETERIZATION  Sept 2012   With documented vasospasm but with residual disease; managed medically   CHOLECYSTECTOMY  1982   open   LAPAROSCOPIC SALPINGO OOPHERECTOMY Right 09/18/2020   Procedure: LAPAROSCOPIC SALPINGO OOPHORECTOMY;  Surgeon: Aloha Gell, MD;  Location: [facility];  Service: Gynecology;  Laterality: Right;   LEFT HEART CATHETERIZATION WITH CORONARY ANGIOGRAM N/A 09/14/2011   Procedure: LEFT HEART CATHETERIZATION WITH CORONARY ANGIOGRAM;  Surgeon: Burnell Blanks, MD;  [facility];  Service: Cardiovascular;  Laterality: N/A;   UPPER GASTROINTESTINAL ENDOSCOPY  01/11/2011   esophagitis    Current Outpatient Medications  Medication Sig Dispense Refill   acetaminophen (TYLENOL) 500 MG tablet Take 500 mg by mouth every 6 (six) hours as needed. For pain     albuterol (VENTOLIN HFA) 108 (90 Base) MCG/ACT inhaler INHALE 2 PUFFS BY MOUTH EVERY 6 HOURS AS NEEDED 8.5 each 0   aspirin 81 MG tablet Take 81 mg by mouth daily.     atorvastatin (LIPITOR) 20 MG tablet TAKE 1 TABLET BY MOUTH EVERY DAY 90 tablet 2   Cholecalciferol (VITAMIN D) 2000 units CAPS Take 2000 units once per day. 30 capsule    diclofenac Sodium (VOLTAREN) 1 % GEL Apply 2-4  g topically 4 (four) times daily. 200 g 2   diltiazem (TIAZAC) 300 MG 24 hr capsule TAKE 1 CAPSULE BY MOUTH EVERY DAY 90 capsule 3   fluticasone (FLONASE) 50 MCG/ACT nasal spray Place 2 sprays into both nostrils daily. 16 g 6   HYDROcodone-acetaminophen (NORCO/VICODIN) 5-325 MG tablet Take 1-2 tablets by mouth every 6 (six) hours as needed for moderate pain. (Patient not taking: Reported on 09/29/2021) 30 tablet 0   ibuprofen (ADVIL) 800 MG tablet Take 1 tablet (800 mg total) by mouth every 8 (eight) hours as needed. 60 tablet 1   isosorbide  dinitrate (ISORDIL) 30 MG tablet TAKE 1 TABLET BY MOUTH THREE TIMES A DAY 270 tablet 2   levothyroxine (SYNTHROID) 50 MCG tablet Take 1 tablet by mouth every morning on an empty stomach with water only.  No food or other medications for 30 minutes. Office visit required for further refills. 30 tablet 0   nitroGLYCERIN (NITROSTAT) 0.4 MG SL tablet PLACE 1 TABLET (0.4 MG TOTAL) UNDER THE TONGUE EVERY 5 (FIVE) MINUTES AS NEEDED FOR CHEST PAIN. X 3 DOSES 25 tablet 3   sertraline (ZOLOFT) 50 MG tablet TAKE 1 TABLET BY MOUTH EVERYDAY AT BEDTIME for anxiety. Office visit required for further refills. 30 tablet 0   No current facility-administered medications for this visit.    No Known Allergies  Social History   Socioeconomic History   Marital status: Married    Spouse name: Not on file   Number of children: 2   Years of education: Not on file   Highest education level: Not on file  Occupational History   Occupation: Unemployed  Tobacco Use   Smoking status: Former    Packs/day: 0.25    Years: 30.00    Pack years: 7.50    Types: Cigarettes    Quit date: 05/30/2014    Years since quitting: 7.3   Smokeless tobacco: Never  Vaping Use   Vaping Use: Never used  Substance and Sexual Activity   Alcohol use: Yes    Alcohol/week: 2.0 standard drinks    Types: 2 Standard drinks or equivalent per week    Comment: occ   Drug use: No   Sexual activity: Yes  Other Topics Concern   Not on file  Social History Narrative   Married.   2 children.   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   She enjoys being with her dogs, shopping.    Social Determinants of Health   Financial Resource Strain: Not on file  Food Insecurity: Not on file  Transportation Needs: Not on file  Physical Activity: Not on file  Stress: Not on file  Social Connections: Not on file  Intimate Partner Violence: Not on file    Family History  Problem Relation Age of Onset   Breast cancer Mother 21   Heart disease Mother         died CHF 9   Diabetes Mother    Prostate cancer Father    Hyperlipidemia Father    Heart disease Father        s/p cabg - alive   Colon cancer Other        First cousin on dads side    Thyroid disease Maternal Grandmother     Review of Systems:  As stated in the HPI and otherwise negative.   BP 122/60    Pulse 87    Ht 5' 3.5" (1.613 m)    Wt 177 lb 9.6 oz (80.6 kg)  LMP 07/28/2010    SpO2 97%    BMI 30.97 kg/m   Physical Examination: General: Well developed, well nourished, NAD  HEENT: OP clear, mucus membranes moist  SKIN: warm, dry. No rashes. Neuro: No focal deficits  Musculoskeletal: Muscle strength 5/5 all ext  Psychiatric: Mood and affect normal  Neck: No JVD, no carotid bruits, no thyromegaly, no lymphadenopathy.  Lungs:Clear bilaterally, no wheezes, rhonci, crackles Cardiovascular: Regular rate and rhythm. No murmurs, gallops or rubs. Abdomen:Soft. Bowel sounds present. Non-tender.  Extremities: No lower extremity edema. Pulses are 2 + in the bilateral DP/PT.  Cardiac cath September 12, 2011: Left main: No obstructive disease. As noted above, pt with intense vasospasm at beginning of case.  Left Anterior Descending Artery: Ostial spasm however after NTG given, there is minimal obstructive disease in the ostium with mild calcification. The diagonal has ostial 50% stenosis, unchanged from previous cath.  Circumflex Artery: During initial injections, there is a 99% mid stenosis, however, after NTG given this area responded well with minimal 20% stenosis. The proximal portion of the OM 1 also had 30% stenosis.  Right Coronary Artery: Large, dominant vessel with 30% mid stenosis.  Left Ventricular Angiogram: LVEF=55%.   EKG:  EKG is not ordered today. The ekg ordered today demonstrates  EKG from yesterday reviewed and shows sinus rhythm without ischemia  Recent Labs: 10/24/2020: ALT 22 09/28/2021: BUN 12; Creatinine, Ser 0.82; Hemoglobin 14.9; Platelets 203;  Potassium 3.6; Sodium 137   Lipid Panel    Component Value Date/Time   CHOL 168 09/10/2020 1509   CHOL 168 03/13/2019 1411   TRIG 95.0 09/10/2020 1509   HDL 81.50 09/10/2020 1509   HDL 89 03/13/2019 1411   CHOLHDL 2 09/10/2020 1509   VLDL 19.0 09/10/2020 1509   LDLCALC 68 09/10/2020 1509   LDLCALC 62 03/13/2019 1411     Wt Readings from Last 3 Encounters:  09/29/21 177 lb 9.6 oz (80.6 kg)  09/28/21 167 lb (75.8 kg)  05/21/21 176 lb 6.4 oz (80 kg)     Other studies Reviewed: Additional studies/ records that were reviewed today include: . Review of the above records demonstrates:   Assessment and Plan:   1. CAD with unstable angina/suspected coronary vasospasm: She has moderate CAD by cath in 2013 with documented coronary vasospasm. She is having recurrent chest pain now with dyspnea. Negative troponin in the ED yesterday x 2 with no ischemic EKG changes.  Will arrange cardiac cath  at [facility] 10/08/21 at 9am.  I have reviewed the risks, indications, and alternatives to cardiac catheterization, possible angioplasty, and stenting with the patient. Risks include but are not limited to bleeding, infection, vascular injury, stroke, myocardial infection, arrhythmia, kidney injury, radiation-related injury in the case of prolonged fluoroscopy use, emergency cardiac surgery, and death. The patient understands the risks of serious complication is 1-2 in 2751 with diagnostic cardiac cath and 1-2% or less with angioplasty/stenting. .  Continue ASA, Isordil, Diltiazem and statin.    2. Hyperlipidemia: Lipids followed in primary care. LDL 68 in 2022.  Continue statin.     Current medicines are reviewed at length with the patient today.  The patient does not have concerns regarding medicines.  The following changes have been made:  no change  Labs/ tests ordered today include:   No orders of the defined types were placed in this encounter.    Disposition:   F/U with me in 12  months  Signed, Lauree Chandler, MD 09/29/2021 11:38 AM  [facility] [place], [place], [place]  [address] Phone: [phone]; Fax: [phone]

## 2021-09-29 NOTE — ED Notes (Signed)
Patient left on own accord °

## 2021-09-29 NOTE — H&P (View-Only) (Signed)
Chief Complaint  Patient presents with   Follow-up    Chest pain/CAD   History of Present Illness: 61 yo female with history of CAD, Prinzmetal angina, HLD, IBS, GERD who is here today for cardiac follow up. She had been followed by Dr. Mare Ferrari prior to his retirement. She has Prinzmetal angina controlled with Diltiazem and Isordil. Last cardiac cath in January 2013 at which time she was noted to have spasm in the ostial LAD with mild calcification, ostial Diagonal 50% stenosis, 20% mid Circumflex stenosis, 30% OM stenosis, 30% mid RCA stenosis. LVEF 55% by cath. ABI normal September 2017. Nuclear stress test in February 2018 with no ischemia. She was seen in the [facility] ED 02/19/20 with epigastric pain. Troponin negative. EKG without ischemic changes. She was seen in my office in September 2022 and was doing well. She has had recent chest pains. She was in the [facility] ED yesterday with chest pain and dyspnea. She left before being evaluated by a doctor. Troponin was negative x 2 and her EKG showed no ischemic changes.   She is here today for follow up. The patient denies palpitations, lower extremity edema, orthopnea, PND, dizziness, near syncope or syncope. She has been having exertional chest pain and dyspnea. This occurs daily. The pain resolves with rest.   Primary Care Physician: Pleas Koch, NP  Past Medical History:  Diagnosis Date   CAD (coronary artery disease)    has documented coronary vasospasm but with underlying 40% prox LAD, 30% prox LCX and 70 to 80% proximal 1st DX and 30% RCA; recurrent vasospasm with subsequent cath in Jan 2013.    Coronary vasospasm (HCC)    Environmental and seasonal allergies    Esophagitis    GERD (gastroesophageal reflux disease)    Hyperlipidemia    Hypertension    Hypothyroidism    IBS (irritable bowel syndrome)    Palpitations    tachycardia on holter; treated with beta blockers   Right ovarian cyst     Past Surgical History:   Procedure Laterality Date   APPENDECTOMY  1976   BREAST EXCISIONAL BIOPSY Right    benign   BREAST SURGERY Right 2002   biopsy   CARDIAC CATHETERIZATION  2011   CARDIAC CATHETERIZATION  Sept 2012   With documented vasospasm but with residual disease; managed medically   CHOLECYSTECTOMY  1982   open   LAPAROSCOPIC SALPINGO OOPHERECTOMY Right 09/18/2020   Procedure: LAPAROSCOPIC SALPINGO OOPHORECTOMY;  Surgeon: Aloha Gell, MD;  Location: [place];  Service: Gynecology;  Laterality: Right;   LEFT HEART CATHETERIZATION WITH CORONARY ANGIOGRAM N/A 09/14/2011   Procedure: LEFT HEART CATHETERIZATION WITH CORONARY ANGIOGRAM;  Surgeon: Burnell Blanks, MD;  Location: [facility] CATH LAB;  Service: Cardiovascular;  Laterality: N/A;   UPPER GASTROINTESTINAL ENDOSCOPY  01/11/2011   esophagitis    Current Outpatient Medications  Medication Sig Dispense Refill   acetaminophen (TYLENOL) 500 MG tablet Take 500 mg by mouth every 6 (six) hours as needed. For pain     albuterol (VENTOLIN HFA) 108 (90 Base) MCG/ACT inhaler INHALE 2 PUFFS BY MOUTH EVERY 6 HOURS AS NEEDED 8.5 each 0   aspirin 81 MG tablet Take 81 mg by mouth daily.     atorvastatin (LIPITOR) 20 MG tablet TAKE 1 TABLET BY MOUTH EVERY DAY 90 tablet 2   Cholecalciferol (VITAMIN D) 2000 units CAPS Take 2000 units once per day. 30 capsule    diclofenac Sodium (VOLTAREN) 1 % GEL Apply 2-4  g topically 4 (four) times daily. 200 g 2   diltiazem (TIAZAC) 300 MG 24 hr capsule TAKE 1 CAPSULE BY MOUTH EVERY DAY 90 capsule 3   fluticasone (FLONASE) 50 MCG/ACT nasal spray Place 2 sprays into both nostrils daily. 16 g 6   HYDROcodone-acetaminophen (NORCO/VICODIN) 5-325 MG tablet Take 1-2 tablets by mouth every 6 (six) hours as needed for moderate pain. (Patient not taking: Reported on 09/29/2021) 30 tablet 0   ibuprofen (ADVIL) 800 MG tablet Take 1 tablet (800 mg total) by mouth every 8 (eight) hours as needed. 60 tablet 1   isosorbide  dinitrate (ISORDIL) 30 MG tablet TAKE 1 TABLET BY MOUTH THREE TIMES A DAY 270 tablet 2   levothyroxine (SYNTHROID) 50 MCG tablet Take 1 tablet by mouth every morning on an empty stomach with water only.  No food or other medications for 30 minutes. Office visit required for further refills. 30 tablet 0   nitroGLYCERIN (NITROSTAT) 0.4 MG SL tablet PLACE 1 TABLET (0.4 MG TOTAL) UNDER THE TONGUE EVERY 5 (FIVE) MINUTES AS NEEDED FOR CHEST PAIN. X 3 DOSES 25 tablet 3   sertraline (ZOLOFT) 50 MG tablet TAKE 1 TABLET BY MOUTH EVERYDAY AT BEDTIME for anxiety. Office visit required for further refills. 30 tablet 0   No current facility-administered medications for this visit.    No Known Allergies  Social History   Socioeconomic History   Marital status: Married    Spouse name: Not on file   Number of children: 2   Years of education: Not on file   Highest education level: Not on file  Occupational History   Occupation: Unemployed  Tobacco Use   Smoking status: Former    Packs/day: 0.25    Years: 30.00    Pack years: 7.50    Types: Cigarettes    Quit date: 05/30/2014    Years since quitting: 7.3   Smokeless tobacco: Never  Vaping Use   Vaping Use: Never used  Substance and Sexual Activity   Alcohol use: Yes    Alcohol/week: 2.0 standard drinks    Types: 2 Standard drinks or equivalent per week    Comment: occ   Drug use: No   Sexual activity: Yes  Other Topics Concern   Not on file  Social History Narrative   Married.   2 children.   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   She enjoys being with her dogs, shopping.    Social Determinants of Health   Financial Resource Strain: Not on file  Food Insecurity: Not on file  Transportation Needs: Not on file  Physical Activity: Not on file  Stress: Not on file  Social Connections: Not on file  Intimate Partner Violence: Not on file    Family History  Problem Relation Age of Onset   Breast cancer Mother 2   Heart disease Mother         died CHF 16   Diabetes Mother    Prostate cancer Father    Hyperlipidemia Father    Heart disease Father        s/p cabg - alive   Colon cancer Other        First cousin on dads side    Thyroid disease Maternal Grandmother     Review of Systems:  As stated in the HPI and otherwise negative.   BP 122/60    Pulse 87    Ht 5' 3.5" (1.613 m)    Wt 177 lb 9.6 oz (80.6 kg)  LMP 07/28/2010    SpO2 97%    BMI 30.97 kg/m   Physical Examination: General: Well developed, well nourished, NAD  HEENT: OP clear, mucus membranes moist  SKIN: warm, dry. No rashes. Neuro: No focal deficits  Musculoskeletal: Muscle strength 5/5 all ext  Psychiatric: Mood and affect normal  Neck: No JVD, no carotid bruits, no thyromegaly, no lymphadenopathy.  Lungs:Clear bilaterally, no wheezes, rhonci, crackles Cardiovascular: Regular rate and rhythm. No murmurs, gallops or rubs. Abdomen:Soft. Bowel sounds present. Non-tender.  Extremities: No lower extremity edema. Pulses are 2 + in the bilateral DP/PT.  Cardiac cath September 12, 2011: Left main: No obstructive disease. As noted above, pt with intense vasospasm at beginning of case.  Left Anterior Descending Artery: Ostial spasm however after NTG given, there is minimal obstructive disease in the ostium with mild calcification. The diagonal has ostial 50% stenosis, unchanged from previous cath.  Circumflex Artery: During initial injections, there is a 99% mid stenosis, however, after NTG given this area responded well with minimal 20% stenosis. The proximal portion of the OM 1 also had 30% stenosis.  Right Coronary Artery: Large, dominant vessel with 30% mid stenosis.  Left Ventricular Angiogram: LVEF=55%.   EKG:  EKG is not ordered today. The ekg ordered today demonstrates  EKG from yesterday reviewed and shows sinus rhythm without ischemia  Recent Labs: 10/24/2020: ALT 22 09/28/2021: BUN 12; Creatinine, Ser 0.82; Hemoglobin 14.9; Platelets 203;  Potassium 3.6; Sodium 137   Lipid Panel    Component Value Date/Time   CHOL 168 09/10/2020 1509   CHOL 168 03/13/2019 1411   TRIG 95.0 09/10/2020 1509   HDL 81.50 09/10/2020 1509   HDL 89 03/13/2019 1411   CHOLHDL 2 09/10/2020 1509   VLDL 19.0 09/10/2020 1509   LDLCALC 68 09/10/2020 1509   LDLCALC 62 03/13/2019 1411     Wt Readings from Last 3 Encounters:  09/29/21 177 lb 9.6 oz (80.6 kg)  09/28/21 167 lb (75.8 kg)  05/21/21 176 lb 6.4 oz (80 kg)     Other studies Reviewed: Additional studies/ records that were reviewed today include: . Review of the above records demonstrates:   Assessment and Plan:   1. CAD with unstable angina/suspected coronary vasospasm: She has moderate CAD by cath in 2013 with documented coronary vasospasm. She is having recurrent chest pain now with dyspnea. Negative troponin in the ED yesterday x 2 with no ischemic EKG changes.  Will arrange cardiac cath  at [facility] 10/08/21 at 9am.  I have reviewed the risks, indications, and alternatives to cardiac catheterization, possible angioplasty, and stenting with the patient. Risks include but are not limited to bleeding, infection, vascular injury, stroke, myocardial infection, arrhythmia, kidney injury, radiation-related injury in the case of prolonged fluoroscopy use, emergency cardiac surgery, and death. The patient understands the risks of serious complication is 1-2 in 4332 with diagnostic cardiac cath and 1-2% or less with angioplasty/stenting. .  Continue ASA, Isordil, Diltiazem and statin.    2. Hyperlipidemia: Lipids followed in primary care. LDL 68 in 2022.  Continue statin.     Current medicines are reviewed at length with the patient today.  The patient does not have concerns regarding medicines.  The following changes have been made:  no change  Labs/ tests ordered today include:   No orders of the defined types were placed in this encounter.    Disposition:   F/U with me in 12  months  Signed, Lauree Chandler, MD 09/29/2021 11:38 AM  [facility] [address], [place], [place]  [address] Phone: [phone]; Fax: [phone]

## 2021-09-29 NOTE — Telephone Encounter (Signed)
Patient would like to speak to the nurse she spoke to yesterday. See phone dated 09/28/21.

## 2021-09-29 NOTE — Telephone Encounter (Signed)
Spoke with Dr. Angelena Form and he said he would see her at 11am today.  Spoke with pt and she is agreeable to plan.

## 2021-10-02 DIAGNOSIS — Z01818 Encounter for other preprocedural examination: Secondary | ICD-10-CM | POA: Diagnosis not present

## 2021-10-02 DIAGNOSIS — H2512 Age-related nuclear cataract, left eye: Secondary | ICD-10-CM | POA: Diagnosis not present

## 2021-10-02 DIAGNOSIS — H2511 Age-related nuclear cataract, right eye: Secondary | ICD-10-CM | POA: Diagnosis not present

## 2021-10-07 ENCOUNTER — Telehealth: Payer: Self-pay | Admitting: *Deleted

## 2021-10-07 NOTE — Telephone Encounter (Signed)
Cardiac catheterization scheduled at [facility] for: Thursday October 08, 2021 [facility] Main Entrance [facility]) at: 7 AM   Diet-no solid food after midnight prior to cath, clear liquids until 5 AM day of procedure.  Medication instructions for procedure: -Usual morning medications can be taken pre-cath with sips of water including aspirin 81 mg.    Must have responsible adult to drive home post procedure and be with patient first 24 hours after arriving home.  [facility] does allow one visitor to accompany you and wait in the hospital waiting room while you are there for your procedure. You and your visitor will be asked to wear a mask once you enter the hospital.   Patient reports does not currently have any new symptoms concerning for COVID-19 and no household members with COVID-19 like illness.    Reviewed procedure instructions with patient.

## 2021-10-08 ENCOUNTER — Ambulatory Visit (HOSPITAL_COMMUNITY)
Admission: RE | Admit: 2021-10-08 | Discharge: 2021-10-08 | Disposition: A | Discharge status: Home / Self Care | Payer: BC Managed Care – PPO | Attending: Cardiovascular Disease | Admitting: Cardiovascular Disease

## 2021-10-08 ENCOUNTER — Ambulatory Visit (HOSPITAL_COMMUNITY)
Admission: RE | Disposition: A | Discharge status: Home / Self Care | Payer: Self-pay | Source: Home / Self Care | Attending: Cardiovascular Disease

## 2021-10-08 ENCOUNTER — Other Ambulatory Visit: Payer: Self-pay

## 2021-10-08 ENCOUNTER — Encounter (HOSPITAL_COMMUNITY): Payer: Self-pay | Admitting: Cardiovascular Disease

## 2021-10-08 DIAGNOSIS — Z87891 Personal history of nicotine dependence: Secondary | ICD-10-CM | POA: Diagnosis not present

## 2021-10-08 DIAGNOSIS — K2100 Gastro-esophageal reflux disease with esophagitis, without bleeding: Secondary | ICD-10-CM | POA: Diagnosis not present

## 2021-10-08 DIAGNOSIS — K589 Irritable bowel syndrome without diarrhea: Secondary | ICD-10-CM | POA: Insufficient documentation

## 2021-10-08 DIAGNOSIS — I25110 Atherosclerotic heart disease of native coronary artery with unstable angina pectoris: Secondary | ICD-10-CM | POA: Diagnosis not present

## 2021-10-08 DIAGNOSIS — I25118 Atherosclerotic heart disease of native coronary artery with other forms of angina pectoris: Secondary | ICD-10-CM | POA: Diagnosis not present

## 2021-10-08 DIAGNOSIS — E785 Hyperlipidemia, unspecified: Secondary | ICD-10-CM | POA: Insufficient documentation

## 2021-10-08 DIAGNOSIS — I25111 Atherosclerotic heart disease of native coronary artery with angina pectoris with documented spasm: Secondary | ICD-10-CM | POA: Diagnosis not present

## 2021-10-08 HISTORY — PX: LEFT HEART CATH AND CORONARY ANGIOGRAPHY: CATH118249

## 2021-10-08 SURGERY — LEFT HEART CATH AND CORONARY ANGIOGRAPHY
Anesthesia: LOCAL

## 2021-10-08 MED ORDER — LIDOCAINE HCL (PF) 1 % IJ SOLN
INTRAMUSCULAR | Status: DC | PRN
  Administered 2021-10-08: 2 mL

## 2021-10-08 MED ORDER — VERAPAMIL HCL 2.5 MG/ML IV SOLN
INTRAVENOUS | Status: AC
  Filled 2021-10-08: qty 2

## 2021-10-08 MED ORDER — SODIUM CHLORIDE 0.9 % IV SOLN: INTRAVENOUS | Status: AC

## 2021-10-08 MED ORDER — SODIUM CHLORIDE 0.9 % IV SOLN: 250.0000 mL | INTRAVENOUS | Status: DC | PRN

## 2021-10-08 MED ORDER — SODIUM CHLORIDE 0.9 % WEIGHT BASED INFUSION
3.0000 mL/kg/h | INTRAVENOUS | Status: AC
  Administered 2021-10-08: 3 mL/kg/h via INTRAVENOUS

## 2021-10-08 MED ORDER — HEPARIN SODIUM (PORCINE) 1000 UNIT/ML IJ SOLN
INTRAMUSCULAR | Status: AC
  Filled 2021-10-08: qty 10

## 2021-10-08 MED ORDER — FENTANYL CITRATE (PF) 100 MCG/2ML IJ SOLN
INTRAMUSCULAR | Status: DC | PRN
  Administered 2021-10-08: 25 ug via INTRAVENOUS
  Administered 2021-10-08: 50 ug via INTRAVENOUS

## 2021-10-08 MED ORDER — MIDAZOLAM HCL 2 MG/2ML IJ SOLN
INTRAMUSCULAR | Status: AC
  Filled 2021-10-08: qty 2

## 2021-10-08 MED ORDER — HEPARIN (PORCINE) IN NACL 1000-0.9 UT/500ML-% IV SOLN
INTRAVENOUS | Status: AC
  Filled 2021-10-08: qty 1000

## 2021-10-08 MED ORDER — SODIUM CHLORIDE 0.9% FLUSH: 3.0000 mL | Freq: Two times a day (BID) | INTRAVENOUS | Status: DC

## 2021-10-08 MED ORDER — SODIUM CHLORIDE 0.9 % WEIGHT BASED INFUSION: 1.0000 mL/kg/h | INTRAVENOUS | Status: DC

## 2021-10-08 MED ORDER — FENTANYL CITRATE (PF) 100 MCG/2ML IJ SOLN
INTRAMUSCULAR | Status: AC
  Filled 2021-10-08: qty 2

## 2021-10-08 MED ORDER — MIDAZOLAM HCL 2 MG/2ML IJ SOLN
INTRAMUSCULAR | Status: DC | PRN
  Administered 2021-10-08: 2 mg via INTRAVENOUS
  Administered 2021-10-08: 1 mg via INTRAVENOUS

## 2021-10-08 MED ORDER — ACETAMINOPHEN 325 MG PO TABS: 650.0000 mg | ORAL_TABLET | ORAL | Status: DC | PRN

## 2021-10-08 MED ORDER — SODIUM CHLORIDE 0.9% FLUSH: 3.0000 mL | INTRAVENOUS | Status: DC | PRN

## 2021-10-08 MED ORDER — ASPIRIN 81 MG PO CHEW: 81.0000 mg | CHEWABLE_TABLET | ORAL | Status: DC

## 2021-10-08 MED ORDER — LIDOCAINE HCL (PF) 1 % IJ SOLN
INTRAMUSCULAR | Status: AC
  Filled 2021-10-08: qty 30

## 2021-10-08 MED ORDER — VERAPAMIL HCL 2.5 MG/ML IV SOLN
INTRAVENOUS | Status: DC | PRN
  Administered 2021-10-08: 10 mL via INTRA_ARTERIAL

## 2021-10-08 MED ORDER — HYDRALAZINE HCL 20 MG/ML IJ SOLN: 10.0000 mg | INTRAMUSCULAR | Status: DC | PRN

## 2021-10-08 MED ORDER — LABETALOL HCL 5 MG/ML IV SOLN: 10.0000 mg | INTRAVENOUS | Status: DC | PRN

## 2021-10-08 MED ORDER — IOHEXOL 350 MG/ML SOLN
INTRAVENOUS | Status: DC | PRN
  Administered 2021-10-08: 45 mL

## 2021-10-08 MED ORDER — HEPARIN (PORCINE) IN NACL 1000-0.9 UT/500ML-% IV SOLN
INTRAVENOUS | Status: DC | PRN
  Administered 2021-10-08 (×2): 500 mL

## 2021-10-08 MED ORDER — HEPARIN SODIUM (PORCINE) 1000 UNIT/ML IJ SOLN
INTRAMUSCULAR | Status: DC | PRN
  Administered 2021-10-08: 4000 [IU] via INTRAVENOUS

## 2021-10-08 MED ORDER — NITROGLYCERIN 1 MG/10 ML FOR IR/CATH LAB
INTRA_ARTERIAL | Status: AC
  Filled 2021-10-08: qty 10

## 2021-10-08 MED ORDER — ONDANSETRON HCL 4 MG/2ML IJ SOLN: 4.0000 mg | Freq: Four times a day (QID) | INTRAMUSCULAR | Status: DC | PRN

## 2021-10-08 SURGICAL SUPPLY — 9 items

## 2021-10-08 NOTE — Interval H&P Note (Signed)
History and Physical Interval Note:  10/08/2021 8:56 AM  Caroline [name]  has presented today for surgery, with the diagnosis of chest pain.  The various methods of treatment have been discussed with the patient and family. After consideration of risks, benefits and other options for treatment, the patient has consented to  Procedure(s): LEFT HEART CATH AND CORONARY ANGIOGRAPHY (N/A) as a surgical intervention.  The patient's history has been reviewed, patient examined, no change in status, stable for surgery.  I have reviewed the patient's chart and labs.  Questions were answered to the patient's satisfaction.    Cath Lab Visit (complete for each Cath Lab visit)  Clinical Evaluation Leading to the Procedure:   ACS: No.  Non-ACS:    Anginal Classification: CCS III  Anti-ischemic medical therapy: Maximal Therapy (2 or more classes of medications)  Non-Invasive Test Results: No non-invasive testing performed  Prior CABG: No previous CABG        Lauree Chandler

## 2021-10-09 ENCOUNTER — Telehealth: Payer: Self-pay | Admitting: *Deleted

## 2021-10-09 MED FILL — Nitroglycerin IV Soln 100 MCG/ML in D5W: INTRA_ARTERIAL | Qty: 10 | Status: AC

## 2021-10-09 NOTE — Telephone Encounter (Signed)
° °  Pre-operative Risk Assessment    Patient Name: Caroline [name]  DOB: 12-31-1960 MRN: [id number]      Request for Surgical Clearance    Procedure:   CATARACT EXTRACTION BY PE, IOL-LEFT EYE THEN RIGHT EYE  Date of Surgery:  Clearance TBD                                 Surgeon:  DR. Julian Reil Surgeon's Group or Practice Name:  [facility] Phone number:  [phone] EXT [phone] Fax number:  [phone]   Type of Clearance Requested:   - Medical  (NO MEDICATIONS ARE BEING REQUESTED TO BE HELD)   Type of Anesthesia:   IV SEDATION   Additional requests/questions:    Jiles Prows   10/09/2021, 5:33 PM

## 2021-10-11 NOTE — Telephone Encounter (Signed)
° °  Patient Name: Caroline [name]  DOB: Jun 07, 1961 MRN: [id number]  Primary Cardiologist: Lauree Chandler, MD  Chart reviewed as part of pre-operative protocol coverage. Cataract extractions are recognized in guidelines as low risk surgeries that do not typically require specific preoperative testing or holding of blood thinner therapy. Therefore, given past medical history and time since last visit, based on ACC/AHA guidelines, Caroline [name] would be at acceptable risk for the planned procedure without further cardiovascular testing.   I will route this recommendation to the requesting party via Epic fax function and remove from pre-op pool.  Please call with questions.  [place], [place] 10/11/2021, 4:27 PM

## 2021-10-12 NOTE — Progress Notes (Signed)
Cardiology Office Note    Date:  10/20/2021   ID:  Caroline [name], DOB 11-12-1960, MRN [id number]   PCP:  Pleas Koch, NP   [place]  Cardiologist:  Lauree Chandler, MD   Advanced Practice Provider:  No care team member to display Electrophysiologist:  None   [phone]   Chief Complaint  Patient presents with   Hospitalization Follow-up    History of Present Illness:  Caroline [name] is a 61 y.o. female patient with CAD, Prinzmetal angina.  Last cath in 2013 noted to have spasm in the ostial LAD with mild calcification, ostial diagonal 50% stenosis, 20% mid circumflex and 30% OM, 30% mid RCA, LVEF 55%.  NST 09/2016 no ischemia.  Patient also has hyperlipidemia, IBS and GERD ABI normal September 2017. Nuclear stress test in February 2018 with no ischemia.  Patient saw Dr. Angelena Form 09/29/2021 with recurrent chest pain and he recommended cardiac catheterization which was performed 10/08/2021.  This showed nonobstructive CAD and most likely coronary spasm.  Recommended continuing medical therapy.  Patient comes in for hosp f/u.  No further chest pain. Walking 1 mile every other day. Up and down stairs all the time.     Past Medical History:  Diagnosis Date   CAD (coronary artery disease)    has documented coronary vasospasm but with underlying 40% prox LAD, 30% prox LCX and 70 to 80% proximal 1st DX and 30% RCA; recurrent vasospasm with subsequent cath in Jan 2013.    Coronary vasospasm (HCC)    Environmental and seasonal allergies    Esophagitis    GERD (gastroesophageal reflux disease)    Hyperlipidemia    Hypertension    Hypothyroidism    IBS (irritable bowel syndrome)    Palpitations    tachycardia on holter; treated with beta blockers   Right ovarian cyst     Past Surgical History:  Procedure Laterality Date   APPENDECTOMY  1976   BREAST EXCISIONAL BIOPSY Right    benign   BREAST SURGERY Right 2002   biopsy   CARDIAC  CATHETERIZATION  2011   CARDIAC CATHETERIZATION  Sept 2012   With documented vasospasm but with residual disease; managed medically   CHOLECYSTECTOMY  1982   open   LAPAROSCOPIC SALPINGO OOPHERECTOMY Right 09/18/2020   Procedure: LAPAROSCOPIC SALPINGO OOPHORECTOMY;  Surgeon: Aloha Gell, MD;  Location: [facility];  Service: Gynecology;  Laterality: Right;   LEFT HEART CATH AND CORONARY ANGIOGRAPHY N/A 10/08/2021   Procedure: LEFT HEART CATH AND CORONARY ANGIOGRAPHY;  Surgeon: Burnell Blanks, MD;  Location: [facility];  Service: Cardiovascular;  Laterality: N/A;   LEFT HEART CATHETERIZATION WITH CORONARY ANGIOGRAM N/A 09/14/2011   Procedure: LEFT HEART CATHETERIZATION WITH CORONARY ANGIOGRAM;  Surgeon: Burnell Blanks, MD;  Location: [facility] CATH [facility];  Service: Cardiovascular;  Laterality: N/A;   UPPER GASTROINTESTINAL ENDOSCOPY  01/11/2011   esophagitis    Current Medications: Current Meds  Medication Sig   acetaminophen (TYLENOL) 500 MG tablet Take 500 mg by mouth every 6 (six) hours as needed. For pain   albuterol (VENTOLIN HFA) 108 (90 Base) MCG/ACT inhaler INHALE 2 PUFFS BY MOUTH EVERY 6 HOURS AS NEEDED   aspirin 81 MG tablet Take 81 mg by mouth daily.   atorvastatin (LIPITOR) 20 MG tablet TAKE 1 TABLET BY MOUTH EVERY DAY   Cholecalciferol (VITAMIN D) 2000 units CAPS Take 2000 units once per day.   Cyanocobalamin (VITAMIN B-12 PO) Take by mouth.  diclofenac Sodium (VOLTAREN) 1 % GEL Apply 2-4 g topically 4 (four) times daily. (Patient taking differently: Apply 2-4 g topically daily as needed (pain).)   diltiazem (TIAZAC) 300 MG 24 hr capsule TAKE 1 CAPSULE BY MOUTH EVERY DAY   fluticasone (FLONASE) 50 MCG/ACT nasal spray Place 2 sprays into both nostrils daily. (Patient taking differently: Place 2 sprays into both nostrils daily as needed for allergies.)   isosorbide dinitrate (ISORDIL) 30 MG tablet TAKE 1 TABLET BY MOUTH THREE TIMES A DAY    levocetirizine (XYZAL) 5 MG tablet Take 1 tablet (5 mg total) by mouth every evening. For allergies   levothyroxine (SYNTHROID) 50 MCG tablet Take 1 tablet by mouth every morning on an empty stomach with water only.  No food or other medications for 30 minutes.   nitroGLYCERIN (NITROSTAT) 0.4 MG SL tablet PLACE 1 TABLET (0.4 MG TOTAL) UNDER THE TONGUE EVERY 5 (FIVE) MINUTES AS NEEDED FOR CHEST PAIN. X 3 DOSES   Polyethyl Glycol-Propyl Glycol (SYSTANE) 0.4-0.3 % SOLN Place 1 drop into both eyes in the morning, at noon, in the evening, and at bedtime.   sertraline (ZOLOFT) 100 MG tablet Take 1 tablet (100 mg total) by mouth daily. For anxiety and depression     Allergies:   Patient has no known allergies.   Social History   Socioeconomic History   Marital status: Married    Spouse name: Not on file   Number of children: 2   Years of education: Not on file   Highest education level: Not on file  Occupational History   Occupation: Unemployed  Tobacco Use   Smoking status: Former    Packs/day: 0.25    Years: 30.00    Pack years: 7.50    Types: Cigarettes    Quit date: 05/30/2014    Years since quitting: 7.3   Smokeless tobacco: Never  Vaping Use   Vaping Use: Never used  Substance and Sexual Activity   Alcohol use: Yes    Alcohol/week: 2.0 standard drinks    Types: 2 Standard drinks or equivalent per week    Comment: occ   Drug use: No   Sexual activity: Yes  Other Topics Concern   Not on file  Social History Narrative   Married.   2 children.   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   She enjoys being with her dogs, shopping.    Social Determinants of Health   Financial Resource Strain: Not on file  Food Insecurity: Not on file  Transportation Needs: Not on file  Physical Activity: Not on file  Stress: Not on file  Social Connections: Not on file     Family History:  The patient's  family history includes Breast cancer (age of onset: 2) in her mother; Colon cancer in an  other family member; Diabetes in her mother; Heart disease in her father and mother; Hyperlipidemia in her father; Prostate cancer in her father; Thyroid disease in her maternal grandmother.   ROS:   Please see the history of present illness.    ROS All other systems reviewed and are negative.   PHYSICAL EXAM:   VS:  BP 138/68    Pulse 92    Ht 5\' 4"  (1.626 m)    Wt 171 lb (77.6 kg)    LMP 07/28/2010    SpO2 95%    BMI 29.35 kg/m   Physical Exam  GEN: Well nourished, well developed, in no acute distress  Neck: no JVD, carotid bruits, or  masses Cardiac:RRR; no murmurs, rubs, or gallops  Respiratory:  clear to auscultation bilaterally, normal work of breathing GI: soft, nontender, nondistended, + BS Ext: right arm at cath site with small hematoma, good radial and brachial pulse lower ext.without cyanosis, clubbing, or edema, Good distal pulses bilaterally Neuro:  Alert and Oriented x 3, Psych: euthymic mood, full affect  Wt Readings from Last 3 Encounters:  10/20/21 171 lb (77.6 kg)  10/16/21 176 lb (79.8 kg)  10/08/21 166 lb (75.3 kg)      Studies/Labs Reviewed:   EKG:  EKG is not ordered today.     Recent Labs: 10/16/2021: ALT 23; BUN 11; Creatinine, Ser 0.69; Hemoglobin 14.6; Platelets 189.0; Potassium 4.3; Sodium 138; TSH 2.00   Lipid Panel    Component Value Date/Time   CHOL 160 10/16/2021 0936   CHOL 168 03/13/2019 1411   TRIG 57.0 10/16/2021 0936   HDL 83.40 10/16/2021 0936   HDL 89 03/13/2019 1411   CHOLHDL 2 10/16/2021 0936   VLDL 11.4 10/16/2021 0936   LDLCALC 65 10/16/2021 0936   LDLCALC 62 03/13/2019 1411    Additional studies/ records that were reviewed today include:   Cardiac cath 10/08/2021 Prox RCA lesion is 20% stenosed.   Mid Cx lesion is 20% stenosed.   Prox LAD lesion is 30% stenosed.   Mild non-obstructive disease in the proximal LAD, mid Circumflex and mid RCA Normal LV filling pressure Normal LV systolic function   Recommendations:  Continue medical management of mild CAD, likely coronary vasospasm as noted in prior cardiac caths. No medication change. Continue SL NTG.     Cardiac cath September 12, 2011: Left main: No obstructive disease. As noted above, pt with intense vasospasm at beginning of case.  Left Anterior Descending Artery: Ostial spasm however after NTG given, there is minimal obstructive disease in the ostium with mild calcification. The diagonal has ostial 50% stenosis, unchanged from previous cath.  Circumflex Artery: During initial injections, there is a 99% mid stenosis, however, after NTG given this area responded well with minimal 20% stenosis. The proximal portion of the OM 1 also had 30% stenosis.  Right Coronary Artery: Large, dominant vessel with 30% mid stenosis.  Left Ventricular Angiogram: LVEF=55%.    Risk Assessment/Calculations:         ASSESSMENT:    1. Coronary vasospasm (HCC)   2. Dyslipidemia      PLAN:  In order of problems listed above:  CAD nonobstructive on repeat cath 10/08/2021 most likely coronary vasospasm as noted in prior caths.  No medication changes made continue sublingual nitroglycerin. No further chest pain.  Hyperlipidemia LDL 65 10/16/21  Shared Decision Making/Informed Consent        Medication Adjustments/Labs and Tests Ordered: Current medicines are reviewed at length with the patient today.  Concerns regarding medicines are outlined above.  Medication changes, Labs and Tests ordered today are listed in the Patient Instructions below. Patient Instructions  Medication Instructions:  Your physician recommends that you continue on your current medications as directed. Please refer to the Current Medication list given to you today. *If you need a refill on your cardiac medications before your next appointment, please call your pharmacy*   Lab Work: NONE ORDERED   Testing/Procedures: NONE ORDERED   Follow-Up: At [facility], you and your health  needs are our priority.  As part of our continuing mission to provide you with exceptional heart care, we have created designated Provider Care Teams.  These Care Teams include  your primary Cardiologist (physician) and Advanced Practice Providers (APPs -  Physician Assistants and Nurse Practitioners) who all work together to provide you with the care you need, when you need it.  We recommend signing up for the patient portal called "MyChart".  Sign up information is provided on this After Visit Summary.  MyChart is used to connect with patients for Virtual Visits (Telemedicine).  Patients are able to view lab/test results, encounter notes, upcoming appointments, etc.  Non-urgent messages can be sent to your provider as well.   To learn more about what you can do with MyChart, go to [email or web address].    Your next appointment:   1 year(s)  The format for your next appointment:   In Person  Provider:   Lauree Chandler, MD    Other Instructions     Signed, Ermalinda Barrios, PA-C  10/20/2021 12:26 PM    [address], [place], [place]  [address] Phone: [phone]; Fax: [phone]

## 2021-10-16 ENCOUNTER — Ambulatory Visit (INDEPENDENT_AMBULATORY_CARE_PROVIDER_SITE_OTHER): Payer: BC Managed Care – PPO | Admitting: Primary Care

## 2021-10-16 ENCOUNTER — Other Ambulatory Visit: Payer: Self-pay

## 2021-10-16 ENCOUNTER — Encounter: Payer: Self-pay | Admitting: Primary Care

## 2021-10-16 VITALS — BP 120/76 | HR 96 | Temp 96.7°F | Ht 64.0 in | Wt 176.0 lb

## 2021-10-16 DIAGNOSIS — J45909 Unspecified asthma, uncomplicated: Secondary | ICD-10-CM

## 2021-10-16 DIAGNOSIS — E039 Hypothyroidism, unspecified: Secondary | ICD-10-CM | POA: Diagnosis not present

## 2021-10-16 DIAGNOSIS — Z114 Encounter for screening for human immunodeficiency virus [HIV]: Secondary | ICD-10-CM | POA: Diagnosis not present

## 2021-10-16 DIAGNOSIS — Z23 Encounter for immunization: Secondary | ICD-10-CM

## 2021-10-16 DIAGNOSIS — I201 Angina pectoris with documented spasm: Secondary | ICD-10-CM | POA: Diagnosis not present

## 2021-10-16 DIAGNOSIS — R5382 Chronic fatigue, unspecified: Secondary | ICD-10-CM | POA: Diagnosis not present

## 2021-10-16 DIAGNOSIS — I10 Essential (primary) hypertension: Secondary | ICD-10-CM

## 2021-10-16 DIAGNOSIS — E785 Hyperlipidemia, unspecified: Secondary | ICD-10-CM

## 2021-10-16 DIAGNOSIS — Z0001 Encounter for general adult medical examination with abnormal findings: Secondary | ICD-10-CM

## 2021-10-16 DIAGNOSIS — F419 Anxiety disorder, unspecified: Secondary | ICD-10-CM

## 2021-10-16 DIAGNOSIS — F32A Depression, unspecified: Secondary | ICD-10-CM

## 2021-10-16 DIAGNOSIS — R7303 Prediabetes: Secondary | ICD-10-CM

## 2021-10-16 DIAGNOSIS — I2510 Atherosclerotic heart disease of native coronary artery without angina pectoris: Secondary | ICD-10-CM

## 2021-10-16 DIAGNOSIS — J3089 Other allergic rhinitis: Secondary | ICD-10-CM

## 2021-10-16 LAB — COMPREHENSIVE METABOLIC PANEL
ALT: 23 U/L (ref 0–35)
AST: 21 U/L (ref 0–37)
Albumin: 4.8 g/dL (ref 3.5–5.2)
Alkaline Phosphatase: 71 U/L (ref 39–117)
BUN: 11 mg/dL (ref 6–23)
CO2: 32 mEq/L (ref 19–32)
Calcium: 9.2 mg/dL (ref 8.4–10.5)
Chloride: 100 mEq/L (ref 96–112)
Creatinine, Ser: 0.69 mg/dL (ref 0.40–1.20)
GFR: 93.16 mL/min (ref >60.00)
Glucose, Bld: 116 mg/dL — ABNORMAL HIGH (ref 70–99)
Potassium: 4.3 mEq/L (ref 3.5–5.1)
Sodium: 138 mEq/L (ref 135–145)
Total Bilirubin: 0.5 mg/dL (ref 0.2–1.2)
Total Protein: 7.4 g/dL (ref 6.0–8.3)

## 2021-10-16 LAB — CBC
HCT: 44.7 % (ref 36.0–46.0)
Hemoglobin: 14.6 g/dL (ref 12.0–15.0)
MCHC: 32.7 g/dL (ref 30.0–36.0)
MCV: 99.3 fl (ref 78.0–100.0)
Platelets: 189 10*3/uL (ref 150.0–400.0)
RBC: 4.5 Mil/uL (ref 3.87–5.11)
RDW: 14 % (ref 11.5–15.5)
WBC: 6.3 10*3/uL (ref 4.0–10.5)

## 2021-10-16 LAB — LIPID PANEL
Cholesterol: 160 mg/dL (ref 0–200)
HDL: 83.4 mg/dL (ref >39.00)
LDL Cholesterol: 65 mg/dL (ref 0–99)
NonHDL: 76.33
Total CHOL/HDL Ratio: 2
Triglycerides: 57 mg/dL (ref 0.0–149.0)
VLDL: 11.4 mg/dL (ref 0.0–40.0)

## 2021-10-16 LAB — TSH: TSH: 2 u[IU]/mL (ref 0.35–5.50)

## 2021-10-16 LAB — VITAMIN B12: Vitamin B-12: 569 pg/mL (ref 211–911)

## 2021-10-16 LAB — HEMOGLOBIN A1C: Hgb A1c MFr Bld: 5.9 % (ref 4.6–6.5)

## 2021-10-16 LAB — VITAMIN D 25 HYDROXY (VIT D DEFICIENCY, FRACTURES): VITD: 30.58 ng/mL (ref 30.00–100.00)

## 2021-10-16 MED ORDER — SERTRALINE HCL 100 MG PO TABS: 100.0000 mg | ORAL_TABLET | Freq: Every day | ORAL | 0 refills | Status: DC

## 2021-10-16 MED ORDER — LEVOCETIRIZINE DIHYDROCHLORIDE 5 MG PO TABS: 5.0000 mg | ORAL_TABLET | Freq: Every evening | ORAL | 3 refills | Status: DC

## 2021-10-16 NOTE — Assessment & Plan Note (Signed)
Controlled.  Continue isosorbide 30 mg TID, diltiazem 300 mg daily.  BMP from January 2023 reviewed from cardiology

## 2021-10-16 NOTE — Assessment & Plan Note (Signed)
Repeat lipid panel pending.  Continue atorvastatin 20 mg daily. 

## 2021-10-16 NOTE — Assessment & Plan Note (Signed)
Recently symptomatic.  Reviewed left heart catheterization from January 2023.  Continue isosorbide dinitrate 30 mg 3 times daily, diltiazem 300 mg daily, lipid control, blood pressure control.

## 2021-10-16 NOTE — Assessment & Plan Note (Signed)
Triggered by her dogs.   She is using albuterol several times weekly. She historically did well with Xyzal 5 mg daily, will send refills to pharmacy.  Discussed to notify me if she continues to require frequent use of her albuterol inhaler, at that point we will initiate daily maintenance inhaler.  Chest xray from January 2023 reviewed.

## 2021-10-16 NOTE — Assessment & Plan Note (Signed)
She is taking levothyroxine correctly.  Continue levothyroxine 50 mcg daily. Repeat TSH pending. 

## 2021-10-16 NOTE — Assessment & Plan Note (Signed)
Largely secondary to her 2 large [place] dogs.  Prescription for Xyzal 5 mg to take nightly sent to pharmacy. Continue albuterol inhaler as needed.  She will update if no improvement. She has failed Singulair in the past.

## 2021-10-16 NOTE — Assessment & Plan Note (Signed)
Seems to be multifactorial including depression, potential sleep apnea, COPD/asthma.  Will rule out metabolic cause.  We will treat depression by increasing Zoloft to 100 mg daily.  Stop bang score of 4 today which is an intermediate risk of OSA.  I recommended a sleep study for which she currently declines, she will think about this and update if she changes her mind.  Checking labs today including TSH, vitamin B12, vitamin D, CBC, CMP, A1c.  Await results.

## 2021-10-16 NOTE — Patient Instructions (Addendum)
Stop by the lab prior to leaving today. I will notify you of your results once received.   Consider the sleep study.  Follow-up with your GYN for the mammogram and Pap smear.  We increase the dose of your sertraline (Zoloft) to 100 mg.  Take 75 mg (1-1/2 tablet) by mouth daily for 1 week, then increase to 100 mg thereafter.  I sent a prescription to your pharmacy for the 100 mg dose.  Start levocetirizine (Xyzal) 5 mg nightly for allergies.  Please notify me if you continue to use your albuterol inhaler more than 3 times weekly on average.  I will be in touch again soon!  Preventive Care 19-43 Years Old, Female Preventive care refers to lifestyle choices and visits with your health care provider that can promote health and wellness. Preventive care visits are also called wellness exams. What can I expect for my preventive care visit? Counseling Your health care provider may ask you questions about your: Medical history, including: Past medical problems. Family medical history. Pregnancy history. Current health, including: Menstrual cycle. Method of birth control. Emotional well-being. Home life and relationship well-being. Sexual activity and sexual health. Lifestyle, including: Alcohol, nicotine or tobacco, and drug use. Access to firearms. Diet, exercise, and sleep habits. Work and work Statistician. Sunscreen use. Safety issues such as seatbelt and bike helmet use. Physical exam Your health care provider will check your: Height and weight. These may be used to calculate your BMI (body mass index). BMI is a measurement that tells if you are at a healthy weight. Waist circumference. This measures the distance around your waistline. This measurement also tells if you are at a healthy weight and may help predict your risk of certain diseases, such as type 2 diabetes and high blood pressure. Heart rate and blood pressure. Body temperature. Skin for abnormal spots. What  immunizations do I need? Vaccines are usually given at various ages, according to a schedule. Your health care provider will recommend vaccines for you based on your age, medical history, and lifestyle or other factors, such as travel or where you work. What tests do I need? Screening Your health care provider may recommend screening tests for certain conditions. This may include: Lipid and cholesterol levels. Diabetes screening. This is done by checking your blood sugar (glucose) after you have not eaten for a while (fasting). Pelvic exam and Pap test. Hepatitis B test. Hepatitis C test. HIV (human immunodeficiency virus) test. STI (sexually transmitted infection) testing, if you are at risk. Lung cancer screening. Colorectal cancer screening. Mammogram. Talk with your health care provider about when you should start having regular mammograms. This may depend on whether you have a family history of breast cancer. BRCA-related cancer screening. This may be done if you have a family history of breast, ovarian, tubal, or peritoneal cancers. Bone density scan. This is done to screen for osteoporosis. Talk with your health care provider about your test results, treatment options, and if necessary, the need for more tests. Follow these instructions at home: Eating and drinking  Eat a diet that includes fresh fruits and vegetables, whole grains, lean protein, and low-fat dairy products. Take vitamin and mineral supplements as recommended by your health care provider. Do not drink alcohol if: Your health care provider tells you not to drink. You are pregnant, may be pregnant, or are planning to become pregnant. If you drink alcohol: Limit how much you have to 0-1 drink a day. Know how much alcohol is in your drink.  In the [place] one drink equals one 12 oz bottle of beer (355 mL), one 5 oz glass of wine (148 mL), or one 1 oz glass of hard liquor (44 mL). Lifestyle Brush your teeth every morning  and night with fluoride toothpaste. Floss one time each day. Exercise for at least 30 minutes 5 or more days each week. Do not use any products that contain nicotine or tobacco. These products include cigarettes, chewing tobacco, and vaping devices, such as e-cigarettes. If you need help quitting, ask your health care provider. Do not use drugs. If you are sexually active, practice safe sex. Use a condom or other form of protection to prevent STIs. If you do not wish to become pregnant, use a form of birth control. If you plan to become pregnant, see your health care provider for a prepregnancy visit. Take aspirin only as told by your health care provider. Make sure that you understand how much to take and what form to take. Work with your health care provider to find out whether it is safe and beneficial for you to take aspirin daily. Find healthy ways to manage stress, such as: Meditation, yoga, or listening to music. Journaling. Talking to a trusted person. Spending time with friends and family. Minimize exposure to UV radiation to reduce your risk of skin cancer. Safety Always wear your seat belt while driving or riding in a vehicle. Do not drive: If you have been drinking alcohol. Do not ride with someone who has been drinking. When you are tired or distracted. While texting. If you have been using any mind-altering substances or drugs. Wear a helmet and other protective equipment during sports activities. If you have firearms in your house, make sure you follow all gun safety procedures. Seek help if you have been physically or sexually abused. What's next? Visit your health care provider once a year for an annual wellness visit. Ask your health care provider how often you should have your eyes and teeth checked. Stay up to date on all vaccines. This information is not intended to replace advice given to you by your health care provider. Make sure you discuss any questions you have  with your health care provider. Document Revised: 02/11/2021 Document Reviewed: 02/11/2021 Elsevier Patient Education  [employer].

## 2021-10-16 NOTE — Assessment & Plan Note (Addendum)
Shingrix and tetanus due today, provided.  Other vaccines up-to-date. Pap smear mammogram due, follows with GYN and will have this done this year. Colonoscopy up-to-date per patient, due in 2026.  Discussed lung cancer screening, she will think about this.  Discussed the importance of a healthy diet and regular exercise in order for weight loss, and to reduce the risk of further co-morbidity.  Exam today as noted. Labs pending and also reviewed from cardiology

## 2021-10-16 NOTE — Assessment & Plan Note (Signed)
Discussed the importance of a healthy diet and regular exercise in order for weight loss, and to reduce the risk of further co-morbidity.  Repeat A1c pending. 

## 2021-10-16 NOTE — Assessment & Plan Note (Signed)
Deteriorated.  Fortunately Zoloft 50 mg was effective previously.  Will increase dose of Zoloft to 75 mg x 1 week, then to 100 mg thereafter.  Prescription for Zoloft 100 mg sent to pharmacy.

## 2021-10-16 NOTE — Progress Notes (Signed)
Subjective:    Patient ID: Caroline [name], female    DOB: [birth year], 61 y.o.   MRN: [id number]  HPI  Caroline [name] is a very pleasant 61 y.o. female who presents today for complete physical and follow up of chronic conditions. She would also like to discuss several other things.  She would also like to discuss chronic fatigue. She's questioning is she has a thyroid disorder. She sleeps "okay", wakes up frequently during the night for no reason. Feels tired all day and feels like she could fall asleep at any time during the day. She snores at night, sleeps alone.  She has vivid, crazy dreams. She does have a history of asthma. She has never undergone a sleep study.  Chronic asthma, has two large [employer]. Symptoms include itching, post nasal drip, chest tightness. She uses Flonase helps. She was once managed on Xyzal which was effective. Prior history of tobacco abuse, has smoked and quit numerous times within the last 30 years. '  Managed on sertraline 50 mg for depression, this helped initially but she feels like "my body has become immune". She doesn't feel well. Symptoms include feeling down, little motivation to do things, worrying, anxiety. She is questioning if her does needs to be increased.   Immunizations: -Tetanus: Completed in 2012 -Influenza: Completed the season -Covid-19: 3 vaccine -Shingles: Never completed  Diet: Fair diet.  Exercise: No regular exercise.  Eye exam: Completes annually  Dental exam: Completes semi-annually   Pap Smear: Completed in 2019, follows with GYN Mammogram: Completed in December 2021, follows with GYN Colonoscopy: Completed in 2016, due 2026.  BP Readings from Last 3 Encounters:  10/16/21 120/76  10/08/21 122/72  09/29/21 122/60      Review of Systems  Constitutional:  Positive for fatigue. Negative for unexpected weight change.  HENT:  Positive for postnasal drip.   Eyes:  Positive for visual disturbance.        Cataracts bilaterally  Respiratory:  Negative for cough and shortness of breath.   Cardiovascular:  Negative for chest pain.  Gastrointestinal:  Negative for constipation and diarrhea.  Genitourinary:  Negative for difficulty urinating.  Musculoskeletal:  Negative for arthralgias and myalgias.  Skin:  Negative for rash.  Allergic/Immunologic: Positive for environmental allergies.  Neurological:  Negative for dizziness and headaches.  Psychiatric/Behavioral:  Positive for sleep disturbance. The patient is nervous/anxious.        See HPI        Past Medical History:  Diagnosis Date   CAD (coronary artery disease)    has documented coronary vasospasm but with underlying 40% prox LAD, 30% prox LCX and 70 to 80% proximal 1st DX and 30% RCA; recurrent vasospasm with subsequent cath in Jan 2013.    Coronary vasospasm (HCC)    Environmental and seasonal allergies    Esophagitis    GERD (gastroesophageal reflux disease)    Hyperlipidemia    Hypertension    Hypothyroidism    IBS (irritable bowel syndrome)    Palpitations    tachycardia on holter; treated with beta blockers   Right ovarian cyst     Social History   Socioeconomic History   Marital status: Married    Spouse name: Not on file   Number of children: 2   Years of education: Not on file   Highest education level: Not on file  Occupational History   Occupation: Unemployed  Tobacco Use   Smoking status: Former    Packs/day: 0.25  Years: 30.00    Pack years: 7.50    Types: Cigarettes    Quit date: 05/30/2014    Years since quitting: 7.3   Smokeless tobacco: Never  Vaping Use   Vaping Use: Never used  Substance and Sexual Activity   Alcohol use: Yes    Alcohol/week: 2.0 standard drinks    Types: 2 Standard drinks or equivalent per week    Comment: occ   Drug use: No   Sexual activity: Yes  Other Topics Concern   Not on file  Social History Narrative   Married.   2 children.   Retired, once worked in  Pharmacologist.   She enjoys being with her dogs, shopping.    Social Determinants of Health   Financial Resource Strain: Not on file  Food Insecurity: Not on file  Transportation Needs: Not on file  Physical Activity: Not on file  Stress: Not on file  Social Connections: Not on file  Intimate Partner Violence: Not on file    Past Surgical History:  Procedure Laterality Date   APPENDECTOMY  1976   BREAST EXCISIONAL BIOPSY Right    benign   BREAST SURGERY Right 2002   biopsy   CARDIAC CATHETERIZATION  2011   CARDIAC CATHETERIZATION  Sept 2012   With documented vasospasm but with residual disease; managed medically   CHOLECYSTECTOMY  1982   open   LAPAROSCOPIC SALPINGO OOPHERECTOMY Right 09/18/2020   Procedure: LAPAROSCOPIC SALPINGO OOPHORECTOMY;  Surgeon: Aloha Gell, MD;  Location: [facility];  Service: Gynecology;  Laterality: Right;   LEFT HEART CATH AND CORONARY ANGIOGRAPHY N/A 10/08/2021   Procedure: LEFT HEART CATH AND CORONARY ANGIOGRAPHY;  Surgeon: Burnell Blanks, MD;  Location: [place] [facility];  Service: Cardiovascular;  Laterality: N/A;   LEFT HEART CATHETERIZATION WITH CORONARY ANGIOGRAM N/A 09/14/2011   Procedure: LEFT HEART CATHETERIZATION WITH CORONARY ANGIOGRAM;  Surgeon: Burnell Blanks, MD;  Location: [facility] C[facility];  Service: Cardiovascular;  Laterality: N/A;   UPPER GASTROINTESTINAL ENDOSCOPY  01/11/2011   esophagitis    Family History  Problem Relation Age of Onset   Breast cancer Mother 10   Heart disease Mother        died CHF 39   Diabetes Mother    Prostate cancer Father    Hyperlipidemia Father    Heart disease Father        s/p cabg - alive   Colon cancer Other        First cousin on dads side    Thyroid disease Maternal Grandmother     No Known Allergies  Current Outpatient Medications on File Prior to Visit  Medication Sig Dispense Refill   acetaminophen (TYLENOL) 500 MG tablet Take 500 mg by mouth every 6  (six) hours as needed. For pain     albuterol (VENTOLIN HFA) 108 (90 Base) MCG/ACT inhaler INHALE 2 PUFFS BY MOUTH EVERY 6 HOURS AS NEEDED 8.5 each 0   aspirin 81 MG tablet Take 81 mg by mouth daily.     atorvastatin (LIPITOR) 20 MG tablet TAKE 1 TABLET BY MOUTH EVERY DAY 90 tablet 2   Cholecalciferol (VITAMIN D) 2000 units CAPS Take 2000 units once per day. 30 capsule    Cyanocobalamin (VITAMIN B-12 PO) Take by mouth.     diclofenac Sodium (VOLTAREN) 1 % GEL Apply 2-4 g topically 4 (four) times daily. (Patient taking differently: Apply 2-4 g topically daily as needed (pain).) 200 g 2   diltiazem (TIAZAC) 300 MG  24 hr capsule TAKE 1 CAPSULE BY MOUTH EVERY DAY 90 capsule 3   fluticasone (FLONASE) 50 MCG/ACT nasal spray Place 2 sprays into both nostrils daily. (Patient taking differently: Place 2 sprays into both nostrils daily as needed for allergies.) 16 g 6   ibuprofen (ADVIL) 800 MG tablet Take 1 tablet (800 mg total) by mouth every 8 (eight) hours as needed. 60 tablet 1   isosorbide dinitrate (ISORDIL) 30 MG tablet TAKE 1 TABLET BY MOUTH THREE TIMES A DAY 270 tablet 2   levothyroxine (SYNTHROID) 50 MCG tablet Take 1 tablet by mouth every morning on an empty stomach with water only.  No food or other medications for 30 minutes. Office visit required for further refills. 30 tablet 0   nitroGLYCERIN (NITROSTAT) 0.4 MG SL tablet PLACE 1 TABLET (0.4 MG TOTAL) UNDER THE TONGUE EVERY 5 (FIVE) MINUTES AS NEEDED FOR CHEST PAIN. X 3 DOSES 25 tablet 3   Polyethyl Glycol-Propyl Glycol (SYSTANE) 0.4-0.3 % SOLN Place 1 drop into both eyes in the morning, at noon, in the evening, and at bedtime.     No current facility-administered medications on file prior to visit.    BP 120/76    Pulse 96    Temp (!) 96.7 F (35.9 C) (Temporal)    Ht 5\' 4"  (1.626 m)    Wt 176 lb (79.8 kg)    LMP 07/28/2010    SpO2 (!) 89%    BMI 30.21 kg/m  Objective:   Physical Exam HENT:     Right Ear: Tympanic membrane and ear  canal normal.     Left Ear: Tympanic membrane and ear canal normal.     Nose: Nose normal.  Eyes:     Conjunctiva/sclera: Conjunctivae normal.     Pupils: Pupils are equal, round, and reactive to light.  Neck:     Thyroid: No thyromegaly.  Cardiovascular:     Rate and Rhythm: Normal rate and regular rhythm.     Heart sounds: No murmur heard. Pulmonary:     Effort: Pulmonary effort is normal.     Breath sounds: Normal breath sounds. No rales.  Abdominal:     General: Bowel sounds are normal.     Palpations: Abdomen is soft.     Tenderness: There is no abdominal tenderness.  Musculoskeletal:        General: Normal range of motion.     Cervical back: Neck supple.  Lymphadenopathy:     Cervical: No cervical adenopathy.  Skin:    General: Skin is warm and dry.     Findings: No rash.  Neurological:     Mental Status: She is alert and oriented to person, place, and time.     Cranial Nerves: No cranial nerve deficit.     Deep Tendon Reflexes: Reflexes are normal and symmetric.  Psychiatric:        Mood and Affect: Mood normal.          Assessment & Plan:   30 minutes spent face to face with patient, >50% spent counseling or coordinating care, outside of CPE time.    This visit occurred during the SARS-CoV-2 public health emergency.  Safety protocols were in place, including screening questions prior to the visit, additional usage of staff PPE, and extensive cleaning of exam room while observing appropriate contact time as indicated for disinfecting solutions.

## 2021-10-16 NOTE — Addendum Note (Signed)
Addended by: Francella Solian on: 10/16/2021 10:02 AM   Modules accepted: Orders

## 2021-10-16 NOTE — Assessment & Plan Note (Signed)
Overall controlled per patient.  Recent left cardiac catheterization reviewed from Epic. Continue isosorbide dinitrate 30 mg TID.

## 2021-10-19 DIAGNOSIS — E039 Hypothyroidism, unspecified: Secondary | ICD-10-CM

## 2021-10-19 LAB — HIV ANTIBODY (ROUTINE TESTING W REFLEX): HIV 1&2 Ab, 4th Generation: NONREACTIVE

## 2021-10-19 MED ORDER — LEVOTHYROXINE SODIUM 50 MCG PO TABS: ORAL_TABLET | ORAL | 3 refills | Status: DC

## 2021-10-20 ENCOUNTER — Ambulatory Visit: Payer: BC Managed Care – PPO | Admitting: Physician Assistant

## 2021-10-20 ENCOUNTER — Encounter: Payer: Self-pay | Admitting: Physician Assistant

## 2021-10-20 ENCOUNTER — Other Ambulatory Visit: Payer: Self-pay

## 2021-10-20 VITALS — BP 138/68 | HR 92 | Ht 64.0 in | Wt 171.0 lb

## 2021-10-20 DIAGNOSIS — I201 Angina pectoris with documented spasm: Secondary | ICD-10-CM | POA: Diagnosis not present

## 2021-10-20 DIAGNOSIS — E785 Hyperlipidemia, unspecified: Secondary | ICD-10-CM | POA: Diagnosis not present

## 2021-10-20 NOTE — Patient Instructions (Signed)
Medication Instructions:  Your physician recommends that you continue on your current medications as directed. Please refer to the Current Medication list given to you today. *If you need a refill on your cardiac medications before your next appointment, please call your pharmacy*   Lab Work: NONE ORDERED   Testing/Procedures: NONE ORDERED   Follow-Up: At [facility], you and your health needs are our priority.  As part of our continuing mission to provide you with exceptional heart care, we have created designated Provider Care Teams.  These Care Teams include your primary Cardiologist (physician) and Advanced Practice Providers (APPs -  Physician Assistants and Nurse Practitioners) who all work together to provide you with the care you need, when you need it.  We recommend signing up for the patient portal called "MyChart".  Sign up information is provided on this After Visit Summary.  MyChart is used to connect with patients for Virtual Visits (Telemedicine).  Patients are able to view lab/test results, encounter notes, upcoming appointments, etc.  Non-urgent messages can be sent to your provider as well.   To learn more about what you can do with MyChart, go to [email or web address].    Your next appointment:   1 year(s)  The format for your next appointment:   In Person  Provider:   Lauree Chandler, MD    Other Instructions

## 2021-10-23 DIAGNOSIS — H2512 Age-related nuclear cataract, left eye: Secondary | ICD-10-CM | POA: Diagnosis not present

## 2021-10-26 ENCOUNTER — Ambulatory Visit: Payer: BC Managed Care – PPO | Admitting: Orthopaedic Surgery

## 2021-11-02 ENCOUNTER — Encounter: Payer: Self-pay | Admitting: Primary Care

## 2021-12-08 DIAGNOSIS — D2261 Melanocytic nevi of right upper limb, including shoulder: Secondary | ICD-10-CM | POA: Diagnosis not present

## 2021-12-08 DIAGNOSIS — L309 Dermatitis, unspecified: Secondary | ICD-10-CM | POA: Diagnosis not present

## 2021-12-08 DIAGNOSIS — Z85828 Personal history of other malignant neoplasm of skin: Secondary | ICD-10-CM | POA: Diagnosis not present

## 2021-12-08 DIAGNOSIS — D2272 Melanocytic nevi of left lower limb, including hip: Secondary | ICD-10-CM | POA: Diagnosis not present

## 2021-12-18 DIAGNOSIS — H2511 Age-related nuclear cataract, right eye: Secondary | ICD-10-CM | POA: Diagnosis not present

## 2021-12-21 DIAGNOSIS — Z6830 Body mass index (BMI) 30.0-30.9, adult: Secondary | ICD-10-CM | POA: Diagnosis not present

## 2021-12-21 DIAGNOSIS — Z01419 Encounter for gynecological examination (general) (routine) without abnormal findings: Secondary | ICD-10-CM | POA: Diagnosis not present

## 2021-12-21 DIAGNOSIS — Z1231 Encounter for screening mammogram for malignant neoplasm of breast: Secondary | ICD-10-CM | POA: Diagnosis not present

## 2021-12-21 LAB — HM MAMMOGRAPHY

## 2021-12-22 ENCOUNTER — Other Ambulatory Visit: Payer: Self-pay | Admitting: Obstetrics

## 2021-12-22 DIAGNOSIS — Z9189 Other specified personal risk factors, not elsewhere classified: Secondary | ICD-10-CM

## 2021-12-24 ENCOUNTER — Ambulatory Visit
Admission: RE | Admit: 2021-12-24 | Discharge: 2021-12-24 | Disposition: A | Discharge status: Home / Self Care | Payer: BC Managed Care – PPO | Source: Ambulatory Visit | Attending: Obstetrics | Admitting: Obstetrics

## 2021-12-24 DIAGNOSIS — Z9189 Other specified personal risk factors, not elsewhere classified: Secondary | ICD-10-CM

## 2021-12-24 DIAGNOSIS — N644 Mastodynia: Secondary | ICD-10-CM | POA: Diagnosis not present

## 2021-12-24 MED ORDER — GADOBUTROL 1 MMOL/ML IV SOLN
8.0000 mL | Freq: Once | INTRAVENOUS | Status: AC | PRN
  Administered 2021-12-24: 8 mL via INTRAVENOUS

## 2021-12-29 ENCOUNTER — Ambulatory Visit: Payer: BC Managed Care – PPO | Admitting: Primary Care

## 2021-12-29 ENCOUNTER — Encounter: Payer: Self-pay | Admitting: Primary Care

## 2021-12-29 VITALS — BP 118/68 | HR 81 | Ht 63.5 in | Wt 173.4 lb

## 2021-12-29 DIAGNOSIS — F419 Anxiety disorder, unspecified: Secondary | ICD-10-CM | POA: Diagnosis not present

## 2021-12-29 DIAGNOSIS — Z23 Encounter for immunization: Secondary | ICD-10-CM

## 2021-12-29 DIAGNOSIS — F32A Depression, unspecified: Secondary | ICD-10-CM | POA: Diagnosis not present

## 2021-12-29 MED ORDER — SERTRALINE HCL 100 MG PO TABS: 100.0000 mg | ORAL_TABLET | Freq: Every day | ORAL | 2 refills | Status: DC

## 2021-12-29 NOTE — Patient Instructions (Signed)
Continue sertraline (Zoloft) 100 mg daily. ? ?It was a pleasure to see you today! ? ? ?

## 2021-12-29 NOTE — Assessment & Plan Note (Signed)
Improved! ? ?Continue sertraline 100 mg daily. ?Refills sent to pharmacy.  ?

## 2021-12-29 NOTE — Progress Notes (Signed)
? ?Subjective:  ? ? Patient ID: Caroline [name], female    DOB: 11-09-1960, 61 y.o.   MRN: [id number] ? ?HPI ? ?Caroline [name] is a very pleasant 61 y.o. female with a history of hypothyroidism, CAD, anxiety and depression, prediabetes, chronic fatigue who presents today for follow-up of anxiety and depression.  She is also due for her second Shingrix vaccine ? ?She was last evaluated on 10/16/2021 for her routine physical when she mentioned numerous symptoms including chronic fatigue, ongoing anxiety and depression, worrying, little motivation to do things.  She was compliant to her sertraline 50 mg daily, but had been on this for numerous years and felt as though it was no longer effective.  Given that she had been so successful on Zoloft previously we increased her dose of Zoloft to 100 mg. ? ?Since her last visit she's doing much better! Positive effects include increased energy, increased motivation to do things, feeling less anxious, is sleeping much better. She denies any negative effects when jumping from 50 mg to 100 mg.  ? ? ?Review of Systems  ?Constitutional:  Negative for fatigue.  ?Gastrointestinal:  Negative for abdominal pain.  ?Neurological:  Negative for headaches.  ?Psychiatric/Behavioral:  Negative for sleep disturbance and suicidal ideas. The patient is not nervous/anxious.   ?     See HPI  ? ?   ? ? ?Past Medical History:  ?Diagnosis Date  ? CAD (coronary artery disease)   ? has documented coronary vasospasm but with underlying 40% prox LAD, 30% prox LCX and 70 to 80% proximal 1st DX and 30% RCA; recurrent vasospasm with subsequent cath in Jan 2013.   ? Coronary vasospasm (HCC)   ? Environmental and seasonal allergies   ? Esophagitis   ? GERD (gastroesophageal reflux disease)   ? Hyperlipidemia   ? Hypertension   ? Hypothyroidism   ? IBS (irritable bowel syndrome)   ? Palpitations   ? tachycardia on holter; treated with beta blockers  ? Right ovarian cyst   ? ? ?Social History  ? ?Socioeconomic  History  ? Marital status: Married  ?  Spouse name: Not on file  ? Number of children: 2  ? Years of education: Not on file  ? Highest education level: Not on file  ?Occupational History  ? Occupation: Unemployed  ?Tobacco Use  ? Smoking status: Former  ?  Packs/day: 0.25  ?  Years: 30.00  ?  Pack years: 7.50  ?  Types: Cigarettes  ?  Quit date: 05/30/2014  ?  Years since quitting: 7.5  ? Smokeless tobacco: Never  ?Vaping Use  ? Vaping Use: Never used  ?Substance and Sexual Activity  ? Alcohol use: Yes  ?  Alcohol/week: 2.0 standard drinks  ?  Types: 2 Standard drinks or equivalent per week  ?  Comment: occ  ? Drug use: No  ? Sexual activity: Yes  ?Other Topics Concern  ? Not on file  ?Social History Narrative  ? Married.  ? 2 children.  ? 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  ? She enjoys being with her dogs, shopping.   ? ?Social Determinants of Health  ? ?Financial Resource Strain: Not on file  ?Food Insecurity: Not on file  ?Transportation Needs: Not on file  ?Physical Activity: Not on file  ?Stress: Not on file  ?Social Connections: Not on file  ?Intimate Partner Violence: Not on file  ? ? ?Past Surgical History:  ?Procedure Laterality Date  ? APPENDECTOMY  1976  ? BREAST  EXCISIONAL BIOPSY Right   ? benign  ? BREAST SURGERY Right 2002  ? biopsy  ? CARDIAC CATHETERIZATION  2011  ? CARDIAC CATHETERIZATION  05/2011  ? With documented vasospasm but with residual disease; managed medically  ? CHOLECYSTECTOMY  1982  ? open  ? INTRACAPSULAR CATARACT EXTRACTION Right   ? LAPAROSCOPIC SALPINGO OOPHERECTOMY Right 09/18/2020  ? Procedure: LAPAROSCOPIC SALPINGO OOPHORECTOMY;  Surgeon: Aloha Gell, MD;  Location: [facility];  Service: Gynecology;  Laterality: Right;  ? LEFT HEART CATH AND CORONARY ANGIOGRAPHY N/A 10/08/2021  ? Procedure: LEFT HEART CATH AND CORONARY ANGIOGRAPHY;  Surgeon: Burnell Blanks, MD;  Location: [place] [facility];  Service: Cardiovascular;  Laterality: N/A;  ? LEFT HEART  CATHETERIZATION WITH CORONARY ANGIOGRAM N/A 09/14/2011  ? Procedure: LEFT HEART CATHETERIZATION WITH CORONARY ANGIOGRAM;  Surgeon: Burnell Blanks, MD;  Location: [facility];  Service: Cardiovascular;  Laterality: N/A;  ? UPPER GASTROINTESTINAL ENDOSCOPY  01/11/2011  ? esophagitis  ? ? ?Family History  ?Problem Relation Age of Onset  ? Breast cancer Mother 34  ? Heart disease Mother   ?     died CHF 39  ? Diabetes Mother   ? Prostate cancer Father   ? Hyperlipidemia Father   ? Heart disease Father   ?     s/p cabg - alive  ? Colon cancer Other   ?     First cousin on dads side   ? Thyroid disease Maternal Grandmother   ? ? ?No Known Allergies ? ?Current Outpatient Medications on File Prior to Visit  ?Medication Sig Dispense Refill  ? acetaminophen (TYLENOL) 500 MG tablet Take 500 mg by mouth every 6 (six) hours as needed. For pain    ? albuterol (VENTOLIN HFA) 108 (90 Base) MCG/ACT inhaler INHALE 2 PUFFS BY MOUTH EVERY 6 HOURS AS NEEDED 8.5 each 0  ? aspirin 81 MG tablet Take 81 mg by mouth daily.    ? atorvastatin (LIPITOR) 20 MG tablet TAKE 1 TABLET BY MOUTH EVERY DAY 90 tablet 2  ? Cholecalciferol (VITAMIN D) 2000 units CAPS Take 2000 units once per day. 30 capsule   ? Cyanocobalamin (VITAMIN B-12 PO) Take by mouth.    ? diclofenac Sodium (VOLTAREN) 1 % GEL Apply 2-4 g topically 4 (four) times daily. (Patient taking differently: Apply 2-4 g topically daily as needed (pain).) 200 g 2  ? diltiazem (TIAZAC) 300 MG 24 hr capsule TAKE 1 CAPSULE BY MOUTH EVERY DAY 90 capsule 3  ? fluticasone (FLONASE) 50 MCG/ACT nasal spray Place 2 sprays into both nostrils daily. (Patient taking differently: Place 2 sprays into both nostrils daily as needed for allergies.) 16 g 6  ? isosorbide dinitrate (ISORDIL) 30 MG tablet TAKE 1 TABLET BY MOUTH THREE TIMES A DAY 270 tablet 2  ? levocetirizine (XYZAL) 5 MG tablet Take 1 tablet (5 mg total) by mouth every evening. For allergies 90 tablet 3  ? levothyroxine (SYNTHROID) 50 MCG  tablet Take 1 tablet by mouth every morning on an empty stomach with water only.  No food or other medications for 30 minutes. 90 tablet 3  ? nitroGLYCERIN (NITROSTAT) 0.4 MG SL tablet PLACE 1 TABLET (0.4 MG TOTAL) UNDER THE TONGUE EVERY 5 (FIVE) MINUTES AS NEEDED FOR CHEST PAIN. X 3 DOSES 25 tablet 3  ? Polyethyl Glycol-Propyl Glycol (SYSTANE) 0.4-0.3 % SOLN Place 1 drop into both eyes in the morning, at noon, in the evening, and at bedtime.    ?  sertraline (ZOLOFT) 100 MG tablet Take 1 tablet (100 mg total) by mouth daily. For anxiety and depression 90 tablet 0  ? ?No current facility-administered medications on file prior to visit.  ? ? ?BP 118/68   Pulse 81   Ht 5' 3.5" (1.613 m)   Wt 173 lb 6.4 oz (78.7 kg)   LMP 07/28/2010   SpO2 94%   BMI 30.23 kg/m?  ?Objective:  ? Physical Exam ?Cardiovascular:  ?   Rate and Rhythm: Normal rate and regular rhythm.  ?Pulmonary:  ?   Effort: Pulmonary effort is normal.  ?   Breath sounds: Normal breath sounds.  ?Musculoskeletal:  ?   Cervical back: Neck supple.  ?Skin: ?   General: Skin is warm and dry.  ?Psychiatric:     ?   Mood and Affect: Mood normal.  ? ? ? ? ? ?   ?Assessment & Plan:  ? ? ? ? ?This visit occurred during the SARS-CoV-2 public health emergency.  Safety protocols were in place, including screening questions prior to the visit, additional usage of staff PPE, and extensive cleaning of exam room while observing appropriate contact time as indicated for disinfecting solutions.  ?

## 2022-02-10 ENCOUNTER — Ambulatory Visit: Payer: BC Managed Care – PPO | Admitting: Orthopaedic Surgery

## 2022-02-10 DIAGNOSIS — M259 Joint disorder, unspecified: Secondary | ICD-10-CM

## 2022-02-10 MED ORDER — MELOXICAM 15 MG PO TABS: 15.0000 mg | ORAL_TABLET | Freq: Every day | ORAL | 3 refills | Status: DC

## 2022-02-10 NOTE — Progress Notes (Signed)
The patient is well-known to [place].  We have performed carpal tunnel surgery on the right side in the past.  She is 61 years old and comes in with a chief complaint of multiple joint pains with stiffness and aching with no known injury.  She has never been diagnosed with any type of rheumatologic condition.  She does not take anti-inflammatories because she was concerned about being on a baby aspirin daily and the effects of taking anti-inflammatories as well.  She has had meloxicam in the past but is not on anything now.  I gave her reassurance that the meloxicam would be safer to try given her multiple joint complaints.  She complains of bilateral shoulder pain, bilateral elbow pain, bilateral hand and finger pain as well as bilateral knee pain and bilateral ankle pain.  On my exam there is no gross abnormalities of any of the joints of her upper or lower extremities.  There is no effusion that I can see at her knees and her fingers are not swollen.  All of these are painful to her but have excellent range of motion.  I encouraged her to try meloxicam on a daily basis.  She is also complaining of restless legs throughout the day.  Given the multiple joint complaints, I would like to send her to a rheumatologist for further evaluation and treatment.  She agrees with this treatment plan.

## 2022-02-12 ENCOUNTER — Other Ambulatory Visit: Payer: Self-pay

## 2022-02-12 DIAGNOSIS — M259 Joint disorder, unspecified: Secondary | ICD-10-CM

## 2022-02-19 ENCOUNTER — Other Ambulatory Visit: Payer: Self-pay | Admitting: Primary Care

## 2022-02-19 DIAGNOSIS — J45909 Unspecified asthma, uncomplicated: Secondary | ICD-10-CM

## 2022-02-19 MED ORDER — ALBUTEROL SULFATE HFA 108 (90 BASE) MCG/ACT IN AERS: 2.0000 | INHALATION_SPRAY | Freq: Four times a day (QID) | RESPIRATORY_TRACT | 0 refills | Status: DC | PRN

## 2022-02-24 ENCOUNTER — Encounter: Payer: Self-pay | Admitting: Primary Care

## 2022-03-02 ENCOUNTER — Other Ambulatory Visit: Payer: Self-pay | Admitting: Cardiovascular Disease

## 2022-03-03 DIAGNOSIS — M791 Myalgia, unspecified site: Secondary | ICD-10-CM | POA: Diagnosis not present

## 2022-03-03 DIAGNOSIS — M79672 Pain in left foot: Secondary | ICD-10-CM | POA: Diagnosis not present

## 2022-03-03 DIAGNOSIS — M79671 Pain in right foot: Secondary | ICD-10-CM | POA: Diagnosis not present

## 2022-03-03 DIAGNOSIS — M25571 Pain in right ankle and joints of right foot: Secondary | ICD-10-CM | POA: Diagnosis not present

## 2022-03-03 DIAGNOSIS — M25562 Pain in left knee: Secondary | ICD-10-CM | POA: Diagnosis not present

## 2022-03-03 DIAGNOSIS — M79641 Pain in right hand: Secondary | ICD-10-CM | POA: Diagnosis not present

## 2022-03-03 DIAGNOSIS — R5383 Other fatigue: Secondary | ICD-10-CM | POA: Diagnosis not present

## 2022-03-03 DIAGNOSIS — M25572 Pain in left ankle and joints of left foot: Secondary | ICD-10-CM | POA: Diagnosis not present

## 2022-03-03 DIAGNOSIS — M25561 Pain in right knee: Secondary | ICD-10-CM | POA: Diagnosis not present

## 2022-03-03 DIAGNOSIS — M1990 Unspecified osteoarthritis, unspecified site: Secondary | ICD-10-CM | POA: Diagnosis not present

## 2022-03-03 DIAGNOSIS — M25551 Pain in right hip: Secondary | ICD-10-CM | POA: Diagnosis not present

## 2022-03-03 DIAGNOSIS — M2550 Pain in unspecified joint: Secondary | ICD-10-CM | POA: Diagnosis not present

## 2022-03-03 DIAGNOSIS — M79642 Pain in left hand: Secondary | ICD-10-CM | POA: Diagnosis not present

## 2022-03-03 MED ORDER — DILTIAZEM HCL ER BEADS 300 MG PO CP24: ORAL_CAPSULE | ORAL | 2 refills | Status: DC

## 2022-04-20 DIAGNOSIS — R5383 Other fatigue: Secondary | ICD-10-CM | POA: Diagnosis not present

## 2022-04-20 DIAGNOSIS — M797 Fibromyalgia: Secondary | ICD-10-CM | POA: Diagnosis not present

## 2022-04-20 DIAGNOSIS — Z13820 Encounter for screening for osteoporosis: Secondary | ICD-10-CM | POA: Diagnosis not present

## 2022-04-20 DIAGNOSIS — M8580 Other specified disorders of bone density and structure, unspecified site: Secondary | ICD-10-CM | POA: Diagnosis not present

## 2022-04-20 DIAGNOSIS — M1990 Unspecified osteoarthritis, unspecified site: Secondary | ICD-10-CM | POA: Diagnosis not present

## 2022-04-21 ENCOUNTER — Other Ambulatory Visit: Payer: Self-pay | Admitting: Cardiovascular Disease

## 2022-05-04 ENCOUNTER — Other Ambulatory Visit: Payer: Self-pay | Admitting: Primary Care

## 2022-05-04 DIAGNOSIS — J45909 Unspecified asthma, uncomplicated: Secondary | ICD-10-CM

## 2022-05-04 NOTE — Telephone Encounter (Signed)
Received refill request for albuterol inhaler from a pharmacy in [place]. Not sure if she needed a refill? If so was it for CT? I went ahead and sent to CT.

## 2022-05-06 NOTE — Telephone Encounter (Signed)
Called patient she was on vacation and had sent into pharmacy out of town. She did need refill but at local pharmacy they are going to transfer script back to local. If any problems getting filled will let our office know.

## 2022-05-26 ENCOUNTER — Ambulatory Visit: Payer: BC Managed Care – PPO | Admitting: Primary Care

## 2022-05-26 ENCOUNTER — Encounter: Payer: Self-pay | Admitting: Primary Care

## 2022-05-26 VITALS — BP 130/68 | HR 97 | Temp 98.3°F | Ht 63.5 in | Wt 171.0 lb

## 2022-05-26 DIAGNOSIS — F32A Anxiety disorder, unspecified: Secondary | ICD-10-CM

## 2022-05-26 DIAGNOSIS — E039 Hypothyroidism, unspecified: Secondary | ICD-10-CM

## 2022-05-26 DIAGNOSIS — M797 Fibromyalgia: Secondary | ICD-10-CM | POA: Insufficient documentation

## 2022-05-26 DIAGNOSIS — Z23 Encounter for immunization: Secondary | ICD-10-CM

## 2022-05-26 DIAGNOSIS — R5382 Chronic fatigue, unspecified: Secondary | ICD-10-CM

## 2022-05-26 DIAGNOSIS — F419 Anxiety disorder, unspecified: Secondary | ICD-10-CM | POA: Diagnosis not present

## 2022-05-26 DIAGNOSIS — R0683 Snoring: Secondary | ICD-10-CM | POA: Diagnosis not present

## 2022-05-26 DIAGNOSIS — G4733 Obstructive sleep apnea (adult) (pediatric): Secondary | ICD-10-CM | POA: Insufficient documentation

## 2022-05-26 MED ORDER — DULOXETINE HCL 30 MG PO CPEP: 30.0000 mg | ORAL_CAPSULE | Freq: Every day | ORAL | 0 refills | Status: DC

## 2022-05-26 NOTE — Patient Instructions (Signed)
Reduce your Zoloft to 1/2 tablet daily x 1 week, then stop.  Start duloxetine (Cymbalta) 30 mg once daily now for anxiety/depression/fibromyalgia.  You will be contacted regarding your referral to pulmonology for the sleep study.  Please let us know if you have not been contacted within two weeks.   Schedule a follow up visit in 1 month.  It was a pleasure to see you today!

## 2022-05-26 NOTE — Assessment & Plan Note (Signed)
TSH from February 2023 controlled She is taking levothyroxine correctly Continue levothyroxine 50 mcg daily

## 2022-05-26 NOTE — Assessment & Plan Note (Signed)
Continued.   Highly suspicious for sleep apnea as prior work up has been negative.  She agrees to proceed with sleep study, referral placed to pulmonology.   Working to treat anxiety/depression by switching to Cymbalta 30 mg daily.

## 2022-05-26 NOTE — Assessment & Plan Note (Signed)
New diagnosis per rheumatology.  Encouraged daily physical activity. Continue Meloxicam 15 mg PRN.  Start Cymbalta 30 mg daily. Follow up in 1 month

## 2022-05-26 NOTE — Progress Notes (Signed)
Subjective:    Patient ID: Caroline [name], female    DOB: 11/05/1960, 61 y.o.   MRN: [id number]  HPI  Caroline [name] is a very pleasant 61 y.o. female with a history of CAD, hypertension, hypothyroidism, IBS, multiple renal cysts, prediabetes, carpal tunnel with history of right sided surgery who presents today to discuss several updates and concerns.  1) Hypothyroidism: Currently managed on levothyroxine 50 mcg tablets daily.  Her last TSH was 2.0 in February 2023 per our office.  She is taking her thyroid medication every morning on an empty stomach with water only.   No food or other medications for 30 minutes.   No heartburn medication, iron pills, calcium, vitamin D, or magnesium pills within four hours of taking levothyroxine.    2) Chronic Fatigue: She's felt tired and fatigued for the last year. She is not sleeping well. She typically goes to bed at 11 pm, wakes up twice nightly on average to urinate or let her dogs out. Sometimes it takes minutes to 2 hours to fall back asleep, will get up around 8 am.   She's been told that she snores. She has never been told that she stops breathing during the night. She has daytime drowsiness during the day, everyday. She will nap several times weekly ranging from 45 min to 2 hours. She has never undergone a sleep study.  Overall she feels improved on Zoloft 100 mg, does still experience breakthrough anxiety.   3) Fibromyalgia/Osteoarthritis: Evaluated by orthopedics in June 2023 with multiple joint pains for which she describes as stiffness and aching.  Given her symptoms she was referred to rheumatology for further work-up and evaluation.  Evaluated by rheumatology through [facility] was diagnosed with osteoarthritis and fibromyalgia.  She was directed back to her PCP for treatment of her fibromyalgia.  Currently managed on Meloxicam 15 mg for which she takes 1-2 times weekly on average. She is taking aspirin 81 mg  daily. She is trying to exercise to help with fibromyalgia, some days are better than others. She has never tried Cymbalta.   Review of Systems  Constitutional:  Positive for fatigue.  Musculoskeletal:  Positive for arthralgias.  Psychiatric/Behavioral:  Positive for sleep disturbance. The patient is nervous/anxious.          Past Medical History:  Diagnosis Date   CAD (coronary artery disease)    has documented coronary vasospasm but with underlying 40% prox LAD, 30% prox LCX and 70 to 80% proximal 1st DX and 30% RCA; recurrent vasospasm with subsequent cath in Jan 2013.    Coronary vasospasm (HCC)    Environmental and seasonal allergies    Esophagitis    GERD (gastroesophageal reflux disease)    Hyperlipidemia    Hypertension    Hypothyroidism    IBS (irritable bowel syndrome)    Palpitations    tachycardia on holter; treated with beta blockers   Right ovarian cyst     Social History   Socioeconomic History   Marital status: Married    Spouse name: Not on file   Number of children: 2   Years of education: Not on file   Highest education level: Not on file  Occupational History   Occupation: Unemployed  Tobacco Use   Smoking status: Former    Packs/day: 0.25    Years: 30.00    Total pack years: 7.50    Types: Cigarettes    Quit date: 05/30/2014    Years since quitting: 7.9   Smokeless  tobacco: Never  Vaping Use   Vaping Use: Never used  Substance and Sexual Activity   Alcohol use: Yes    Alcohol/week: 2.0 standard drinks of alcohol    Types: 2 Standard drinks or equivalent per week    Comment: occ   Drug use: No   Sexual activity: Yes  Other Topics Concern   Not on file  Social History Narrative   Married.   2 children.   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   She enjoys being with her dogs, shopping.    Social Determinants of Health   Financial Resource Strain: Not on file  Food Insecurity: Not on file  Transportation Needs: Not on file  Physical  Activity: Not on file  Stress: Not on file  Social Connections: Not on file  Intimate Partner Violence: Not on file    Past Surgical History:  Procedure Laterality Date   APPENDECTOMY  1976   BREAST EXCISIONAL BIOPSY Right    benign   BREAST SURGERY Right 2002   biopsy   [place]  05/2011   With documented vasospasm but with residual disease; managed medically   CHOLECYSTECTOMY  1982   open   INTRACAPSULAR CATARACT EXTRACTION Right    LAPAROSCOPIC SALPINGO OOPHERECTOMY Right 09/18/2020   Procedure: LAPAROSCOPIC SALPINGO OOPHORECTOMY;  Surgeon: Aloha Gell, MD;  Location: [facility];  Service: Gynecology;  Laterality: Right;   LEFT HEART CATH AND CORONARY ANGIOGRAPHY N/A 10/08/2021   Procedure: LEFT HEART CATH AND CORONARY ANGIOGRAPHY;  Surgeon: Burnell Blanks, MD;  Location: [facility];  Service: Cardiovascular;  Laterality: N/A;   LEFT HEART CATHETERIZATION WITH CORONARY ANGIOGRAM N/A 09/14/2011   Procedure: LEFT HEART CATHETERIZATION WITH CORONARY ANGIOGRAM;  Surgeon: Burnell Blanks, MD;  Location: [facility] CATH [facility];  Service: Cardiovascular;  Laterality: N/A;   UPPER GASTROINTESTINAL ENDOSCOPY  01/11/2011   esophagitis    Family History  Problem Relation Age of Onset   Breast cancer Mother 84   Heart disease Mother        died CHF 83   Diabetes Mother    Prostate cancer Father    Hyperlipidemia Father    Heart disease Father        s/p cabg - alive   Colon cancer Other        First cousin on dads side    Thyroid disease Maternal Grandmother     No Known Allergies  Current Outpatient Medications on File Prior to Visit  Medication Sig Dispense Refill   acetaminophen (TYLENOL) 500 MG tablet Take 500 mg by mouth every 6 (six) hours as needed. For pain     albuterol (VENTOLIN HFA) 108 (90 Base) MCG/ACT inhaler TAKE 2 PUFFS BY MOUTH EVERY 6 HOURS AS NEEDED 8.5 each 0   aspirin 81 MG  tablet Take 81 mg by mouth daily.     atorvastatin (LIPITOR) 20 MG tablet TAKE 1 TABLET BY MOUTH EVERY DAY 90 tablet 2   Cholecalciferol (VITAMIN D) 2000 units CAPS Take 2000 units once per day. 30 capsule    Cyanocobalamin (VITAMIN B-12 PO) Take by mouth.     diltiazem (TIAZAC) 300 MG 24 hr capsule TAKE 1 CAPSULE BY MOUTH EVERY DAY 90 capsule 2   fluticasone (FLONASE) 50 MCG/ACT nasal spray Place 2 sprays into both nostrils daily. (Patient taking differently: Place 2 sprays into both nostrils daily as needed for allergies.) 16 g 6   isosorbide dinitrate (ISORDIL) 30  MG tablet TAKE 1 TABLET BY MOUTH THREE TIMES A DAY 270 tablet 2   levothyroxine (SYNTHROID) 50 MCG tablet Take 1 tablet by mouth every morning on an empty stomach with water only.  No food or other medications for 30 minutes. 90 tablet 3   meloxicam (MOBIC) 15 MG tablet Take 1 tablet (15 mg total) by mouth daily. 30 tablet 3   nitroGLYCERIN (NITROSTAT) 0.4 MG SL tablet PLACE 1 TABLET (0.4 MG TOTAL) UNDER THE TONGUE EVERY 5 (FIVE) MINUTES AS NEEDED FOR CHEST PAIN. X 3 DOSES 25 tablet 3   Polyethyl Glycol-Propyl Glycol (SYSTANE) 0.4-0.3 % SOLN Place 1 drop into both eyes in the morning, at noon, in the evening, and at bedtime.     diclofenac Sodium (VOLTAREN) 1 % GEL Apply 2-4 g topically 4 (four) times daily. (Patient not taking: Reported on 05/26/2022) 200 g 2   levocetirizine (XYZAL) 5 MG tablet Take 1 tablet (5 mg total) by mouth every evening. For allergies (Patient not taking: Reported on 05/26/2022) 90 tablet 3   No current facility-administered medications on file prior to visit.    BP 130/68   Pulse 97   Temp 98.3 F (36.8 C) (Temporal)   Ht 5' 3.5" (1.613 m)   Wt 171 lb (77.6 kg)   LMP 07/28/2010   SpO2 100%   BMI 29.82 kg/m  Objective:   Physical Exam Cardiovascular:     Rate and Rhythm: Normal rate and regular rhythm.  Pulmonary:     Effort: Pulmonary effort is normal.     Breath sounds: Normal breath sounds.   Musculoskeletal:     Cervical back: Neck supple.  Skin:    General: Skin is warm and dry.  Psychiatric:        Mood and Affect: Mood normal.           Assessment & Plan:   Problem List Items Addressed This Visit       Endocrine   Hypothyroidism    TSH from February 2023 controlled She is taking levothyroxine correctly Continue levothyroxine 50 mcg daily        Other   Anxiety and depression    Improved with Zoloft 100 mg, but continues with breakthrough anxiety.  Wean off Zoloft. Start Cymbalta 30 mg daily for fibromyalgia pain and anxiety/depression. Follow up in 1 month.      Relevant Medications   DULoxetine (CYMBALTA) 30 MG capsule   Chronic fatigue    Continued.   Highly suspicious for sleep apnea as prior work up has been negative.  She agrees to proceed with sleep study, referral placed to pulmonology.   Working to treat anxiety/depression by switching to Cymbalta 30 mg daily.      Fibromyalgia    New diagnosis per rheumatology.  Encouraged daily physical activity. Continue Meloxicam 15 mg PRN.  Start Cymbalta 30 mg daily. Follow up in 1 month      Relevant Medications   DULoxetine (CYMBALTA) 30 MG capsule   RESOLVED: Snoring - Primary   Relevant Orders   Ambulatory referral to Pulmonology       Pleas Koch, NP

## 2022-05-26 NOTE — Assessment & Plan Note (Signed)
Improved with Zoloft 100 mg, but continues with breakthrough anxiety.  Wean off Zoloft. Start Cymbalta 30 mg daily for fibromyalgia pain and anxiety/depression. Follow up in 1 month.

## 2022-06-02 ENCOUNTER — Other Ambulatory Visit: Payer: Self-pay | Admitting: Primary Care

## 2022-06-02 DIAGNOSIS — J45909 Unspecified asthma, uncomplicated: Secondary | ICD-10-CM

## 2022-06-22 ENCOUNTER — Other Ambulatory Visit: Payer: Self-pay | Admitting: Cardiovascular Disease

## 2022-07-15 ENCOUNTER — Other Ambulatory Visit: Payer: Self-pay | Admitting: Primary Care

## 2022-07-15 DIAGNOSIS — J45909 Unspecified asthma, uncomplicated: Secondary | ICD-10-CM

## 2022-07-26 ENCOUNTER — Institutional Professional Consult (permissible substitution): Payer: BC Managed Care – PPO | Admitting: Primary Care

## 2022-07-29 ENCOUNTER — Institutional Professional Consult (permissible substitution): Payer: BC Managed Care – PPO | Admitting: Nurse Practitioner

## 2022-08-10 ENCOUNTER — Other Ambulatory Visit: Payer: Self-pay | Admitting: Primary Care

## 2022-08-10 DIAGNOSIS — J45909 Unspecified asthma, uncomplicated: Secondary | ICD-10-CM

## 2022-08-12 ENCOUNTER — Other Ambulatory Visit: Payer: Self-pay | Admitting: Cardiovascular Disease

## 2022-08-12 ENCOUNTER — Other Ambulatory Visit: Payer: Self-pay | Admitting: Primary Care

## 2022-08-12 DIAGNOSIS — M797 Fibromyalgia: Secondary | ICD-10-CM

## 2022-08-12 DIAGNOSIS — F419 Anxiety disorder, unspecified: Secondary | ICD-10-CM

## 2022-09-07 ENCOUNTER — Telehealth: Payer: BC Managed Care – PPO | Admitting: Family

## 2022-09-07 DIAGNOSIS — J069 Acute upper respiratory infection, unspecified: Secondary | ICD-10-CM

## 2022-09-07 MED ORDER — PREDNISONE 10 MG (21) PO TBPK: ORAL_TABLET | ORAL | 0 refills | Status: DC

## 2022-09-07 MED ORDER — BENZONATATE 100 MG PO CAPS: 100.0000 mg | ORAL_CAPSULE | Freq: Three times a day (TID) | ORAL | 0 refills | Status: DC | PRN

## 2022-09-07 NOTE — Progress Notes (Signed)
Virtual Visit Consent   Caroline [name], you are scheduled for a virtual visit with a [facility] provider today. Just as with appointments in the office, your consent must be obtained to participate. Your consent will be active for this visit and any virtual visit you may have with one of our providers in the next 365 days. If you have a MyChart account, a copy of this consent can be sent to you electronically.  As this is a virtual visit, video technology does not allow for your provider to perform a traditional examination. This may limit your provider's ability to fully assess your condition. If your provider identifies any concerns that need to be evaluated in person or the need to arrange testing (such as labs, EKG, etc.), we will make arrangements to do so. Although advances in technology are sophisticated, we cannot ensure that it will always work on either your end or our end. If the connection with a video visit is poor, the visit may have to be switched to a telephone visit. With either a video or telephone visit, we are not always able to ensure that we have a secure connection.  By engaging in this virtual visit, you consent to the provision of healthcare and authorize for your insurance to be billed (if applicable) for the services provided during this visit. Depending on your insurance coverage, you may receive a charge related to this service.  I need to obtain your verbal consent now. Are you willing to proceed with your visit today? Katyana Trolinger has provided verbal consent on 09/07/2022 for a virtual visit (video or telephone). Evelina Dun, FNP  Date: 09/07/2022 5:24 PM  Virtual Visit via Video Note   I, Evelina Dun, connected with  Caroline [name]  ([id number], 25-Mar-1961) on 09/07/22 at  5:45 PM EST by a video-enabled telemedicine application and verified that I am speaking with the correct person using two identifiers.  Location: Patient: Virtual Visit Location Patient:  Home Provider: Virtual Visit Location Provider: Home Office   I discussed the limitations of evaluation and management by telemedicine and the availability of in person appointments. The patient expressed understanding and agreed to proceed.    History of Present Illness: Caroline [name] is a 62 y.o. who identifies as a female who was assigned female at birth, and is being seen today for sinus congestion that started 5 days ago.  HPI: Sinusitis This is a new problem. The current episode started in the past 7 days. The problem has been gradually worsening since onset. There has been no fever. Her pain is at a severity of 7/10. The pain is moderate. Associated symptoms include congestion, coughing, ear pain, headaches, sinus pressure, sneezing and a sore throat. Past treatments include acetaminophen (flonase). The treatment provided mild relief.    Problems:  Patient Active Problem List   Diagnosis Date Noted   Fibromyalgia 05/26/2022   Chronic fatigue 10/16/2021   Status post carpal tunnel release 07/01/2021   Neck stiffness 12/24/2020   Myalgia 12/24/2020   Preoperative clearance 09/10/2020   Adnexal mass 02/21/2020   Multiple renal cysts 02/21/2020   Anxiety and depression 04/26/2019   Hypothyroidism 03/15/2019   Environmental and seasonal allergies 06/09/2018   Encounter for annual general medical examination with abnormal findings in adult 06/09/2018   Prediabetes 06/09/2018   Recurrent cough 09/26/2017   Hypertension    Lateral epicondylitis of left elbow 10/07/2016   Carpal tunnel syndrome, right upper limb 10/07/2016   Intrinsic asthma 08/06/2014  Dyslipidemia 02/13/2014   Closed fracture of right humerus with routine healing 02/06/2013   IBS (irritable bowel syndrome) 09/15/2011   Nonspecific elevation of levels of transaminase or lactic acid dehydrogenase (LDH) 09/15/2011   Coronary vasospasm (HCC)    Tachycardia 07/28/2011   History of esophagitis 07/28/2011   CAD  (coronary artery disease) 05/28/2011   Epigastric pain 12/04/2010    Allergies: No Known Allergies Medications:  Current Outpatient Medications:    benzonatate (TESSALON PERLES) 100 MG capsule, Take 1 capsule (100 mg total) by mouth 3 (three) times daily as needed., Disp: 20 capsule, Rfl: 0   predniSONE (STERAPRED UNI-PAK 21 TAB) 10 MG (21) TBPK tablet, Use as directed, Disp: 21 tablet, Rfl: 0   acetaminophen (TYLENOL) 500 MG tablet, Take 500 mg by mouth every 6 (six) hours as needed. For pain, Disp: , Rfl:    albuterol (VENTOLIN HFA) 108 (90 Base) MCG/ACT inhaler, INHALE 2 PUFFS BY MOUTH EVERY 6 HOURS AS NEEDED, Disp: 8.5 each, Rfl: 0   aspirin 81 MG tablet, Take 81 mg by mouth daily., Disp: , Rfl:    atorvastatin (LIPITOR) 20 MG tablet, TAKE 1 TABLET BY MOUTH EVERY DAY, Disp: 90 tablet, Rfl: 1   Cholecalciferol (VITAMIN D) 2000 units CAPS, Take 2000 units once per day., Disp: 30 capsule, Rfl:    Cyanocobalamin (VITAMIN B-12 PO), Take by mouth., Disp: , Rfl:    diclofenac Sodium (VOLTAREN) 1 % GEL, Apply 2-4 g topically 4 (four) times daily. (Patient not taking: Reported on 05/26/2022), Disp: 200 g, Rfl: 2   diltiazem (TIAZAC) 300 MG 24 hr capsule, TAKE 1 CAPSULE BY MOUTH EVERY DAY, Disp: 90 capsule, Rfl: 2   DULoxetine (CYMBALTA) 30 MG capsule, TAKE 1 CAPSULE (30 MG TOTAL) BY MOUTH DAILY. FOR ANXIETY, DEPRESSION, PAIN, Disp: 90 capsule, Rfl: 2   fluticasone (FLONASE) 50 MCG/ACT nasal spray, Place 2 sprays into both nostrils daily. (Patient taking differently: Place 2 sprays into both nostrils daily as needed for allergies.), Disp: 16 g, Rfl: 6   isosorbide dinitrate (ISORDIL) 30 MG tablet, TAKE 1 TABLET BY MOUTH THREE TIMES A DAY, Disp: 270 tablet, Rfl: 2   levocetirizine (XYZAL) 5 MG tablet, Take 1 tablet (5 mg total) by mouth every evening. For allergies (Patient not taking: Reported on 05/26/2022), Disp: 90 tablet, Rfl: 3   levothyroxine (SYNTHROID) 50 MCG tablet, Take 1 tablet by mouth every  morning on an empty stomach with water only.  No food or other medications for 30 minutes., Disp: 90 tablet, Rfl: 3   meloxicam (MOBIC) 15 MG tablet, Take 1 tablet (15 mg total) by mouth daily., Disp: 30 tablet, Rfl: 3   nitroGLYCERIN (NITROSTAT) 0.4 MG SL tablet, PLACE 1 TABLET (0.4 MG TOTAL) UNDER THE TONGUE EVERY 5 MINUTES AS NEEDED FOR CHEST PAIN. X 3 DOSES.  Please call [phone] to schedule an appointment for future refills. Thank you., Disp: 75 tablet, Rfl: 0   Polyethyl Glycol-Propyl Glycol (SYSTANE) 0.4-0.3 % SOLN, Place 1 drop into both eyes in the morning, at noon, in the evening, and at bedtime., Disp: , Rfl:   Observations/Objective: Patient is well-developed, well-nourished in no acute distress.  Resting comfortably at home.  Head is normocephalic, atraumatic.  No labored breathing.  Speech is clear and coherent with logical content.  Patient is alert and oriented at baseline.  Hoarse voice Nonproductive cough  Assessment and Plan: 1. Viral URI - predniSONE (STERAPRED UNI-PAK 21 TAB) 10 MG (21) TBPK tablet; Use as directed  Dispense:  21 tablet; Refill: 0 - benzonatate (TESSALON PERLES) 100 MG capsule; Take 1 capsule (100 mg total) by mouth 3 (three) times daily as needed.  Dispense: 20 capsule; Refill: 0  - Take meds as prescribed - Use a cool mist humidifier  -Use saline nose sprays frequently -Force fluids -For any cough or congestion  Use plain Mucinex- regular strength or max strength is fine -For fever or aces or pains- take tylenol or ibuprofen. -Throat lozenges if help -New toothbrush in 3 days -Follow up if symptoms worsen or do not improve    Follow Up Instructions: I discussed the assessment and treatment plan with the patient. The patient was provided an opportunity to ask questions and all were answered. The patient agreed with the plan and demonstrated an understanding of the instructions.  A copy of instructions were sent to the patient via MyChart  unless otherwise noted below.     The patient was advised to call back or seek an in-person evaluation if the symptoms worsen or if the condition fails to improve as anticipated.  Time:  I spent 8 minutes with the patient via telehealth technology discussing the above problems/concerns.    Evelina Dun, FNP

## 2022-09-07 NOTE — Patient Instructions (Signed)

## 2022-09-20 ENCOUNTER — Other Ambulatory Visit: Payer: Self-pay | Admitting: Primary Care

## 2022-09-20 DIAGNOSIS — E039 Hypothyroidism, unspecified: Secondary | ICD-10-CM

## 2022-09-20 NOTE — Telephone Encounter (Signed)
Patient is due for CPE/follow up in March. Please schedule, thank you!

## 2022-09-21 ENCOUNTER — Telehealth: Payer: Self-pay | Admitting: Primary Care

## 2022-09-21 ENCOUNTER — Other Ambulatory Visit: Payer: Self-pay | Admitting: Primary Care

## 2022-09-21 DIAGNOSIS — F32A Depression, unspecified: Secondary | ICD-10-CM

## 2022-09-21 NOTE — Telephone Encounter (Signed)
Yes, of course! Happy to see him at his convenience .

## 2022-09-21 NOTE — Telephone Encounter (Signed)
Patient was wanting to know if her son could transfer his care over to [facility]. She stated that he is away at college right now and was seeing Dr. Einar Pheasant but will need a physical when he comes home in March. Please advise. Thank you!

## 2022-09-21 NOTE — Telephone Encounter (Signed)
Patient son has been scheduled. She said to tell Anda Kraft thank you very much.

## 2022-09-21 NOTE — Telephone Encounter (Signed)
Patient has been scheduled

## 2022-10-21 ENCOUNTER — Ambulatory Visit (INDEPENDENT_AMBULATORY_CARE_PROVIDER_SITE_OTHER): Payer: BC Managed Care – PPO | Admitting: Primary Care

## 2022-10-21 ENCOUNTER — Encounter: Payer: Self-pay | Admitting: Primary Care

## 2022-10-21 VITALS — BP 136/82 | HR 78 | Temp 98.4°F | Ht 64.0 in | Wt 176.0 lb

## 2022-10-21 DIAGNOSIS — E039 Hypothyroidism, unspecified: Secondary | ICD-10-CM

## 2022-10-21 DIAGNOSIS — Z0000 Encounter for general adult medical examination without abnormal findings: Secondary | ICD-10-CM | POA: Diagnosis not present

## 2022-10-21 DIAGNOSIS — E785 Hyperlipidemia, unspecified: Secondary | ICD-10-CM

## 2022-10-21 DIAGNOSIS — R7303 Prediabetes: Secondary | ICD-10-CM | POA: Diagnosis not present

## 2022-10-21 DIAGNOSIS — I2510 Atherosclerotic heart disease of native coronary artery without angina pectoris: Secondary | ICD-10-CM | POA: Diagnosis not present

## 2022-10-21 DIAGNOSIS — I10 Essential (primary) hypertension: Secondary | ICD-10-CM | POA: Diagnosis not present

## 2022-10-21 DIAGNOSIS — J3089 Other allergic rhinitis: Secondary | ICD-10-CM

## 2022-10-21 DIAGNOSIS — Z122 Encounter for screening for malignant neoplasm of respiratory organs: Secondary | ICD-10-CM

## 2022-10-21 DIAGNOSIS — R000 Tachycardia, unspecified: Secondary | ICD-10-CM

## 2022-10-21 DIAGNOSIS — F32A Depression, unspecified: Secondary | ICD-10-CM

## 2022-10-21 DIAGNOSIS — M797 Fibromyalgia: Secondary | ICD-10-CM

## 2022-10-21 DIAGNOSIS — F419 Anxiety disorder, unspecified: Secondary | ICD-10-CM

## 2022-10-21 DIAGNOSIS — J45909 Unspecified asthma, uncomplicated: Secondary | ICD-10-CM

## 2022-10-21 LAB — COMPREHENSIVE METABOLIC PANEL
ALT: 20 U/L (ref 0–35)
AST: 20 U/L (ref 0–37)
Albumin: 4.6 g/dL (ref 3.5–5.2)
Alkaline Phosphatase: 87 U/L (ref 39–117)
BUN: 13 mg/dL (ref 6–23)
CO2: 29 mEq/L (ref 19–32)
Calcium: 9.8 mg/dL (ref 8.4–10.5)
Chloride: 101 mEq/L (ref 96–112)
Creatinine, Ser: 0.65 mg/dL (ref 0.40–1.20)
GFR: 93.84 mL/min (ref >60.00)
Glucose, Bld: 93 mg/dL (ref 70–99)
Potassium: 4.6 mEq/L (ref 3.5–5.1)
Sodium: 139 mEq/L (ref 135–145)
Total Bilirubin: 0.4 mg/dL (ref 0.2–1.2)
Total Protein: 7.4 g/dL (ref 6.0–8.3)

## 2022-10-21 LAB — LIPID PANEL
Cholesterol: 169 mg/dL (ref 0–200)
HDL: 73.1 mg/dL (ref >39.00)
LDL Cholesterol: 83 mg/dL (ref 0–99)
NonHDL: 96.38
Total CHOL/HDL Ratio: 2
Triglycerides: 68 mg/dL (ref 0.0–149.0)
VLDL: 13.6 mg/dL (ref 0.0–40.0)

## 2022-10-21 LAB — HEMOGLOBIN A1C: Hgb A1c MFr Bld: 6.1 % (ref 4.6–6.5)

## 2022-10-21 LAB — TSH: TSH: 2.03 u[IU]/mL (ref 0.35–5.50)

## 2022-10-21 MED ORDER — LEVOCETIRIZINE DIHYDROCHLORIDE 5 MG PO TABS: 5.0000 mg | ORAL_TABLET | Freq: Every evening | ORAL | 3 refills | Status: DC

## 2022-10-21 MED ORDER — DULOXETINE HCL 60 MG PO CPEP: 60.0000 mg | ORAL_CAPSULE | Freq: Every day | ORAL | 3 refills | Status: DC

## 2022-10-21 MED ORDER — ALBUTEROL SULFATE HFA 108 (90 BASE) MCG/ACT IN AERS: 2.0000 | INHALATION_SPRAY | Freq: Four times a day (QID) | RESPIRATORY_TRACT | 0 refills | Status: DC | PRN

## 2022-10-21 NOTE — Assessment & Plan Note (Signed)
Overall controlled, slightly higher today than usual.  Continue diltiazem ER 300 mg daily, Isosorbide 30 mg TID. CMP pending.

## 2022-10-21 NOTE — Assessment & Plan Note (Signed)
Asymptomatic.   Continue lipid control, BP control. Following with cardiology. Office notes reviewed from February 2023.

## 2022-10-21 NOTE — Assessment & Plan Note (Signed)
Controlled.  Continue diltiazem ER 300 mg daily.

## 2022-10-21 NOTE — Assessment & Plan Note (Signed)
Overall controlled.  Continue albuterol inhaler PRN. Refill provided.

## 2022-10-21 NOTE — Assessment & Plan Note (Signed)
She is taking levothyroxine correctly.  Continue levothyroxine 50 mcg daily. Repeat TSH pending.

## 2022-10-21 NOTE — Assessment & Plan Note (Signed)
Deteriorated.  Agree to increase Cymbalta 60 mg daily. New Rx provided.

## 2022-10-21 NOTE — Patient Instructions (Signed)
We increased your dose of Cymbalta to 60 mg daily.   Stop by the lab prior to leaving today. I will notify you of your results once received.   You will either be contacted via phone regarding your referral to lung cancer screening, or you may receive a letter on your MyChart portal from our referral team with instructions for scheduling an appointment. Please let us know if you have not been contacted by anyone within two weeks.  It was a pleasure to see you today!

## 2022-10-21 NOTE — Assessment & Plan Note (Signed)
Increase Cymbalta to 60 mg daily per patient request.

## 2022-10-21 NOTE — Progress Notes (Signed)
Subjective:    Patient ID: Caroline [name], female    DOB: 1961/05/07, 62 y.o.   MRN: [id number]  HPI  Caroline [name] is a very pleasant 62 y.o. female who presents today for complete physical and follow up of chronic conditions.  She would like a dose increase of her Cymbalta for symptoms of depression, feeling down/sad, and increased stress. Overall she's done well on Cymbalta 30 mg.   Immunizations: -Tetanus: Completed in 2023 -Influenza: Completed this season -Shingles: Completed Shingrix series  Diet: Fair diet.  Exercise: No regular exercise.  Eye exam: Completes annually  Dental exam: Completes semi-annually   Pap Smear: Completed in 2022 Mammogram: Completed in April 2023 and breast MRI in May 2023  Colonoscopy: Completed in 2016, due 2026 Lung Cancer Screening: Never completed. Smoker of 30 years, quit 4 years ago, smoked 1/2 PPD.    BP Readings from Last 3 Encounters:  10/21/22 136/82  05/26/22 130/68  12/29/21 118/68       Review of Systems  Constitutional:  Negative for unexpected weight change.  HENT:  Positive for rhinorrhea.   Respiratory:  Negative for cough and shortness of breath.   Cardiovascular:  Negative for chest pain.  Gastrointestinal:  Negative for constipation and diarrhea.  Genitourinary:  Negative for difficulty urinating.  Musculoskeletal:  Positive for arthralgias.  Skin:  Negative for rash.  Allergic/Immunologic: Positive for environmental allergies.  Neurological:  Negative for dizziness and headaches.  Psychiatric/Behavioral:  The patient is nervous/anxious.          Past Medical History:  Diagnosis Date   Adnexal mass 02/21/2020   CAD (coronary artery disease)    has documented coronary vasospasm but with underlying 40% prox LAD, 30% prox LCX and 70 to 80% proximal 1st DX and 30% RCA; recurrent vasospasm with subsequent cath in Jan 2013.    Closed fracture of right humerus with routine healing 02/06/2013   Coronary  vasospasm (HCC)    Environmental and seasonal allergies    Esophagitis    GERD (gastroesophageal reflux disease)    Hyperlipidemia    Hypertension    Hypothyroidism    IBS (irritable bowel syndrome)    Palpitations    tachycardia on holter; treated with beta blockers   Right ovarian cyst     Social History   Socioeconomic History   Marital status: Married    Spouse name: Not on file   Number of children: 2   Years of education: Not on file   Highest education level: Not on file  Occupational History   Occupation: Unemployed  Tobacco Use   Smoking status: Former    Packs/day: 0.25    Years: 30.00    Total pack years: 7.50    Types: Cigarettes    Quit date: 05/30/2014    Years since quitting: 8.4   Smokeless tobacco: Never  Vaping Use   Vaping Use: Never used  Substance and Sexual Activity   Alcohol use: Yes    Alcohol/week: 2.0 standard drinks of alcohol    Types: 2 Standard drinks or equivalent per week    Comment: occ   Drug use: No   Sexual activity: Yes  Other Topics Concern   Not on file  Social History Narrative   Married.   2 children.   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   She enjoys being with her dogs, shopping.    Social Determinants of Health   Financial Resource Strain: Not on file  Food Insecurity: Not on file  Transportation Needs: Not on file  Physical Activity: Not on file  Stress: Not on file  Social Connections: Not on file  Intimate Partner Violence: Not on file    Past Surgical History:  Procedure Laterality Date   APPENDECTOMY  1976   BREAST EXCISIONAL BIOPSY Right    benign   BREAST SURGERY Right 2002   biopsy   [place]  05/2011   With documented vasospasm but with residual disease; managed medically   CHOLECYSTECTOMY  1982   open   INTRACAPSULAR CATARACT EXTRACTION Right    LAPAROSCOPIC SALPINGO OOPHERECTOMY Right 09/18/2020   Procedure: LAPAROSCOPIC SALPINGO OOPHORECTOMY;   Surgeon: Aloha Gell, MD;  Location: [place];  Service: Gynecology;  Laterality: Right;   LEFT HEART CATH AND CORONARY ANGIOGRAPHY N/A 10/08/2021   Procedure: LEFT HEART CATH AND CORONARY ANGIOGRAPHY;  Surgeon: Burnell Blanks, MD;  Location: [place] [facility];  Service: Cardiovascular;  Laterality: N/A;   LEFT HEART CATHETERIZATION WITH CORONARY ANGIOGRAM N/A 09/14/2011   Procedure: LEFT HEART CATHETERIZATION WITH CORONARY ANGIOGRAM;  Surgeon: Burnell Blanks, MD;  Location[facility] CATH [facility];  Service: Cardiovascular;  Laterality: N/A;   UPPER GASTROINTESTINAL ENDOSCOPY  01/11/2011   esophagitis    Family History  Problem Relation Age of Onset   Breast cancer Mother 34   Heart disease Mother        died CHF 35   Diabetes Mother    Prostate cancer Father    Hyperlipidemia Father    Heart disease Father        s/p cabg - alive   Colon cancer Other        First cousin on dads side    Thyroid disease Maternal Grandmother     No Known Allergies  Current Outpatient Medications on File Prior to Visit  Medication Sig Dispense Refill   acetaminophen (TYLENOL) 500 MG tablet Take 500 mg by mouth every 6 (six) hours as needed. For pain     aspirin 81 MG tablet Take 81 mg by mouth daily.     atorvastatin (LIPITOR) 20 MG tablet TAKE 1 TABLET BY MOUTH EVERY DAY 90 tablet 1   Cholecalciferol (VITAMIN D) 2000 units CAPS Take 2000 units once per day. 30 capsule    Cyanocobalamin (VITAMIN B-12 PO) Take by mouth.     diltiazem (TIAZAC) 300 MG 24 hr capsule TAKE 1 CAPSULE BY MOUTH EVERY DAY 90 capsule 2   fluticasone (FLONASE) 50 MCG/ACT nasal spray Place 2 sprays into both nostrils daily. (Patient taking differently: Place 2 sprays into both nostrils daily as needed for allergies.) 16 g 6   isosorbide dinitrate (ISORDIL) 30 MG tablet TAKE 1 TABLET BY MOUTH THREE TIMES A DAY 270 tablet 2   levothyroxine (SYNTHROID) 50 MCG tablet TAKE 1 TABLET BY MOUTH EVERY MORNING  ON EMPTY STOMACH WITH WATER NO FOOD OR OTHER MEDS FOR 30MINS 90 tablet 0   meloxicam (MOBIC) 15 MG tablet Take 1 tablet (15 mg total) by mouth daily. 30 tablet 3   nitroGLYCERIN (NITROSTAT) 0.4 MG SL tablet PLACE 1 TABLET (0.4 MG TOTAL) UNDER THE TONGUE EVERY 5 MINUTES AS NEEDED FOR CHEST PAIN. X 3 DOSES.  Please call [phone] to schedule an appointment for future refills. Thank you. 75 tablet 0   Polyethyl Glycol-Propyl Glycol (SYSTANE) 0.4-0.3 % SOLN Place 1 drop into both eyes in the morning, at noon, in the evening, and at bedtime.     diclofenac  Sodium (VOLTAREN) 1 % GEL Apply 2-4 g topically 4 (four) times daily. (Patient not taking: Reported on 05/26/2022) 200 g 2   No current facility-administered medications on file prior to visit.    BP 136/82   Pulse 78   Temp 98.4 F (36.9 C) (Temporal)   Ht 5' 4"$  (1.626 m)   Wt 176 lb (79.8 kg)   LMP 07/28/2010   SpO2 98%   BMI 30.21 kg/m  Objective:   Physical Exam HENT:     Right Ear: Tympanic membrane and ear canal normal.     Left Ear: Tympanic membrane and ear canal normal.     Nose: Nose normal.  Eyes:     Conjunctiva/sclera: Conjunctivae normal.     Pupils: Pupils are equal, round, and reactive to light.  Neck:     Thyroid: No thyromegaly.  Cardiovascular:     Rate and Rhythm: Normal rate and regular rhythm.     Heart sounds: No murmur heard. Pulmonary:     Effort: Pulmonary effort is normal.     Breath sounds: Normal breath sounds. No rales.  Abdominal:     General: Bowel sounds are normal.     Palpations: Abdomen is soft.     Tenderness: There is no abdominal tenderness.  Musculoskeletal:        General: Normal range of motion.     Cervical back: Neck supple.  Lymphadenopathy:     Cervical: No cervical adenopathy.  Skin:    General: Skin is warm and dry.     Findings: No rash.  Neurological:     Mental Status: She is alert and oriented to person, place, and time.     Cranial Nerves: No cranial nerve deficit.      Deep Tendon Reflexes: Reflexes are normal and symmetric.  Psychiatric:        Mood and Affect: Mood normal.           Assessment & Plan:  Preventative health care Assessment & Plan: Immunizations UTD.  Mammogram UTD. Colonoscopy UTD, due 2026. Referral placed for lung cancer screening  Discussed the importance of a healthy diet and regular exercise in order for weight loss, and to reduce the risk of further co-morbidity.  Exam stable. Labs pending.  Follow up in 1 year for repeat physical.    Environmental and seasonal allergies -     Levocetirizine Dihydrochloride; Take 1 tablet (5 mg total) by mouth every evening. For allergies  Dispense: 90 tablet; Refill: 3  Coronary artery disease involving native coronary artery of native heart without angina pectoris Assessment & Plan: Asymptomatic.   Continue lipid control, BP control. Following with cardiology. Office notes reviewed from February 2023.   Primary hypertension Assessment & Plan: Overall controlled, slightly higher today than usual.  Continue diltiazem ER 300 mg daily, Isosorbide 30 mg TID. CMP pending.   Intrinsic asthma Assessment & Plan: Overall controlled.  Continue albuterol inhaler PRN. Refill provided.   Orders: -     Albuterol Sulfate HFA; Inhale 2 puffs into the lungs every 6 (six) hours as needed.  Dispense: 8.5 each; Refill: 0  Anxiety and depression Assessment & Plan: Deteriorated.  Agree to increase Cymbalta 60 mg daily. New Rx provided.   Orders: -     DULoxetine HCl; Take 1 capsule (60 mg total) by mouth daily. For anxiety, depression, pain  Dispense: 90 capsule; Refill: 3  Fibromyalgia Assessment & Plan: Increase Cymbalta to 60 mg daily per patient request.  Orders: -  DULoxetine HCl; Take 1 capsule (60 mg total) by mouth daily. For anxiety, depression, pain  Dispense: 90 capsule; Refill: 3  Acquired hypothyroidism Assessment & Plan: She is taking levothyroxine  correctly.  Continue levothyroxine 50 mcg daily. Repeat TSH pending.  Orders: -     TSH  Prediabetes -     Hemoglobin A1c -     Comprehensive metabolic panel  Dyslipidemia Assessment & Plan: Continue atorvastatin 20 mg daily. Repeat lipid panel pending.  Orders: -     Lipid panel -     Comprehensive metabolic panel  Screening for lung cancer -     Ambulatory Referral for Lung Cancer Scre  Tachycardia Assessment & Plan: Controlled.  Continue diltiazem ER 300 mg daily.         Pleas Koch, NP

## 2022-10-21 NOTE — Assessment & Plan Note (Signed)
Continue atorvastatin 20 mg daily. Repeat lipid panel pending.

## 2022-10-21 NOTE — Assessment & Plan Note (Signed)
Immunizations UTD.  Mammogram UTD. Colonoscopy UTD, due 2026. Referral placed for lung cancer screening  Discussed the importance of a healthy diet and regular exercise in order for weight loss, and to reduce the risk of further co-morbidity.  Exam stable. Labs pending.  Follow up in 1 year for repeat physical.

## 2022-11-18 ENCOUNTER — Other Ambulatory Visit: Payer: Self-pay | Admitting: Orthopaedic Surgery

## 2022-11-19 ENCOUNTER — Other Ambulatory Visit: Payer: Self-pay | Admitting: Primary Care

## 2022-11-19 DIAGNOSIS — J45909 Unspecified asthma, uncomplicated: Secondary | ICD-10-CM

## 2022-12-01 ENCOUNTER — Other Ambulatory Visit: Payer: Self-pay | Admitting: Cardiovascular Disease

## 2022-12-10 DIAGNOSIS — D225 Melanocytic nevi of trunk: Secondary | ICD-10-CM | POA: Diagnosis not present

## 2022-12-10 DIAGNOSIS — L820 Inflamed seborrheic keratosis: Secondary | ICD-10-CM | POA: Diagnosis not present

## 2022-12-10 DIAGNOSIS — D2262 Melanocytic nevi of left upper limb, including shoulder: Secondary | ICD-10-CM | POA: Diagnosis not present

## 2022-12-10 DIAGNOSIS — D2272 Melanocytic nevi of left lower limb, including hip: Secondary | ICD-10-CM | POA: Diagnosis not present

## 2022-12-10 DIAGNOSIS — D2261 Melanocytic nevi of right upper limb, including shoulder: Secondary | ICD-10-CM | POA: Diagnosis not present

## 2022-12-10 DIAGNOSIS — D485 Neoplasm of uncertain behavior of skin: Secondary | ICD-10-CM | POA: Diagnosis not present

## 2022-12-17 ENCOUNTER — Other Ambulatory Visit: Payer: Self-pay | Admitting: Cardiovascular Disease

## 2022-12-19 ENCOUNTER — Other Ambulatory Visit: Payer: Self-pay | Admitting: Primary Care

## 2022-12-19 DIAGNOSIS — E039 Hypothyroidism, unspecified: Secondary | ICD-10-CM

## 2022-12-31 ENCOUNTER — Other Ambulatory Visit: Payer: Self-pay | Admitting: Cardiovascular Disease

## 2022-12-31 MED ORDER — DILTIAZEM HCL ER BEADS 300 MG PO CP24: 300.0000 mg | ORAL_CAPSULE | Freq: Every day | ORAL | 0 refills | Status: DC

## 2023-01-04 DIAGNOSIS — Z01419 Encounter for gynecological examination (general) (routine) without abnormal findings: Secondary | ICD-10-CM | POA: Diagnosis not present

## 2023-01-04 DIAGNOSIS — Z1231 Encounter for screening mammogram for malignant neoplasm of breast: Secondary | ICD-10-CM | POA: Diagnosis not present

## 2023-01-04 LAB — HM MAMMOGRAPHY

## 2023-01-09 ENCOUNTER — Other Ambulatory Visit: Payer: Self-pay | Admitting: Cardiovascular Disease

## 2023-01-13 ENCOUNTER — Other Ambulatory Visit: Payer: Self-pay | Admitting: Cardiovascular Disease

## 2023-01-13 ENCOUNTER — Other Ambulatory Visit: Payer: Self-pay | Admitting: Primary Care

## 2023-01-13 DIAGNOSIS — J45909 Unspecified asthma, uncomplicated: Secondary | ICD-10-CM

## 2023-01-14 ENCOUNTER — Other Ambulatory Visit: Payer: Self-pay | Admitting: Cardiovascular Disease

## 2023-01-17 ENCOUNTER — Other Ambulatory Visit: Payer: Self-pay | Admitting: Cardiovascular Disease

## 2023-01-26 ENCOUNTER — Other Ambulatory Visit: Payer: Self-pay | Admitting: Cardiovascular Disease

## 2023-02-12 ENCOUNTER — Other Ambulatory Visit: Payer: Self-pay | Admitting: Cardiovascular Disease

## 2023-02-14 ENCOUNTER — Other Ambulatory Visit: Payer: Self-pay | Admitting: Cardiovascular Disease

## 2023-02-15 ENCOUNTER — Encounter: Payer: Self-pay | Admitting: Emergency Medicine

## 2023-02-16 ENCOUNTER — Encounter: Payer: Self-pay | Admitting: Primary Care

## 2023-02-16 ENCOUNTER — Ambulatory Visit: Payer: BC Managed Care – PPO | Admitting: Primary Care

## 2023-02-16 VITALS — BP 128/66 | HR 80 | Temp 97.2°F | Ht 64.0 in | Wt 178.0 lb

## 2023-02-16 DIAGNOSIS — I83813 Varicose veins of bilateral lower extremities with pain: Secondary | ICD-10-CM | POA: Diagnosis not present

## 2023-02-16 DIAGNOSIS — R0683 Snoring: Secondary | ICD-10-CM | POA: Diagnosis not present

## 2023-02-16 NOTE — Patient Instructions (Signed)
You will either be contacted via phone regarding your referral to pulmonology, or you may receive a letter on your MyChart portal from our referral team with instructions for scheduling an appointment. Please let us know if you have not been contacted by anyone within two weeks.  Let me know if you experience any problems with the vascular services referral.  It was a pleasure to see you today!

## 2023-02-16 NOTE — Assessment & Plan Note (Signed)
Referral placed to vascular services per patient request.

## 2023-02-16 NOTE — Assessment & Plan Note (Signed)
New referral placed for pulmonology for sleep study.

## 2023-02-16 NOTE — Progress Notes (Signed)
Subjective:    Patient ID: Caroline [name], female    DOB: 30-Apr-1961, 62 y.o.   MRN: [id number]  HPI  Caroline [name] is a very pleasant 62 y.o. female with a history of CAD, hypertension, hypothyroidism, hyperlipidemia, prediabetes, fibromyalgia, tobacco use who presents today to discuss multiple concerns.  1) Sleep Disturbance: History of chronic fatigue for greater than 1 year with sleep disturbance, daytime drowsiness, snoring.  She wakes up several times during the night to urinate or let her dogs out.  During her visit in September 2023 she was referred to pulmonology for sleep study, she never attended that appointment as she had a personal emergency.   She is needing a new referral to pulmonology to complete the sleep study.   2) Varicose Veins: Chronic history of varicose and spider veins for years to bilateral lower extremities, worst at ankles. When she stands or walks for prolonged periods of time she will experience burning and swelling to her ankles.   She has an appointment scheduled with [facility] on [address] in [place]. She is needing a referral for insurance purposes.   BP Readings from Last 3 Encounters:  02/16/23 128/66  10/21/22 136/82  05/26/22 130/68      Review of Systems  Constitutional:  Positive for fatigue.  Respiratory:         Snoring  Cardiovascular:  Negative for chest pain.       Varicose veins  Psychiatric/Behavioral:  Positive for sleep disturbance.          Past Medical History:  Diagnosis Date   Adnexal mass 02/21/2020   CAD (coronary artery disease)    has documented coronary vasospasm but with underlying 40% prox LAD, 30% prox LCX and 70 to 80% proximal 1st DX and 30% RCA; recurrent vasospasm with subsequent cath in Jan 2013.    Closed fracture of right humerus with routine healing 02/06/2013   Coronary vasospasm (HCC)    Environmental and seasonal allergies    Esophagitis    GERD (gastroesophageal reflux  disease)    Hyperlipidemia    Hypertension    Hypothyroidism    IBS (irritable bowel syndrome)    Palpitations    tachycardia on holter; treated with beta blockers   Right ovarian cyst     Social History   Socioeconomic History   Marital status: Married    Spouse name: Not on file   Number of children: 2   Years of education: Not on file   Highest education level: Associate degree: academic program  Occupational History   Occupation: Unemployed  Tobacco Use   Smoking status: Former    Packs/day: 0.25    Years: 30.00    Additional pack years: 0.00    Total pack years: 7.50    Types: Cigarettes    Quit date: 05/30/2014    Years since quitting: 8.7   Smokeless tobacco: Never  Vaping Use   Vaping Use: Never used  Substance and Sexual Activity   Alcohol use: Yes    Alcohol/week: 2.0 standard drinks of alcohol    Types: 2 Standard drinks or equivalent per week    Comment: occ   Drug use: No   Sexual activity: Yes  Other Topics Concern   Not on file  Social History Narrative   Married.   2 children.   Retired, once worked in Chief Financial Officer.   She enjoys being with her dogs, shopping.    Social Determinants of Corporate investment banker  Strain: Low Risk  (02/15/2023)   Overall Financial Resource Strain (CARDIA)    Difficulty of Paying Living Expenses: Not hard at all  Food Insecurity: No Food Insecurity (02/15/2023)   Hunger Vital Sign    Worried About Running Out of Food in the Last Year: Never true    Ran Out of Food in the Last Year: Never true  Transportation Needs: No Transportation Needs (02/15/2023)   PRAPARE - Administrator, Civil Service (Medical): No    Lack of Transportation (Non-Medical): No  Physical Activity: Sufficiently Active (02/15/2023)   Exercise Vital Sign    Days of Exercise per Week: 3 days    Minutes of Exercise per Session: 90 min  Stress: No Stress Concern Present (02/15/2023)   [employer] of Occupational Health -  Occupational Stress Questionnaire    Feeling of Stress : Only a little  Social Connections: Unknown (02/15/2023)   Social Connection and Isolation Panel [NHANES]    Frequency of Communication with Friends and Family: Twice a week    Frequency of Social Gatherings with Friends and Family: Never    Attends Religious Services: Patient declined    Database administrator or Organizations: No    Attends Engineer, structural: Not on file    Marital Status: Married  Catering manager Violence: Not on file    Past Surgical History:  Procedure Laterality Date   APPENDECTOMY  1976   BREAST EXCISIONAL BIOPSY Right    benign   BREAST SURGERY Right 2002   biopsy   CARDIAC CATHETERIZATION  2011   CARDIAC CATHETERIZATION  05/2011   With documented vasospasm but with residual disease; managed medically   CHOLECYSTECTOMY  1982   open   INTRACAPSULAR CATARACT EXTRACTION Right    LAPAROSCOPIC SALPINGO OOPHERECTOMY Right 09/18/2020   Procedure: LAPAROSCOPIC SALPINGO OOPHORECTOMY;  Surgeon: Noland Fordyce, MD;  Location: [facility];  Service: Gynecology;  Laterality: Right;   LEFT HEART CATH AND CORONARY ANGIOGRAPHY N/A 10/08/2021   Procedure: LEFT HEART CATH AND CORONARY ANGIOGRAPHY;  Surgeon: Kathleene Hazel, MD;  Location: [facility];  Service: Cardiovascular;  Laterality: N/A;   LEFT HEART CATHETERIZATION WITH CORONARY ANGIOGRAM N/A 09/14/2011   Procedure: LEFT HEART CATHETERIZATION WITH CORONARY ANGIOGRAM;  Surgeon: Kathleene Hazel, MD;  Location: [facility] CATH LAB;  Service: Cardiovascular;  Laterality: N/A;   UPPER GASTROINTESTINAL ENDOSCOPY  01/11/2011   esophagitis    Family History  Problem Relation Age of Onset   Breast cancer Mother 51   Heart disease Mother        died CHF 32   Diabetes Mother    Prostate cancer Father    Hyperlipidemia Father    Heart disease Father        s/p cabg - alive   Colon cancer Other        First cousin on dads  side    Thyroid disease Maternal Grandmother     No Known Allergies  Current Outpatient Medications on File Prior to Visit  Medication Sig Dispense Refill   acetaminophen (TYLENOL) 500 MG tablet Take 500 mg by mouth every 6 (six) hours as needed. For pain     albuterol (VENTOLIN HFA) 108 (90 Base) MCG/ACT inhaler TAKE 2 PUFFS BY MOUTH EVERY 6 HOURS AS NEEDED 8.5 each 0   aspirin 81 MG tablet Take 81 mg by mouth daily.     atorvastatin (LIPITOR) 20 MG tablet Take 1 tablet (20 mg total) by  mouth daily. Pt.need an appt. In order to receive future refills. 3rd and Final Attempt. 15 tablet 0   Cholecalciferol (VITAMIN D) 2000 units CAPS Take 2000 units once per day. 30 capsule    Cyanocobalamin (VITAMIN B-12 PO) Take by mouth.     diltiazem (TIADYLT ER) 300 MG 24 hr capsule Take 1 capsule (300 mg total) by mouth daily. We won't be able to give pt a 90 days until he schedule appt with provider - 2nd attempt 15 capsule 0   DULoxetine (CYMBALTA) 60 MG capsule Take 1 capsule (60 mg total) by mouth daily. For anxiety, depression, pain 90 capsule 3   fluticasone (FLONASE) 50 MCG/ACT nasal spray Place 2 sprays into both nostrils daily. (Patient taking differently: Place 2 sprays into both nostrils daily as needed for allergies.) 16 g 6   isosorbide dinitrate (ISORDIL) 30 MG tablet Take 1 tablet (30 mg total) by mouth 3 (three) times daily. Please call [phone] to schedule an appointment for future refills. Thank you. 1st attempt. 90 tablet 0   levocetirizine (XYZAL) 5 MG tablet Take 1 tablet (5 mg total) by mouth every evening. For allergies 90 tablet 3   levothyroxine (SYNTHROID) 50 MCG tablet TAKE 1 TABLET BY MOUTH EVERY MORNING ON EMPTY STOMACH WITH WATER NO FOOD OR OTHER MEDS FOR 90 tablet 2   meloxicam (MOBIC) 15 MG tablet TAKE 1 TABLET (15 MG TOTAL) BY MOUTH DAILY. 30 tablet 3   nitroGLYCERIN (NITROSTAT) 0.4 MG SL tablet PLACE 1 TABLET (0.4 MG TOTAL) UNDER THE TONGUE EVERY 5 MINUTES AS  NEEDED FOR CHEST PAIN. X 3 DOSES.  Please call [phone] to schedule an appointment for future refills. Thank you. 75 tablet 0   Polyethyl Glycol-Propyl Glycol (SYSTANE) 0.4-0.3 % SOLN Place 1 drop into both eyes in the morning, at noon, in the evening, and at bedtime.     diclofenac Sodium (VOLTAREN) 1 % GEL Apply 2-4 g topically 4 (four) times daily. (Patient not taking: Reported on 05/26/2022) 200 g 2   No current facility-administered medications on file prior to visit.    BP 128/66   Pulse 80   Temp (!) 97.2 F (36.2 C) (Temporal)   Ht 5\' 4"  (1.626 m)   Wt 178 lb (80.7 kg)   LMP 07/28/2010   SpO2 97%   BMI 30.55 kg/m  Objective:   Physical Exam Constitutional:      General: She is not in acute distress. Cardiovascular:     Rate and Rhythm: Normal rate.     Comments: Numerous spider veins noted to bilateral lower extremities, specifically around bilateral ankles.  No edema. Pulmonary:     Effort: Pulmonary effort is normal.  Musculoskeletal:     Cervical back: Neck supple.  Skin:    General: Skin is warm and dry.  Psychiatric:        Mood and Affect: Mood normal.           Assessment & Plan:  Snoring Assessment & Plan: New referral placed for pulmonology for sleep study.  Orders: -     Ambulatory referral to Pulmonology  Varicose veins of both lower extremities with pain Assessment & Plan: Referral placed to vascular services per patient request.  Orders: -     Ambulatory referral to Vascular Surgery        Doreene Nest, NP

## 2023-02-20 ENCOUNTER — Other Ambulatory Visit: Payer: Self-pay | Admitting: Cardiovascular Disease

## 2023-02-22 DIAGNOSIS — R600 Localized edema: Secondary | ICD-10-CM | POA: Diagnosis not present

## 2023-02-22 DIAGNOSIS — I83893 Varicose veins of bilateral lower extremities with other complications: Secondary | ICD-10-CM | POA: Diagnosis not present

## 2023-02-22 DIAGNOSIS — I872 Venous insufficiency (chronic) (peripheral): Secondary | ICD-10-CM | POA: Diagnosis not present

## 2023-02-22 DIAGNOSIS — M79662 Pain in left lower leg: Secondary | ICD-10-CM | POA: Diagnosis not present

## 2023-02-22 DIAGNOSIS — M79661 Pain in right lower leg: Secondary | ICD-10-CM | POA: Diagnosis not present

## 2023-02-25 ENCOUNTER — Other Ambulatory Visit: Payer: Self-pay | Admitting: Cardiovascular Disease

## 2023-03-04 ENCOUNTER — Other Ambulatory Visit: Payer: Self-pay

## 2023-03-04 MED ORDER — ISOSORBIDE DINITRATE 30 MG PO TABS: 30.0000 mg | ORAL_TABLET | Freq: Three times a day (TID) | ORAL | 0 refills | Status: DC

## 2023-03-07 ENCOUNTER — Other Ambulatory Visit: Payer: Self-pay | Admitting: Cardiovascular Disease

## 2023-03-23 ENCOUNTER — Other Ambulatory Visit: Payer: Self-pay | Admitting: Primary Care

## 2023-03-23 DIAGNOSIS — J45909 Unspecified asthma, uncomplicated: Secondary | ICD-10-CM

## 2023-03-31 ENCOUNTER — Other Ambulatory Visit: Payer: Self-pay | Admitting: Cardiovascular Disease

## 2023-04-04 ENCOUNTER — Other Ambulatory Visit: Payer: Self-pay | Admitting: Cardiovascular Disease

## 2023-04-14 ENCOUNTER — Ambulatory Visit: Payer: BC Managed Care – PPO | Admitting: Internal Medicine

## 2023-04-14 ENCOUNTER — Encounter: Payer: Self-pay | Admitting: Internal Medicine

## 2023-04-14 VITALS — BP 128/80 | HR 94 | Temp 98.1°F | Ht 64.0 in | Wt 181.8 lb

## 2023-04-14 DIAGNOSIS — G4719 Other hypersomnia: Secondary | ICD-10-CM

## 2023-04-14 NOTE — Patient Instructions (Signed)
Recommend home sleep study to assess for sleep apnea  Recommend weight loss  Recommend lung cancer screening program referral

## 2023-04-14 NOTE — Progress Notes (Signed)
Name: Caroline [name] MRN: [id number] DOB: 1961/05/04    CHIEF COMPLAINT:  EXCESSIVE DAYTIME SLEEPINESS   HISTORY OF PRESENT ILLNESS: Patient is seen today for problems and issues with sleep related to excessive daytime sleepiness Patient  has been having sleep problems for many years Patient has been having excessive daytime sleepiness for a long time Patient has been having extreme fatigue and tiredness, lack of energy +  very Loud snoring every night + struggling breathe at night and gasps for air + Nonrefreshing sleep   Encouraged proper weight management.  Discussed driving precautions and its relationship with hypersomnolence.  Discussed operating dangerous equipment and its relationship with hypersomnolence.  Discussed sleep hygiene, and benefits of a fixed sleep waked time.  The importance of getting eight or more hours of sleep discussed with patient.  Discussed limiting the use of the computer and television before bedtime.  Decrease naps during the day, so night time sleep will become enhanced.  Limit caffeine, and sleep deprivation.  HTN, stroke, and heart failure are potential risk factors.    EPWORTH SLEEP SCORE 16  Former smoker quit 2015 1 pack a day for 30 years Patient could be a candidate for lung cancer screening protocol Patient uses albuterol as needed Patient does not have any maintenance inhaler therapy   Patient has occasional scratchy throat but resolves with water Wakes up with a dry mouth resolved with water No pain or odynophagia, dysphagia  PAST MEDICAL HISTORY :   has a past medical history of Adnexal mass (02/21/2020), CAD (coronary artery disease), Closed fracture of right humerus with routine healing (02/06/2013), Coronary vasospasm (HCC), Environmental and seasonal allergies, Esophagitis, GERD (gastroesophageal reflux disease), Hyperlipidemia, Hypertension, Hypothyroidism, IBS (irritable bowel syndrome), Palpitations, and Right ovarian  cyst.  has a past surgical history that includes Appendectomy (1976); Upper gastrointestinal endoscopy (01/11/2011); left heart catheterization with coronary angiogram (N/A, 09/14/2011); Breast excisional biopsy (Right); Cardiac catheterization (2011); Cardiac catheterization (05/2011); Breast surgery (Right, 2002); Cholecystectomy (1982); Laparoscopic salpingo oophorectomy (Right, 09/18/2020); LEFT HEART CATH AND CORONARY ANGIOGRAPHY (N/A, 10/08/2021); and Intracapsular cataract extraction (Right). Prior to Admission medications   Medication Sig Start Date End Date Taking? Authorizing Provider  acetaminophen (TYLENOL) 500 MG tablet Take 500 mg by mouth every 6 (six) hours as needed. For pain    [provider]  albuterol (VENTOLIN HFA) 108 (90 Base) MCG/ACT inhaler INHALE 2 PUFFS BY MOUTH EVERY 6 HOURS AS NEEDED 03/23/23   Doreene Nest, NP  aspirin 81 MG tablet Take 81 mg by mouth daily.    [provider]  atorvastatin (LIPITOR) 20 MG tablet TAKE 1 TABLET (20 MG TOTAL) BY MOUTH DAILY. PT.NEED AN APPT. IN ORDER TO RECEIVE FUTURE REFILLS 03/31/23   Kathleene Hazel, MD  Cholecalciferol (VITAMIN D) 2000 units CAPS Take 2000 units once per day. 03/18/16   Burchette, Elberta Fortis, MD  Cyanocobalamin (VITAMIN B-12 PO) Take by mouth.    [provider]  diclofenac Sodium (VOLTAREN) 1 % GEL Apply 2-4 g topically 4 (four) times daily. Patient not taking: Reported on 05/26/2022 02/14/20   Hilts, Casimiro Needle, MD  diltiazem (TIADYLT ER) 300 MG 24 hr capsule TAKE 1 CAPSULE (300 MG TOTAL) BY MOUTH DAILY. WE WON'T BE ABLE TO GIVE PT A 90 DAYS UNTIL HE SCHEDULE APPT WITH PROVIDER - 2ND ATTEMPT 03/07/23   Kathleene Hazel, MD  DULoxetine (CYMBALTA) 60 MG capsule Take 1 capsule (60 mg total) by mouth daily. For anxiety, depression, pain 10/21/22   Chestine Spore,  Keane Scrape, NP  fluticasone (FLONASE) 50 MCG/ACT nasal spray Place 2 sprays into both nostrils daily. Patient taking differently: Place  2 sprays into both nostrils daily as needed for allergies. 04/07/18   Emi Belfast, FNP  isosorbide dinitrate (ISORDIL) 30 MG tablet TAKE 1 TABLET BY MOUTH THREE TIMES A DAY NEED OFFICE VISIT 04/04/23   Kathleene Hazel, MD  levocetirizine (XYZAL) 5 MG tablet Take 1 tablet (5 mg total) by mouth every evening. For allergies 10/21/22   Doreene Nest, NP  levothyroxine (SYNTHROID) 50 MCG tablet TAKE 1 TABLET BY MOUTH EVERY MORNING ON EMPTY STOMACH WITH WATER NO FOOD OR OTHER MEDS FOR 12/19/22   Doreene Nest, NP  meloxicam (MOBIC) 15 MG tablet TAKE 1 TABLET (15 MG TOTAL) BY MOUTH DAILY. 11/18/22   Kathryne Hitch, MD  nitroGLYCERIN (NITROSTAT) 0.4 MG SL tablet PLACE 1 TABLET (0.4 MG TOTAL) UNDER THE TONGUE EVERY 5 MINUTES AS NEEDED FOR CHEST PAIN. X 3 DOSES.  Please call [phone] to schedule an appointment for future refills. Thank you. 08/12/22   Kathleene Hazel, MD  Polyethyl Glycol-Propyl Glycol (SYSTANE) 0.4-0.3 % SOLN Place 1 drop into both eyes in the morning, at noon, in the evening, and at bedtime.    [provider]   No Known Allergies  FAMILY HISTORY:  family history includes Breast cancer (age of onset: 65) in her mother; Colon cancer in an other family member; Diabetes in her mother; Heart disease in her father and mother; Hyperlipidemia in her father; Prostate cancer in her father; Thyroid disease in her maternal grandmother. SOCIAL HISTORY:  reports that she quit smoking about 8 years ago. Her smoking use included cigarettes. She started smoking about 38 years ago. She has a 7.5 pack-year smoking history. She has never used smokeless tobacco. She reports current alcohol use of about 2.0 standard drinks of alcohol per week. She reports that she does not use drugs.   Review of Systems:  Gen:  Denies  fever, sweats, chills weight loss  HEENT: Denies blurred vision, double vision, ear pain, eye pain, hearing loss, nose bleeds, sore  throat Cardiac:  No dizziness, chest pain or heaviness, chest tightness,edema, No JVD Resp:   No cough, -sputum production, -shortness of breath,-wheezing, -hemoptysis,  Gi: Denies swallowing difficulty, stomach pain, nausea or vomiting, diarrhea, constipation, bowel incontinence Gu:  Denies bladder incontinence, burning urine Ext:   Denies Joint pain, stiffness or swelling Skin: Denies  skin rash, easy bruising or bleeding or hives Endoc:  Denies polyuria, polydipsia , polyphagia or weight change Psych:   Denies depression, insomnia or hallucinations  Other:  All other systems negative   ALL OTHER ROS ARE NEGATIVE  BP 128/80 (BP Location: Left Arm, Cuff Size: Normal)   Pulse 94   Temp 98.1 F (36.7 C) (Temporal)   Ht 5\' 4"  (1.626 m)   Wt 181 lb 12.8 oz (82.5 kg)   LMP 07/28/2010   SpO2 96%   BMI 31.21 kg/m     Physical Examination:   General Appearance: No distress  EYES PERRLA, EOM intact.   NECK Supple, No JVD Pulmonary: normal breath sounds, No wheezing.  CardiovascularNormal S1,S2.  No m/r/g.   Abdomen: Benign, Soft, non-tender. Skin:   warm, no rashes, no ecchymosis  Extremities: normal, no cyanosis, clubbing. Neuro:without focal findings,  speech normal  PSYCHIATRIC: Mood, affect within normal limits.   ALL OTHER ROS ARE NEGATIVE    ASSESSMENT AND PLAN SYNOPSIS  Patient with  signs and symptoms of excessive daytime sleepiness with probable underlying diagnosis of obstructive sleep apnea in the setting of obesity and deconditioned state   Recommend Sleep Study for definitve diagnosis   Obesity -recommend significant weight loss -recommend changing diet  Deconditioned state -Recommend increased daily activity and exercise  Former smoker 1 pack a day for 30 years Quit 2015 Lung cancer screening referral program  MEDICATION ADJUSTMENTS/LABS AND TESTS ORDERED: Recommend Sleep Study Recommend weight loss Lung cancer screening referral  program  CURRENT MEDICATIONS REVIEWED AT LENGTH WITH PATIENT TODAY   Patient  satisfied with Plan of action and management. All questions answered  Follow up  3 months  Total Time Spent  35 mins   Wallis Bamberg Santiago Glad, M.D.  [facility]  Medical Director [facility] Medical Director [facility] Cardio-Pulmonary Department

## 2023-04-21 ENCOUNTER — Other Ambulatory Visit: Payer: Self-pay | Admitting: Cardiovascular Disease

## 2023-04-21 MED ORDER — DILTIAZEM HCL ER BEADS 300 MG PO CP24: 300.0000 mg | ORAL_CAPSULE | Freq: Every day | ORAL | 0 refills | Status: DC

## 2023-04-22 ENCOUNTER — Other Ambulatory Visit: Payer: Self-pay | Admitting: Primary Care

## 2023-04-22 DIAGNOSIS — J45909 Unspecified asthma, uncomplicated: Secondary | ICD-10-CM

## 2023-05-01 ENCOUNTER — Other Ambulatory Visit: Payer: Self-pay | Admitting: Cardiovascular Disease

## 2023-05-02 DIAGNOSIS — G4730 Sleep apnea, unspecified: Secondary | ICD-10-CM | POA: Diagnosis not present

## 2023-05-04 ENCOUNTER — Ambulatory Visit: Payer: BC Managed Care – PPO | Admitting: Physician Assistant

## 2023-05-08 NOTE — Progress Notes (Unsigned)
Cardiology Office Note    Patient Name: Caroline [name] Date of Encounter: 05/08/2023  Primary Care Provider:  Doreene Nest, NP Primary Cardiologist:  Verne Carrow, MD Primary Electrophysiologist: None   Past Medical History    Past Medical History:  Diagnosis Date   Adnexal mass 02/21/2020   CAD (coronary artery disease)    has documented coronary vasospasm but with underlying 40% prox LAD, 30% prox LCX and 70 to 80% proximal 1st DX and 30% RCA; recurrent vasospasm with subsequent cath in Jan 2013.    Closed fracture of right humerus with routine healing 02/06/2013   Coronary vasospasm (HCC)    Environmental and seasonal allergies    Esophagitis    GERD (gastroesophageal reflux disease)    Hyperlipidemia    Hypertension    Hypothyroidism    IBS (irritable bowel syndrome)    Palpitations    tachycardia on holter; treated with beta blockers   Right ovarian cyst     History of Present Illness  Shaneisha Kaupp is a 62 y.o. female with a PMH of CAD, GERD, HLD, Prinzmetal angina, HTN, palpitations who presents today for 1 year follow-up.  Caroline [name] was seen initially by Dr. Patty Sermons and is currently followed by Dr. Clifton James for management of CAD.  She has a history significant for Prinzmetal angina that is controlled currently with CCB.  She was last seen by Dr. Clifton James on 09/29/2021 with complaint of exertional chest pain.  She had been seen in the ED previously and had negative ACS workup.  She underwent a LHC last on 10/08/2021 that showed mild nonobstructive disease in the proximal LAD and mid circumflex with 20% proximal RCA stenosis.  She was seen last 10/20/2021 for post PCI follow-up and patient endorsed no further chest pain at that time.  She was continue to stay active and no further med changes were made at that time.  During today's visit the patient reports doing well with no new cardiac complaints since her previous follow-up.  Her blood pressure today was  initially elevated at 148/78 and was 124/68 on recheck.  She has compliant with her current medications and denies any adverse reactions.  During today's visit she reported complaints of fatigue and wanted to discuss possible side effects of her medications.  We discussed and reviewed her medications and she is currently in favor of waiting until her sleep study is complete before making medication changes. Patient denies chest pain, palpitations, dyspnea, PND, orthopnea, nausea, vomiting, dizziness, syncope, edema, weight gain, or early satiety.    Review of Systems  Please see the history of present illness.    All other systems reviewed and are otherwise negative except as noted above.  Physical Exam    Wt Readings from Last 3 Encounters:  04/14/23 181 lb 12.8 oz (82.5 kg)  02/16/23 178 lb (80.7 kg)  10/21/22 176 lb (79.8 kg)   WU:JWJXB were no vitals filed for this visit.,There is no height or weight on file to calculate BMI. GEN: Well nourished, well developed in no acute distress Neck: No JVD; No carotid bruits Pulmonary: Clear to auscultation without rales, wheezing or rhonchi  Cardiovascular: Normal rate. Regular rhythm. Normal S1. Normal S2.   Murmurs: There is no murmur.  ABDOMEN: Soft, non-tender, non-distended EXTREMITIES:  No edema; No deformity   EKG/LABS/ Recent Cardiac Studies   ECG personally reviewed by me today -sinus rhythm with TWI in lead aVL and rate of 81 bpm consistent with previous EKG with no acute  changes.  Risk Assessment/Calculations:          Lab Results  Component Value Date   WBC 6.3 10/16/2021   HGB 14.6 10/16/2021   HCT 44.7 10/16/2021   MCV 99.3 10/16/2021   PLT 189.0 10/16/2021   Lab Results  Component Value Date   CREATININE 0.65 10/21/2022   BUN 13 10/21/2022   NA 139 10/21/2022   K 4.6 10/21/2022   CL 101 10/21/2022   CO2 29 10/21/2022   Lab Results  Component Value Date   CHOL 169 10/21/2022   HDL 73.10 10/21/2022   LDLCALC  83 10/21/2022   TRIG 68.0 10/21/2022   CHOLHDL 2 10/21/2022    Lab Results  Component Value Date   HGBA1C 6.1 10/21/2022   Assessment & Plan    1.  Nonobstructive CAD: -s/p LHC completed 08/2021 showing mild nonobstructive CAD -Today patient reports no chest discomfort or anginal equivalent. -Continue current GDMT with Isordil 30 mg 3 times daily, Lipitor 20 mg daily, ASA 81 mg as needed Nitrostat 0.4 mg, and Cardizem 300 mg daily  2.  Hyperlipidemia: -Patient's last LDL cholesterol was 83 -Continue Lipitor 20 mg daily  3.  Prinzmetal angina: -Today patient reports no chest pain or anginal equivalent since previous visit -Continue current antianginal medications as noted above  4.  Fatigue/daytime somnolence: -Patient reports ongoing fatigue and has recently been screened and evaluated for sleep apnea with sleep study results pending -Patient is in favor not making changes to her current medication regimen until after sleep study results are available.   Disposition: Follow-up with Verne Carrow, MD or APP in 12 months   Signed, Napoleon Form, Leodis Rains, NP 05/08/2023, 4:21 PM [facility]

## 2023-05-09 ENCOUNTER — Ambulatory Visit: Payer: BC Managed Care – PPO | Attending: Physician Assistant | Admitting: Nurse Practitioner

## 2023-05-09 ENCOUNTER — Encounter: Payer: Self-pay | Admitting: Nurse Practitioner

## 2023-05-09 VITALS — BP 124/68 | HR 88 | Ht 64.0 in | Wt 181.4 lb

## 2023-05-09 DIAGNOSIS — R5382 Chronic fatigue, unspecified: Secondary | ICD-10-CM | POA: Diagnosis not present

## 2023-05-09 DIAGNOSIS — R400 Somnolence: Secondary | ICD-10-CM | POA: Diagnosis not present

## 2023-05-09 DIAGNOSIS — I25111 Atherosclerotic heart disease of native coronary artery with angina pectoris with documented spasm: Secondary | ICD-10-CM

## 2023-05-09 DIAGNOSIS — I25110 Atherosclerotic heart disease of native coronary artery with unstable angina pectoris: Secondary | ICD-10-CM

## 2023-05-09 DIAGNOSIS — E7800 Pure hypercholesterolemia, unspecified: Secondary | ICD-10-CM | POA: Diagnosis not present

## 2023-05-09 DIAGNOSIS — I201 Angina pectoris with documented spasm: Secondary | ICD-10-CM

## 2023-05-09 MED ORDER — ATORVASTATIN CALCIUM 20 MG PO TABS: 20.0000 mg | ORAL_TABLET | Freq: Every day | ORAL | 3 refills | Status: DC

## 2023-05-09 NOTE — Patient Instructions (Addendum)
Medication Instructions:  Your physician recommends that you continue on your current medications as directed. Please refer to the Current Medication list given to you today. *If you need a refill on your cardiac medications before your next appointment, please call your pharmacy*   Lab Work: None ordered   Testing/Procedures: None ordered   Follow-Up: At [facility], you and your health needs are our priority.  As part of our continuing mission to provide you with exceptional heart care, we have created designated Provider Care Teams.  These Care Teams include your primary Cardiologist (physician) and Advanced Practice Providers (APPs -  Physician Assistants and Nurse Practitioners) who all work together to provide you with the care you need, when you need it.  We recommend signing up for the patient portal called "MyChart".  Sign up information is provided on this After Visit Summary.  MyChart is used to connect with patients for Virtual Visits (Telemedicine).  Patients are able to view lab/test results, encounter notes, upcoming appointments, etc.  Non-urgent messages can be sent to your provider as well.   To learn more about what you can do with MyChart, go to [email or web address].    Your next appointment:   12 month(s)  Provider:   Verne Carrow, MD     Other Instructions

## 2023-05-17 ENCOUNTER — Encounter (INDEPENDENT_AMBULATORY_CARE_PROVIDER_SITE_OTHER): Payer: BC Managed Care – PPO

## 2023-05-17 DIAGNOSIS — G4733 Obstructive sleep apnea (adult) (pediatric): Secondary | ICD-10-CM

## 2023-05-17 DIAGNOSIS — G4719 Other hypersomnia: Secondary | ICD-10-CM

## 2023-05-18 ENCOUNTER — Other Ambulatory Visit: Payer: Self-pay | Admitting: Cardiovascular Disease

## 2023-05-30 ENCOUNTER — Other Ambulatory Visit: Payer: Self-pay | Admitting: Primary Care

## 2023-05-30 DIAGNOSIS — J45909 Unspecified asthma, uncomplicated: Secondary | ICD-10-CM

## 2023-05-30 MED ORDER — ALBUTEROL SULFATE HFA 108 (90 BASE) MCG/ACT IN AERS
2.0000 | INHALATION_SPRAY | Freq: Four times a day (QID) | RESPIRATORY_TRACT | 0 refills | Status: DC | PRN
Start: 2023-05-30 — End: 2023-08-18

## 2023-06-21 ENCOUNTER — Other Ambulatory Visit: Payer: Self-pay | Admitting: Primary Care

## 2023-06-21 DIAGNOSIS — J45909 Unspecified asthma, uncomplicated: Secondary | ICD-10-CM

## 2023-06-21 NOTE — Telephone Encounter (Signed)
Please call patient:  Received refill request for albuterol inhaler. Does she actually need a refill or is this on autofill?  If she needs it, how often is she using it? We just refilled it one month ago.

## 2023-06-21 NOTE — Telephone Encounter (Signed)
Called patient and reviewed all information. Patient verbalized understanding.  Patient does not need a refill right now, this was on auto fill.  Will call if any further questions.

## 2023-06-22 NOTE — Telephone Encounter (Signed)
Noted  

## 2023-07-01 ENCOUNTER — Telehealth: Payer: BC Managed Care – PPO | Admitting: Primary Care

## 2023-07-01 ENCOUNTER — Encounter: Payer: Self-pay | Admitting: Primary Care

## 2023-07-01 DIAGNOSIS — G4733 Obstructive sleep apnea (adult) (pediatric): Secondary | ICD-10-CM | POA: Diagnosis not present

## 2023-07-01 NOTE — Progress Notes (Signed)
Virtual Visit via Video Note  I connected with Caroline [name] on 07/01/23 at 11:00 AM EDT by a video enabled telemedicine application and verified that I am speaking with the correct person using two identifiers.  Location: Patient: Home Provider: Office    I discussed the limitations of evaluation and management by telemedicine and the availability of in person appointments. The patient expressed understanding and agreed to proceed.  Discussed the use of AI scribe software for clinical note transcription with the patient, who gave verbal consent to proceed.  History of Present Illness: 07/01/2023 Caroline [name] is a 62 year old former smoker who quit in 2015. Past medical history includes hypertension, coronary artery disease, intrinsic asthma, hypothyroidism, dyslipidemia, fibromyalgia, and prediabetes. Initially presented in August 2024 for a sleep consult with Dr. Belia Heman due to excessive daytime sleepiness. A home sleep study conducted in September 2024 revealed mild obstructive sleep apnea, with an average apnea-hypopnea index score of 5.4 per hour and a lowest SpO2 of 79%. The patient spent 116 minutes of the total test time with SpO2 less than 88%, suggestive of sleep-related hypoxia/hypoventilation.   Caroline [name] reported experiencing snoring and sinus issues, which she attributed to sleeping with her mouth open. She denied waking up gasping or choking at night. Despite the mild nature of the sleep apnea, the patient's oxygen levels dropped significantly during sleep, leading to nearly two hours of sleep with an oxygen level less than 88%. This, coupled with the patient's daytime fatigue and non-restorative sleep, led to the recommendation of a CPAP machine for treatment.  However, Caroline [name] expressed significant concerns about claustrophobia and the inability to tolerate anything covering her face, particularly her nose and mouth. Despite these concerns, she was open to trying a nasal mask for the CPAP  machine, with the understanding that if this was not successful, an oral appliance could be considered as an alternative treatment.      Observations/Objective:  Appears well; No overt respiratory symptoms   Assessment and Plan:   1. OSA (obstructive sleep apnea) - AMB REFERRAL FOR DME  Mild Obstructive Sleep Apnea Diagnosed via home sleep study with an average hypopnea index score of 5.4 per hour and SpO2 low of 79%. Patient spent 116 minutes of total test time with SpO2 less than 88%, suggestive of sleep-related hypoxia/hypoventilation. Patient reports snoring and excessive daytime sleepiness. Discussed the risks of untreated sleep apnea including cardiac arrhythmias, stroke, diabetes, pulmonary hypertension, and decreased quality of life. -Recommend trial of CPAP therapy auto settings 5-15cm h20 with a nasal mask due to the patient's claustrophobia. -Encourage patient to gradually acclimate to the mask by wearing it during the day while watching TV, initially without the machine turned on, then with the machine on. -Order CPAP machine and arrange for patient to pick it up from a local medical supply store. -Plan to follow up in 31-90 days to assess compliance and effectiveness of therapy. -If CPAP therapy is not tolerated, consider referral for an oral appliance fitted by an orthodontic dentist.      Follow Up Instructions:  31-90 days with Waynetta Sandy NP for CPAP compliance    I discussed the assessment and treatment plan with the patient. The patient was provided an opportunity to ask questions and all were answered. The patient agreed with the plan and demonstrated an understanding of the instructions.   The patient was advised to call back or seek an in-person evaluation if the symptoms worsen or if the condition fails to improve as anticipated.  I provided  22 minutes of non-face-to-face time during this encounter.   Glenford Bayley, NP

## 2023-07-21 DIAGNOSIS — G4733 Obstructive sleep apnea (adult) (pediatric): Secondary | ICD-10-CM | POA: Diagnosis not present

## 2023-08-03 ENCOUNTER — Telehealth: Payer: Self-pay | Admitting: Primary Care

## 2023-08-03 DIAGNOSIS — G4733 Obstructive sleep apnea (adult) (pediatric): Secondary | ICD-10-CM

## 2023-08-03 NOTE — Telephone Encounter (Signed)
Patient has started using her cpap machine and is experiencing teeth sensitivity. She was told by her DME company that it might be from the pressure setting. Please call and advise on what she should do about adjustments. [phone]

## 2023-08-03 NOTE — Telephone Encounter (Signed)
Dr. Belia Heman approved CPAP adjustment. DME order placed.  Patient scheduled for fu on 08/10/23 10:30 am.

## 2023-08-03 NOTE — Telephone Encounter (Signed)
Patient reports she is having teeth pain/sensitivity after wearing CPAP machine. She denies any other symptoms. No fever, gum swelling or bleeding. DME: [employer]

## 2023-08-10 ENCOUNTER — Encounter: Payer: Self-pay | Admitting: Internal Medicine

## 2023-08-10 ENCOUNTER — Ambulatory Visit: Payer: BC Managed Care – PPO | Admitting: Internal Medicine

## 2023-08-10 VITALS — BP 136/76 | HR 98 | Temp 97.6°F | Ht 64.0 in | Wt 180.0 lb

## 2023-08-10 DIAGNOSIS — B349 Viral infection, unspecified: Secondary | ICD-10-CM | POA: Diagnosis not present

## 2023-08-10 DIAGNOSIS — J9801 Acute bronchospasm: Secondary | ICD-10-CM | POA: Diagnosis not present

## 2023-08-10 DIAGNOSIS — G4733 Obstructive sleep apnea (adult) (pediatric): Secondary | ICD-10-CM

## 2023-08-10 NOTE — Progress Notes (Signed)
Name: Caroline [name] MRN: [id number] DOB: [birth year]    CHIEF COMPLAINT:  Follow Up Assessment of Sleep Anea   HISTORY OF PRESENT ILLNESS: Follow up OSA assessment HST performed shows AHI 5.4 Patient had low O2 sats less than 88% for 116 minutes Patient started auto CPAP therapy at 5-15 however her teeth were hurting We decreased Auto CPAP 4-10 Compliance report shows significant improvement in compliance AHI is down to 1   Discussed sleep data and reviewed with patient.  Encouraged proper weight management.  Discussed driving precautions and its relationship with hypersomnolence.  Discussed operating dangerous equipment and its relationship with hypersomnolence.  Discussed sleep hygiene, and benefits of a fixed sleep waked time.  The importance of getting eight or more hours of sleep discussed with patient.  Discussed limiting the use of the computer and television before bedtime.  Decrease naps during the day, so night time sleep will become enhanced.  Limit caffeine, and sleep deprivation.  HTN, stroke, and heart failure are potential risk factors.   Former smoker quit 2015 1 pack a day for 30 years Patient uses albuterol as needed Patient does not have any maintenance inhaler therapy  At this time patient has fatigue and headaches Sinus congestion and drainage Concerning for acute viral bronchitis associated with intermittent wheezing and cough using albuterol more frequently over the last several days We will plan to test for COVID RSV flu and respiratory viral panel  Patient is planning to travel to [place] Recommend testing for viruses Also recommend taking CPAP with air Patient with nocturnal hypoxia explained to patient in detail  PAST MEDICAL HISTORY :   has a past medical history of Adnexal mass (02/21/2020), CAD (coronary artery disease), Closed fracture of right humerus with routine healing (02/06/2013), Coronary vasospasm (HCC), Environmental and  seasonal allergies, Esophagitis, GERD (gastroesophageal reflux disease), Hyperlipidemia, Hypertension, Hypothyroidism, IBS (irritable bowel syndrome), Palpitations, and Right ovarian cyst.  has a past surgical history that includes Appendectomy (1976); Upper gastrointestinal endoscopy (01/11/2011); left heart catheterization with coronary angiogram (N/A, 09/14/2011); Breast excisional biopsy (Right); Cardiac catheterization (2011); Cardiac catheterization (05/2011); Breast surgery (Right, 2002); Cholecystectomy (1982); Laparoscopic salpingo oophorectomy (Right, 09/18/2020); LEFT HEART CATH AND CORONARY ANGIOGRAPHY (N/A, 10/08/2021); and Intracapsular cataract extraction (Right). Prior to Admission medications   Medication Sig Start Date End Date Taking? Authorizing Provider  acetaminophen (TYLENOL) 500 MG tablet Take 500 mg by mouth every 6 (six) hours as needed. For pain    [provider]  albuterol (VENTOLIN HFA) 108 (90 Base) MCG/ACT inhaler Inhale 2 puffs into the lungs every 6 (six) hours as needed. 05/30/23   Doreene Nest, NP  aspirin 81 MG tablet Take 81 mg by mouth daily.    [provider]  atorvastatin (LIPITOR) 20 MG tablet Take 1 tablet (20 mg total) by mouth daily. 05/09/23   Gaston Islam., NP  Cholecalciferol (VITAMIN D) 2000 units CAPS Take 2000 units once per day. 03/18/16   Burchette, Elberta Fortis, MD  Cyanocobalamin (VITAMIN B-12 PO) Take by mouth.    [provider]  diclofenac Sodium (VOLTAREN) 1 % GEL Apply 2-4 g topically 4 (four) times daily. 02/14/20   Hilts, Casimiro Needle, MD  diltiazem (TIADYLT ER) 300 MG 24 hr capsule TAKE 1 CAPSULE BY MOUTH EVERY DAY 05/18/23   Kathleene Hazel, MD  DULoxetine (CYMBALTA) 60 MG capsule Take 1 capsule (60 mg total) by mouth daily. For anxiety, depression, pain 10/21/22   Doreene Nest, NP  fluticasone [facility]) 50  MCG/ACT nasal spray Place 2 sprays into both nostrils daily. Patient taking differently: Place 2  sprays into both nostrils daily as needed for allergies. 04/07/18   Emi Belfast, FNP  isosorbide dinitrate (ISORDIL) 30 MG tablet TAKE 1 TABLET BY MOUTH THREE TIMES A DAY NEED OFFICE VISIT 05/03/23   Kathleene Hazel, MD  levocetirizine (XYZAL) 5 MG tablet Take 1 tablet (5 mg total) by mouth every evening. For allergies 10/21/22   Doreene Nest, NP  levothyroxine (SYNTHROID) 50 MCG tablet TAKE 1 TABLET BY MOUTH EVERY MORNING ON EMPTY STOMACH WITH WATER NO FOOD OR OTHER MEDS FOR 12/19/22   Doreene Nest, NP  meloxicam (MOBIC) 15 MG tablet TAKE 1 TABLET (15 MG TOTAL) BY MOUTH DAILY. 11/18/22   Kathryne Hitch, MD  nitroGLYCERIN (NITROSTAT) 0.4 MG SL tablet PLACE 1 TABLET (0.4 MG TOTAL) UNDER THE TONGUE EVERY 5 MINUTES AS NEEDED FOR CHEST PAIN. X 3 DOSES.  Please call [phone] to schedule an appointment for future refills. Thank you. 08/12/22   Kathleene Hazel, MD  Polyethyl Glycol-Propyl Glycol (SYSTANE) 0.4-0.3 % SOLN Place 1 drop into both eyes in the morning, at noon, in the evening, and at bedtime.    [provider]   Allergies  Allergen Reactions   Octacosanol Itching    FAMILY HISTORY:  family history includes Breast cancer (age of onset: 93) in her mother; Colon cancer in an other family member; Diabetes in her mother; Heart disease in her father and mother; Hyperlipidemia in her father; Prostate cancer in her father; Thyroid disease in her maternal grandmother. SOCIAL HISTORY:  reports that she quit smoking about 9 years ago. Her smoking use included cigarettes. She started smoking about 39 years ago. She has a 7.5 pack-year smoking history. She has never used smokeless tobacco. She reports current alcohol use of about 2.0 standard drinks of alcohol per week. She reports that she does not use drugs.  BP 136/76 (BP Location: Right Arm, Patient Position: Sitting, Cuff Size: Normal)   Pulse 98   Temp 97.6 F (36.4 C) (Temporal)   Ht 5\' 4"   (1.626 m)   Wt 180 lb (81.6 kg)   LMP 07/28/2010   SpO2 96%   BMI 30.90 kg/m      Review of Systems: Gen:  Denies  fever, sweats, chills weight loss  HEENT: Denies blurred vision, double vision, ear pain, eye pain, hearing loss, nose bleeds, sore throat Sinus congestion and drainage Cardiac:  No dizziness, chest pain or heaviness, chest tightness,edema, No JVD Resp:   + cough, +sputum production, +shortness of breath,+wheezing, -hemoptysis,  Other:  All other systems negative   Physical Examination:   General Appearance: No distress  EYES PERRLA, EOM intact.   NECK Supple, No JVD Pulmonary: normal breath sounds, No wheezing.  CardiovascularNormal S1,S2.  No m/r/g.   Abdomen: Benign, Soft, non-tender. Neurology UE/LE 5/5 strength, no focal deficits Ext pulses intact, cap refill intact ALL OTHER ROS ARE NEGATIVE    ASSESSMENT AND PLAN SYNOPSIS  62 year old white female seen today for follow-up assessment for OSA  Home sleep study recently obtained shows AHI of 5.4, patient with nocturnal hypoxia with 116 mins lower than 88% O2 sat levels, now with signs and symptoms of acute viral infection and bronchitis  Acute viral infection and bronchitis Check COVID Check flu check RSV Check respiratory viral panel Albuterol as needed Tylenol Motrin as needed No indication for prednisone antibiotics at this time No wheezing at this  time Recommend restarting Flonase   OSA assessment Nocturnal hypoxia Excellent compliance report Auto CPAP changed to 4-10 Patient tolerating better No evidence of dental pain Patient wears nasal mask AHI reduced to 1 Patient uses and benefits from therapy    Obesity -recommend significant weight loss -recommend changing diet  Deconditioned state -Recommend increased daily activity and exercise   MEDICATION ADJUSTMENTS/LABS AND TESTS ORDERED: Continue CPAP as prescribed Check for COVID Check for flu Check for RSV   Continue  albuterol as needed Can use Tylenol and Motrin as needed Recommend restarting Flonase Can use over-the-counter Mucinex to help with cough  Avoid secondhand smoke Avoid SICK contacts Recommend  Masking  when appropriate Recommend Keep up-to-date with vaccinations   CURRENT MEDICATIONS REVIEWED AT LENGTH WITH PATIENT TODAY   Patient  satisfied with Plan of action and management. All questions answered  Follow up  1 year  Total Time Spent  42 mins   Laina Guerrieri Santiago Glad, M.D.  [facility]  Medical Director [facility] Medical Director [facility] Cardio-Pulmonary Department

## 2023-08-10 NOTE — Patient Instructions (Signed)
Excellent job A+  Continue CPAP as prescribed  Check for COVID Check for flu Check for RSV   Continue albuterol as needed Can use Tylenol and Motrin as needed Recommend restarting Flonase Can use over-the-counter Mucinex to help with cough  Avoid secondhand smoke Avoid SICK contacts Recommend  Masking  when appropriate Recommend Keep up-to-date with vaccinations

## 2023-08-18 ENCOUNTER — Encounter: Payer: Self-pay | Admitting: Internal Medicine

## 2023-08-18 DIAGNOSIS — J45909 Unspecified asthma, uncomplicated: Secondary | ICD-10-CM

## 2023-08-18 MED ORDER — ALBUTEROL SULFATE HFA 108 (90 BASE) MCG/ACT IN AERS: 2.0000 | INHALATION_SPRAY | Freq: Four times a day (QID) | RESPIRATORY_TRACT | 11 refills | Status: AC | PRN

## 2023-08-19 ENCOUNTER — Other Ambulatory Visit: Payer: Self-pay | Admitting: Primary Care

## 2023-08-19 DIAGNOSIS — J45909 Unspecified asthma, uncomplicated: Secondary | ICD-10-CM

## 2023-08-20 DIAGNOSIS — G4733 Obstructive sleep apnea (adult) (pediatric): Secondary | ICD-10-CM | POA: Diagnosis not present

## 2023-09-04 ENCOUNTER — Other Ambulatory Visit: Payer: Self-pay | Admitting: Primary Care

## 2023-09-04 ENCOUNTER — Other Ambulatory Visit: Payer: Self-pay | Admitting: Cardiovascular Disease

## 2023-09-04 DIAGNOSIS — F419 Anxiety disorder, unspecified: Secondary | ICD-10-CM

## 2023-09-04 DIAGNOSIS — E039 Hypothyroidism, unspecified: Secondary | ICD-10-CM

## 2023-09-04 DIAGNOSIS — F32A Depression, unspecified: Secondary | ICD-10-CM

## 2023-09-04 DIAGNOSIS — M797 Fibromyalgia: Secondary | ICD-10-CM

## 2023-09-04 NOTE — Telephone Encounter (Signed)
Patient is due for CPE/follow up in late February, this will be required prior to any further refills.  Please schedule, thank you!

## 2023-09-05 NOTE — Telephone Encounter (Signed)
 Lvmtcb, sent mychart message

## 2023-09-20 DIAGNOSIS — G4733 Obstructive sleep apnea (adult) (pediatric): Secondary | ICD-10-CM | POA: Diagnosis not present

## 2023-10-11 ENCOUNTER — Telehealth: Payer: Self-pay

## 2023-10-11 ENCOUNTER — Encounter: Payer: Self-pay | Admitting: *Deleted

## 2023-10-11 DIAGNOSIS — Z122 Encounter for screening for malignant neoplasm of respiratory organs: Secondary | ICD-10-CM

## 2023-10-11 DIAGNOSIS — Z87891 Personal history of nicotine dependence: Secondary | ICD-10-CM

## 2023-10-11 NOTE — Telephone Encounter (Signed)
.  Lung Cancer Screening Narrative/Criteria Questionnaire (Cigarette Smokers Only- No Cigars/Pipes/vapes)   Jammi Stennis   SDMV:10/27/2023 at 10:30am with Vernona Rieger      06-02-1961   LDCT: 11/01/2023 at 1:00pm at [facility]    63 y.o.   Phone: [phone]  Lung Screening Narrative (confirm age 23-77 yrs Medicare / 50-80 yrs Private pay insurance)   Insurance information:BCBS   Referring Provider:Clark,NP  This screening involves an initial phone call with a team member from our program. It is called a shared decision making visit. The initial meeting is required by  insurance and Medicare to make sure you understand the program. This appointment takes about 15-20 minutes to complete. You will complete the screening scan at your scheduled date/time.  This scan takes about 5-10 minutes to complete. You can eat and drink normally before and after the scan.  Criteria questions for Lung Cancer Screening:   Are you a current or former smoker? Former Age began smoking: 14   If you are a former smoker, what year did you quit smoking? Quit 2023  (within 15 yrs)   To calculate your smoking history, I need an accurate estimate of how many packs of cigarettes you smoked per day and for how many years. (Not just the number of PPD you are now smoking)   Years smoking 46 x Packs per day .75 = Pack years 34.5   (at least 20 pack yrs)   (Make sure they understand that we need to know how much they have smoked in the past, not just the number of PPD they are smoking now)  Do you have a personal history of cancer?  No    Do you have a family history of cancer? Yes  (cancer type and and relative) Father lung and prostate   Are you coughing up blood?  No  Have you had unexplained weight loss of 15 lbs or more in the last 6 months? No  It looks like you meet all criteria.  When would be a good time for [place] to schedule you for this screening?   Additional information: N/A

## 2023-10-18 ENCOUNTER — Encounter: Payer: BC Managed Care – PPO | Admitting: Primary Care

## 2023-10-21 DIAGNOSIS — G4733 Obstructive sleep apnea (adult) (pediatric): Secondary | ICD-10-CM | POA: Diagnosis not present

## 2023-10-24 ENCOUNTER — Ambulatory Visit (INDEPENDENT_AMBULATORY_CARE_PROVIDER_SITE_OTHER): Payer: BC Managed Care – PPO | Admitting: Primary Care

## 2023-10-24 ENCOUNTER — Encounter: Payer: Self-pay | Admitting: Primary Care

## 2023-10-24 VITALS — BP 136/66 | HR 85 | Temp 97.5°F | Ht 64.0 in | Wt 182.0 lb

## 2023-10-24 DIAGNOSIS — I10 Essential (primary) hypertension: Secondary | ICD-10-CM | POA: Diagnosis not present

## 2023-10-24 DIAGNOSIS — M545 Low back pain, unspecified: Secondary | ICD-10-CM | POA: Diagnosis not present

## 2023-10-24 DIAGNOSIS — F419 Anxiety disorder, unspecified: Secondary | ICD-10-CM

## 2023-10-24 DIAGNOSIS — F32A Depression, unspecified: Secondary | ICD-10-CM

## 2023-10-24 DIAGNOSIS — G8911 Acute pain due to trauma: Secondary | ICD-10-CM | POA: Diagnosis not present

## 2023-10-24 DIAGNOSIS — I2510 Atherosclerotic heart disease of native coronary artery without angina pectoris: Secondary | ICD-10-CM

## 2023-10-24 DIAGNOSIS — G4733 Obstructive sleep apnea (adult) (pediatric): Secondary | ICD-10-CM

## 2023-10-24 DIAGNOSIS — J45909 Unspecified asthma, uncomplicated: Secondary | ICD-10-CM

## 2023-10-24 DIAGNOSIS — E785 Hyperlipidemia, unspecified: Secondary | ICD-10-CM

## 2023-10-24 DIAGNOSIS — E039 Hypothyroidism, unspecified: Secondary | ICD-10-CM | POA: Diagnosis not present

## 2023-10-24 DIAGNOSIS — Z0001 Encounter for general adult medical examination with abnormal findings: Secondary | ICD-10-CM | POA: Diagnosis not present

## 2023-10-24 DIAGNOSIS — J3089 Other allergic rhinitis: Secondary | ICD-10-CM

## 2023-10-24 DIAGNOSIS — M797 Fibromyalgia: Secondary | ICD-10-CM

## 2023-10-24 DIAGNOSIS — R7303 Prediabetes: Secondary | ICD-10-CM | POA: Diagnosis not present

## 2023-10-24 LAB — COMPREHENSIVE METABOLIC PANEL
ALT: 22 U/L (ref 0–35)
AST: 23 U/L (ref 0–37)
Albumin: 4.7 g/dL (ref 3.5–5.2)
Alkaline Phosphatase: 80 U/L (ref 39–117)
BUN: 14 mg/dL (ref 6–23)
CO2: 30 meq/L (ref 19–32)
Calcium: 9.3 mg/dL (ref 8.4–10.5)
Chloride: 100 meq/L (ref 96–112)
Creatinine, Ser: 0.67 mg/dL (ref 0.40–1.20)
GFR: 92.5 mL/min (ref >60.00)
Glucose, Bld: 116 mg/dL — ABNORMAL HIGH (ref 70–99)
Potassium: 4.9 meq/L (ref 3.5–5.1)
Sodium: 139 meq/L (ref 135–145)
Total Bilirubin: 0.5 mg/dL (ref 0.2–1.2)
Total Protein: 7.9 g/dL (ref 6.0–8.3)

## 2023-10-24 LAB — CBC
HCT: 45 % (ref 36.0–46.0)
Hemoglobin: 15.1 g/dL — ABNORMAL HIGH (ref 12.0–15.0)
MCHC: 33.7 g/dL (ref 30.0–36.0)
MCV: 99.8 fL (ref 78.0–100.0)
Platelets: 218 10*3/uL (ref 150.0–400.0)
RBC: 4.51 Mil/uL (ref 3.87–5.11)
RDW: 14.2 % (ref 11.5–15.5)
WBC: 6.5 10*3/uL (ref 4.0–10.5)

## 2023-10-24 LAB — LIPID PANEL
Cholesterol: 182 mg/dL (ref 0–200)
HDL: 95.8 mg/dL (ref >39.00)
LDL Cholesterol: 73 mg/dL (ref 0–99)
NonHDL: 85.75
Total CHOL/HDL Ratio: 2
Triglycerides: 65 mg/dL (ref 0.0–149.0)
VLDL: 13 mg/dL (ref 0.0–40.0)

## 2023-10-24 LAB — HEMOGLOBIN A1C: Hgb A1c MFr Bld: 6.2 % (ref 4.6–6.5)

## 2023-10-24 LAB — TSH: TSH: 2.33 u[IU]/mL (ref 0.35–5.50)

## 2023-10-24 MED ORDER — PREDNISONE 20 MG PO TABS: ORAL_TABLET | ORAL | 0 refills | Status: DC

## 2023-10-24 MED ORDER — CYCLOBENZAPRINE HCL 5 MG PO TABS: 5.0000 mg | ORAL_TABLET | Freq: Three times a day (TID) | ORAL | 0 refills | Status: DC | PRN

## 2023-10-24 MED ORDER — KETOROLAC TROMETHAMINE 60 MG/2ML IM SOLN
60.0000 mg | Freq: Once | INTRAMUSCULAR | Status: AC
  Administered 2023-10-24: 60 mg via INTRAMUSCULAR

## 2023-10-24 NOTE — Assessment & Plan Note (Signed)
 Controlled.  Continue albuterol inhaler as needed.

## 2023-10-24 NOTE — Assessment & Plan Note (Signed)
Controlled.  Continue Cymbalta 60 mg daily. 

## 2023-10-24 NOTE — Assessment & Plan Note (Signed)
 Repeat A1c pending

## 2023-10-24 NOTE — Assessment & Plan Note (Signed)
 Asymptomatic.   Following with cardiology, office notes reviewed from September 2024. Continue lipid control. Continue aspirin 81 mg daily.

## 2023-10-24 NOTE — Addendum Note (Signed)
 Addended by: Lonia Blood on: 10/24/2023 10:36 AM   Modules accepted: Orders

## 2023-10-24 NOTE — Assessment & Plan Note (Signed)
 Repeat lipid panel pending. Continue atorvastatin 20 mg daily.

## 2023-10-24 NOTE — Assessment & Plan Note (Signed)
 Acute on chronic flare with recent fall.  Continue Cymbalta 60 mg daily, meloxicam 15 mg as needed.

## 2023-10-24 NOTE — Patient Instructions (Signed)
 Stop by the lab prior to leaving today. I will notify you of your results once received.   Start prednisone medication for the sciatica. Take 3 tablets by mouth daily in the morning x 3 days, then 2 tablets x 3 days, then 1 tablet x 3 days  You may take cyclobenzaprine 5 mg as needed for muscle spasms.  Take 1 to 2 tablets by mouth at bedtime.  This may cause drowsiness.  It was a pleasure to see you today!

## 2023-10-24 NOTE — Assessment & Plan Note (Signed)
 Stable.  Following with cardiology. Continue diltiazem ER 300 mg daily.

## 2023-10-24 NOTE — Assessment & Plan Note (Signed)
 With sciatica. No alarm signs on exam or HPI.  Start cyclobenzaprine 5 to 10 mg at bedtime. Start prednisone tablets.  Take 3 tablets by mouth daily in the morning x 3 days, then 2 tablets x 3 days, then 1 tablet x 3 days. Toradol 60 mg provided intramuscularly today.  Follow-up as needed.

## 2023-10-24 NOTE — Progress Notes (Signed)
 Subjective:    Patient ID: Caroline [name], female    DOB: [birth year], 63 y.o.   MRN: [id number]  Back Pain Pertinent negatives include no chest pain, headaches or numbness.    Caroline [name] is a very pleasant 63 y.o. female who presents today for complete physical and follow up of chronic conditions.  She would also like to discuss acute back pain. One week ago she tripped over her large dog in the kitchen, fell face forward onto the kitchen floor hitting the right side of her face. Since then she's experienced right lower back pain with radiation down to her toes. She's tried ice, heat, stretching, and Tylenol without improvement. Pain is worse with sleeping, standing, sitting. She denies loss of bowel/bladder control, numbness. She did sustain bruising and swelling to her right eye after the fall. These symptoms have improved. She has a mild intermittent headache which has also improved. Denies dizziness and visual changes.   Immunizations: -Tetanus: Completed in 2023 -Influenza: Completed this season  -Shingles: Completed Shingrix series  Diet: Fair diet.  Exercise: No regular exercise.  Eye exam: Completes annually  Dental exam: Completes semi-annually    Pap Smear: Completed in 2022 per GYN Mammogram: Completed in May 2024  Colonoscopy: Completed in 2016, due 2026 Lung Cancer Screening: Scheduled for March 2025  BP Readings from Last 3 Encounters:  10/24/23 136/66  08/10/23 136/76  05/09/23 124/68       Review of Systems  Constitutional:  Negative for unexpected weight change.  HENT:  Negative for rhinorrhea.   Respiratory:  Negative for cough and shortness of breath.   Cardiovascular:  Negative for chest pain.  Gastrointestinal:  Negative for constipation and diarrhea.  Genitourinary:  Negative for difficulty urinating.  Musculoskeletal:  Positive for arthralgias and back pain. Negative for myalgias.  Skin:  Negative for rash.  Allergic/Immunologic: Negative for  environmental allergies.  Neurological:  Negative for dizziness, numbness and headaches.  Psychiatric/Behavioral:  The patient is not nervous/anxious.          Past Medical History:  Diagnosis Date   Adnexal mass 02/21/2020   CAD (coronary artery disease)    has documented coronary vasospasm but with underlying 40% prox LAD, 30% prox LCX and 70 to 80% proximal 1st DX and 30% RCA; recurrent vasospasm with subsequent cath in Jan 2013.    Closed fracture of right humerus with routine healing 02/06/2013   Coronary vasospasm (HCC)    Environmental and seasonal allergies    Esophagitis    GERD (gastroesophageal reflux disease)    Hyperlipidemia    Hypertension    Hypothyroidism    IBS (irritable bowel syndrome)    Palpitations    tachycardia on holter; treated with beta blockers   Right ovarian cyst     Social History   Socioeconomic History   Marital status: Married    Spouse name: Not on file   Number of children: 2   Years of education: Not on file   Highest education level: Associate degree: academic program  Occupational History   Occupation: Unemployed  Tobacco Use   Smoking status: Former    Current packs/day: 0.00    Average packs/day: 0.3 packs/day for 30.0 years (7.5 ttl pk-yrs)    Types: Cigarettes    Start date: 05/30/1984    Quit date: 05/30/2014    Years since quitting: 9.4   Smokeless tobacco: Never  Vaping Use   Vaping status: Never Used  Substance and Sexual Activity   Alcohol  use: Yes    Alcohol/week: 2.0 standard drinks of alcohol    Types: 2 Standard drinks or equivalent per week    Comment: occ   Drug use: No   Sexual activity: Yes  Other Topics Concern   Not on file  Social History Narrative   Married.   2 children.   Retired, once worked in Chief Financial Officer.   She enjoys being with her dogs, shopping.    Social Drivers of Corporate investment banker Strain: Low Risk  (02/15/2023)   Overall Financial Resource Strain (CARDIA)    Difficulty of  Paying Living Expenses: Not hard at all  Food Insecurity: No Food Insecurity (02/15/2023)   Hunger Vital Sign    Worried About Running Out of Food in the Last Year: Never true    Ran Out of Food in the Last Year: Never true  Transportation Needs: No Transportation Needs (02/15/2023)   PRAPARE - Administrator, Civil Service (Medical): No    Lack of Transportation (Non-Medical): No  Physical Activity: Sufficiently Active (02/15/2023)   Exercise Vital Sign    Days of Exercise per Week: 3 days    Minutes of Exercise per Session: 90 min  Stress: No Stress Concern Present (02/15/2023)   [employer] of Occupational Health - Occupational Stress Questionnaire    Feeling of Stress : Only a little  Social Connections: Unknown (02/15/2023)   Social Connection and Isolation Panel [NHANES]    Frequency of Communication with Friends and Family: Twice a week    Frequency of Social Gatherings with Friends and Family: Never    Attends Religious Services: Patient declined    Database administrator or Organizations: No    Attends Engineer, structural: Not on file    Marital Status: Married  Catering manager Violence: Not on file    Past Surgical History:  Procedure Laterality Date   APPENDECTOMY  1976   BREAST EXCISIONAL BIOPSY Right    benign   BREAST SURGERY Right 2002   biopsy   CARDIAC CATHETERIZATION  2011   CARDIAC CATHETERIZATION  05/2011   With documented vasospasm but with residual disease; managed medically   CHOLECYSTECTOMY  1982   open   INTRACAPSULAR CATARACT EXTRACTION Right    LAPAROSCOPIC SALPINGO OOPHERECTOMY Right 09/18/2020   Procedure: LAPAROSCOPIC SALPINGO OOPHORECTOMY;  Surgeon: Noland Fordyce, MD;  Location: [facility] [place];  Service: Gynecology;  Laterality: Right;   LEFT HEART CATH AND CORONARY ANGIOGRAPHY N/A 10/08/2021   Procedure: LEFT HEART CATH AND CORONARY ANGIOGRAPHY;  Surgeon: Kathleene Hazel, MD;  Location: [facility] INVASIVE  CV LAB;  Service: Cardiovascular;  Laterality: N/A;   LEFT HEART CATHETERIZATION WITH CORONARY ANGIOGRAM N/A 09/14/2011   Procedure: LEFT HEART CATHETERIZATION WITH CORONARY ANGIOGRAM;  Surgeon: Kathleene Hazel, MD;  Location: [facility] CATH LAB;  Service: Cardiovascular;  Laterality: N/A;   UPPER GASTROINTESTINAL ENDOSCOPY  01/11/2011   esophagitis    Family History  Problem Relation Age of Onset   Breast cancer Mother 10   Heart disease Mother        died CHF 76   Diabetes Mother    Prostate cancer Father    Hyperlipidemia Father    Heart disease Father        s/p cabg - alive   Colon cancer Other        First cousin on dads side    Thyroid disease Maternal Grandmother     Allergies  Allergen Reactions  Octacosanol Itching    Current Outpatient Medications on File Prior to Visit  Medication Sig Dispense Refill   acetaminophen (TYLENOL) 500 MG tablet Take 500 mg by mouth every 6 (six) hours as needed. For pain     albuterol (VENTOLIN HFA) 108 (90 Base) MCG/ACT inhaler Inhale 2 puffs into the lungs every 6 (six) hours as needed. 8.5 each 11   aspirin 81 MG tablet Take 81 mg by mouth daily.     atorvastatin (LIPITOR) 20 MG tablet Take 1 tablet (20 mg total) by mouth daily. 90 tablet 3   Cholecalciferol (VITAMIN D) 2000 units CAPS Take 2000 units once per day. 30 capsule    Cyanocobalamin (VITAMIN B-12 PO) Take by mouth.     diclofenac Sodium (VOLTAREN) 1 % GEL Apply 2-4 g topically 4 (four) times daily. 200 g 2   diltiazem (TIADYLT ER) 300 MG 24 hr capsule TAKE 1 CAPSULE BY MOUTH EVERY DAY 90 capsule 3   DULoxetine (CYMBALTA) 60 MG capsule Take 1 capsule (60 mg total) by mouth daily. For anxiety, depression, pain 90 capsule 3   fluticasone (FLONASE) 50 MCG/ACT nasal spray Place 2 sprays into both nostrils daily. (Patient taking differently: Place 2 sprays into both nostrils daily as needed for allergies.) 16 g 6   isosorbide dinitrate (ISORDIL) 30 MG tablet TAKE 1 TABLET BY  MOUTH THREE TIMES A DAY NEED OFFICE VISIT 270 tablet 1   levocetirizine (XYZAL) 5 MG tablet Take 1 tablet (5 mg total) by mouth every evening. For allergies 90 tablet 3   levothyroxine (SYNTHROID) 50 MCG tablet TAKE 1 TABLET BY MOUTH EVERY MORNING ON EMPTY STOMACH WITH WATER NO FOOD OR OTHER MEDS FOR 90 tablet 0   meloxicam (MOBIC) 15 MG tablet TAKE 1 TABLET (15 MG TOTAL) BY MOUTH DAILY. (Patient taking differently: Take 15 mg by mouth as needed.) 30 tablet 3   nitroGLYCERIN (NITROSTAT) 0.4 MG SL tablet PLACE 1 TABLET (0.4 MG TOTAL) UNDER THE TONGUE EVERY 5 MINUTES AS NEEDED FOR CHEST PAIN. X 3 DOSES.  Please call [phone] to schedule an appointment for future refills. Thank you. 75 tablet 0   Polyethyl Glycol-Propyl Glycol (SYSTANE) 0.4-0.3 % SOLN Place 1 drop into both eyes in the morning, at noon, in the evening, and at bedtime.     No current facility-administered medications on file prior to visit.    BP 136/66   Pulse 85   Temp (!) 97.5 F (36.4 C) (Temporal)   Ht 5\' 4"  (1.626 m)   Wt 182 lb (82.6 kg)   LMP 07/28/2010   SpO2 95%   BMI 31.24 kg/m  Objective:   Physical Exam Constitutional:      General: She is in acute distress.  HENT:     Right Ear: Tympanic membrane and ear canal normal.     Left Ear: Tympanic membrane and ear canal normal.  Eyes:     Pupils: Pupils are equal, round, and reactive to light.  Cardiovascular:     Rate and Rhythm: Normal rate and regular rhythm.  Pulmonary:     Effort: Pulmonary effort is normal.     Breath sounds: Normal breath sounds.  Abdominal:     General: Bowel sounds are normal.     Palpations: Abdomen is soft.     Tenderness: There is no abdominal tenderness.  Musculoskeletal:     Cervical back: Neck supple.     Lumbar back: Decreased range of motion. Negative right straight leg raise test  and negative left straight leg raise test.       Back:  Skin:    General: Skin is warm and dry.  Neurological:     Mental  Status: She is alert and oriented to person, place, and time.     Cranial Nerves: No cranial nerve deficit.     Deep Tendon Reflexes:     Reflex Scores:      Patellar reflexes are 2+ on the right side and 2+ on the left side. Psychiatric:        Mood and Affect: Mood normal.           Assessment & Plan:  Encounter for annual general medical examination with abnormal findings in adult Assessment & Plan: Immunizations UTD. Pap smear UTD.  Follows with GYN Mammogram UTD. Colonoscopy UTD, due 2026  Discussed the importance of a healthy diet and regular exercise in order for weight loss, and to reduce the risk of further co-morbidity.  Exam stable. Labs pending.  Follow up in 1 year for repeat physical.    Coronary artery disease involving native coronary artery of native heart without angina pectoris Assessment & Plan: Asymptomatic.   Following with cardiology, office notes reviewed from September 2024. Continue lipid control. Continue aspirin 81 mg daily.   Primary hypertension Assessment & Plan: Stable.  Following with cardiology. Continue diltiazem ER 300 mg daily.  Orders: -     Comprehensive metabolic panel -     CBC  Intrinsic asthma Assessment & Plan: Controlled.  Continue albuterol inhaler as needed.   Acquired hypothyroidism Assessment & Plan: She is taking levothyroxine correctly.  Continue levothyroxine 50 mcg daily. Repeat TSH pending.  Orders: -     TSH  Anxiety and depression Assessment & Plan: Controlled.  Continue Cymbalta 60 mg daily.   Dyslipidemia Assessment & Plan: Repeat lipid panel pending.  Continue atorvastatin 20 mg daily.  Orders: -     Lipid panel  Environmental and seasonal allergies Assessment & Plan: Stable.  Continue Xyzal 5 mg as needed, albuterol inhaler as needed.   Fibromyalgia Assessment & Plan: Acute on chronic flare with recent fall.  Continue Cymbalta 60 mg daily, meloxicam 15 mg as  needed.   Prediabetes Assessment & Plan: Repeat A1c pending.  Orders: -     Hemoglobin A1c  OSA (obstructive sleep apnea) Assessment & Plan: Following with pulmonology.  Continue CPAP nightly.   Acute low back pain due to trauma Assessment & Plan: With sciatica. No alarm signs on exam or HPI.  Start cyclobenzaprine 5 to 10 mg at bedtime. Start prednisone tablets.  Take 3 tablets by mouth daily in the morning x 3 days, then 2 tablets x 3 days, then 1 tablet x 3 days. Toradol 60 mg provided intramuscularly today.  Follow-up as needed.  Orders: -     predniSONE; Take 3 tablets by mouth daily in the morning x 3 days, then 2 tablets x 3 days, then 1 tablet x 3 days  Dispense: 18 tablet; Refill: 0 -     Cyclobenzaprine HCl; Take 1-2 tablets (5-10 mg total) by mouth 3 (three) times daily as needed.  Dispense: 15 tablet; Refill: 0        Doreene Nest, NP

## 2023-10-24 NOTE — Assessment & Plan Note (Signed)
 She is taking levothyroxine correctly.  Continue levothyroxine 50 mcg daily. Repeat TSH pending.

## 2023-10-24 NOTE — Assessment & Plan Note (Signed)
 Following with pulmonology. Continue CPAP nightly.

## 2023-10-24 NOTE — Assessment & Plan Note (Signed)
 Stable.  Continue Xyzal 5 mg as needed, albuterol inhaler as needed.

## 2023-10-24 NOTE — Assessment & Plan Note (Signed)
 Immunizations UTD. Pap smear UTD.  Follows with GYN Mammogram UTD. Colonoscopy UTD, due 2026  Discussed the importance of a healthy diet and regular exercise in order for weight loss, and to reduce the risk of further co-morbidity.  Exam stable. Labs pending.  Follow up in 1 year for repeat physical.

## 2023-10-26 DIAGNOSIS — M9905 Segmental and somatic dysfunction of pelvic region: Secondary | ICD-10-CM | POA: Diagnosis not present

## 2023-10-26 DIAGNOSIS — M5431 Sciatica, right side: Secondary | ICD-10-CM | POA: Diagnosis not present

## 2023-10-26 DIAGNOSIS — M4306 Spondylolysis, lumbar region: Secondary | ICD-10-CM | POA: Diagnosis not present

## 2023-10-26 DIAGNOSIS — M9903 Segmental and somatic dysfunction of lumbar region: Secondary | ICD-10-CM | POA: Diagnosis not present

## 2023-10-27 ENCOUNTER — Encounter: Payer: Self-pay | Admitting: Physician Assistant

## 2023-10-27 ENCOUNTER — Ambulatory Visit (INDEPENDENT_AMBULATORY_CARE_PROVIDER_SITE_OTHER): Payer: BC Managed Care – PPO | Admitting: Physician Assistant

## 2023-10-27 DIAGNOSIS — Z87891 Personal history of nicotine dependence: Secondary | ICD-10-CM | POA: Diagnosis not present

## 2023-10-27 DIAGNOSIS — M9903 Segmental and somatic dysfunction of lumbar region: Secondary | ICD-10-CM | POA: Diagnosis not present

## 2023-10-27 DIAGNOSIS — M9905 Segmental and somatic dysfunction of pelvic region: Secondary | ICD-10-CM | POA: Diagnosis not present

## 2023-10-27 DIAGNOSIS — M4306 Spondylolysis, lumbar region: Secondary | ICD-10-CM | POA: Diagnosis not present

## 2023-10-27 DIAGNOSIS — M5431 Sciatica, right side: Secondary | ICD-10-CM | POA: Diagnosis not present

## 2023-10-27 NOTE — Patient Instructions (Signed)

## 2023-10-27 NOTE — Progress Notes (Signed)
 Virtual Visit via Telephone Note  I connected with Caroline [name] on 10/27/23 at 11:01 AM by telephone and verified that I am speaking with the correct person using two identifiers.  Location: Patient: home Provider: working virtually from home   I discussed the limitations, risks, security and privacy concerns of performing an evaluation and management service by telephone and the availability of in person appointments. I also discussed with the patient that there may be a patient responsible charge related to this service. The patient expressed understanding and agreed to proceed.       Shared Decision Making Visit Lung Cancer Screening Program [phone])   Eligibility: Age 63 Pack Years Smoking History Calculation 38 (# packs/per year x # years smoked) Recent History of coughing up blood  no Unexplained weight loss? No ( >Than 15 pounds within the last 6 months ) Prior History Lung / other cancer No (Diagnosis within the last 5 years already requiring surveillance chest CT Scans). Smoking Status Former Smoker Former Smokers: Years since quit: 2 years  Quit Date: 2023  Visit Components: Discussion included one or more decision making aids? Yes Discussion included risk/benefits of screening. Yes Discussion included potential follow up diagnostic testing for abnormal scans. Yes Discussion included meaning and risk of over diagnosis. Yes Discussion included meaning and risk of False Positives. Yes Discussion included meaning of total radiation exposure. Yes  Counseling Included: Importance of adherence to annual lung cancer LDCT screening. Yes Impact of comorbidities on ability to participate in the program. Yes Ability and willingness to under diagnostic treatment. Yes  Smoking Cessation Counseling: Former Smokers:  Discussed the importance of maintaining cigarette abstinence. Yes Diagnosis Code: Personal History of Nicotine Dependence. V25.366 Information about tobacco  cessation classes and interventions provided to patient. Yes Written Order for Lung Cancer Screening with LDCT placed in Epic. Yes (CT Chest Lung Cancer Screening Low Dose W/O CM) [id number] Z12.2-Screening of respiratory organs Z87.891-Personal history of nicotine dependence    I spent 25 minutes of face to face time/virtual visit time  with the patient discussing the risks and benefits of lung cancer screening. We took the time to pause at intervals to allow for questions to be asked and answered to ensure understanding. We discussed that they had taken the single most powerful action possible to decrease their risk of developing lung cancer when they quit smoking. I counseled them to remain smoke free, and to contact the office if they ever had the desire to smoke again so that we can provide resources and tools to help support the effort to remain smoke free. We discussed the time and location of the scan, and they  will receive a call or letter with the results within  24-72 hours of receiving them. They have the office contact information in the event they have questions.   They verbalized understanding of all of the above and had no further questions.    I explained to the patient that there has been a high incidence of coronary artery disease noted on these exams. I explained that this is a non-gated exam therefore degree or severity cannot be determined. This patient is on statin therapy. I have asked the patient to follow-up with their PCP regarding any incidental finding of coronary artery disease and management with diet or medication as they feel is clinically indicated. The patient verbalized understanding of the above and had no further questions.      Darcella Gasman TRUE [place], PA-C

## 2023-10-28 DIAGNOSIS — M9903 Segmental and somatic dysfunction of lumbar region: Secondary | ICD-10-CM | POA: Diagnosis not present

## 2023-10-28 DIAGNOSIS — M5431 Sciatica, right side: Secondary | ICD-10-CM | POA: Diagnosis not present

## 2023-10-28 DIAGNOSIS — M4306 Spondylolysis, lumbar region: Secondary | ICD-10-CM | POA: Diagnosis not present

## 2023-10-28 DIAGNOSIS — M9905 Segmental and somatic dysfunction of pelvic region: Secondary | ICD-10-CM | POA: Diagnosis not present

## 2023-10-31 ENCOUNTER — Ambulatory Visit
Admission: RE | Admit: 2023-10-31 | Discharge: 2023-10-31 | Disposition: A | Discharge status: Home / Self Care | Payer: BC Managed Care – PPO | Source: Ambulatory Visit | Attending: Acute Care | Admitting: Acute Care

## 2023-10-31 DIAGNOSIS — Z87891 Personal history of nicotine dependence: Secondary | ICD-10-CM | POA: Diagnosis not present

## 2023-10-31 DIAGNOSIS — Z122 Encounter for screening for malignant neoplasm of respiratory organs: Secondary | ICD-10-CM | POA: Diagnosis not present

## 2023-11-01 DIAGNOSIS — M9905 Segmental and somatic dysfunction of pelvic region: Secondary | ICD-10-CM | POA: Diagnosis not present

## 2023-11-01 DIAGNOSIS — M9903 Segmental and somatic dysfunction of lumbar region: Secondary | ICD-10-CM | POA: Diagnosis not present

## 2023-11-01 DIAGNOSIS — M4306 Spondylolysis, lumbar region: Secondary | ICD-10-CM | POA: Diagnosis not present

## 2023-11-01 DIAGNOSIS — M5431 Sciatica, right side: Secondary | ICD-10-CM | POA: Diagnosis not present

## 2023-11-03 DIAGNOSIS — M4306 Spondylolysis, lumbar region: Secondary | ICD-10-CM | POA: Diagnosis not present

## 2023-11-03 DIAGNOSIS — M9903 Segmental and somatic dysfunction of lumbar region: Secondary | ICD-10-CM | POA: Diagnosis not present

## 2023-11-03 DIAGNOSIS — M9905 Segmental and somatic dysfunction of pelvic region: Secondary | ICD-10-CM | POA: Diagnosis not present

## 2023-11-03 DIAGNOSIS — M5431 Sciatica, right side: Secondary | ICD-10-CM | POA: Diagnosis not present

## 2023-11-07 DIAGNOSIS — M9905 Segmental and somatic dysfunction of pelvic region: Secondary | ICD-10-CM | POA: Diagnosis not present

## 2023-11-07 DIAGNOSIS — M9903 Segmental and somatic dysfunction of lumbar region: Secondary | ICD-10-CM | POA: Diagnosis not present

## 2023-11-07 DIAGNOSIS — M4306 Spondylolysis, lumbar region: Secondary | ICD-10-CM | POA: Diagnosis not present

## 2023-11-07 DIAGNOSIS — M5431 Sciatica, right side: Secondary | ICD-10-CM | POA: Diagnosis not present

## 2023-11-10 DIAGNOSIS — M5431 Sciatica, right side: Secondary | ICD-10-CM | POA: Diagnosis not present

## 2023-11-10 DIAGNOSIS — M4306 Spondylolysis, lumbar region: Secondary | ICD-10-CM | POA: Diagnosis not present

## 2023-11-10 DIAGNOSIS — M9905 Segmental and somatic dysfunction of pelvic region: Secondary | ICD-10-CM | POA: Diagnosis not present

## 2023-11-10 DIAGNOSIS — M9903 Segmental and somatic dysfunction of lumbar region: Secondary | ICD-10-CM | POA: Diagnosis not present

## 2023-11-11 DIAGNOSIS — M4306 Spondylolysis, lumbar region: Secondary | ICD-10-CM | POA: Diagnosis not present

## 2023-11-11 DIAGNOSIS — M5431 Sciatica, right side: Secondary | ICD-10-CM | POA: Diagnosis not present

## 2023-11-11 DIAGNOSIS — M9905 Segmental and somatic dysfunction of pelvic region: Secondary | ICD-10-CM | POA: Diagnosis not present

## 2023-11-11 DIAGNOSIS — M9903 Segmental and somatic dysfunction of lumbar region: Secondary | ICD-10-CM | POA: Diagnosis not present

## 2023-11-14 DIAGNOSIS — M4306 Spondylolysis, lumbar region: Secondary | ICD-10-CM | POA: Diagnosis not present

## 2023-11-14 DIAGNOSIS — M9905 Segmental and somatic dysfunction of pelvic region: Secondary | ICD-10-CM | POA: Diagnosis not present

## 2023-11-14 DIAGNOSIS — M5431 Sciatica, right side: Secondary | ICD-10-CM | POA: Diagnosis not present

## 2023-11-14 DIAGNOSIS — M9903 Segmental and somatic dysfunction of lumbar region: Secondary | ICD-10-CM | POA: Diagnosis not present

## 2023-11-16 ENCOUNTER — Other Ambulatory Visit: Payer: Self-pay | Admitting: Cardiovascular Disease

## 2023-11-17 DIAGNOSIS — M5431 Sciatica, right side: Secondary | ICD-10-CM | POA: Diagnosis not present

## 2023-11-17 DIAGNOSIS — M4306 Spondylolysis, lumbar region: Secondary | ICD-10-CM | POA: Diagnosis not present

## 2023-11-17 DIAGNOSIS — M9903 Segmental and somatic dysfunction of lumbar region: Secondary | ICD-10-CM | POA: Diagnosis not present

## 2023-11-17 DIAGNOSIS — M9905 Segmental and somatic dysfunction of pelvic region: Secondary | ICD-10-CM | POA: Diagnosis not present

## 2023-11-18 DIAGNOSIS — G4733 Obstructive sleep apnea (adult) (pediatric): Secondary | ICD-10-CM | POA: Diagnosis not present

## 2023-11-21 DIAGNOSIS — M9903 Segmental and somatic dysfunction of lumbar region: Secondary | ICD-10-CM | POA: Diagnosis not present

## 2023-11-21 DIAGNOSIS — M4306 Spondylolysis, lumbar region: Secondary | ICD-10-CM | POA: Diagnosis not present

## 2023-11-21 DIAGNOSIS — M9905 Segmental and somatic dysfunction of pelvic region: Secondary | ICD-10-CM | POA: Diagnosis not present

## 2023-11-21 DIAGNOSIS — M5431 Sciatica, right side: Secondary | ICD-10-CM | POA: Diagnosis not present

## 2023-11-24 DIAGNOSIS — M5431 Sciatica, right side: Secondary | ICD-10-CM | POA: Diagnosis not present

## 2023-11-24 DIAGNOSIS — M9903 Segmental and somatic dysfunction of lumbar region: Secondary | ICD-10-CM | POA: Diagnosis not present

## 2023-11-24 DIAGNOSIS — M4306 Spondylolysis, lumbar region: Secondary | ICD-10-CM | POA: Diagnosis not present

## 2023-11-24 DIAGNOSIS — M9905 Segmental and somatic dysfunction of pelvic region: Secondary | ICD-10-CM | POA: Diagnosis not present

## 2023-11-29 ENCOUNTER — Other Ambulatory Visit: Payer: Self-pay

## 2023-11-29 ENCOUNTER — Other Ambulatory Visit: Payer: Self-pay | Admitting: Primary Care

## 2023-11-29 DIAGNOSIS — M4306 Spondylolysis, lumbar region: Secondary | ICD-10-CM | POA: Diagnosis not present

## 2023-11-29 DIAGNOSIS — Z122 Encounter for screening for malignant neoplasm of respiratory organs: Secondary | ICD-10-CM

## 2023-11-29 DIAGNOSIS — Z87891 Personal history of nicotine dependence: Secondary | ICD-10-CM

## 2023-11-29 DIAGNOSIS — F32A Depression, unspecified: Secondary | ICD-10-CM

## 2023-11-29 DIAGNOSIS — M9903 Segmental and somatic dysfunction of lumbar region: Secondary | ICD-10-CM | POA: Diagnosis not present

## 2023-11-29 DIAGNOSIS — M5431 Sciatica, right side: Secondary | ICD-10-CM | POA: Diagnosis not present

## 2023-11-29 DIAGNOSIS — M9905 Segmental and somatic dysfunction of pelvic region: Secondary | ICD-10-CM | POA: Diagnosis not present

## 2023-11-29 DIAGNOSIS — M797 Fibromyalgia: Secondary | ICD-10-CM

## 2023-12-05 DIAGNOSIS — M5431 Sciatica, right side: Secondary | ICD-10-CM | POA: Diagnosis not present

## 2023-12-05 DIAGNOSIS — M4306 Spondylolysis, lumbar region: Secondary | ICD-10-CM | POA: Diagnosis not present

## 2023-12-05 DIAGNOSIS — M9903 Segmental and somatic dysfunction of lumbar region: Secondary | ICD-10-CM | POA: Diagnosis not present

## 2023-12-05 DIAGNOSIS — M9905 Segmental and somatic dysfunction of pelvic region: Secondary | ICD-10-CM | POA: Diagnosis not present

## 2023-12-08 ENCOUNTER — Other Ambulatory Visit: Payer: Self-pay | Admitting: Primary Care

## 2023-12-08 DIAGNOSIS — E039 Hypothyroidism, unspecified: Secondary | ICD-10-CM

## 2023-12-14 DIAGNOSIS — M5431 Sciatica, right side: Secondary | ICD-10-CM | POA: Diagnosis not present

## 2023-12-14 DIAGNOSIS — M4306 Spondylolysis, lumbar region: Secondary | ICD-10-CM | POA: Diagnosis not present

## 2023-12-14 DIAGNOSIS — M9903 Segmental and somatic dysfunction of lumbar region: Secondary | ICD-10-CM | POA: Diagnosis not present

## 2023-12-14 DIAGNOSIS — M9905 Segmental and somatic dysfunction of pelvic region: Secondary | ICD-10-CM | POA: Diagnosis not present

## 2023-12-19 DIAGNOSIS — L400 Psoriasis vulgaris: Secondary | ICD-10-CM | POA: Diagnosis not present

## 2023-12-19 DIAGNOSIS — D2261 Melanocytic nevi of right upper limb, including shoulder: Secondary | ICD-10-CM | POA: Diagnosis not present

## 2023-12-19 DIAGNOSIS — D2262 Melanocytic nevi of left upper limb, including shoulder: Secondary | ICD-10-CM | POA: Diagnosis not present

## 2023-12-19 DIAGNOSIS — D225 Melanocytic nevi of trunk: Secondary | ICD-10-CM | POA: Diagnosis not present

## 2024-01-01 ENCOUNTER — Encounter: Payer: Self-pay | Admitting: Physician Assistant

## 2024-01-01 ENCOUNTER — Telehealth: Admitting: Physician Assistant

## 2024-01-01 DIAGNOSIS — J309 Allergic rhinitis, unspecified: Secondary | ICD-10-CM | POA: Diagnosis not present

## 2024-01-01 DIAGNOSIS — J069 Acute upper respiratory infection, unspecified: Secondary | ICD-10-CM | POA: Diagnosis not present

## 2024-01-01 MED ORDER — BENZONATATE 100 MG PO CAPS: 100.0000 mg | ORAL_CAPSULE | Freq: Two times a day (BID) | ORAL | 0 refills | Status: DC | PRN

## 2024-01-01 MED ORDER — AZELASTINE HCL 0.1 % NA SOLN
2.0000 | Freq: Two times a day (BID) | NASAL | 12 refills | Status: AC
Start: 2024-01-01 — End: ?

## 2024-01-01 MED ORDER — LEVOCETIRIZINE DIHYDROCHLORIDE 5 MG PO TABS: 5.0000 mg | ORAL_TABLET | Freq: Every evening | ORAL | 3 refills | Status: AC

## 2024-01-01 MED ORDER — METHYLPREDNISOLONE 4 MG PO TBPK: ORAL_TABLET | ORAL | 0 refills | Status: DC

## 2024-01-01 NOTE — Patient Instructions (Signed)
 Caroline [name], thank you for joining Caroline Settle, PA-C for today's virtual visit.  While this provider is not your primary care provider (PCP), if your PCP is located in our provider database this encounter information will be shared with them immediately following your visit.   A [facility] MyChart account gives you access to today's visit and all your visits, tests, and labs performed at [facility] " click here if you don't have a [facility] MyChart account or go to [email or web address]  Consent: (Patient) Caroline [name] provided verbal consent for this virtual visit at the beginning of the encounter.  Current Medications:  Current Outpatient Medications:    acetaminophen  (TYLENOL ) 500 MG tablet, Take 500 mg by mouth every 6 (six) hours as needed. For pain, Disp: , Rfl:    albuterol  (VENTOLIN  HFA) 108 (90 Base) MCG/ACT inhaler, Inhale 2 puffs into the lungs every 6 (six) hours as needed., Disp: 8.5 each, Rfl: 11   aspirin  81 MG tablet, Take 81 mg by mouth daily., Disp: , Rfl:    atorvastatin  (LIPITOR) 20 MG tablet, Take 1 tablet (20 mg total) by mouth daily., Disp: 90 tablet, Rfl: 3   Cholecalciferol (VITAMIN D ) 2000 units CAPS, Take 2000 units once per day., Disp: 30 capsule, Rfl:    Cyanocobalamin (VITAMIN B-12 PO), Take by mouth., Disp: , Rfl:    cyclobenzaprine  (FLEXERIL ) 5 MG tablet, Take 1-2 tablets (5-10 mg total) by mouth 3 (three) times daily as needed., Disp: 15 tablet, Rfl: 0   diclofenac  Sodium (VOLTAREN ) 1 % GEL, Apply 2-4 g topically 4 (four) times daily., Disp: 200 g, Rfl: 2   diltiazem  (TIADYLT  ER) 300 MG 24 hr capsule, TAKE 1 CAPSULE BY MOUTH EVERY DAY, Disp: 90 capsule, Rfl: 3   DULoxetine  (CYMBALTA ) 60 MG capsule, TAKE 1 CAPSULE (60 MG TOTAL) BY MOUTH DAILY. FOR ANXIETY, DEPRESSION, PAIN, Disp: 90 capsule, Rfl: 2   fluticasone  (FLONASE ) 50 MCG/ACT nasal spray, Place 2 sprays into both nostrils daily. (Patient taking differently: Place 2 sprays into both  nostrils daily as needed for allergies.), Disp: 16 g, Rfl: 6   isosorbide  dinitrate (ISORDIL ) 30 MG tablet, TAKE 1 TABLET BY MOUTH THREE TIMES A DAY NEED OFFICE VISIT, Disp: 270 tablet, Rfl: 1   levocetirizine (XYZAL ) 5 MG tablet, Take 1 tablet (5 mg total) by mouth every evening. For allergies, Disp: 90 tablet, Rfl: 3   levothyroxine  (SYNTHROID ) 50 MCG tablet, TAKE 1 TABLET BY MOUTH EVERY MORNING ON EMPTY STOMACH WITH WATER NO FOOD OR OTHER MEDS FOR , Disp: 90 tablet, Rfl: 2   meloxicam  (MOBIC ) 15 MG tablet, TAKE 1 TABLET (15 MG TOTAL) BY MOUTH DAILY. (Patient taking differently: Take 15 mg by mouth as needed.), Disp: 30 tablet, Rfl: 3   nitroGLYCERIN  (NITROSTAT ) 0.4 MG SL tablet, PLACE 1 TABLET (0.4 MG TOTAL) UNDER THE TONGUE EVERY 5 MINUTES AS NEEDED FOR CHEST PAIN. X 3 DOSES.  Please call [phone] to schedule an appointment for future refills. Thank you., Disp: 75 tablet, Rfl: 0   Polyethyl Glycol-Propyl Glycol (SYSTANE) 0.4-0.3 % SOLN, Place 1 drop into both eyes in the morning, at noon, in the evening, and at bedtime., Disp: , Rfl:    predniSONE  (DELTASONE ) 20 MG tablet, Take 3 tablets by mouth daily in the morning x 3 days, then 2 tablets x 3 days, then 1 tablet x 3 days, Disp: 18 tablet, Rfl: 0   Medications ordered in this encounter:  No orders of the defined types were placed in  this encounter.    *If you need refills on other medications prior to your next appointment, please contact your pharmacy*  Follow-Up: Call back or seek an in-person evaluation if the symptoms worsen or if the condition fails to improve as anticipated.  [facility] [phone]  Other Instructions Please report to the nearest Emergency room with any worsening symptoms. Follow up with primary care provider (PCP) in 2 -3 days.    If you have been instructed to have an in-person evaluation today at a local Urgent Care facility, please use the link below. It will take you to a list of  all of our available [facility], including address, phone number and hours of operation. Please do not delay care.  [facility]  If you or a family member do not have a primary care provider, use the link below to schedule a visit and establish care. When you choose a [facility] primary care physician or advanced practice provider, you gain a long-term partner in health. Find a Primary Care Provider  Learn more about [facility]'s in-office and virtual care options: [facility] - Get Care Now

## 2024-01-01 NOTE — Progress Notes (Signed)
 Virtual Visit Consent   Caroline [name], you are scheduled for a virtual visit with a [facility] provider today. Just as with appointments in the office, your consent must be obtained to participate. Your consent will be active for this visit and any virtual visit you may have with one of our providers in the next 365 days. If you have a MyChart account, a copy of this consent can be sent to you electronically.  As this is a virtual visit, video technology does not allow for your provider to perform a traditional examination. This may limit your provider's ability to fully assess your condition. If your provider identifies any concerns that need to be evaluated in person or the need to arrange testing (such as labs, EKG, etc.), we will make arrangements to do so. Although advances in technology are sophisticated, we cannot ensure that it will always work on either your end or our end. If the connection with a video visit is poor, the visit may have to be switched to a telephone visit. With either a video or telephone visit, we are not always able to ensure that we have a secure connection.  By engaging in this virtual visit, you consent to the provision of healthcare and authorize for your insurance to be billed (if applicable) for the services provided during this visit. Depending on your insurance coverage, you may receive a charge related to this service.  I need to obtain your verbal consent now. Are you willing to proceed with your visit today? [name] has provided verbal consent on 01/01/2024 for a virtual visit (video or telephone). [name], [place]  Date: 01/01/2024 11:36 AM   Virtual Visit via Video Note   I, [name], connected with  Tomicka Nicklaus  ([id number], 08-Aug-1961) on 01/01/24 at 11:30 AM EDT by a video-enabled telemedicine application and verified that I am speaking with the correct person using two identifiers.  Location: Patient: Virtual Visit Location Patient:  Home Provider: Virtual Visit Location Provider: Home Office   I discussed the limitations of evaluation and management by telemedicine and the availability of in person appointments. The patient expressed understanding and agreed to proceed.    History of Present Illness: Caroline [name] is a 63 y.o. who identifies as a female who was assigned female at birth, and is being seen today for URI s.  HPI: URI  This is a new problem. The current episode started in the past 7 days. The problem has been unchanged. There has been no fever. Associated symptoms include congestion, coughing, rhinorrhea and sneezing. Pertinent negatives include no sinus pain. She has tried decongestant for the symptoms. The treatment provided mild relief.    Problems:  Patient Active Problem List   Diagnosis Date Noted   Acute low back pain due to trauma 10/24/2023   Varicose veins of both lower extremities with pain 02/16/2023   OSA (obstructive sleep apnea) 05/26/2022   Fibromyalgia 05/26/2022   Chronic fatigue 10/16/2021   Status post carpal tunnel release 07/01/2021   Multiple renal cysts 02/21/2020   Anxiety and depression 04/26/2019   Hypothyroidism 03/15/2019   Environmental and seasonal allergies 06/09/2018   Encounter for annual general medical examination with abnormal findings in adult 06/09/2018   Prediabetes 06/09/2018   Hypertension    Lateral epicondylitis of left elbow 10/07/2016   Carpal tunnel syndrome, right upper limb 10/07/2016   Intrinsic asthma 08/06/2014   Dyslipidemia 02/13/2014   IBS (irritable bowel syndrome) 09/15/2011   Nonspecific elevation of  levels of transaminase or lactic acid dehydrogenase (LDH) 09/15/2011   Coronary vasospasm (HCC)    Tachycardia 07/28/2011   History of esophagitis 07/28/2011   CAD (coronary artery disease) 05/28/2011    Allergies:  Allergies  Allergen Reactions   Octacosanol Itching   Medications:  Current Outpatient Medications:    acetaminophen   (TYLENOL ) 500 MG tablet, Take 500 mg by mouth every 6 (six) hours as needed. For pain, Disp: , Rfl:    albuterol  (VENTOLIN  HFA) 108 (90 Base) MCG/ACT inhaler, Inhale 2 puffs into the lungs every 6 (six) hours as needed., Disp: 8.5 each, Rfl: 11   aspirin  81 MG tablet, Take 81 mg by mouth daily., Disp: , Rfl:    atorvastatin  (LIPITOR) 20 MG tablet, Take 1 tablet (20 mg total) by mouth daily., Disp: 90 tablet, Rfl: 3   Cholecalciferol (VITAMIN D ) 2000 units CAPS, Take 2000 units once per day., Disp: 30 capsule, Rfl:    Cyanocobalamin (VITAMIN B-12 PO), Take by mouth., Disp: , Rfl:    cyclobenzaprine  (FLEXERIL ) 5 MG tablet, Take 1-2 tablets (5-10 mg total) by mouth 3 (three) times daily as needed., Disp: 15 tablet, Rfl: 0   diclofenac  Sodium (VOLTAREN ) 1 % GEL, Apply 2-4 g topically 4 (four) times daily., Disp: 200 g, Rfl: 2   diltiazem  (TIADYLT  ER) 300 MG 24 hr capsule, TAKE 1 CAPSULE BY MOUTH EVERY DAY, Disp: 90 capsule, Rfl: 3   DULoxetine  (CYMBALTA ) 60 MG capsule, TAKE 1 CAPSULE (60 MG TOTAL) BY MOUTH DAILY. FOR ANXIETY, DEPRESSION, PAIN, Disp: 90 capsule, Rfl: 2   fluticasone  (FLONASE ) 50 MCG/ACT nasal spray, Place 2 sprays into both nostrils daily. (Patient taking differently: Place 2 sprays into both nostrils daily as needed for allergies.), Disp: 16 g, Rfl: 6   isosorbide  dinitrate (ISORDIL ) 30 MG tablet, TAKE 1 TABLET BY MOUTH THREE TIMES A DAY NEED OFFICE VISIT, Disp: 270 tablet, Rfl: 1   levocetirizine (XYZAL ) 5 MG tablet, Take 1 tablet (5 mg total) by mouth every evening. For allergies, Disp: 90 tablet, Rfl: 3   levothyroxine  (SYNTHROID ) 50 MCG tablet, TAKE 1 TABLET BY MOUTH EVERY MORNING ON EMPTY STOMACH WITH WATER NO FOOD OR OTHER MEDS FOR , Disp: 90 tablet, Rfl: 2   meloxicam  (MOBIC ) 15 MG tablet, TAKE 1 TABLET (15 MG TOTAL) BY MOUTH DAILY. (Patient taking differently: Take 15 mg by mouth as needed.), Disp: 30 tablet, Rfl: 3   nitroGLYCERIN  (NITROSTAT ) 0.4 MG SL tablet, PLACE 1  TABLET (0.4 MG TOTAL) UNDER THE TONGUE EVERY 5 MINUTES AS NEEDED FOR CHEST PAIN. X 3 DOSES.  Please call [phone] to schedule an appointment for future refills. Thank you., Disp: 75 tablet, Rfl: 0   Polyethyl Glycol-Propyl Glycol (SYSTANE) 0.4-0.3 % SOLN, Place 1 drop into both eyes in the morning, at noon, in the evening, and at bedtime., Disp: , Rfl:    predniSONE  (DELTASONE ) 20 MG tablet, Take 3 tablets by mouth daily in the morning x 3 days, then 2 tablets x 3 days, then 1 tablet x 3 days, Disp: 18 tablet, Rfl: 0  Observations/Objective: Patient is well-developed, well-nourished in no acute distress.  Resting comfortably  at home.  Head is normocephalic, atraumatic.  No labored breathing.  Speech is clear and coherent with logical content.  Patient is alert and oriented at baseline.    Assessment and Plan: 1. Allergic rhinitis, unspecified seasonality, unspecified trigger  2. Environmental and seasonal allergies   Patients present symptoms suspicious for  URI. Differentials include allergic rhinitis,  bacterial pneumonia, sinusitis. Do not suspect underlying cardiopulmonary process. I considered, but think unlikely, dangerous causes of this patient's symptoms to include ACS, CHF or COPD exacerbations, pneumonia, pneumothorax. Patient is nontoxic appearing and not in need of emergent medical intervention.  Plan: reassurance, reassessment, over the counter medications, discharge with PCP follow-up   Follow Up Instructions: I discussed the assessment and treatment plan with the patient. The patient was provided an opportunity to ask questions and all were answered. The patient agreed with the plan and demonstrated an understanding of the instructions.  A copy of instructions were sent to the patient via MyChart unless otherwise noted below.     The patient was advised to call back or seek an in-person evaluation if the symptoms worsen or if the condition fails to improve as  anticipated.    [name], PA-C

## 2024-01-09 DIAGNOSIS — M4306 Spondylolysis, lumbar region: Secondary | ICD-10-CM | POA: Diagnosis not present

## 2024-01-09 DIAGNOSIS — M9905 Segmental and somatic dysfunction of pelvic region: Secondary | ICD-10-CM | POA: Diagnosis not present

## 2024-01-09 DIAGNOSIS — M9903 Segmental and somatic dysfunction of lumbar region: Secondary | ICD-10-CM | POA: Diagnosis not present

## 2024-01-09 DIAGNOSIS — M5431 Sciatica, right side: Secondary | ICD-10-CM | POA: Diagnosis not present

## 2024-01-18 DIAGNOSIS — G4733 Obstructive sleep apnea (adult) (pediatric): Secondary | ICD-10-CM | POA: Diagnosis not present

## 2024-01-30 DIAGNOSIS — M9903 Segmental and somatic dysfunction of lumbar region: Secondary | ICD-10-CM | POA: Diagnosis not present

## 2024-01-30 DIAGNOSIS — M4306 Spondylolysis, lumbar region: Secondary | ICD-10-CM | POA: Diagnosis not present

## 2024-01-30 DIAGNOSIS — M5431 Sciatica, right side: Secondary | ICD-10-CM | POA: Diagnosis not present

## 2024-01-30 DIAGNOSIS — M9905 Segmental and somatic dysfunction of pelvic region: Secondary | ICD-10-CM | POA: Diagnosis not present

## 2024-02-18 DIAGNOSIS — G4733 Obstructive sleep apnea (adult) (pediatric): Secondary | ICD-10-CM | POA: Diagnosis not present

## 2024-02-20 ENCOUNTER — Ambulatory Visit: Payer: BC Managed Care – PPO | Admitting: Internal Medicine

## 2024-02-21 DIAGNOSIS — M9905 Segmental and somatic dysfunction of pelvic region: Secondary | ICD-10-CM | POA: Diagnosis not present

## 2024-02-21 DIAGNOSIS — M5431 Sciatica, right side: Secondary | ICD-10-CM | POA: Diagnosis not present

## 2024-02-21 DIAGNOSIS — M4306 Spondylolysis, lumbar region: Secondary | ICD-10-CM | POA: Diagnosis not present

## 2024-02-21 DIAGNOSIS — M9903 Segmental and somatic dysfunction of lumbar region: Secondary | ICD-10-CM | POA: Diagnosis not present

## 2024-02-22 ENCOUNTER — Encounter: Payer: Self-pay | Admitting: Internal Medicine

## 2024-02-22 ENCOUNTER — Ambulatory Visit: Admitting: Internal Medicine

## 2024-02-22 VITALS — BP 128/60 | HR 109 | Temp 98.5°F | Ht 66.0 in | Wt 181.8 lb

## 2024-02-22 DIAGNOSIS — J45909 Unspecified asthma, uncomplicated: Secondary | ICD-10-CM | POA: Diagnosis not present

## 2024-02-22 DIAGNOSIS — J449 Chronic obstructive pulmonary disease, unspecified: Secondary | ICD-10-CM

## 2024-02-22 DIAGNOSIS — R0982 Postnasal drip: Secondary | ICD-10-CM | POA: Diagnosis not present

## 2024-02-22 MED ORDER — TRELEGY ELLIPTA 100-62.5-25 MCG/ACT IN AEPB: 1.0000 | INHALATION_SPRAY | Freq: Every day | RESPIRATORY_TRACT | Status: DC

## 2024-02-22 NOTE — Progress Notes (Signed)
 Name: Caroline [name] MRN: [id number] DOB: 14-Mar-1961    CHIEF COMPLAINT:  Follow-up assessment for shortness of breath Follow-up assessment for OSA Former smoker Follow-up lung cancer screening program    HISTORY OF PRESENT ILLNESS: Follow up OSA assessment HST performed shows AHI 5.4 Patient has very mild form of OSA will plan to stop CPAP therapy Recommend weight loss  Former smoker quit 2015 1 pack a day for 30 years Patient uses albuterol  as needed Patient does not have any maintenance inhaler therapy But at this time patient has increased shortness of breath using on her albuterol  inhaler twice daily I recommend starting Trelegy inhaler Recommend rinse mouth after use Plan to assess with pulmonary function testing and overnight pulse oximetry  At this time patient has fatigue and headaches Sinus congestion and drainage Recommend ENT referral for assessment  Low-dose CT March 2025 No significant findings Continue with lung cancer screening protocol   PAST MEDICAL HISTORY :   has a past medical history of Adnexal mass (02/21/2020), Allergy, CAD (coronary artery disease), Closed fracture of right humerus with routine healing (02/06/2013), Coronary vasospasm (HCC), Environmental and seasonal allergies, Esophagitis, GERD (gastroesophageal reflux disease), Hyperlipidemia, Hypertension, Hypothyroidism, IBS (irritable bowel syndrome), Palpitations, and Right ovarian cyst.  has a past surgical history that includes Appendectomy (1976); Upper gastrointestinal endoscopy (01/11/2011); left heart catheterization with coronary angiogram (N/A, 09/14/2011); Breast excisional biopsy (Right); Cardiac catheterization (2011); Cardiac catheterization (05/2011); Breast surgery (Right, 2002); Cholecystectomy (1982); Laparoscopic salpingo oophorectomy (Right, 09/18/2020); LEFT HEART CATH AND CORONARY ANGIOGRAPHY (N/A, 10/08/2021); and Intracapsular cataract extraction (Right). Prior to Admission  medications   Medication Sig Start Date End Date Taking? Authorizing Provider  acetaminophen  (TYLENOL ) 500 MG tablet Take 500 mg by mouth every 6 (six) hours as needed. For pain    [provider]  albuterol  (VENTOLIN  HFA) 108 (90 Base) MCG/ACT inhaler Inhale 2 puffs into the lungs every 6 (six) hours as needed. 05/30/23   Clark, Katherine K, NP  aspirin  81 MG tablet Take 81 mg by mouth daily.    [provider]  atorvastatin  (LIPITOR) 20 MG tablet Take 1 tablet (20 mg total) by mouth daily. 05/09/23   Wyn Jackee VEAR Mickey., NP  Cholecalciferol (VITAMIN D ) 2000 units CAPS Take 2000 units once per day. 03/18/16   Burchette, Wolm ORN, MD  Cyanocobalamin (VITAMIN B-12 PO) Take by mouth.    [provider]  diclofenac  Sodium (VOLTAREN ) 1 % GEL Apply 2-4 g topically 4 (four) times daily. 02/14/20   Hilts, Ozell, MD  diltiazem  (TIADYLT  ER) 300 MG 24 hr capsule TAKE 1 CAPSULE BY MOUTH EVERY DAY 05/18/23   Verlin Lonni BIRCH, MD  DULoxetine  (CYMBALTA ) 60 MG capsule Take 1 capsule (60 mg total) by mouth daily. For anxiety, depression, pain 10/21/22   Clark, Katherine K, NP  fluticasone  (FLONASE ) 50 MCG/ACT nasal spray Place 2 sprays into both nostrils daily. Patient taking differently: Place 2 sprays into both nostrils daily as needed for allergies. 04/07/18   Avram Barnie NOVAK, FNP  isosorbide  dinitrate (ISORDIL ) 30 MG tablet TAKE 1 TABLET BY MOUTH THREE TIMES A DAY NEED OFFICE VISIT 05/03/23   Verlin Lonni BIRCH, MD  levocetirizine (XYZAL ) 5 MG tablet Take 1 tablet (5 mg total) by mouth every evening. For allergies 10/21/22   Clark, Katherine K, NP  levothyroxine  (SYNTHROID ) 50 MCG tablet TAKE 1 TABLET BY MOUTH EVERY MORNING ON EMPTY STOMACH WITH WATER NO FOOD OR OTHER MEDS FOR 12/19/22   Gretta Comer POUR, NP  meloxicam  (MOBIC ) 15 MG tablet TAKE 1 TABLET (15 MG TOTAL) BY MOUTH DAILY. 11/18/22   Vernetta Lonni GRADE, MD  nitroGLYCERIN  (NITROSTAT ) 0.4 MG SL tablet PLACE 1  TABLET (0.4 MG TOTAL) UNDER THE TONGUE EVERY 5 MINUTES AS NEEDED FOR CHEST PAIN. X 3 DOSES.  Please call [phone] to schedule an appointment for future refills. Thank you. 08/12/22   Verlin Lonni BIRCH, MD  Polyethyl Glycol-Propyl Glycol (SYSTANE) 0.4-0.3 % SOLN Place 1 drop into both eyes in the morning, at noon, in the evening, and at bedtime.    [provider]   Allergies  Allergen Reactions   Octacosanol Itching    FAMILY HISTORY:  family history includes Anxiety disorder in her son; Breast cancer (age of onset: 103) in her mother; Colon cancer in an other family member; Depression in her son; Diabetes in her mother; Heart disease in her father and mother; Hyperlipidemia in her father; Prostate cancer in her father; Thyroid  disease in her maternal grandmother. SOCIAL HISTORY:  reports that she quit smoking about 9 years ago. Her smoking use included cigarettes. She started smoking about 39 years ago. She has a 7.5 pack-year smoking history. She has never used smokeless tobacco. She reports current alcohol use of about 2.0 standard drinks of alcohol per week. She reports that she does not use drugs.  BP 128/60 (BP Location: Left Arm, Patient Position: Sitting, Cuff Size: Large)   Pulse (!) 109   Temp 98.5 F (36.9 C) (Oral)   Ht 5' 6 (1.676 m)   Wt 181 lb 12.8 oz (82.5 kg)   LMP 07/28/2010   SpO2 96%   BMI 29.34 kg/m     Review of Systems: Gen:  Denies  fever, sweats, chills weight loss  HEENT: Denies blurred vision, double vision, ear pain, eye pain, hearing loss, nose bleeds, sore throat Cardiac:  No dizziness, chest pain or heaviness, chest tightness,edema, No JVD Resp:   No cough, -sputum production, +shortness of breath,-wheezing, -hemoptysis,  Other:  All other systems negative   Physical Examination:   General Appearance: No distress  EYES PERRLA, EOM intact.   NECK Supple, No JVD Pulmonary: normal breath sounds, No wheezing.  CardiovascularNormal  S1,S2.  No m/r/g.   Abdomen: Benign, Soft, non-tender. Neurology UE/LE 5/5 strength, no focal deficits Ext pulses intact, cap refill intact ALL OTHER ROS ARE NEGATIVE     ASSESSMENT AND PLAN SYNOPSIS  64 year old white female seen today for follow-up assessment for OSA  Home sleep study recently obtained shows AHI of 5.4, very mild form of sleep apnea we will plan to stop CPAP at this time and recommend weight loss  Patient with increased shortness of breath and wheezing due to weather however patient with probable underlying diagnosis of COPD   Assessment of COPD Recommend pulmonary function testing Start Trelegy inhaler 1 puff once a day Rinse mouth after use Avoid Allergens and Irritants Avoid secondhand smoke Avoid SICK contacts Recommend  Masking  when appropriate Recommend Keep up-to-date with vaccinations  Allergic rhinitis sinus congestion ENT referral   OSA assessment Very mild form will plan to stop CPAP Recommend weight loss next weight goal is 171 pounds  Shortness of breath and dyspnea on exertion Assess for nocturnal hypoxia with overnight pulse oximetry  Obesity -recommend significant weight loss -recommend changing diet  Deconditioned state -Recommend increased daily activity and exercise  Former smoker low-dose CT scan March 2025 No significant findings follow-up annually  MEDICATION ADJUSTMENTS/LABS AND TESTS ORDERED: PFTs Overnight pulse oximetry  Trelegy Rinse mouth Avoid secondhand smoke Avoid SICK contacts Recommend  Masking  when appropriate Recommend Keep up-to-date with vaccinations Continue lung cancer screening program  CURRENT MEDICATIONS REVIEWED AT LENGTH WITH PATIENT TODAY   Patient  satisfied with Plan of action and management. All questions answered  Follow-up 6 months  Total Time Spent  44 mins   Kaimana Neuzil Alm Cellar, M.D.  [facility]  Medical Director [facility]  Director [facility] Cardio-Pulmonary Department

## 2024-02-22 NOTE — Patient Instructions (Addendum)
 Lets start Trelegy inhaler 1 puff once a day Please rinse mouth after use  Recommend referral to ENT for sinus disease  Lets plan to check oxygen levels at nighttime  Recommend weight loss Lets plan to stop CPAP therapy  Continue lung cancer screening program  Obtain pulmonary function testing  Avoid Allergens and Irritants Avoid secondhand smoke Avoid SICK contacts Recommend  Masking  when appropriate Recommend Keep up-to-date with vaccinations

## 2024-03-16 DIAGNOSIS — J301 Allergic rhinitis due to pollen: Secondary | ICD-10-CM | POA: Diagnosis not present

## 2024-03-16 DIAGNOSIS — J310 Chronic rhinitis: Secondary | ICD-10-CM | POA: Diagnosis not present

## 2024-03-16 DIAGNOSIS — J342 Deviated nasal septum: Secondary | ICD-10-CM | POA: Diagnosis not present

## 2024-03-17 ENCOUNTER — Other Ambulatory Visit: Payer: Self-pay | Admitting: Cardiovascular Disease

## 2024-03-19 DIAGNOSIS — G4733 Obstructive sleep apnea (adult) (pediatric): Secondary | ICD-10-CM | POA: Diagnosis not present

## 2024-03-21 DIAGNOSIS — Z1231 Encounter for screening mammogram for malignant neoplasm of breast: Secondary | ICD-10-CM | POA: Diagnosis not present

## 2024-03-21 LAB — HM MAMMOGRAPHY

## 2024-03-23 ENCOUNTER — Telehealth: Payer: Self-pay | Admitting: Cardiovascular Disease

## 2024-03-23 NOTE — Telephone Encounter (Signed)
*  STAT* If patient is at the pharmacy, call can be transferred to refill team.   1. Which medications need to be refilled? (please list name of each medication and dose if known) diltiazem  (TIADYLT  ER) 300 MG 24 hr capsule    2. Would you like to learn more about the convenience, safety, & potential cost savings by using the [facility]?    3. Are you open to using the [facility] (Type [facility]. ).   4. Which pharmacy/location (including street and city if local pharmacy) is medication to be sent to? [id number] - [place], [place] - [address]    5. Do they need a 30 day or 90 day supply? 90 day Patient scheduled for 06/22/24 with Dr. Verlin.  Only has three pills left.

## 2024-03-26 DIAGNOSIS — M4306 Spondylolysis, lumbar region: Secondary | ICD-10-CM | POA: Diagnosis not present

## 2024-03-26 DIAGNOSIS — M5431 Sciatica, right side: Secondary | ICD-10-CM | POA: Diagnosis not present

## 2024-03-26 DIAGNOSIS — M9905 Segmental and somatic dysfunction of pelvic region: Secondary | ICD-10-CM | POA: Diagnosis not present

## 2024-03-26 DIAGNOSIS — M9903 Segmental and somatic dysfunction of lumbar region: Secondary | ICD-10-CM | POA: Diagnosis not present

## 2024-03-26 MED ORDER — DILTIAZEM HCL ER BEADS 300 MG PO CP24: 300.0000 mg | ORAL_CAPSULE | Freq: Every day | ORAL | 0 refills | Status: DC

## 2024-03-26 NOTE — Telephone Encounter (Signed)
 RX sent to requested Pharmacy

## 2024-03-27 ENCOUNTER — Other Ambulatory Visit: Payer: Self-pay

## 2024-03-27 MED ORDER — NITROGLYCERIN 0.4 MG SL SUBL: SUBLINGUAL_TABLET | SUBLINGUAL | 0 refills | Status: DC

## 2024-03-28 DIAGNOSIS — G4730 Sleep apnea, unspecified: Secondary | ICD-10-CM | POA: Diagnosis not present

## 2024-03-28 DIAGNOSIS — R0902 Hypoxemia: Secondary | ICD-10-CM | POA: Diagnosis not present

## 2024-04-09 DIAGNOSIS — M9905 Segmental and somatic dysfunction of pelvic region: Secondary | ICD-10-CM | POA: Diagnosis not present

## 2024-04-09 DIAGNOSIS — M4306 Spondylolysis, lumbar region: Secondary | ICD-10-CM | POA: Diagnosis not present

## 2024-04-09 DIAGNOSIS — M9903 Segmental and somatic dysfunction of lumbar region: Secondary | ICD-10-CM | POA: Diagnosis not present

## 2024-04-09 DIAGNOSIS — M5431 Sciatica, right side: Secondary | ICD-10-CM | POA: Diagnosis not present

## 2024-04-12 DIAGNOSIS — G4734 Idiopathic sleep related nonobstructive alveolar hypoventilation: Secondary | ICD-10-CM

## 2024-04-18 ENCOUNTER — Telehealth: Payer: Self-pay | Admitting: Internal Medicine

## 2024-04-18 DIAGNOSIS — G4733 Obstructive sleep apnea (adult) (pediatric): Secondary | ICD-10-CM

## 2024-04-18 DIAGNOSIS — G4734 Idiopathic sleep related nonobstructive alveolar hypoventilation: Secondary | ICD-10-CM

## 2024-04-18 NOTE — Telephone Encounter (Signed)
 Dr. Isaiah order ONO to be done or Room Air on 02/22/24 it was done but because the patient is on Cpap Per [name] with [employer] see below Caroline [name]   We need a titrated sleep study done within the last 30days to qualify the patient for O2 since she is on PAP and has OSA.

## 2024-04-18 NOTE — Telephone Encounter (Signed)
 CPAP titration order has been placed.

## 2024-04-19 DIAGNOSIS — G4733 Obstructive sleep apnea (adult) (pediatric): Secondary | ICD-10-CM | POA: Diagnosis not present

## 2024-04-23 DIAGNOSIS — M4306 Spondylolysis, lumbar region: Secondary | ICD-10-CM | POA: Diagnosis not present

## 2024-04-23 DIAGNOSIS — M9903 Segmental and somatic dysfunction of lumbar region: Secondary | ICD-10-CM | POA: Diagnosis not present

## 2024-04-23 DIAGNOSIS — M5431 Sciatica, right side: Secondary | ICD-10-CM | POA: Diagnosis not present

## 2024-04-23 DIAGNOSIS — M9905 Segmental and somatic dysfunction of pelvic region: Secondary | ICD-10-CM | POA: Diagnosis not present

## 2024-04-25 ENCOUNTER — Other Ambulatory Visit: Payer: Self-pay | Admitting: Cardiovascular Disease

## 2024-04-26 ENCOUNTER — Encounter: Payer: Self-pay | Admitting: Internal Medicine

## 2024-04-26 ENCOUNTER — Ambulatory Visit (INDEPENDENT_AMBULATORY_CARE_PROVIDER_SITE_OTHER): Admitting: Internal Medicine

## 2024-04-26 VITALS — BP 140/80 | HR 105 | Temp 98.0°F | Ht 63.0 in | Wt 181.2 lb

## 2024-04-26 DIAGNOSIS — F419 Anxiety disorder, unspecified: Secondary | ICD-10-CM | POA: Insufficient documentation

## 2024-04-26 DIAGNOSIS — G4734 Idiopathic sleep related nonobstructive alveolar hypoventilation: Secondary | ICD-10-CM | POA: Diagnosis not present

## 2024-04-26 DIAGNOSIS — J45909 Unspecified asthma, uncomplicated: Secondary | ICD-10-CM

## 2024-04-26 DIAGNOSIS — M1990 Unspecified osteoarthritis, unspecified site: Secondary | ICD-10-CM | POA: Insufficient documentation

## 2024-04-26 MED ORDER — TRELEGY ELLIPTA 100-62.5-25 MCG/ACT IN AEPB: 1.0000 | INHALATION_SPRAY | Freq: Every day | RESPIRATORY_TRACT | 3 refills | Status: AC

## 2024-04-26 MED ORDER — TRELEGY ELLIPTA 100-62.5-25 MCG/ACT IN AEPB: 1.0000 | INHALATION_SPRAY | Freq: Every day | RESPIRATORY_TRACT | Status: DC

## 2024-04-26 NOTE — Progress Notes (Unsigned)
 Name: Caroline [name] MRN: [id number] DOB: 07/30/1961    CHIEF COMPLAINT:  Follow-up assessment for shortness of breath Follow-up assessment for nocturnal hypoxia Follow-up assessment lung cancer screening program    HISTORY OF PRESENT ILLNESS: Previous OSA assessment HST performed shows AHI 5.4 Patient has very mild form of OSA will plan to stop CPAP therapy Recommend weight loss  Former smoker quit 2015 1 pack a day for 30 years Patient uses albuterol  as needed Patient does not have any maintenance inhaler therapy Patient started on Trelegy inhaler last office visit  Assessment of lung cancer screening Low-dose CT March 2025 No significant findings Continue with lung cancer screening protocol  Overnight pulse oximetry obtained on March 29, 2024 showed O2 sats less than 88% approximately 16 minutes I do recommend starting 1 L of oxygen at night  PAST MEDICAL HISTORY :   has a past medical history of Adnexal mass (02/21/2020), Allergy, CAD (coronary artery disease), Closed fracture of right humerus with routine healing (02/06/2013), Coronary vasospasm (HCC), Environmental and seasonal allergies, Esophagitis, GERD (gastroesophageal reflux disease), Hyperlipidemia, Hypertension, Hypothyroidism, IBS (irritable bowel syndrome), Palpitations, and Right ovarian cyst.  has a past surgical history that includes Appendectomy (1976); Upper gastrointestinal endoscopy (01/11/2011); left heart catheterization with coronary angiogram (N/A, 09/14/2011); Breast excisional biopsy (Right); Cardiac catheterization (2011); Cardiac catheterization (05/2011); Breast surgery (Right, 2002); Cholecystectomy (1982); Laparoscopic salpingo oophorectomy (Right, 09/18/2020); LEFT HEART CATH AND CORONARY ANGIOGRAPHY (N/A, 10/08/2021); and Intracapsular cataract extraction (Right). Prior to Admission medications   Medication Sig Start Date End Date Taking? Authorizing Provider  acetaminophen  (TYLENOL ) 500 MG  tablet Take 500 mg by mouth every 6 (six) hours as needed. For pain    [provider]  albuterol  (VENTOLIN  HFA) 108 (90 Base) MCG/ACT inhaler Inhale 2 puffs into the lungs every 6 (six) hours as needed. 05/30/23   Clark, Katherine K, NP  aspirin  81 MG tablet Take 81 mg by mouth daily.    [provider]  atorvastatin  (LIPITOR) 20 MG tablet Take 1 tablet (20 mg total) by mouth daily. 05/09/23   Wyn Jackee VEAR Mickey., NP  Cholecalciferol (VITAMIN D ) 2000 units CAPS Take 2000 units once per day. 03/18/16   Burchette, Wolm ORN, MD  Cyanocobalamin (VITAMIN B-12 PO) Take by mouth.    [provider]  diclofenac  Sodium (VOLTAREN ) 1 % GEL Apply 2-4 g topically 4 (four) times daily. 02/14/20   Hilts, Michael, MD  diltiazem  (TIADYLT  ER) 300 MG 24 hr capsule TAKE 1 CAPSULE BY MOUTH EVERY DAY 05/18/23   Verlin Lonni BIRCH, MD  DULoxetine  (CYMBALTA ) 60 MG capsule Take 1 capsule (60 mg total) by mouth daily. For anxiety, depression, pain 10/21/22   Clark, Katherine K, NP  fluticasone  (FLONASE ) 50 MCG/ACT nasal spray Place 2 sprays into both nostrils daily. Patient taking differently: Place 2 sprays into both nostrils daily as needed for allergies. 04/07/18   Avram Barnie NOVAK, FNP  isosorbide  dinitrate (ISORDIL ) 30 MG tablet TAKE 1 TABLET BY MOUTH THREE TIMES A DAY NEED OFFICE VISIT 05/03/23   Verlin Lonni BIRCH, MD  levocetirizine (XYZAL ) 5 MG tablet Take 1 tablet (5 mg total) by mouth every evening. For allergies 10/21/22   Clark, Katherine K, NP  levothyroxine  (SYNTHROID ) 50 MCG tablet TAKE 1 TABLET BY MOUTH EVERY MORNING ON EMPTY STOMACH WITH WATER NO FOOD OR OTHER MEDS FOR 12/19/22   Gretta Comer POUR, NP  meloxicam  (MOBIC ) 15 MG tablet TAKE 1 TABLET (15 MG TOTAL) BY MOUTH DAILY. 11/18/22  Vernetta Lonni GRADE, MD  nitroGLYCERIN  (NITROSTAT ) 0.4 MG SL tablet PLACE 1 TABLET (0.4 MG TOTAL) UNDER THE TONGUE EVERY 5 MINUTES AS NEEDED FOR CHEST PAIN. X 3 DOSES.  Please call  [phone] to schedule an appointment for future refills. Thank you. 08/12/22   Verlin Lonni BIRCH, MD  Polyethyl Glycol-Propyl Glycol (SYSTANE) 0.4-0.3 % SOLN Place 1 drop into both eyes in the morning, at noon, in the evening, and at bedtime.    [provider]   Allergies  Allergen Reactions   Octacosanol Itching    FAMILY HISTORY:  family history includes Anxiety disorder in her son; Breast cancer (age of onset: 37) in her mother; Colon cancer in an other family member; Depression in her son; Diabetes in her mother; Heart disease in her father and mother; Hyperlipidemia in her father; Prostate cancer in her father; Thyroid  disease in her maternal grandmother. SOCIAL HISTORY:  reports that she quit smoking about 9 years ago. Her smoking use included cigarettes. She started smoking about 39 years ago. She has a 7.5 pack-year smoking history. She has never used smokeless tobacco. She reports current alcohol use of about 2.0 standard drinks of alcohol per week. She reports that she does not use drugs.  LMP 07/28/2010     BP (!) 140/80   Pulse (!) 105   Temp 98 F (36.7 C)   Ht 5' 3 (1.6 m)   Wt 181 lb 3.2 oz (82.2 kg)   LMP 07/28/2010   SpO2 95%   BMI 32.10 kg/m    Review of Systems: Gen:  Denies  fever, sweats, chills weight loss  HEENT: Denies blurred vision, double vision, ear pain, eye pain, hearing loss, nose bleeds, sore throat Cardiac:  No dizziness, chest pain or heaviness, chest tightness,edema, No JVD Resp:   No cough, -sputum production, -shortness of breath,-wheezing, -hemoptysis,  Other:  All other systems negative   Physical Examination:   General Appearance: No distress  EYES PERRLA, EOM intact.   NECK Supple, No JVD Pulmonary: normal breath sounds, No wheezing.  CardiovascularNormal S1,S2.  No m/r/g.   Abdomen: Benign, Soft, non-tender. Neurology UE/LE 5/5 strength, no focal deficits Ext pulses intact, cap refill intact ALL OTHER ROS ARE  NEGATIVE     ASSESSMENT AND PLAN SYNOPSIS  63 year old white female seen today for follow-up assessment for OSA  Home sleep study recently obtained shows AHI of 5.4, very mild form of sleep apnea we will plan to stop CPAP at this time and recommend weight loss  Patient with increased shortness of breath and wheezing due to weather however patient with probable underlying diagnosis of COPD  Pulmonary function testing pending    Assessment of COPD Recommend pulmonary function testing Continue Trelegy patient is using it as needed Rinse mouth after use Avoid Allergens and Irritants Avoid secondhand smoke Avoid SICK contacts Recommend  Masking  when appropriate Recommend Keep up-to-date with vaccinations   Allergic rhinitis sinus congestion ENT referral-no nasal polyps however allergy testing is positive for dog dander Patient to undergo further evaluation Patient using  Astelin  nasal sprays which seem to be helping   OSA assessment Very mild form will plan to stop CPAP Recommend weight loss next weight goal is 171 pounds  Shortness of breath and dyspnea on exertion Seems to be improving however will reassess with overnight pulse oximetry  Obesity -recommend significant weight loss -recommend changing diet  Deconditioned state -Recommend increased daily activity and exercise   Former smoker low-dose CT scan March 2025  No significant findings follow-up annually  MEDICATION ADJUSTMENTS/LABS AND TESTS ORDERED: PFTs Overnight pulse oximetry Trelegy as needed Rinse mouth Avoid Allergens and Irritants Avoid secondhand smoke Avoid SICK contacts Recommend  Masking  when appropriate Recommend Keep up-to-date with vaccinations  Continue lung cancer screening program   CURRENT MEDICATIONS REVIEWED AT LENGTH WITH PATIENT TODAY   Patient  satisfied with Plan of action and management. All questions answered   Follow up 3 months   I spent a total of 41 minutes  dedicated to the care of this patient on the date of this encounter to include pre-visit review of records, face-to-face time with the patient discussing conditions above, post visit ordering of testing, clinical documentation with the electronic health record, making appropriate referrals as documented, and communicating necessary information to the patient's healthcare team.    The Patient requires high complexity decision making for assessment and support, frequent evaluation and titration of therapies, application of advanced monitoring technologies and extensive interpretation of multiple databases.  Patient satisfied with Plan of action and management. All questions answered    Nickolas Alm Cellar, M.D.  [facility]  Medical Director [facility] Medical Director [facility] Cardio-Pulmonary Department

## 2024-04-26 NOTE — Telephone Encounter (Signed)
 Pt on schedule for today.

## 2024-04-26 NOTE — Telephone Encounter (Signed)
 Pt coming in today to review ONO results.

## 2024-04-26 NOTE — Patient Instructions (Addendum)
 Great job on [employer] and exercise!! Continue Trelegy as needed Please rinse mouth after every use  Recommend reassessing your oxygen levels at night with overnight pulse oximetry  Follow-up with ENT for allergies  Follow-up breathing test in October  Avoid Allergens and Irritants Avoid secondhand smoke Avoid SICK contacts Recommend  Masking  when appropriate Recommend Keep up-to-date with vaccinations

## 2024-04-27 ENCOUNTER — Encounter: Payer: Self-pay | Admitting: Internal Medicine

## 2024-05-06 ENCOUNTER — Other Ambulatory Visit: Payer: Self-pay | Admitting: Nurse Practitioner

## 2024-05-07 DIAGNOSIS — R0902 Hypoxemia: Secondary | ICD-10-CM | POA: Diagnosis not present

## 2024-05-07 DIAGNOSIS — G4730 Sleep apnea, unspecified: Secondary | ICD-10-CM | POA: Diagnosis not present

## 2024-05-09 ENCOUNTER — Encounter: Payer: Self-pay | Admitting: Internal Medicine

## 2024-05-10 ENCOUNTER — Ambulatory Visit: Payer: Self-pay | Admitting: Internal Medicine

## 2024-05-11 ENCOUNTER — Encounter: Payer: Self-pay | Admitting: Primary Care

## 2024-05-11 ENCOUNTER — Ambulatory Visit: Payer: Self-pay | Admitting: Primary Care

## 2024-05-11 ENCOUNTER — Ambulatory Visit: Admitting: Primary Care

## 2024-05-11 VITALS — BP 138/82 | HR 90 | Temp 97.8°F | Ht 63.0 in | Wt 181.0 lb

## 2024-05-11 DIAGNOSIS — E1165 Type 2 diabetes mellitus with hyperglycemia: Secondary | ICD-10-CM

## 2024-05-11 DIAGNOSIS — E6609 Other obesity due to excess calories: Secondary | ICD-10-CM

## 2024-05-11 DIAGNOSIS — I2510 Atherosclerotic heart disease of native coronary artery without angina pectoris: Secondary | ICD-10-CM | POA: Diagnosis not present

## 2024-05-11 DIAGNOSIS — R7303 Prediabetes: Secondary | ICD-10-CM | POA: Diagnosis not present

## 2024-05-11 DIAGNOSIS — E039 Hypothyroidism, unspecified: Secondary | ICD-10-CM

## 2024-05-11 DIAGNOSIS — E66811 Obesity, class 1: Secondary | ICD-10-CM | POA: Diagnosis not present

## 2024-05-11 DIAGNOSIS — I10 Essential (primary) hypertension: Secondary | ICD-10-CM

## 2024-05-11 DIAGNOSIS — E785 Hyperlipidemia, unspecified: Secondary | ICD-10-CM

## 2024-05-11 DIAGNOSIS — G4733 Obstructive sleep apnea (adult) (pediatric): Secondary | ICD-10-CM

## 2024-05-11 DIAGNOSIS — Z6832 Body mass index (BMI) 32.0-32.9, adult: Secondary | ICD-10-CM

## 2024-05-11 LAB — T4, FREE: Free T4: 0.72 ng/dL (ref 0.60–1.60)

## 2024-05-11 LAB — T3, FREE: T3, Free: 2.9 pg/mL (ref 2.3–4.2)

## 2024-05-11 LAB — TSH: TSH: 1.63 u[IU]/mL (ref 0.35–5.50)

## 2024-05-11 LAB — HEMOGLOBIN A1C: Hgb A1c MFr Bld: 6.5 % (ref 4.6–6.5)

## 2024-05-11 NOTE — Assessment & Plan Note (Signed)
 Above goal today, improved on recheck  Will have her monitor her blood pressure at home and report if readings are consistently at or above 140/90.

## 2024-05-11 NOTE — Assessment & Plan Note (Signed)
 Repeat A1c pending. Commended her on dietary changes.

## 2024-05-11 NOTE — Patient Instructions (Addendum)
 Stop by the lab prior to leaving today. I will notify you of your results once received.   You will either be contacted via phone regarding your referral to healthy weight and wellness, or you may receive a letter on your MyChart portal from our referral team with instructions for scheduling an appointment. Please let us  know if you have not been contacted by anyone within two weeks.  Monitor your blood pressure and notify if ECV is consistently at or above 140 on top and/or 90 on bottom  Schedule your physical for early March 2026.  It was a pleasure to see you today!

## 2024-05-11 NOTE — Assessment & Plan Note (Signed)
 She is taking levothyroxine  correctly.  Continue levothyroxine  50 mcg tablets daily. TSH pending.  Will add free T3 and free T4 per patient request.

## 2024-05-11 NOTE — Progress Notes (Signed)
 Subjective:    Patient ID: Caroline [name], female    DOB: Jan 07, 1961, 63 y.o.   MRN: [id number]  Caroline [name] is a very pleasant 63 y.o. female with a history of CAD, hypertension, asthma, OSA, hypothyroidism, osteoarthritis, fibromyalgia, prediabetes who presents today to discuss thyroid .   She is currently managed on levothyroxine  50 mcg for hypothyroidism. Over the last 4-5 months she's noticed hair loss, especially in the shower. She's also noticed brittle and cracking nails.   She was challenged by her pulmonologist to lose 10 pounds. For 1 month she cut back on dairy, carbs, sugar. She was mostly eating grilled chicken and fist, salad, veggies and she gained 1 pound. She continues to try these practices. She's discouraged by her lack of results in 1 month.   Diet currently consists of:  Breakfast: Wheat toast with yogurt based spread Lunch: Left overs Dinner: Pasta, grilled chicken, fish, veggies (corn, zucchini, green beans, okra, carrots, tomatoes, cucumber) Snacks: Carrots with ranch dressing, fruit, peanut butter on crackers Desserts: None Beverages: Water, carrot juice, milk, diet green tea   Exercise: Walking   Wt Readings from Last 3 Encounters:  05/11/24 181 lb (82.1 kg)  04/26/24 181 lb 3.2 oz (82.2 kg)  02/22/24 181 lb 12.8 oz (82.5 kg)   BP Readings from Last 3 Encounters:  05/11/24 138/82  04/26/24 (!) 140/80  02/22/24 128/60      Review of Systems  Respiratory:  Negative for shortness of breath.   Cardiovascular:  Negative for chest pain.  Musculoskeletal:  Positive for back pain.  Neurological:  Positive for headaches.         Past Medical History:  Diagnosis Date   Adnexal mass 02/21/2020   Allergy    Seasonal   CAD (coronary artery disease)    has documented coronary vasospasm but with underlying 40% prox LAD, 30% prox LCX and 70 to 80% proximal 1st DX and 30% RCA; recurrent vasospasm with subsequent cath in Jan 2013.    Closed fracture of  right humerus with routine healing 02/06/2013   Coronary vasospasm (HCC)    Environmental and seasonal allergies    Esophagitis    GERD (gastroesophageal reflux disease)    Hyperlipidemia    Hypertension    Hypothyroidism    IBS (irritable bowel syndrome)    Palpitations    tachycardia on holter; treated with beta blockers   Right ovarian cyst     Social History   Socioeconomic History   Marital status: Married    Spouse name: Not on file   Number of children: 2   Years of education: Not on file   Highest education level: Associate degree: occupational, Scientist, product/process development, or vocational program  Occupational History   Occupation: Unemployed  Tobacco Use   Smoking status: Former    Current packs/day: 0.00    Average packs/day: 0.3 packs/day for 30.0 years (7.5 ttl pk-yrs)    Types: Cigarettes    Start date: 05/30/1984    Quit date: 05/30/2014    Years since quitting: 9.9   Smokeless tobacco: Never  Vaping Use   Vaping status: Never Used  Substance and Sexual Activity   Alcohol use: Yes    Alcohol/week: 2.0 standard drinks of alcohol    Types: 2 Standard drinks or equivalent per week    Comment: occ   Drug use: No   Sexual activity: Yes  Other Topics Concern   Not on file  Social History Narrative   Married.   2 children.  Retired, once worked in Chief Financial Officer.   She enjoys being with her dogs, shopping.    Social Drivers of Corporate investment banker Strain: Low Risk  (05/10/2024)   Overall Financial Resource Strain (CARDIA)    Difficulty of Paying Living Expenses: Not hard at all  Food Insecurity: No Food Insecurity (05/10/2024)   Hunger Vital Sign    Worried About Running Out of Food in the Last Year: Never true    Ran Out of Food in the Last Year: Never true  Transportation Needs: No Transportation Needs (05/10/2024)   PRAPARE - Administrator, Civil Service (Medical): No    Lack of Transportation (Non-Medical): No  Physical Activity: Sufficiently Active  (05/10/2024)   Exercise Vital Sign    Days of Exercise per Week: 5 days    Minutes of Exercise per Session: 30 min  Stress: No Stress Concern Present (05/10/2024)   [employer] of Occupational Health - Occupational Stress Questionnaire    Feeling of Stress: Not at all  Social Connections: Unknown (05/10/2024)   Social Connection and Isolation Panel    Frequency of Communication with Friends and Family: Once a week    Frequency of Social Gatherings with Friends and Family: Patient declined    Attends Religious Services: Patient declined    Database administrator or Organizations: No    Attends Engineer, structural: Not on file    Marital Status: Married  Catering manager Violence: Not on file    Past Surgical History:  Procedure Laterality Date   APPENDECTOMY  1976   BREAST EXCISIONAL BIOPSY Right    benign   BREAST SURGERY Right 2002   biopsy   CARDIAC CATHETERIZATION  2011   CARDIAC CATHETERIZATION  05/2011   With documented vasospasm but with residual disease; managed medically   CHOLECYSTECTOMY  1982   open   INTRACAPSULAR CATARACT EXTRACTION Right    LAPAROSCOPIC SALPINGO OOPHERECTOMY Right 09/18/2020   Procedure: LAPAROSCOPIC SALPINGO OOPHORECTOMY;  Surgeon: Kandyce Sor, MD;  Location: [facility];  Service: Gynecology;  Laterality: Right;   LEFT HEART CATH AND CORONARY ANGIOGRAPHY N/A 10/08/2021   Procedure: LEFT HEART CATH AND CORONARY ANGIOGRAPHY;  Surgeon: Verlin Lonni BIRCH, MD;  Location: [facility] INVASIVE CV LAB;  Service: Cardiovascular;  Laterality: N/A;   LEFT HEART CATHETERIZATION WITH CORONARY ANGIOGRAM N/A 09/14/2011   Procedure: LEFT HEART CATHETERIZATION WITH CORONARY ANGIOGRAM;  Surgeon: Lonni BIRCH Verlin, MD;  Location: [facility] CATH LAB;  Service: Cardiovascular;  Laterality: N/A;   UPPER GASTROINTESTINAL ENDOSCOPY  01/11/2011   esophagitis    Family History  Problem Relation Age of Onset   Breast cancer Mother 55   Heart  disease Mother        died CHF 62   Diabetes Mother    Prostate cancer Father    Hyperlipidemia Father    Heart disease Father        s/p cabg - alive   Colon cancer Other        First cousin on dads side    Thyroid  disease Maternal Grandmother    Anxiety disorder Son    Depression Son     Allergies  Allergen Reactions   Octacosanol Itching    Current Outpatient Medications on File Prior to Visit  Medication Sig Dispense Refill   acetaminophen  (TYLENOL ) 500 MG tablet Take 500 mg by mouth every 6 (six) hours as needed. For pain     albuterol  (VENTOLIN  HFA) 108 (90 Base) MCG/ACT  inhaler Inhale 2 puffs into the lungs every 6 (six) hours as needed. 8.5 each 11   aspirin  81 MG tablet Take 81 mg by mouth daily.     atorvastatin  (LIPITOR) 20 MG tablet Take 1 tablet (20 mg total) by mouth daily. 90 tablet 3   azelastine  (ASTELIN ) 0.1 % nasal spray Place 2 sprays into both nostrils 2 (two) times daily. Use in each nostril as directed 30 mL 12   Cholecalciferol (VITAMIN D ) 2000 units CAPS Take 2000 units once per day. 30 capsule    clobetasol cream (TEMOVATE) 0.05 % Apply 1 Application topically 2 (two) times daily.     diclofenac  Sodium (VOLTAREN ) 1 % GEL Apply 2-4 g topically 4 (four) times daily. 200 g 2   diltiazem  (TIADYLT  ER) 300 MG 24 hr capsule Take 1 capsule (300 mg total) by mouth daily. 90 capsule 0   DULoxetine  (CYMBALTA ) 60 MG capsule TAKE 1 CAPSULE (60 MG TOTAL) BY MOUTH DAILY. FOR ANXIETY, DEPRESSION, PAIN 90 capsule 2   Fluticasone -Umeclidin-Vilant (TRELEGY ELLIPTA ) 100-62.5-25 MCG/ACT AEPB Inhale 1 puff into the lungs daily. 28 each 3   isosorbide  dinitrate (ISORDIL ) 30 MG tablet Take 1 tablet (30 mg total) by mouth 3 (three) times daily. 270 tablet 0   levocetirizine (XYZAL ) 5 MG tablet Take 1 tablet (5 mg total) by mouth every evening. For allergies 90 tablet 3   levothyroxine  (SYNTHROID ) 50 MCG tablet TAKE 1 TABLET BY MOUTH EVERY MORNING ON EMPTY STOMACH WITH WATER NO FOOD  OR OTHER MEDS FOR 90 tablet 2   nitroGLYCERIN  (NITROSTAT ) 0.4 MG SL tablet PLACE 1 TABLET (0.4 MG TOTAL) UNDER THE TONGUE EVERY 5 MINUTES AS NEEDED FOR CHEST PAIN. X 3 DOSES. PLEASE CALL [phone] TO SCHEDULE AN APPOINTMENT FOR FUTURE REFILLS. THANK YOU. 25 tablet 1   Polyethyl Glycol-Propyl Glycol (SYSTANE) 0.4-0.3 % SOLN Place 1 drop into both eyes in the morning, at noon, in the evening, and at bedtime.     benzonatate  (TESSALON ) 100 MG capsule Take 1 capsule (100 mg total) by mouth 2 (two) times daily as needed for cough. (Patient not taking: Reported on 05/11/2024) 20 capsule 0   Cyanocobalamin (VITAMIN B-12 PO) Take by mouth. (Patient not taking: Reported on 05/11/2024)     cyclobenzaprine  (FLEXERIL ) 5 MG tablet Take 1-2 tablets (5-10 mg total) by mouth 3 (three) times daily as needed. (Patient not taking: Reported on 05/11/2024) 15 tablet 0   fluticasone  (FLONASE ) 50 MCG/ACT nasal spray Place 2 sprays into both nostrils daily. (Patient not taking: Reported on 05/11/2024) 16 g 6   meloxicam  (MOBIC ) 15 MG tablet TAKE 1 TABLET (15 MG TOTAL) BY MOUTH DAILY. (Patient not taking: Reported on 05/11/2024) 30 tablet 3   No current facility-administered medications on file prior to visit.    BP 138/82   Pulse 90   Temp 97.8 F (36.6 C) (Temporal)   Ht 5' 3 (1.6 m)   Wt 181 lb (82.1 kg)   LMP 07/28/2010   SpO2 98%   BMI 32.06 kg/m  Objective:   Physical Exam Cardiovascular:     Rate and Rhythm: Normal rate and regular rhythm.  Pulmonary:     Effort: Pulmonary effort is normal.     Breath sounds: Normal breath sounds.  Musculoskeletal:     Cervical back: Neck supple.  Skin:    General: Skin is warm and dry.  Neurological:     Mental Status: She is alert and oriented to person, place, and time.  Psychiatric:        Mood and Affect: Mood normal.     Physical Exam        Assessment & Plan:  Prediabetes Assessment & Plan: Repeat A1c pending. Commended her on dietary  changes.  Orders: -     Hemoglobin A1c -     Amb Ref to Medical Weight Management  Acquired hypothyroidism Assessment & Plan: She is taking levothyroxine  correctly.  Continue levothyroxine  50 mcg tablets daily. TSH pending.  Will add free T3 and free T4 per patient request.  Orders: -     TSH -     T4, free -     T3, free  Class 1 obesity due to excess calories with serious comorbidity and body mass index (BMI) of 32.0 to 32.9 in adult Assessment & Plan: Commended her regarding lifestyle and dietary changes.  Referral placed for healthy weight wellness center referral.  Orders: -     Amb Ref to Medical Weight Management  Coronary artery disease involving native coronary artery of native heart without angina pectoris -     Amb Ref to Medical Weight Management  OSA (obstructive sleep apnea) -     Amb Ref to Medical Weight Management  Dyslipidemia -     Amb Ref to Medical Weight Management  Primary hypertension Assessment & Plan: Above goal today, improved on recheck  Will have her monitor her blood pressure at home and report if readings are consistently at or above 140/90.  Orders: -     Amb Ref to Medical Weight Management    Assessment and Plan Assessment & Plan         Comer MARLA Gaskins, NP      History of Present Illness

## 2024-05-11 NOTE — Assessment & Plan Note (Signed)
 Commended her regarding lifestyle and dietary changes.  Referral placed for healthy weight wellness center referral.

## 2024-05-14 DIAGNOSIS — M9905 Segmental and somatic dysfunction of pelvic region: Secondary | ICD-10-CM | POA: Diagnosis not present

## 2024-05-14 DIAGNOSIS — M9903 Segmental and somatic dysfunction of lumbar region: Secondary | ICD-10-CM | POA: Diagnosis not present

## 2024-05-14 DIAGNOSIS — M4306 Spondylolysis, lumbar region: Secondary | ICD-10-CM | POA: Diagnosis not present

## 2024-05-14 DIAGNOSIS — M5431 Sciatica, right side: Secondary | ICD-10-CM | POA: Diagnosis not present

## 2024-05-14 MED ORDER — OZEMPIC (0.25 OR 0.5 MG/DOSE) 2 MG/3ML ~~LOC~~ SOPN: PEN_INJECTOR | SUBCUTANEOUS | 0 refills | Status: DC

## 2024-05-15 ENCOUNTER — Telehealth: Payer: Self-pay

## 2024-05-15 ENCOUNTER — Other Ambulatory Visit (HOSPITAL_COMMUNITY): Payer: Self-pay

## 2024-05-15 NOTE — Telephone Encounter (Signed)
 Pharmacy Patient Advocate Encounter   Received notification from Onbase that prior authorization for Ozempic  2 is required/requested.   Insurance verification completed.   The patient is insured through [facility] .   Per test claim: PA required; PA submitted to above mentioned insurance via Latent Key/confirmation #/EOC [id number] Status is pending

## 2024-05-16 ENCOUNTER — Other Ambulatory Visit (HOSPITAL_COMMUNITY): Payer: Self-pay

## 2024-05-16 NOTE — Telephone Encounter (Signed)
 Pharmacy Patient Advocate Encounter  Received notification from [facility] that Prior Authorization for Ozempic  2 has been APPROVED from 05/15/24 to 05/15/25. Unable to obtain price due to refill too soon rejection, last fill date 05/16/24 next available fill date10/13/25   PA #/Case ID/Reference #: # [id number]

## 2024-05-30 ENCOUNTER — Encounter (INDEPENDENT_AMBULATORY_CARE_PROVIDER_SITE_OTHER): Payer: Self-pay

## 2024-06-04 DIAGNOSIS — M9903 Segmental and somatic dysfunction of lumbar region: Secondary | ICD-10-CM | POA: Diagnosis not present

## 2024-06-04 DIAGNOSIS — M4306 Spondylolysis, lumbar region: Secondary | ICD-10-CM | POA: Diagnosis not present

## 2024-06-04 DIAGNOSIS — M5431 Sciatica, right side: Secondary | ICD-10-CM | POA: Diagnosis not present

## 2024-06-04 DIAGNOSIS — M9905 Segmental and somatic dysfunction of pelvic region: Secondary | ICD-10-CM | POA: Diagnosis not present

## 2024-06-22 ENCOUNTER — Ambulatory Visit: Admitting: Cardiovascular Disease

## 2024-06-23 ENCOUNTER — Other Ambulatory Visit: Payer: Self-pay | Admitting: Cardiovascular Disease

## 2024-06-24 ENCOUNTER — Other Ambulatory Visit: Payer: Self-pay | Admitting: Cardiovascular Disease

## 2024-06-25 DIAGNOSIS — Z713 Dietary counseling and surveillance: Secondary | ICD-10-CM | POA: Diagnosis not present

## 2024-07-02 DIAGNOSIS — M9905 Segmental and somatic dysfunction of pelvic region: Secondary | ICD-10-CM | POA: Diagnosis not present

## 2024-07-02 DIAGNOSIS — M5431 Sciatica, right side: Secondary | ICD-10-CM | POA: Diagnosis not present

## 2024-07-02 DIAGNOSIS — M9903 Segmental and somatic dysfunction of lumbar region: Secondary | ICD-10-CM | POA: Diagnosis not present

## 2024-07-02 DIAGNOSIS — M4306 Spondylolysis, lumbar region: Secondary | ICD-10-CM | POA: Diagnosis not present

## 2024-07-10 DIAGNOSIS — Z713 Dietary counseling and surveillance: Secondary | ICD-10-CM | POA: Diagnosis not present

## 2024-07-12 ENCOUNTER — Telehealth: Payer: Self-pay | Admitting: Cardiovascular Disease

## 2024-07-12 MED ORDER — ISOSORBIDE DINITRATE 30 MG PO TABS: 30.0000 mg | ORAL_TABLET | Freq: Three times a day (TID) | ORAL | 0 refills | Status: DC

## 2024-07-12 MED ORDER — ATORVASTATIN CALCIUM 20 MG PO TABS: 20.0000 mg | ORAL_TABLET | Freq: Every day | ORAL | 0 refills | Status: DC

## 2024-07-12 NOTE — Telephone Encounter (Signed)
*  STAT* If patient is at the pharmacy, call can be transferred to refill team.   1. Which medications need to be refilled? (please list name of each medication and dose if known)   atorvastatin  (LIPITOR) 20 MG tablet  isosorbide  dinitrate (ISORDIL ) 30 MG tablet   2. Would you like to learn more about the convenience, safety, & potential cost savings by using the [facility]?   3. Are you open to using the [facility] (Type [facility]. ).  4. Which pharmacy/location (including street and city if local pharmacy) is medication to be sent to?  [id number] - [place], [place] - [address]   5. Do they need a 30 day or 90 day supply?   Patient still has some medication but stated she will run out before her appointment scheduled on 1/19 with Dr. Verlin.

## 2024-07-12 NOTE — Telephone Encounter (Signed)
 Pt's medications were sent to pt's pharmacy as requested. Confirmation received.

## 2024-07-18 DIAGNOSIS — E1165 Type 2 diabetes mellitus with hyperglycemia: Secondary | ICD-10-CM

## 2024-07-25 ENCOUNTER — Other Ambulatory Visit: Payer: Self-pay | Admitting: Primary Care

## 2024-07-25 DIAGNOSIS — E1165 Type 2 diabetes mellitus with hyperglycemia: Secondary | ICD-10-CM

## 2024-07-25 NOTE — Telephone Encounter (Unsigned)
 Copied from CRM #[id number]. Topic: Clinical - Medication Refill >> Jul 25, 2024  3:44 PM Ashley R wrote: Medication: Semaglutide ,0.25 or 0.5MG /DOS, (OZEMPIC , 0.25 OR 0.5 MG/DOSE,) 2 MG/3ML SOPN  Has the patient contacted their pharmacy? Yes  This is the patient's preferred pharmacy:  [facility] [phone] [facility] [place] - [address] [place] [address] Phone: [phone] Fax: [phone]  Is this the correct pharmacy for this prescription? Yes If no, delete pharmacy and type the correct one.   Has the prescription been filled recently? Yes  Is the patient out of the medication? Yes  Has the patient been seen for an appointment in the last year OR does the patient have an upcoming appointment? Yes  Can we respond through MyChart? Yes  Agent: Please be advised that Rx refills may take up to 3 business days. We ask that you follow-up with your pharmacy.

## 2024-07-30 ENCOUNTER — Other Ambulatory Visit: Payer: Self-pay | Admitting: Primary Care

## 2024-07-30 DIAGNOSIS — M5431 Sciatica, right side: Secondary | ICD-10-CM | POA: Diagnosis not present

## 2024-07-30 DIAGNOSIS — M9903 Segmental and somatic dysfunction of lumbar region: Secondary | ICD-10-CM | POA: Diagnosis not present

## 2024-07-30 DIAGNOSIS — E1165 Type 2 diabetes mellitus with hyperglycemia: Secondary | ICD-10-CM

## 2024-07-30 DIAGNOSIS — M4306 Spondylolysis, lumbar region: Secondary | ICD-10-CM | POA: Diagnosis not present

## 2024-07-30 DIAGNOSIS — M9905 Segmental and somatic dysfunction of pelvic region: Secondary | ICD-10-CM | POA: Diagnosis not present

## 2024-07-30 MED ORDER — OZEMPIC (0.25 OR 0.5 MG/DOSE) 2 MG/3ML ~~LOC~~ SOPN: 0.5000 mg | PEN_INJECTOR | SUBCUTANEOUS | 0 refills | Status: DC

## 2024-07-30 NOTE — Telephone Encounter (Signed)
 Copied from CRM #[id number]. Topic: Clinical - Medication Refill >> Jul 25, 2024  3:44 PM Ashley R wrote: Medication: Semaglutide ,0.25 or 0.5MG /DOS, (OZEMPIC , 0.25 OR 0.5 MG/DOSE,) 2 MG/3ML SOPN  Has the patient contacted their pharmacy? Yes  This is the patient's preferred pharmacy:  [facility] [phone] [facility],  - [address] [place] [address] Phone: [phone] Fax: [phone]  Is this the correct pharmacy for this prescription? Yes If no, delete pharmacy and type the correct one.   Has the prescription been filled recently? Yes  Is the patient out of the medication? Yes  Has the patient been seen for an appointment in the last year OR does the patient have an upcoming appointment? Yes  Can we respond through MyChart? Yes  Agent: Please be advised that Rx refills may take up to 3 business days. We ask that you follow-up with your pharmacy. >> Jul 30, 2024  1:05 PM Emylou G wrote: Patient called.. checking status of refill?  She is afraid to skip doses. - Pls call patient on update

## 2024-08-02 ENCOUNTER — Encounter: Payer: Self-pay | Admitting: Internal Medicine

## 2024-08-02 ENCOUNTER — Ambulatory Visit: Admitting: Internal Medicine

## 2024-08-02 VITALS — BP 120/80 | HR 89 | Temp 98.1°F | Ht 65.0 in | Wt 164.8 lb

## 2024-08-02 DIAGNOSIS — G4733 Obstructive sleep apnea (adult) (pediatric): Secondary | ICD-10-CM | POA: Diagnosis not present

## 2024-08-02 DIAGNOSIS — J4520 Mild intermittent asthma, uncomplicated: Secondary | ICD-10-CM

## 2024-08-02 DIAGNOSIS — Z87891 Personal history of nicotine dependence: Secondary | ICD-10-CM | POA: Diagnosis not present

## 2024-08-02 DIAGNOSIS — J309 Allergic rhinitis, unspecified: Secondary | ICD-10-CM | POA: Diagnosis not present

## 2024-08-02 MED ORDER — TRELEGY ELLIPTA 100-62.5-25 MCG/ACT IN AEPB: 1.0000 | INHALATION_SPRAY | Freq: Every day | RESPIRATORY_TRACT | Status: DC

## 2024-08-02 NOTE — Patient Instructions (Signed)
 Congratulations on weight loss!!!!!  GOLD STAR!!!!   Continue to use Trelegy and albuterol  as needed Avoid Allergens and Irritants Avoid secondhand smoke Avoid SICK contacts Recommend  Masking  when appropriate Recommend Keep up-to-date with vaccinations

## 2024-08-02 NOTE — Progress Notes (Signed)
 Name: Caroline [name] MRN: [id number] DOB: Oct 06, 1960    CHIEF COMPLAINT:  Follow-up assessment for shortness of breath Follow-up assessment for nocturnal hypoxia Follow-up assessment lung cancer screening program    HISTORY OF PRESENT ILLNESS: Previous OSA assessment HST performed shows AHI 5.4 Patient has very mild form of OSA will plan to stop CPAP therapy And lost approximately 20 pounds over the last 3 months Patient sleeping much better  Former smoker quit 2015 1 pack a day for 30 years Patient uses albuterol  as needed Patient does not have any maintenance inhaler therapy Since losing weight patient has not used her inhaler therapy Patient uses albuterol  and Trelegy as needed  Assessment of lung cancer screening Low-dose CT March 2025 No significant findings Continue with lung cancer screening protocol  No exacerbation at this time No evidence of heart failure at this time No evidence or signs of infection at this time No respiratory distress No fevers, chills, nausea, vomiting, diarrhea No evidence of lower extremity edema No evidence hemoptysis  PAST MEDICAL HISTORY :   has a past medical history of Adnexal mass (02/21/2020), Allergy, CAD (coronary artery disease), Closed fracture of right humerus with routine healing (02/06/2013), Coronary vasospasm, Environmental and seasonal allergies, Esophagitis, GERD (gastroesophageal reflux disease), Hyperlipidemia, Hypertension, Hypothyroidism, IBS (irritable bowel syndrome), Palpitations, and Right ovarian cyst.  has a past surgical history that includes Appendectomy (1976); Upper gastrointestinal endoscopy (01/11/2011); left heart catheterization with coronary angiogram (N/A, 09/14/2011); Breast excisional biopsy (Right); Cardiac catheterization (2011); Cardiac catheterization (05/2011); Breast surgery (Right, 2002); Cholecystectomy (1982); Laparoscopic salpingo oophorectomy (Right, 09/18/2020); LEFT HEART CATH AND CORONARY  ANGIOGRAPHY (N/A, 10/08/2021); and Intracapsular cataract extraction (Right). Prior to Admission medications   Medication Sig Start Date End Date Taking? Authorizing Provider  acetaminophen  (TYLENOL ) 500 MG tablet Take 500 mg by mouth every 6 (six) hours as needed. For pain    [provider]  albuterol  (VENTOLIN  HFA) 108 (90 Base) MCG/ACT inhaler Inhale 2 puffs into the lungs every 6 (six) hours as needed. 05/30/23   Clark, Katherine K, NP  aspirin  81 MG tablet Take 81 mg by mouth daily.    [provider]  atorvastatin  (LIPITOR) 20 MG tablet Take 1 tablet (20 mg total) by mouth daily. 05/09/23   Wyn Jackee VEAR Mickey., NP  Cholecalciferol (VITAMIN D ) 2000 units CAPS Take 2000 units once per day. 03/18/16   Burchette, Wolm ORN, MD  Cyanocobalamin (VITAMIN B-12 PO) Take by mouth.    [provider]  diclofenac  Sodium (VOLTAREN ) 1 % GEL Apply 2-4 g topically 4 (four) times daily. 02/14/20   Hilts, Ozell, MD  diltiazem  (TIADYLT  ER) 300 MG 24 hr capsule TAKE 1 CAPSULE BY MOUTH EVERY DAY 05/18/23   Verlin Lonni BIRCH, MD  DULoxetine  (CYMBALTA ) 60 MG capsule Take 1 capsule (60 mg total) by mouth daily. For anxiety, depression, pain 10/21/22   Clark, Katherine K, NP  fluticasone  (FLONASE ) 50 MCG/ACT nasal spray Place 2 sprays into both nostrils daily. Patient taking differently: Place 2 sprays into both nostrils daily as needed for allergies. 04/07/18   Avram Barnie NOVAK, FNP  isosorbide  dinitrate (ISORDIL ) 30 MG tablet TAKE 1 TABLET BY MOUTH THREE TIMES A DAY NEED OFFICE VISIT 05/03/23   Verlin Lonni BIRCH, MD  levocetirizine (XYZAL ) 5 MG tablet Take 1 tablet (5 mg total) by mouth every evening. For allergies 10/21/22   Clark, Katherine K, NP  levothyroxine  (SYNTHROID ) 50 MCG tablet TAKE 1 TABLET BY MOUTH EVERY MORNING ON EMPTY STOMACH WITH  WATER NO FOOD OR OTHER MEDS FOR 12/19/22   Clark, Katherine K, NP  meloxicam  (MOBIC ) 15 MG tablet TAKE 1 TABLET (15 MG TOTAL) BY MOUTH  DAILY. 11/18/22   Vernetta Lonni GRADE, MD  nitroGLYCERIN  (NITROSTAT ) 0.4 MG SL tablet PLACE 1 TABLET (0.4 MG TOTAL) UNDER THE TONGUE EVERY 5 MINUTES AS NEEDED FOR CHEST PAIN. X 3 DOSES.  Please call [phone] to schedule an appointment for future refills. Thank you. 08/12/22   Verlin Lonni BIRCH, MD  Polyethyl Glycol-Propyl Glycol (SYSTANE) 0.4-0.3 % SOLN Place 1 drop into both eyes in the morning, at noon, in the evening, and at bedtime.    [provider]   Allergies  Allergen Reactions   Octacosanol Itching    FAMILY HISTORY:  family history includes Anxiety disorder in her son; Breast cancer (age of onset: 15) in her mother; Colon cancer in an other family member; Depression in her son; Diabetes in her mother; Heart disease in her father and mother; Hyperlipidemia in her father; Prostate cancer in her father; Thyroid  disease in her maternal grandmother. SOCIAL HISTORY:  reports that she quit smoking about 10 years ago. Her smoking use included cigarettes. She started smoking about 40 years ago. She has a 7.5 pack-year smoking history. She has never used smokeless tobacco. She reports current alcohol use of about 2.0 standard drinks of alcohol per week. She reports that she does not use drugs.  BP 120/80   Pulse 89   Temp 98.1 F (36.7 C)   Ht 5' 5 (1.651 m)   Wt 164 lb 12.8 oz (74.8 kg)   LMP 07/28/2010   SpO2 93%   BMI 27.42 kg/m   No distress Lungs are clear no wheezing no rhonchi S1-S2 no murmurs Pulses intact No focal deficits      ASSESSMENT AND PLAN SYNOPSIS  64 year old white female seen today for follow-up assessment for OSA  Home sleep study recently obtained shows AHI of 5.4, very mild form of sleep apnea we will plan to stop CPAP at this time and recommend weight loss  Patient with increased shortness of breath and wheezing due to weather however patient with probable underlying diagnosis of COPD   After losing a significant amount of  weight over the last several months patient has no significant symptoms of sleep apnea and no significant respiratory symptoms Patient uses Trelegy and albuterol  as needed  Continue weight loss journey   Allergic rhinitis sinus congestion ENT referral-no nasal polyps however allergy testing is positive for dog dander Patient using  Astelin  nasal sprays which seem to be helping   OSA assessment Very mild form will plan to stop CPAP Recommend weight loss next weight goal is 171 pounds   Former smoker low-dose CT scan March 2025 No significant findings follow-up annually  MEDICATION ADJUSTMENTS/LABS AND TESTS ORDERED: Continue weight loss. Continue lung cancer screening program Use inhalers as needed    CURRENT MEDICATIONS REVIEWED AT LENGTH WITH PATIENT TODAY   Patient  satisfied with Plan of action and management. All questions answered   Follow up 6 months   I spent a total of 45 minutes dedicated to the care of this patient on the date of this encounter to include pre-visit review of records, face-to-face time with the patient discussing conditions above, post visit ordering of testing, clinical documentation with the electronic health record, making appropriate referrals as documented, and communicating necessary information to the patient's healthcare team.    The Patient requires high complexity decision  making for assessment and support, frequent evaluation and titration of therapies, application of advanced monitoring technologies and extensive interpretation of multiple databases.  Patient satisfied with Plan of action and management. All questions answered    Nickolas Alm Cellar, M.D.  [facility] Pulmonary & Critical Care Medicine  Medical Director [facility]

## 2024-08-07 DIAGNOSIS — Z713 Dietary counseling and surveillance: Secondary | ICD-10-CM | POA: Diagnosis not present

## 2024-08-08 ENCOUNTER — Ambulatory Visit: Admitting: Primary Care

## 2024-08-08 ENCOUNTER — Encounter: Payer: Self-pay | Admitting: Primary Care

## 2024-08-08 VITALS — BP 136/66 | HR 63 | Temp 97.9°F | Ht 63.0 in | Wt 166.4 lb

## 2024-08-08 DIAGNOSIS — E1165 Type 2 diabetes mellitus with hyperglycemia: Secondary | ICD-10-CM | POA: Diagnosis not present

## 2024-08-08 DIAGNOSIS — Z23 Encounter for immunization: Secondary | ICD-10-CM | POA: Diagnosis not present

## 2024-08-08 DIAGNOSIS — Z7985 Long-term (current) use of injectable non-insulin antidiabetic drugs: Secondary | ICD-10-CM | POA: Diagnosis not present

## 2024-08-08 LAB — MICROALBUMIN / CREATININE URINE RATIO
Creatinine,U: 50.2 mg/dL
Microalb Creat Ratio: 46.9 mg/g — ABNORMAL HIGH (ref 0.0–30.0)
Microalb, Ur: 2.4 mg/dL — ABNORMAL HIGH (ref 0.0–1.9)

## 2024-08-08 LAB — POCT GLYCOSYLATED HEMOGLOBIN (HGB A1C): Hemoglobin A1C: 5.4 % (ref 4.0–5.6)

## 2024-08-08 NOTE — Addendum Note (Signed)
 Addended by: Lycan Davee on: 08/08/2024 09:11 AM   Modules accepted: Orders

## 2024-08-08 NOTE — Assessment & Plan Note (Addendum)
 Improved and controlled with A1C of 5.4 today!  Continue Ozempic  0.5 mg weekly.   Pneumonia vaccine provided today. Foot exam today. Urine microalbumin pending  Follow up in 6 months.

## 2024-08-08 NOTE — Patient Instructions (Addendum)
 Stop by the lab prior to leaving today. I will notify you of your results once received.   Continue Ozempic  0.5 mg weekly for diabetes.  Please schedule a follow up visit for 6 months for a diabetes check.  It was a pleasure to see you today!

## 2024-08-08 NOTE — Progress Notes (Signed)
 Subjective:    Patient ID: Caroline [name], female    DOB: 07/08/1961, 63 y.o.   MRN: [id number]  Caroline [name] is a very pleasant 63 y.o. female with a history of new onset type 2 diabetes, CAD with MI, OSA, hypertension, hypothyroidism, who presents today for follow up of diabetes.  1) Type 2 Diabetes:  Current medications include: Ozempic  0.5 mg weekly. Ozempic  never reduced her appetite, but she's able to control her appetite with healthier snacks.   She is checking her blood glucose several times weekly and is getting readings of: low 100s.   Last A1C: 6.5 in September 2025, 5.4 today Last Eye Exam: UTD Last Foot Exam: UTD Pneumonia Vaccination: Never completed  Urine Microalbumin: Due Statin: atorvastatin    Dietary changes since last visit: Protein shakes, increased protein, increased intake of salad and veggies. Reduced intake of sugar. She is meeting with a nutritionist regularly.   Exercise: Active   BP Readings from Last 3 Encounters:  08/08/24 136/66  08/02/24 120/80  05/11/24 138/82    Wt Readings from Last 3 Encounters:  08/08/24 166 lb 6 oz (75.5 kg)  08/02/24 164 lb 12.8 oz (74.8 kg)  05/11/24 181 lb (82.1 kg)      Review of Systems  Respiratory:  Negative for shortness of breath.   Cardiovascular:  Negative for chest pain.  Neurological:  Negative for dizziness and numbness.         Past Medical History:  Diagnosis Date   Adnexal mass 02/21/2020   Allergy    Seasonal   CAD (coronary artery disease)    has documented coronary vasospasm but with underlying 40% prox LAD, 30% prox LCX and 70 to 80% proximal 1st DX and 30% RCA; recurrent vasospasm with subsequent cath in Jan 2013.    Closed fracture of right humerus with routine healing 02/06/2013   Coronary vasospasm    Environmental and seasonal allergies    Esophagitis    GERD (gastroesophageal reflux disease)    Hyperlipidemia    Hypertension    Hypothyroidism    IBS (irritable bowel  syndrome)    Palpitations    tachycardia on holter; treated with beta blockers   Right ovarian cyst     Social History   Socioeconomic History   Marital status: Married    Spouse name: Not on file   Number of children: 2   Years of education: Not on file   Highest education level: Associate degree: occupational, scientist, product/process development, or vocational program  Occupational History   Occupation: Unemployed  Tobacco Use   Smoking status: Former    Current packs/day: 0.00    Average packs/day: 0.3 packs/day for 30.0 years (7.5 ttl pk-yrs)    Types: Cigarettes    Start date: 05/30/1984    Quit date: 05/30/2014    Years since quitting: 10.2   Smokeless tobacco: Never  Vaping Use   Vaping status: Never Used  Substance and Sexual Activity   Alcohol use: Yes    Alcohol/week: 2.0 standard drinks of alcohol    Types: 2 Standard drinks or equivalent per week    Comment: occ   Drug use: No   Sexual activity: Yes  Other Topics Concern   Not on file  Social History Narrative   Married.   2 children.   Retired, once worked in Chief Financial Officer.   She enjoys being with her dogs, shopping.    Social Drivers of Health   Financial Resource Strain: Low Risk  (05/10/2024)   Overall  Financial Resource Strain (CARDIA)    Difficulty of Paying Living Expenses: Not hard at all  Food Insecurity: No Food Insecurity (05/10/2024)   Hunger Vital Sign    Worried About Running Out of Food in the Last Year: Never true    Ran Out of Food in the Last Year: Never true  Transportation Needs: No Transportation Needs (05/10/2024)   PRAPARE - Administrator, Civil Service (Medical): No    Lack of Transportation (Non-Medical): No  Physical Activity: Sufficiently Active (05/10/2024)   Exercise Vital Sign    Days of Exercise per Week: 5 days    Minutes of Exercise per Session: 30 min  Stress: No Stress Concern Present (05/10/2024)   [employer] of Occupational Health - Occupational Stress Questionnaire     Feeling of Stress: Not at all  Social Connections: Unknown (05/10/2024)   Social Connection and Isolation Panel    Frequency of Communication with Friends and Family: Once a week    Frequency of Social Gatherings with Friends and Family: Patient declined    Attends Religious Services: Patient declined    Database Administrator or Organizations: No    Attends Engineer, Structural: Not on file    Marital Status: Married  Catering Manager Violence: Not on file    Past Surgical History:  Procedure Laterality Date   APPENDECTOMY  1976   BREAST EXCISIONAL BIOPSY Right    benign   BREAST SURGERY Right 2002   biopsy   CARDIAC CATHETERIZATION  2011   CARDIAC CATHETERIZATION  05/2011   With documented vasospasm but with residual disease; managed medically   CHOLECYSTECTOMY  1982   open   INTRACAPSULAR CATARACT EXTRACTION Right    LAPAROSCOPIC SALPINGO OOPHERECTOMY Right 09/18/2020   Procedure: LAPAROSCOPIC SALPINGO OOPHORECTOMY;  Surgeon: Kandyce Sor, MD;  Location: [facility];  Service: Gynecology;  Laterality: Right;   LEFT HEART CATH AND CORONARY ANGIOGRAPHY N/A 10/08/2021   Procedure: LEFT HEART CATH AND CORONARY ANGIOGRAPHY;  Surgeon: Verlin Lonni BIRCH, MD;  Location: [facility];  Service: Cardiovascular;  Laterality: N/A;   LEFT HEART CATHETERIZATION WITH CORONARY ANGIOGRAM N/A 09/14/2011   Procedure: LEFT HEART CATHETERIZATION WITH CORONARY ANGIOGRAM;  Surgeon: Lonni BIRCH Verlin, MD;  Location: [facility] CATH LAB;  Service: Cardiovascular;  Laterality: N/A;   UPPER GASTROINTESTINAL ENDOSCOPY  01/11/2011   esophagitis    Family History  Problem Relation Age of Onset   Breast cancer Mother 47   Heart disease Mother        died CHF 60   Diabetes Mother    Prostate cancer Father    Hyperlipidemia Father    Heart disease Father        s/p cabg - alive   Colon cancer Other        First cousin on dads side    Thyroid  disease Maternal  Grandmother    Anxiety disorder Son    Depression Son     Allergies  Allergen Reactions   Octacosanol Itching    Current Outpatient Medications on File Prior to Visit  Medication Sig Dispense Refill   acetaminophen  (TYLENOL ) 500 MG tablet Take 500 mg by mouth every 6 (six) hours as needed. For pain     albuterol  (VENTOLIN  HFA) 108 (90 Base) MCG/ACT inhaler Inhale 2 puffs into the lungs every 6 (six) hours as needed. 8.5 each 11   aspirin  81 MG tablet Take 81 mg by mouth daily.     atorvastatin  (  LIPITOR) 20 MG tablet Take 1 tablet (20 mg total) by mouth daily. 90 tablet 0   azelastine  (ASTELIN ) 0.1 % nasal spray Place 2 sprays into both nostrils 2 (two) times daily. Use in each nostril as directed 30 mL 12   Cholecalciferol (VITAMIN D ) 2000 units CAPS Take 2000 units once per day. 30 capsule    clobetasol cream (TEMOVATE) 0.05 % Apply 1 Application topically 2 (two) times daily.     Cyanocobalamin (VITAMIN B-12 PO) Take by mouth.     diltiazem  (TIADYLT  ER) 300 MG 24 hr capsule Take 1 capsule (300 mg total) by mouth daily. 90 capsule 0   DULoxetine  (CYMBALTA ) 60 MG capsule TAKE 1 CAPSULE (60 MG TOTAL) BY MOUTH DAILY. FOR ANXIETY, DEPRESSION, PAIN 90 capsule 2   FLUARIX 0.5 ML injection Inject 0.5 mLs into the muscle once.     fluticasone  (FLONASE ) 50 MCG/ACT nasal spray Place 2 sprays into both nostrils daily. 16 g 6   Fluticasone -Umeclidin-Vilant (TRELEGY ELLIPTA ) 100-62.5-25 MCG/ACT AEPB Inhale 1 puff into the lungs daily. (Patient taking differently: Inhale 1 puff into the lungs daily. As needed) 28 each 3   Fluticasone -Umeclidin-Vilant (TRELEGY ELLIPTA ) 100-62.5-25 MCG/ACT AEPB Inhale 1 puff into the lungs daily.     isosorbide  dinitrate (ISORDIL ) 30 MG tablet Take 1 tablet (30 mg total) by mouth 3 (three) times daily. 270 tablet 0   levocetirizine (XYZAL ) 5 MG tablet Take 1 tablet (5 mg total) by mouth every evening. For allergies 90 tablet 3   levothyroxine  (SYNTHROID ) 50 MCG tablet  TAKE 1 TABLET BY MOUTH EVERY MORNING ON EMPTY STOMACH WITH WATER NO FOOD OR OTHER MEDS FOR 90 tablet 2   nitroGLYCERIN  (NITROSTAT ) 0.4 MG SL tablet PLACE 1 TABLET (0.4 MG TOTAL) UNDER THE TONGUE EVERY 5 MINUTES AS NEEDED FOR CHEST PAIN. X 3 DOSES. 75 tablet 0   Polyethyl Glycol-Propyl Glycol (SYSTANE) 0.4-0.3 % SOLN Place 1 drop into both eyes in the morning, at noon, in the evening, and at bedtime.     Semaglutide ,0.25 or 0.5MG /DOS, (OZEMPIC , 0.25 OR 0.5 MG/DOSE,) 2 MG/3ML SOPN Inject 0.5 mg into the skin once a week. for diabetes. 2 mL 0   No current facility-administered medications on file prior to visit.    BP 136/66   Pulse 63   Temp 97.9 F (36.6 C) (Oral)   Ht 5' 3 (1.6 m)   Wt 166 lb 6 oz (75.5 kg)   LMP 07/28/2010   SpO2 98%   BMI 29.47 kg/m  Objective:   Physical Exam Cardiovascular:     Rate and Rhythm: Normal rate and regular rhythm.  Pulmonary:     Effort: Pulmonary effort is normal.     Breath sounds: Normal breath sounds.  Musculoskeletal:     Cervical back: Neck supple.  Skin:    General: Skin is warm and dry.  Neurological:     Mental Status: She is alert and oriented to person, place, and time.  Psychiatric:        Mood and Affect: Mood normal.     Physical Exam        Assessment & Plan:  Type 2 diabetes mellitus with hyperglycemia, without long-term current use of insulin (HCC) Assessment & Plan: Improved and controlled with A1C of 5.4 today!  Continue Ozempic  0.5 mg weekly.   Pneumonia vaccine provided today. Foot exam today. Urine microalbumin pending  Follow up in 6 months.   Orders: -     POCT glycosylated hemoglobin (Hb  A1C) -     Microalbumin / creatinine urine ratio    Assessment and Plan Assessment & Plan         Comer MARLA Gaskins, NP

## 2024-08-09 ENCOUNTER — Ambulatory Visit: Payer: Self-pay | Admitting: Primary Care

## 2024-08-09 DIAGNOSIS — E1165 Type 2 diabetes mellitus with hyperglycemia: Secondary | ICD-10-CM

## 2024-08-10 ENCOUNTER — Other Ambulatory Visit: Payer: Self-pay | Admitting: Cardiovascular Disease

## 2024-08-10 ENCOUNTER — Other Ambulatory Visit: Payer: Self-pay | Admitting: Primary Care

## 2024-08-10 DIAGNOSIS — M797 Fibromyalgia: Secondary | ICD-10-CM

## 2024-08-10 DIAGNOSIS — F419 Anxiety disorder, unspecified: Secondary | ICD-10-CM

## 2024-08-22 ENCOUNTER — Other Ambulatory Visit: Payer: Self-pay | Admitting: Primary Care

## 2024-08-22 DIAGNOSIS — E1165 Type 2 diabetes mellitus with hyperglycemia: Secondary | ICD-10-CM

## 2024-08-25 ENCOUNTER — Other Ambulatory Visit: Payer: Self-pay | Admitting: Primary Care

## 2024-08-25 DIAGNOSIS — E039 Hypothyroidism, unspecified: Secondary | ICD-10-CM

## 2024-09-06 ENCOUNTER — Other Ambulatory Visit: Payer: Self-pay | Admitting: Cardiovascular Disease

## 2024-09-08 ENCOUNTER — Other Ambulatory Visit: Payer: Self-pay | Admitting: Cardiovascular Disease

## 2024-09-17 ENCOUNTER — Encounter: Payer: Self-pay | Admitting: Cardiovascular Disease

## 2024-09-17 ENCOUNTER — Ambulatory Visit: Attending: Cardiovascular Disease | Admitting: Cardiovascular Disease

## 2024-09-17 VITALS — BP 119/50 | HR 94 | Ht 61.0 in | Wt 161.7 lb

## 2024-09-17 DIAGNOSIS — I201 Angina pectoris with documented spasm: Secondary | ICD-10-CM | POA: Diagnosis not present

## 2024-09-17 DIAGNOSIS — I2510 Atherosclerotic heart disease of native coronary artery without angina pectoris: Secondary | ICD-10-CM | POA: Diagnosis not present

## 2024-09-17 DIAGNOSIS — E7800 Pure hypercholesterolemia, unspecified: Secondary | ICD-10-CM

## 2024-09-17 DIAGNOSIS — I25111 Atherosclerotic heart disease of native coronary artery with angina pectoris with documented spasm: Secondary | ICD-10-CM | POA: Diagnosis not present

## 2024-09-17 MED ORDER — DILTIAZEM HCL ER BEADS 300 MG PO CP24: 300.0000 mg | ORAL_CAPSULE | Freq: Every day | ORAL | 3 refills | Status: AC

## 2024-09-17 MED ORDER — ISOSORBIDE DINITRATE 30 MG PO TABS: 30.0000 mg | ORAL_TABLET | Freq: Three times a day (TID) | ORAL | 3 refills | Status: AC

## 2024-09-17 MED ORDER — ATORVASTATIN CALCIUM 20 MG PO TABS: 20.0000 mg | ORAL_TABLET | Freq: Every day | ORAL | 3 refills | Status: AC

## 2024-09-17 NOTE — Progress Notes (Signed)
 "   Chief Complaint  Patient presents with   Follow-up    CAD   History of Present Illness: 64 yo female with history of CAD, Prinzmetal angina, HLD, IBS and DM who is here today for cardiac follow up. She had been followed by Dr. Dominick prior to his retirement. She has Prinzmetal angina controlled with Diltiazem  and Isordil . Last cardiac cath in January 2013 at which time she was noted to have spasm in the ostial LAD with mild calcification and mild disease in the Diagonal, Circumflex and RCA. LVEF=55% by cath. ABI normal September 2017. Nuclear stress test in February 2018 with no ischemia. Cardiac cath February 2023 with mild disease in the LAD, Circumflex and RCA.   She is here today for follow up. The patient denies any chest pain, dyspnea, palpitations, lower extremity edema, orthopnea, PND, dizziness, near syncope or syncope. She has been exercising.   Primary Care Physician: Gretta Comer POUR, NP  Past Medical History:  Diagnosis Date   Adnexal mass 02/21/2020   Allergy    Seasonal   CAD (coronary artery disease)    has documented coronary vasospasm but with underlying 40% prox LAD, 30% prox LCX and 70 to 80% proximal 1st DX and 30% RCA; recurrent vasospasm with subsequent cath in Jan 2013.    Closed fracture of right humerus with routine healing 02/06/2013   Coronary vasospasm    Environmental and seasonal allergies    Esophagitis    GERD (gastroesophageal reflux disease)    Hyperlipidemia    Hypertension    Hypothyroidism    IBS (irritable bowel syndrome)    Palpitations    tachycardia on holter; treated with beta blockers   Right ovarian cyst     Past Surgical History:  Procedure Laterality Date   APPENDECTOMY  1976   BREAST EXCISIONAL BIOPSY Right    benign   BREAST SURGERY Right 2002   biopsy   CARDIAC CATHETERIZATION  2011   CARDIAC CATHETERIZATION  05/2011   With documented vasospasm but with residual disease; managed medically   CHOLECYSTECTOMY  1982    open   INTRACAPSULAR CATARACT EXTRACTION Right    LAPAROSCOPIC SALPINGO OOPHERECTOMY Right 09/18/2020   Procedure: LAPAROSCOPIC SALPINGO OOPHORECTOMY;  Surgeon: Kandyce Sor, MD;  Location: [facility];  Service: Gynecology;  Laterality: Right;   LEFT HEART CATH AND CORONARY ANGIOGRAPHY N/A 10/08/2021   Procedure: LEFT HEART CATH AND CORONARY ANGIOGRAPHY;  Surgeon: Verlin Lonni BIRCH, MD;  Location: [facility];  Service: Cardiovascular;  Laterality: N/A;   LEFT HEART CATHETERIZATION WITH CORONARY ANGIOGRAM N/A 09/14/2011   Procedure: LEFT HEART CATHETERIZATION WITH CORONARY ANGIOGRAM;  Surgeon: Lonni BIRCH Verlin, MD;  Location: [facility] CATH LAB;  Service: Cardiovascular;  Laterality: N/A;   UPPER GASTROINTESTINAL ENDOSCOPY  01/11/2011   esophagitis    Current Outpatient Medications  Medication Sig Dispense Refill   acetaminophen  (TYLENOL ) 500 MG tablet Take 500 mg by mouth every 6 (six) hours as needed. For pain     albuterol  (VENTOLIN  HFA) 108 (90 Base) MCG/ACT inhaler Inhale 2 puffs into the lungs every 6 (six) hours as needed. 8.5 each 11   aspirin  81 MG tablet Take 81 mg by mouth daily.     azelastine  (ASTELIN ) 0.1 % nasal spray Place 2 sprays into both nostrils 2 (two) times daily. Use in each nostril as directed 30 mL 12   Cholecalciferol (VITAMIN D ) 2000 units CAPS Take 2000 units once per day. 30 capsule    clobetasol  cream (TEMOVATE) 0.05 % Apply 1 Application topically 2 (two) times daily.     Cyanocobalamin  (VITAMIN B-12 PO) Take by mouth.     DULoxetine  (CYMBALTA ) 60 MG capsule TAKE 1 CAPSULE (60 MG TOTAL) BY MOUTH DAILY. FOR ANXIETY, DEPRESSION, PAIN 90 capsule 1   FLUARIX 0.5 ML injection Inject 0.5 mLs into the muscle once.     Fluticasone -Umeclidin-Vilant (TRELEGY ELLIPTA ) 100-62.5-25 MCG/ACT AEPB Inhale 1 puff into the lungs daily. (Patient taking differently: Inhale 1 puff into the lungs daily. As needed) 28 each 3   levocetirizine (XYZAL ) 5 MG  tablet Take 1 tablet (5 mg total) by mouth every evening. For allergies 90 tablet 3   levothyroxine  (SYNTHROID ) 50 MCG tablet TAKE 1 TABLET BY MOUTH EVERY MORNING ON EMPTY STOMACH WITH WATER NO FOOD OR OTHER MEDS FOR 90 tablet 1   nitroGLYCERIN  (NITROSTAT ) 0.4 MG SL tablet PLACE 1 TABLET (0.4 MG TOTAL) UNDER THE TONGUE EVERY 5 MINUTES AS NEEDED FOR CHEST PAIN. X 3 DOSES. 75 tablet 0   Polyethyl Glycol-Propyl Glycol (SYSTANE) 0.4-0.3 % SOLN Place 1 drop into both eyes in the morning, at noon, in the evening, and at bedtime.     Semaglutide ,0.25 or 0.5MG /DOS, (OZEMPIC , 0.25 OR 0.5 MG/DOSE,) 2 MG/3ML SOPN INJECT 0.5 MG INTO THE SKIN ONCE A WEEK. FOR DIABETES. 9 mL 1   atorvastatin  (LIPITOR) 20 MG tablet Take 1 tablet (20 mg total) by mouth daily. 90 tablet 3   diltiazem  (TIADYLT  ER) 300 MG 24 hr capsule Take 1 capsule (300 mg total) by mouth daily. 90 capsule 3   isosorbide  dinitrate (ISORDIL ) 30 MG tablet Take 1 tablet (30 mg total) by mouth 3 (three) times daily. 270 tablet 3   No current facility-administered medications for this visit.    Allergies  Allergen Reactions   Octacosanol Itching    Social History   Socioeconomic History   Marital status: Married    Spouse name: Not on file   Number of children: 2   Years of education: Not on file   Highest education level: Associate degree: occupational, scientist, product/process development, or vocational program  Occupational History   Occupation: Unemployed  Tobacco Use   Smoking status: Former    Current packs/day: 0.00    Average packs/day: 0.3 packs/day for 30.0 years (7.5 ttl pk-yrs)    Types: Cigarettes    Start date: 05/30/1984    Quit date: 05/30/2014    Years since quitting: 10.3   Smokeless tobacco: Never  Vaping Use   Vaping status: Never Used  Substance and Sexual Activity   Alcohol use: Yes    Alcohol/week: 2.0 standard drinks of alcohol    Types: 2 Standard drinks or equivalent per week    Comment: occ   Drug use: No   Sexual  activity: Yes  Other Topics Concern   Not on file  Social History Narrative   Married.   2 children.   Retired, once worked in Chief Financial Officer.   She enjoys being with her dogs, shopping.    Social Drivers of Health   Tobacco Use: Medium Risk (09/17/2024)   Patient History    Smoking Tobacco Use: Former    Smokeless Tobacco Use: Never    Passive Exposure: Not on file  Financial Resource Strain: Low Risk (05/10/2024)   Overall Financial Resource Strain (CARDIA)    Difficulty of Paying Living Expenses: Not hard at all  Food Insecurity: No Food Insecurity (05/10/2024)   Epic    Worried About Running Out  of Food in the Last Year: Never true    Ran Out of Food in the Last Year: Never true  Transportation Needs: No Transportation Needs (05/10/2024)   Epic    Lack of Transportation (Medical): No    Lack of Transportation (Non-Medical): No  Physical Activity: Sufficiently Active (05/10/2024)   Exercise Vital Sign    Days of Exercise per Week: 5 days    Minutes of Exercise per Session: 30 min  Stress: No Stress Concern Present (05/10/2024)   [employer] of Occupational Health - Occupational Stress Questionnaire    Feeling of Stress: Not at all  Social Connections: Unknown (05/10/2024)   Social Connection and Isolation Panel    Frequency of Communication with Friends and Family: Once a week    Frequency of Social Gatherings with Friends and Family: Patient declined    Attends Religious Services: Patient declined    Database Administrator or Organizations: No    Attends Engineer, Structural: Not on file    Marital Status: Married  Catering Manager Violence: Not on file  Depression (PHQ2-9): Low Risk (08/08/2024)   Depression (PHQ2-9)    PHQ-2 Score: 0  Alcohol Screen: Low Risk (05/10/2024)   Alcohol Screen    Last Alcohol Screening Score (AUDIT): 2  Housing: Low Risk (05/10/2024)   Epic    Unable to Pay for Housing in the Last Year: No    Number of Times Moved in the Last  Year: 0    Homeless in the Last Year: No  Utilities: Not on file  Health Literacy: Not on file    Family History  Problem Relation Age of Onset   Breast cancer Mother 38   Heart disease Mother        died CHF 73   Diabetes Mother    Prostate cancer Father    Hyperlipidemia Father    Heart disease Father        s/p cabg - alive   Colon cancer Other        First cousin on dads side    Thyroid  disease Maternal Grandmother    Anxiety disorder Son    Depression Son     Review of Systems:  As stated in the HPI and otherwise negative.   BP (!) 119/50   Pulse 94   Ht 5' 1 (1.549 m)   Wt 161 lb 11.2 oz (73.3 kg)   LMP 07/28/2010   SpO2 95%   BMI 30.55 kg/m   Physical Examination: General: Well developed, well nourished, NAD  SKIN: warm, dry. Neuro: No focal deficits  Psychiatric: Mood and affect normal  Neck: No JVD Lungs:Clear bilaterally, no wheezes, rhonci, crackles Cardiovascular: Regular rate and rhythm. No murmurs, gallops or rubs. Abdomen:Soft.  Extremities: No lower extremity edema.    EKG:  EKG is ordered today. The ekg ordered today demonstrates EKG Interpretation Date/Time:  Monday September 17 2024 15:43:40 EST Ventricular Rate:  94 PR Interval:  170 QRS Duration:  74 QT Interval:  352 QTC Calculation: 440 R Axis:   0  Text Interpretation: Normal sinus rhythm Poor R wave progression Confirmed by Verlin Bruckner [phone]) on 09/17/2024 3:57:19 PM   Recent Labs: 10/24/2023: ALT 22; BUN 14; Creatinine, Ser 0.67; Hemoglobin 15.1; Platelets 218.0; Potassium 4.9; Sodium 139 05/11/2024: TSH 1.63   Lipid Panel    Component Value Date/Time   CHOL 182 10/24/2023 1037   CHOL 168 03/13/2019 1411   TRIG 65.0 10/24/2023 1037   HDL 95.80  10/24/2023 1037   HDL 89 03/13/2019 1411   CHOLHDL 2 10/24/2023 1037   VLDL 13.0 10/24/2023 1037   LDLCALC 73 10/24/2023 1037   LDLCALC 62 03/13/2019 1411     Wt Readings from Last 3 Encounters:  09/17/24 161 lb 11.2  oz (73.3 kg)  08/08/24 166 lb 6 oz (75.5 kg)  08/02/24 164 lb 12.8 oz (74.8 kg)    Assessment and Plan:   1. CAD without angina/coronary vasospasm: She has mild three vessel CAD by cath in 2023. She has had prior vasospasm noted by cardiac cath in 2013.  -Continue ASA, Lipitor, Imdur  and Cardizem   2. Hyperlipidemia: Lipids followed in primary care. LDL near goal in February 7974.  -Continue Lipitor.     Labs/ tests ordered today include:  Orders Placed This Encounter  Procedures   EKG 12-Lead   Disposition:   F/U with me in 12 months  Signed, Lonni Cash, MD 09/17/2024 4:26 PM    [facility] [address] [place], [place], [place]  [address] Phone: [phone]; Fax: [phone]  "

## 2024-09-17 NOTE — Patient Instructions (Signed)
 Medication Instructions:  Your physician recommends that you continue on your current medications as directed. Please refer to the Current Medication list given to you today.  *If you need a refill on your cardiac medications before your next appointment, please call your pharmacy*   Follow-Up: At [facility], you and your health needs are our priority.  As part of our continuing mission to provide you with exceptional heart care, our providers are all part of one team.  This team includes your primary Cardiologist (physician) and Advanced Practice Providers or APPs (Physician Assistants and Nurse Practitioners) who all work together to provide you with the care you need, when you need it.  Your next appointment:   1 year(s)  Provider:   Lonni Cash, MD

## 2025-02-06 ENCOUNTER — Ambulatory Visit: Admitting: Primary Care
